# Patient Record
Sex: Female | Born: 1955 | Race: Black or African American | Hispanic: No | Marital: Single | Smoking: Former smoker
Health system: Southern US, Community
[De-identification: ages and names within clinical notes are randomized; demographics above are authoritative.]

## PROBLEM LIST (undated history)

## (undated) DIAGNOSIS — Z9889 Other specified postprocedural states: Secondary | ICD-10-CM

## (undated) DIAGNOSIS — G709 Myoneural disorder, unspecified: Secondary | ICD-10-CM

## (undated) DIAGNOSIS — G43909 Migraine, unspecified, not intractable, without status migrainosus: Secondary | ICD-10-CM

## (undated) DIAGNOSIS — Z9289 Personal history of other medical treatment: Secondary | ICD-10-CM

## (undated) DIAGNOSIS — R351 Nocturia: Secondary | ICD-10-CM

## (undated) DIAGNOSIS — Z860100 Personal history of colon polyps, unspecified: Secondary | ICD-10-CM

## (undated) DIAGNOSIS — K219 Gastro-esophageal reflux disease without esophagitis: Secondary | ICD-10-CM

## (undated) DIAGNOSIS — F419 Anxiety disorder, unspecified: Secondary | ICD-10-CM

## (undated) DIAGNOSIS — Z8669 Personal history of other diseases of the nervous system and sense organs: Secondary | ICD-10-CM

## (undated) DIAGNOSIS — Z8709 Personal history of other diseases of the respiratory system: Secondary | ICD-10-CM

## (undated) DIAGNOSIS — I639 Cerebral infarction, unspecified: Secondary | ICD-10-CM

## (undated) DIAGNOSIS — I10 Essential (primary) hypertension: Secondary | ICD-10-CM

## (undated) DIAGNOSIS — M797 Fibromyalgia: Secondary | ICD-10-CM

## (undated) DIAGNOSIS — M1990 Unspecified osteoarthritis, unspecified site: Secondary | ICD-10-CM

## (undated) DIAGNOSIS — I499 Cardiac arrhythmia, unspecified: Secondary | ICD-10-CM

## (undated) DIAGNOSIS — T8859XA Other complications of anesthesia, initial encounter: Secondary | ICD-10-CM

## (undated) DIAGNOSIS — R112 Nausea with vomiting, unspecified: Secondary | ICD-10-CM

## (undated) DIAGNOSIS — M2550 Pain in unspecified joint: Secondary | ICD-10-CM

## (undated) DIAGNOSIS — E785 Hyperlipidemia, unspecified: Secondary | ICD-10-CM

## (undated) HISTORY — DX: Personal history of other medical treatment: Z92.89

## (undated) HISTORY — DX: Cerebral infarction, unspecified: I63.9

## (undated) HISTORY — PX: COLONOSCOPY: SHX174

## (undated) HISTORY — PX: OTHER SURGICAL HISTORY: SHX169

## (undated) HISTORY — PX: CARPAL TUNNEL RELEASE: SHX101

## (undated) HISTORY — PX: BREAST EXCISIONAL BIOPSY: SUR124

## (undated) HISTORY — DX: Myoneural disorder, unspecified: G70.9

---

## 1995-10-30 HISTORY — PX: ABDOMINAL HYSTERECTOMY: SHX81

## 1996-10-29 HISTORY — PX: CHOLECYSTECTOMY: SHX55

## 1997-10-29 HISTORY — PX: KNEE ARTHROSCOPY: SUR90

## 1998-06-24 ENCOUNTER — Encounter: Payer: Self-pay | Admitting: Emergency Medicine

## 1998-06-24 ENCOUNTER — Emergency Department (HOSPITAL_COMMUNITY): Admission: EM | Admit: 1998-06-24 | Discharge: 1998-06-24 | Payer: Self-pay | Admitting: Emergency Medicine

## 1999-07-04 ENCOUNTER — Ambulatory Visit (HOSPITAL_BASED_OUTPATIENT_CLINIC_OR_DEPARTMENT_OTHER): Admission: RE | Admit: 1999-07-04 | Discharge: 1999-07-04 | Payer: Self-pay | Admitting: General Surgery

## 1999-08-24 ENCOUNTER — Emergency Department (HOSPITAL_COMMUNITY): Admission: EM | Admit: 1999-08-24 | Discharge: 1999-08-24 | Payer: Self-pay | Admitting: Emergency Medicine

## 2000-06-06 ENCOUNTER — Emergency Department (HOSPITAL_COMMUNITY): Admission: EM | Admit: 2000-06-06 | Discharge: 2000-06-06 | Payer: Self-pay | Admitting: Emergency Medicine

## 2000-06-10 ENCOUNTER — Encounter: Admission: RE | Admit: 2000-06-10 | Discharge: 2000-06-10 | Payer: Self-pay | Admitting: Internal Medicine

## 2000-06-10 ENCOUNTER — Encounter: Payer: Self-pay | Admitting: Internal Medicine

## 2001-01-15 ENCOUNTER — Encounter: Admission: RE | Admit: 2001-01-15 | Discharge: 2001-03-13 | Payer: Self-pay | Admitting: Internal Medicine

## 2001-02-07 ENCOUNTER — Encounter: Admission: RE | Admit: 2001-02-07 | Discharge: 2001-02-07 | Payer: Self-pay | Admitting: Internal Medicine

## 2001-02-07 ENCOUNTER — Encounter: Payer: Self-pay | Admitting: Internal Medicine

## 2001-04-09 ENCOUNTER — Encounter: Payer: Self-pay | Admitting: Internal Medicine

## 2001-04-09 ENCOUNTER — Ambulatory Visit (HOSPITAL_COMMUNITY): Admission: RE | Admit: 2001-04-09 | Discharge: 2001-04-09 | Payer: Self-pay | Admitting: Internal Medicine

## 2001-05-14 ENCOUNTER — Encounter: Admission: RE | Admit: 2001-05-14 | Discharge: 2001-05-14 | Payer: Self-pay | Admitting: Neurosurgery

## 2001-05-21 ENCOUNTER — Ambulatory Visit (HOSPITAL_COMMUNITY): Admission: RE | Admit: 2001-05-21 | Discharge: 2001-05-21 | Payer: Self-pay | Admitting: Internal Medicine

## 2001-05-26 ENCOUNTER — Encounter: Admission: RE | Admit: 2001-05-26 | Discharge: 2001-05-26 | Payer: Self-pay | Admitting: Neurosurgery

## 2001-07-25 ENCOUNTER — Emergency Department (HOSPITAL_COMMUNITY): Admission: EM | Admit: 2001-07-25 | Discharge: 2001-07-25 | Payer: Self-pay | Admitting: Emergency Medicine

## 2001-08-12 ENCOUNTER — Encounter: Payer: Self-pay | Admitting: Internal Medicine

## 2001-08-12 ENCOUNTER — Encounter: Admission: RE | Admit: 2001-08-12 | Discharge: 2001-08-12 | Payer: Self-pay | Admitting: Internal Medicine

## 2001-09-04 ENCOUNTER — Encounter: Payer: Self-pay | Admitting: Internal Medicine

## 2001-09-04 ENCOUNTER — Encounter: Admission: RE | Admit: 2001-09-04 | Discharge: 2001-09-04 | Payer: Self-pay | Admitting: Internal Medicine

## 2001-12-02 ENCOUNTER — Encounter: Payer: Self-pay | Admitting: *Deleted

## 2001-12-02 ENCOUNTER — Encounter: Admission: RE | Admit: 2001-12-02 | Discharge: 2001-12-02 | Payer: Self-pay | Admitting: *Deleted

## 2002-04-02 ENCOUNTER — Encounter: Payer: Self-pay | Admitting: Internal Medicine

## 2002-04-02 ENCOUNTER — Encounter: Admission: RE | Admit: 2002-04-02 | Discharge: 2002-04-02 | Payer: Self-pay | Admitting: Internal Medicine

## 2002-04-28 ENCOUNTER — Encounter: Admission: RE | Admit: 2002-04-28 | Discharge: 2002-04-28 | Payer: Self-pay | Admitting: Internal Medicine

## 2002-04-28 ENCOUNTER — Encounter: Payer: Self-pay | Admitting: Internal Medicine

## 2002-04-28 ENCOUNTER — Encounter (INDEPENDENT_AMBULATORY_CARE_PROVIDER_SITE_OTHER): Payer: Self-pay | Admitting: Specialist

## 2002-08-19 ENCOUNTER — Emergency Department (HOSPITAL_COMMUNITY): Admission: EM | Admit: 2002-08-19 | Discharge: 2002-08-19 | Payer: Self-pay | Admitting: Emergency Medicine

## 2003-07-25 ENCOUNTER — Emergency Department (HOSPITAL_COMMUNITY): Admission: EM | Admit: 2003-07-25 | Discharge: 2003-07-25 | Payer: Self-pay | Admitting: Emergency Medicine

## 2003-07-25 ENCOUNTER — Emergency Department (HOSPITAL_COMMUNITY): Admission: EM | Admit: 2003-07-25 | Discharge: 2003-07-25 | Payer: Self-pay

## 2003-09-16 ENCOUNTER — Encounter: Admission: RE | Admit: 2003-09-16 | Discharge: 2003-09-16 | Payer: Self-pay | Admitting: Family Medicine

## 2004-01-26 ENCOUNTER — Ambulatory Visit (HOSPITAL_COMMUNITY): Admission: RE | Admit: 2004-01-26 | Discharge: 2004-01-27 | Payer: Self-pay | Admitting: Neurosurgery

## 2004-02-29 ENCOUNTER — Encounter: Admission: RE | Admit: 2004-02-29 | Discharge: 2004-02-29 | Payer: Self-pay | Admitting: Neurosurgery

## 2004-03-30 ENCOUNTER — Encounter: Admission: RE | Admit: 2004-03-30 | Discharge: 2004-03-30 | Payer: Self-pay | Admitting: Neurosurgery

## 2004-04-13 ENCOUNTER — Encounter: Admission: RE | Admit: 2004-04-13 | Discharge: 2004-07-12 | Payer: Self-pay | Admitting: Neurosurgery

## 2004-06-17 ENCOUNTER — Emergency Department (HOSPITAL_COMMUNITY): Admission: EM | Admit: 2004-06-17 | Discharge: 2004-06-17 | Payer: Self-pay | Admitting: Internal Medicine

## 2004-06-19 ENCOUNTER — Emergency Department (HOSPITAL_COMMUNITY): Admission: EM | Admit: 2004-06-19 | Discharge: 2004-06-19 | Payer: Self-pay | Admitting: Emergency Medicine

## 2004-09-13 ENCOUNTER — Encounter
Admission: RE | Admit: 2004-09-13 | Discharge: 2004-12-12 | Payer: Self-pay | Admitting: Physical Medicine and Rehabilitation

## 2004-09-14 ENCOUNTER — Ambulatory Visit: Payer: Self-pay | Admitting: Physical Medicine and Rehabilitation

## 2004-09-26 ENCOUNTER — Encounter
Admission: RE | Admit: 2004-09-26 | Discharge: 2004-09-29 | Payer: Self-pay | Admitting: Physical Medicine and Rehabilitation

## 2004-10-27 ENCOUNTER — Emergency Department (HOSPITAL_COMMUNITY): Admission: EM | Admit: 2004-10-27 | Discharge: 2004-10-27 | Payer: Self-pay | Admitting: Family Medicine

## 2004-11-17 ENCOUNTER — Ambulatory Visit: Payer: Self-pay | Admitting: Physical Medicine and Rehabilitation

## 2004-12-25 ENCOUNTER — Ambulatory Visit: Payer: Self-pay | Admitting: Internal Medicine

## 2004-12-26 ENCOUNTER — Ambulatory Visit: Payer: Self-pay | Admitting: *Deleted

## 2005-01-03 ENCOUNTER — Encounter
Admission: RE | Admit: 2005-01-03 | Discharge: 2005-04-03 | Payer: Self-pay | Admitting: Physical Medicine and Rehabilitation

## 2005-01-03 ENCOUNTER — Ambulatory Visit (HOSPITAL_COMMUNITY): Admission: RE | Admit: 2005-01-03 | Discharge: 2005-01-03 | Payer: Self-pay | Admitting: Internal Medicine

## 2005-01-15 ENCOUNTER — Ambulatory Visit: Payer: Self-pay | Admitting: Internal Medicine

## 2005-02-10 ENCOUNTER — Emergency Department (HOSPITAL_COMMUNITY): Admission: EM | Admit: 2005-02-10 | Discharge: 2005-02-10 | Payer: Self-pay | Admitting: Family Medicine

## 2005-04-18 ENCOUNTER — Encounter
Admission: RE | Admit: 2005-04-18 | Discharge: 2005-07-17 | Payer: Self-pay | Admitting: Physical Medicine and Rehabilitation

## 2005-08-06 ENCOUNTER — Ambulatory Visit: Payer: Self-pay | Admitting: Internal Medicine

## 2005-08-13 ENCOUNTER — Encounter: Admission: RE | Admit: 2005-08-13 | Discharge: 2005-08-13 | Payer: Self-pay | Admitting: Internal Medicine

## 2005-08-29 ENCOUNTER — Encounter: Admission: RE | Admit: 2005-08-29 | Discharge: 2005-08-29 | Payer: Self-pay | Admitting: Internal Medicine

## 2005-10-29 HISTORY — PX: CERVICAL FUSION: SHX112

## 2005-12-22 ENCOUNTER — Emergency Department (HOSPITAL_COMMUNITY): Admission: EM | Admit: 2005-12-22 | Discharge: 2005-12-22 | Payer: Self-pay | Admitting: Family Medicine

## 2006-02-26 ENCOUNTER — Emergency Department (HOSPITAL_COMMUNITY): Admission: EM | Admit: 2006-02-26 | Discharge: 2006-02-26 | Payer: Self-pay | Admitting: Family Medicine

## 2006-04-08 ENCOUNTER — Emergency Department (HOSPITAL_COMMUNITY): Admission: EM | Admit: 2006-04-08 | Discharge: 2006-04-08 | Payer: Self-pay | Admitting: Family Medicine

## 2006-05-12 ENCOUNTER — Emergency Department (HOSPITAL_COMMUNITY): Admission: EM | Admit: 2006-05-12 | Discharge: 2006-05-12 | Payer: Self-pay | Admitting: Family Medicine

## 2006-10-19 ENCOUNTER — Emergency Department (HOSPITAL_COMMUNITY): Admission: EM | Admit: 2006-10-19 | Discharge: 2006-10-19 | Payer: Self-pay | Admitting: Emergency Medicine

## 2007-06-27 DIAGNOSIS — F17200 Nicotine dependence, unspecified, uncomplicated: Secondary | ICD-10-CM

## 2007-06-27 DIAGNOSIS — R519 Headache, unspecified: Secondary | ICD-10-CM | POA: Insufficient documentation

## 2007-06-27 DIAGNOSIS — R51 Headache: Secondary | ICD-10-CM

## 2007-06-27 HISTORY — DX: Nicotine dependence, unspecified, uncomplicated: F17.200

## 2007-12-28 ENCOUNTER — Emergency Department (HOSPITAL_COMMUNITY): Admission: EM | Admit: 2007-12-28 | Discharge: 2007-12-28 | Payer: Self-pay | Admitting: Family Medicine

## 2008-04-01 ENCOUNTER — Encounter: Admission: RE | Admit: 2008-04-01 | Discharge: 2008-04-01 | Payer: Self-pay | Admitting: Family Medicine

## 2009-05-29 ENCOUNTER — Emergency Department (HOSPITAL_COMMUNITY): Admission: EM | Admit: 2009-05-29 | Discharge: 2009-05-29 | Payer: Self-pay | Admitting: Emergency Medicine

## 2009-09-02 ENCOUNTER — Emergency Department (HOSPITAL_COMMUNITY): Admission: EM | Admit: 2009-09-02 | Discharge: 2009-09-02 | Payer: Self-pay | Admitting: Emergency Medicine

## 2009-10-08 ENCOUNTER — Emergency Department (HOSPITAL_COMMUNITY): Admission: EM | Admit: 2009-10-08 | Discharge: 2009-10-08 | Payer: Self-pay | Admitting: Emergency Medicine

## 2009-10-12 ENCOUNTER — Encounter: Admission: RE | Admit: 2009-10-12 | Discharge: 2009-10-12 | Payer: Self-pay | Admitting: Neurosurgery

## 2009-12-18 ENCOUNTER — Emergency Department (HOSPITAL_COMMUNITY): Admission: EM | Admit: 2009-12-18 | Discharge: 2009-12-18 | Payer: Self-pay | Admitting: Family Medicine

## 2010-06-01 ENCOUNTER — Encounter: Admission: RE | Admit: 2010-06-01 | Discharge: 2010-06-01 | Payer: Self-pay | Admitting: Neurosurgery

## 2010-06-08 ENCOUNTER — Encounter: Admission: RE | Admit: 2010-06-08 | Discharge: 2010-06-08 | Payer: Self-pay | Admitting: Family Medicine

## 2010-06-20 ENCOUNTER — Emergency Department (HOSPITAL_COMMUNITY): Admission: EM | Admit: 2010-06-20 | Discharge: 2010-06-20 | Payer: Self-pay | Admitting: Family Medicine

## 2010-08-11 LAB — HM COLONOSCOPY

## 2010-10-18 ENCOUNTER — Encounter
Admission: RE | Admit: 2010-10-18 | Discharge: 2010-10-18 | Payer: Self-pay | Source: Home / Self Care | Attending: Neurosurgery | Admitting: Neurosurgery

## 2010-11-19 ENCOUNTER — Encounter: Payer: Self-pay | Admitting: Neurosurgery

## 2010-11-19 ENCOUNTER — Encounter: Payer: Self-pay | Admitting: Family Medicine

## 2011-01-11 LAB — POCT RAPID STREP A (OFFICE): Streptococcus, Group A Screen (Direct): NEGATIVE

## 2011-02-20 ENCOUNTER — Other Ambulatory Visit: Payer: Self-pay | Admitting: Family Medicine

## 2011-02-20 DIAGNOSIS — Z09 Encounter for follow-up examination after completed treatment for conditions other than malignant neoplasm: Secondary | ICD-10-CM

## 2011-03-16 NOTE — Assessment & Plan Note (Signed)
MEDICAL RECORD NUMBER:  [id number].   Alicia Bolton is a 55 year old black female who is a patient of Dr. Lonie Peak. She  was last seen September 09, 2004. She has a history of cervical fusion at C5-  6 and C6-7 by Dr. Wynetta Emery in March of 2005, history of migraine, cervicalgia,  right carpal tunnel syndrome, weight gain, insomnia.   She is back today for recheck.   She has had some difficulty filling her medications due to financial issues.  She did get a TENS unit, used it for several weeks, but was unable to keep  it because of financial problems. She admits to depression, feels like she  does not want to go anywhere or do anything. Pain continues to be located in  the cervical and parascapular area, sometimes radiating up to the head.   Her average pain, she has had episodes where it has been a 10 on a scale of  10. Her current pain right now is about a 2. It can completely interfere  with her activity and enjoyment of life. Pain is worse early in the morning.  Sleep is overall poor. Pain is worsened by walking, standing, and some  activity. Improves with rest, heat, ice, and a TENS unit. Has gotten little  relief from the medications although she really has not been on any due to  finances.   FUNCTIONAL STATUS:  She is able to walk about 30 minutes at a time. Does not  need any assistance. Is able to climb stairs and drive a car. Has not been  employed since December 2004. Is independent with all of her ADLs. Does need  some help with household duties. Admits to anxiety. Denies any kind of  suicidal ideation and bowel and bladder control problems. No new changes in  her past medical, social, or family history since last visit.   PHYSICAL EXAMINATION:  Blood pressure 170/96, I discussed this with the  patient that it is fairly high. She needs to be seen by her primary care  physician. Pulse is 84, respirations 18, 100% saturated on room air.   She is an alert and oriented woman. Her affect  is overall bright, somewhat  anxious; however, she is able to rise out of her chair independently. Gait  is normal in the room. She has fairly good range of motion in her lumbar  area. She has slightly decreased rotation to the left. Otherwise, her range  of motion is only mildly limited in all planes. She has good shoulder range  of motion, fully functional. Seated reflexes are 2+ at the biceps, triceps,  brachial radialis; 2+ at the knees and ankles. No clonus. Normal tone is  noted throughout. Good strength is noted in upper and lower extremities. No  focal weakness. No sensory deficits to light touch are noted today.   IMPRESSION:  1.  Status post cervical fusion, C5-6, 6-7, by Dr. Wynetta Emery in 2005.  2.  Cervicalgia.  3.  History of migraine.  4.  History of episode of high blood pressure noted on exam today, 170/96.  5.  Mild weight gain.  6.  Insomnia.   PLAN:  Will set patient up for physical therapy to address postural issues,  education and body mechanics, gentle stabilization program, and some lower  extremity endurance work to help her with weight loss. Strongly recommend  she followup with primary care for her blood pressure that may be  contributing to her headache. She understands and will follow through.  We  discussed diet today as well. We will give her samples of Effexor to address  mild  depression. If she has any problems will call us regarding. Also gave her  prescription for Elavil and will see her back in one month.       DMK/MedQ  D:  11/17/2004 13:12:45  T:  11/17/2004 15:39:02  Job #:  [id number]

## 2011-03-16 NOTE — Op Note (Signed)
NAME:  Alicia Bolton, Alicia Bolton                        ACCOUNT NO.:  [id number]   MEDICAL RECORD NO.:  [id number]                   PATIENT TYPE:  OIB   LOCATION:  NA                                   FACILITY:  [facility]   PHYSICIAN:  Donalee Citrin, M.D.                     DATE OF BIRTH:  19-Nov-1955   DATE OF PROCEDURE:  01/26/2004  DATE OF DISCHARGE:                                 OPERATIVE REPORT   PREOPERATIVE DIAGNOSIS:  Cervical spondylitic radiculopathy, C6 and C7, from  cervical spondylosis with ruptured disk at C5-6 and C6-7 compressing the  right C6 and C7 nerve roots.   PROCEDURE:  Anterior cervical diskectomy and fusion at C5-6 and C6-7 using 7  mm patellar wedges, 40 mm Atlantis plates, 6 x 13 mm variable-angled screws.   SURGEON:  Donalee Citrin, M.D.   ASSISTANT:  Tia Alert, M.D.   ANESTHESIA:  General endotracheal.   HISTORY OF PRESENT ILLNESS:  The patient is a very pleasant 55 year old  female who has had longstanding neck and bilateral arm pain, worse on the  right, radiating through her shoulder down to her fingertips, with numbness  and tingling in her hand.  The patient has been refractory to all forms of  conservative treatment.  The patient's preoperative exam revealed the  patient to have 4 to 4+ weakness in the right triceps.  MRI showed severe  cervical spondylosis with ruptured disk at C5-6 migrating caudally behind  the C6 vertebral body as well as disk bulge with severe spondylosis and  spondylitic compression of the right C7 nerve root and C6-7.  The patient  was recommended to have anterior cervical diskectomy and fusion after  failure of all forms of conservative treatment.  I extensively went over the  risks and benefits of surgery with her.  She understands and agreed to  proceed forward.   The patient was brought in the OR, was induced under general anesthesia,  positioned supine, the neck in slight extension in five pounds of Holter  traction.  The right  side of the neck was prepped and draped in the usual  sterile fashion and preoperative x-ray localized the C6 vertebral body.  A  curvilinear incision was made just off the midline to the anterior border of  the sternocleidomastoid.  The superficial layer of the platysma was  dissected out and divided longitudinally.  The avascular plane between the  sternocleidomastoid and the strap muscles was developed down to the  prevertebral fascia.  The prevertebral fascia was dissected away with  Kitners.  Intraoperative x-ray confirmed localization of the C6-7 disk  space.  Annulotomy was made to mark the disk space and then the longus colli  was reflected laterally and the self-retaining retractor was placed.  Two  self-retaining retractors were placed.  Annulotomy was extended at C6-7 as  well as C5-6.  Using a high-speed drill,  the anterior margin of the annulus  at both levels was drilled down to the posterior nucleus and posterior  annulus.  Then the operating microscope was draped, brought into the field,  and under microscopic illumination first at C5-6, the posterior annulus was  removed and the posterior longitudinal ligament was identified, and this was  removed in piecemeal fashion exposing the thecal sac.  The thecal sac was  noted to be markedly under compression, predominantly right paramedian, from  a large disk herniation that was teased, had migrated subligamentous,  compressing the right C6 nerve root.  This was all removed with Kerrison  punch and a Black nerve hook, and the uncinate process was underbitten and  the proximal C6 neural foramen was widely opened up on the right.  This  procedure was repeated over on the left side, removing the posterior  longitudinal ligament in piecemeal fashion, decompressing the left C6 nerve  root.  The thecal sac was then radically decompressed.  The area behind the  C5 and C6 vertebral body were explored with a blunt nerve hook and noted to   have no further stenosis.  The thecal sac was no longer under compression.  The disk was copiously irrigated and Gelfoam was placed at the C5-6 disk  space.  Then attention was taken to C6-7.  Again the high-speed drill was  used to drill down to the posterior annulus.  Then using  2 and 3 mm  Kerrison punch, the posterior annulus was removed, exposing the posterior  longitudinal ligament.  This was removed in piecemeal fashion.  A large  osteophytic complex was coming off the C6 vertebral body, compressing both  C7 neural foramina, were removed and the uncinate process was underbitten,  decompressing both C7 neural foramina.  Both C7 nerve roots were identified,  explored with a Black nerve hook, and noted to have no further stenosis.  The C6-7 disk space was copiously irrigated.  The BA curettes were used to  scrape both end plates.  At C7 a 7 mm patellar wedge was sized, selected,  and fashioned, 2 mm cut off the depth, and inserted off the vertebral body  distractor had been inserted at C5-6.  It was inserted 1 mm deep to the  anterior vertebral body line.  This procedure was repeated at C6-7.  Both  bone grafts had good apposition to the end plates and were inserted in good  position.  Then a 40 mm Atlantis plate was sized, selected, and contoured,  and six 13 mm variable-angled screws were drilled, tapped, and inserted.  All set screws were tightened, all screws had excellent purchase.  The wound  was copiously irrigated and meticulous hemostasis was maintained.  Postop  fluoroscopy confirmed good position of bone graft, plate, and screws.  Then  the platysma was reapproximated with 3-0 interrupted Vicryl and the skin was  closed in a running 4-0 subcuticular.  Benzoin and Steri-Strips were  applied.  The patient went to recovery in stable condition.  At the end of  the case all needle counts and sponge counts were correct.                                              Donalee Citrin,  M.D.    GC/MEDQ  D:  01/26/2004  T:  01/26/2004  Job:  [id number]

## 2011-03-16 NOTE — Assessment & Plan Note (Signed)
DATE OF VISIT:  January 04, 2005.   MEDICAL RECORD NUMBER:  [id number].   DATE OF BIRTH:  1956-09-19.   Alicia Bolton is a 55 year old black female who is a patient of Dr. Wynetta Emery.  She  is back in for a recheck and refill of her medications.  She was last seen  on November 17, 2004.  Since our last visit, she reports she has had some  problems with her left knee.  She has had a significant episode of swelling,  such that she needed to follow up in the emergency room.  Apparently some x-  rays might have been done and she was referred to an orthopedic surgery on  January 15, 2005.  She reports that she has been getting fair relief with her  medications for her parascapular pain.  Her average pain is about a 3-4 on a  scale of 10.  She describes as tingling, aching and burning.  It is located  between her scapula, radiates somewhat inferiorly and slightly to the mid  scapular area.  The pain is exacerbated by walking, bending and certain  activities.  She is not really sure if it improves with heat, ice,  medications and TENS unit.  Her sleep has been poor lately, but she has not  been taking her amitriptyline.   She has been disabled since September 29, 2003.  She needs assistance getting  out of the bathtub lately due to her left knee problems.  Denies bowel or  bladder control problems.  Denies suicidal ideation.  Does admit to some  mild depression.   No changes in past medical, social or family history since last visit.   PHYSICAL EXAMINATION:  She is a well-developed, well-nourished, black  female.  Does not appear in any distress during our interview.  She is  oriented x 3.  Her affect is overall bright, alert and appropriate.  She has  a normal gait today.  She has no limitations in cervical range of motion.  No shoulder limitations in motion.  She has intact reflexes, symmetric.  Normal tone in the lower extremities.  She does not have any obvious  swelling in the left knee at this point.   She does have some medial joint  line tenderness, some very minimal lateral laxity.  No AP laxity or  instability noted.  She has full extension, but lacks full flexion on the  left side about 10 degrees.   IMPRESSION:  1.  Left knee pain, possibly osteoarthritis, possibly meniscal problem.  2.  Myofascial upper back pain versus mild cervical degenerative disk      disease.   PLAN:  Will refill current medications, including Ultracet one p.o. t.i.d.  p.r.n. #90, amitriptyline 50 mg one p.o. q.h.s. and Lidoderm one to three  patches 12 hours on and 12 off #90.  We will see her back in a month.  She  will follow up as planned with orthopedics for the left knee.      DMK/MedQ  D:  01/04/2005 12:57:00  T:  01/04/2005 14:57:54  Job #:  [id number]

## 2011-03-16 NOTE — Consult Note (Signed)
[place]  Patient:    Alicia Bolton, Alicia Bolton Visit Number: [id number] MRN: [id number]          Service Type: EMS Location: [facility] [place] Attending Physician:  Ilene Qua Dictated by:   Abigail Miyamoto, M.D. Proc. Date: 07/25/01 Admit Date:  07/25/2001                            Consultation Report  CHIEF COMPLAINT:  Perianal pain.  HISTORY OF PRESENT ILLNESS:  Ms. Alicia Bolton is a pleasant female with a history of abscess in the past who developed perianal discomfort earlier this week.  She reports that it has become increasingly worse, but she has had no drainage in this area.  She is otherwise healthy.  PHYSICAL EXAMINATION:  Limited to the perianal area, and she indeed has a moderate size perianal abscess on the right lateral perianal sidewall.  PROCEDURE:  At this point, this area was prepped with Betadine and anesthetized with 1% lidocaine with epinephrine.  Incision was made with a #11 blade, and a moderate amount of purulence was drained from the abscess cavity. The cavity was then packed with dry gauze.  IMPRESSION:  Patient with perianal abscess status post incision and drainage. At this point, she will leave the packing until tomorrow and then will remove this in the shower.  Then she will do sitz baths once a day on the wound.  She will follow up in our office next week if the process does not appear to be resolving. Dictated by:   Abigail Miyamoto, M.D. Attending Physician:  Ilene Qua DD:  07/25/01 TD:  07/25/01 Job: [id number]

## 2011-07-10 ENCOUNTER — Other Ambulatory Visit: Payer: Self-pay | Admitting: Family Medicine

## 2011-07-10 DIAGNOSIS — N63 Unspecified lump in unspecified breast: Secondary | ICD-10-CM

## 2011-07-16 ENCOUNTER — Other Ambulatory Visit: Payer: Self-pay

## 2011-07-20 ENCOUNTER — Ambulatory Visit
Admission: RE | Admit: 2011-07-20 | Discharge: 2011-07-20 | Disposition: A | Discharge status: Home / Self Care | Payer: 59 | Source: Ambulatory Visit | Attending: Family Medicine | Admitting: Family Medicine

## 2011-07-20 DIAGNOSIS — N63 Unspecified lump in unspecified breast: Secondary | ICD-10-CM

## 2011-07-23 LAB — CULTURE, ROUTINE-ABSCESS

## 2011-08-23 ENCOUNTER — Inpatient Hospital Stay (INDEPENDENT_AMBULATORY_CARE_PROVIDER_SITE_OTHER)
Admission: RE | Admit: 2011-08-23 | Discharge: 2011-08-23 | Disposition: A | Discharge status: Home / Self Care | Payer: 59 | Source: Ambulatory Visit | Attending: Emergency Medicine | Admitting: Emergency Medicine

## 2011-08-23 ENCOUNTER — Ambulatory Visit (INDEPENDENT_AMBULATORY_CARE_PROVIDER_SITE_OTHER): Payer: 59

## 2011-08-23 DIAGNOSIS — M542 Cervicalgia: Secondary | ICD-10-CM

## 2012-02-18 ENCOUNTER — Emergency Department (HOSPITAL_COMMUNITY)
Admission: EM | Admit: 2012-02-18 | Discharge: 2012-02-18 | Disposition: A | Discharge status: Home / Self Care | Payer: 59 | Attending: Emergency Medicine | Admitting: Emergency Medicine

## 2012-02-18 ENCOUNTER — Encounter (HOSPITAL_COMMUNITY): Payer: Self-pay | Admitting: *Deleted

## 2012-02-18 DIAGNOSIS — H53149 Visual discomfort, unspecified: Secondary | ICD-10-CM | POA: Insufficient documentation

## 2012-02-18 DIAGNOSIS — R110 Nausea: Secondary | ICD-10-CM | POA: Insufficient documentation

## 2012-02-18 DIAGNOSIS — R209 Unspecified disturbances of skin sensation: Secondary | ICD-10-CM | POA: Insufficient documentation

## 2012-02-18 DIAGNOSIS — I10 Essential (primary) hypertension: Secondary | ICD-10-CM | POA: Insufficient documentation

## 2012-02-18 DIAGNOSIS — Z79899 Other long term (current) drug therapy: Secondary | ICD-10-CM | POA: Insufficient documentation

## 2012-02-18 DIAGNOSIS — F17200 Nicotine dependence, unspecified, uncomplicated: Secondary | ICD-10-CM | POA: Insufficient documentation

## 2012-02-18 DIAGNOSIS — R51 Headache: Secondary | ICD-10-CM

## 2012-02-18 HISTORY — DX: Migraine, unspecified, not intractable, without status migrainosus: G43.909

## 2012-02-18 HISTORY — DX: Essential (primary) hypertension: I10

## 2012-02-18 MED ORDER — SODIUM CHLORIDE 0.9 % IV BOLUS (SEPSIS)
1000.0000 mL | Freq: Once | INTRAVENOUS | Status: AC
  Administered 2012-02-18: 1000 mL via INTRAVENOUS

## 2012-02-18 MED ORDER — DIPHENHYDRAMINE HCL 50 MG/ML IJ SOLN
25.0000 mg | Freq: Once | INTRAMUSCULAR | Status: AC
  Administered 2012-02-18: 25 mg via INTRAVENOUS
  Filled 2012-02-18: qty 1

## 2012-02-18 MED ORDER — KETOROLAC TROMETHAMINE 15 MG/ML IJ SOLN
15.0000 mg | Freq: Once | INTRAMUSCULAR | Status: AC
  Filled 2012-02-18: qty 1

## 2012-02-18 MED ORDER — KETOROLAC TROMETHAMINE 30 MG/ML IJ SOLN
INTRAMUSCULAR | Status: AC
  Administered 2012-02-18: 15 mg
  Filled 2012-02-18: qty 1

## 2012-02-18 MED ORDER — METOCLOPRAMIDE HCL 5 MG/ML IJ SOLN
10.0000 mg | Freq: Once | INTRAMUSCULAR | Status: AC
  Administered 2012-02-18: 10 mg via INTRAVENOUS
  Filled 2012-02-18: qty 2

## 2012-02-18 NOTE — ED Notes (Signed)
Dr Jeraldine Loots notified of pt. Vision changes and numbness in hands.

## 2012-02-18 NOTE — ED Notes (Signed)
Patient reports she had onset of flutters in her vision at 0900.  She states she then developed n/v and chills.  She states her hands are feeling numb, referring to her left hand.  Patient denies chest pain.  She states she also has neck pain.

## 2012-02-18 NOTE — Discharge Instructions (Signed)
Headaches, Frequently Asked Questions MIGRAINE HEADACHES Q: What is migraine? What causes it? How can I treat it? A: Generally, migraine headaches begin as a dull ache. Then they develop into a constant, throbbing, and pulsating pain. You may experience pain at the temples. You may experience pain at the front or back of one or both sides of the head. The pain is usually accompanied by a combination of:  Nausea.   Vomiting.   Sensitivity to light and noise.  Some people (about 15%) experience an aura (see below) before an attack. The cause of migraine is believed to be chemical reactions in the brain. Treatment for migraine may include over-the-counter or prescription medications. It may also include self-help techniques. These include relaxation training and biofeedback.  Q: What is an aura? A: About 15% of people with migraine get an "aura". This is a sign of neurological symptoms that occur before a migraine headache. You may see wavy or jagged lines, dots, or flashing lights. You might experience tunnel vision or blind spots in one or both eyes. The aura can include visual or auditory hallucinations (something imagined). It may include disruptions in smell (such as strange odors), taste or touch. Other symptoms include:  Numbness.   A "pins and needles" sensation.   Difficulty in recalling or speaking the correct word.  These neurological events may last as long as 60 minutes. These symptoms will fade as the headache begins. Q: What is a trigger? A: Certain physical or environmental factors can lead to or "trigger" a migraine. These include:  Foods.   Hormonal changes.   Weather.   Stress.  It is important to remember that triggers are different for everyone. To help prevent migraine attacks, you need to figure out which triggers affect you. Keep a headache diary. This is a good way to track triggers. The diary will help you talk to your healthcare professional about your  condition. Q: Does weather affect migraines? A: Bright sunshine, hot, humid conditions, and drastic changes in barometric pressure may lead to, or "trigger," a migraine attack in some people. But studies have shown that weather does not act as a trigger for everyone with migraines. Q: What is the link between migraine and hormones? A: Hormones start and regulate many of your body's functions. Hormones keep your body in balance within a constantly changing environment. The levels of hormones in your body are unbalanced at times. Examples are during menstruation, pregnancy, or menopause. That can lead to a migraine attack. In fact, about three quarters of all women with migraine report that their attacks are related to the menstrual cycle.  Q: Is there an increased risk of stroke for migraine sufferers? A: The likelihood of a migraine attack causing a stroke is very remote. That is not to say that migraine sufferers cannot have a stroke associated with their migraines. In persons under age 40, the most common associated factor for stroke is migraine headache. But over the course of a person's normal life span, the occurrence of migraine headache may actually be associated with a reduced risk of dying from cerebrovascular disease due to stroke.  Q: What are acute medications for migraine? A: Acute medications are used to treat the pain of the headache after it has started. Examples over-the-counter medications, NSAIDs, ergots, and triptans.  Q: What are the triptans? A: Triptans are the newest class of abortive medications. They are specifically targeted to treat migraine. Triptans are vasoconstrictors. They moderate some chemical reactions in the brain.   The triptans work on receptors in your brain. Triptans help to restore the balance of a neurotransmitter called serotonin. Fluctuations in levels of serotonin are thought to be a main cause of migraine.  Q: Are over-the-counter medications for migraine  effective? A: Over-the-counter, or "OTC," medications may be effective in relieving mild to moderate pain and associated symptoms of migraine. But you should see your caregiver before beginning any treatment regimen for migraine.  Q: What are preventive medications for migraine? A: Preventive medications for migraine are sometimes referred to as "prophylactic" treatments. They are used to reduce the frequency, severity, and length of migraine attacks. Examples of preventive medications include antiepileptic medications, antidepressants, beta-blockers, calcium channel blockers, and NSAIDs (nonsteroidal anti-inflammatory drugs). Q: Why are anticonvulsants used to treat migraine? A: During the past few years, there has been an increased interest in antiepileptic drugs for the prevention of migraine. They are sometimes referred to as "anticonvulsants". Both epilepsy and migraine may be caused by similar reactions in the brain.  Q: Why are antidepressants used to treat migraine? A: Antidepressants are typically used to treat people with depression. They may reduce migraine frequency by regulating chemical levels, such as serotonin, in the brain.  Q: What alternative therapies are used to treat migraine? A: The term "alternative therapies" is often used to describe treatments considered outside the scope of conventional Western medicine. Examples of alternative therapy include acupuncture, acupressure, and yoga. Another common alternative treatment is herbal therapy. Some herbs are believed to relieve headache pain. Always discuss alternative therapies with your caregiver before proceeding. Some herbal products contain arsenic and other toxins. TENSION HEADACHES Q: What is a tension-type headache? What causes it? How can I treat it? A: Tension-type headaches occur randomly. They are often the result of temporary stress, anxiety, fatigue, or anger. Symptoms include soreness in your temples, a tightening  band-like sensation around your head (a "vice-like" ache). Symptoms can also include a pulling feeling, pressure sensations, and contracting head and neck muscles. The headache begins in your forehead, temples, or the back of your head and neck. Treatment for tension-type headache may include over-the-counter or prescription medications. Treatment may also include self-help techniques such as relaxation training and biofeedback. CLUSTER HEADACHES Q: What is a cluster headache? What causes it? How can I treat it? A: Cluster headache gets its name because the attacks come in groups. The pain arrives with little, if any, warning. It is usually on one side of the head. A tearing or bloodshot eye and a runny nose on the same side of the headache may also accompany the pain. Cluster headaches are believed to be caused by chemical reactions in the brain. They have been described as the most severe and intense of any headache type. Treatment for cluster headache includes prescription medication and oxygen. SINUS HEADACHES Q: What is a sinus headache? What causes it? How can I treat it? A: When a cavity in the bones of the face and skull (a sinus) becomes inflamed, the inflammation will cause localized pain. This condition is usually the result of an allergic reaction, a tumor, or an infection. If your headache is caused by a sinus blockage, such as an infection, you will probably have a fever. An x-ray will confirm a sinus blockage. Your caregiver's treatment might include antibiotics for the infection, as well as antihistamines or decongestants.  REBOUND HEADACHES Q: What is a rebound headache? What causes it? How can I treat it? A: A pattern of taking acute headache medications too   often can lead to a condition known as "rebound headache." A pattern of taking too much headache medication includes taking it more than 2 days per week or in excessive amounts. That means more than the label or a caregiver advises.  With rebound headaches, your medications not only stop relieving pain, they actually begin to cause headaches. Doctors treat rebound headache by tapering the medication that is being overused. Sometimes your caregiver will gradually substitute a different type of treatment or medication. Stopping may be a challenge. Regularly overusing a medication increases the potential for serious side effects. Consult a caregiver if you regularly use headache medications more than 2 days per week or more than the label advises. ADDITIONAL QUESTIONS AND ANSWERS Q: What is biofeedback? A: Biofeedback is a self-help treatment. Biofeedback uses special equipment to monitor your body's involuntary physical responses. Biofeedback monitors:  Breathing.   Pulse.   Heart rate.   Temperature.   Muscle tension.   Brain activity.  Biofeedback helps you refine and perfect your relaxation exercises. You learn to control the physical responses that are related to stress. Once the technique has been mastered, you do not need the equipment any more. Q: Are headaches hereditary? A: Four out of five (80%) of people that suffer report a family history of migraine. Scientists are not sure if this is genetic or a family predisposition. Despite the uncertainty, a child has a 50% chance of having migraine if one parent suffers. The child has a 75% chance if both parents suffer.  Q: Can children get headaches? A: By the time they reach high school, most young people have experienced some type of headache. Many safe and effective approaches or medications can prevent a headache from occurring or stop it after it has begun.  Q: What type of doctor should I see to diagnose and treat my headache? A: Start with your primary caregiver. Discuss his or her experience and approach to headaches. Discuss methods of classification, diagnosis, and treatment. Your caregiver may decide to recommend you to a headache specialist, depending upon  your symptoms or other physical conditions. Having diabetes, allergies, etc., may require a more comprehensive and inclusive approach to your headache. The National Headache Foundation will provide, upon request, a list of NHF physician members in your state. Document Released: 01/05/2004 Document Revised: 10/04/2011 Document Reviewed: 06/14/2008 ExitCare Patient Information 2012 [employer].Headache, General, Unknown Cause The specific cause of your headache may not have been found today. There are many causes and types of headache. A few common ones are:  Tension headache.   Migraine.   Infections (examples: dental and sinus infections).   Bone and/or joint problems in the neck or jaw.   Depression.   Eye problems.  These headaches are not life threatening.  Headaches can sometimes be diagnosed by a patient history and a physical exam. Sometimes, lab and imaging studies (such as x-ray and/or CT scan) are used to rule out more serious problems. In some cases, a spinal tap (lumbar puncture) may be requested. There are many times when your exam and tests may be normal on the first visit even when there is a serious problem causing your headaches. Because of that, it is very important to follow up with your doctor or local clinic for further evaluation. FINDING OUT THE RESULTS OF TESTS  If a radiology test was performed, a radiologist will review your results.   You will be contacted by the emergency department or your physician if any test results   require a change in your treatment plan.   Not all test results may be available during your visit. If your test results are not back during the visit, make an appointment with your caregiver to find out the results. Do not assume everything is normal if you have not heard from your caregiver or the medical facility. It is important for you to follow up on all of your test results.  HOME CARE INSTRUCTIONS   Keep follow-up appointments with  your caregiver, or any specialist referral.   Only take over-the-counter or prescription medicines for pain, discomfort, or fever as directed by your caregiver.   Biofeedback, massage, or other relaxation techniques may be helpful.   Ice packs or heat applied to the head and neck can be used. Do this three to four times per day, or as needed.   Call your doctor if you have any questions or concerns.   If you smoke, you should quit.  SEEK MEDICAL CARE IF:   You develop problems with medications prescribed.   You do not respond to or obtain relief from medications.   You have a change from the usual headache.   You develop nausea or vomiting.  SEEK IMMEDIATE MEDICAL CARE IF:   If your headache becomes severe.   You have an unexplained oral temperature above 102 F (38.9 C), or as your caregiver suggests.   You have a stiff neck.   You have loss of vision.   You have muscular weakness.   You have loss of muscular control.   You develop severe symptoms different from your first symptoms.   You start losing your balance or have trouble walking.   You feel faint or pass out.  MAKE SURE YOU:   Understand these instructions.   Will watch your condition.   Will get help right away if you are not doing well or get worse.  Document Released: 10/15/2005 Document Revised: 10/04/2011 Document Reviewed: 06/03/2008 ExitCare Patient Information 2012 [employer].  RESOURCE GUIDE  Dental Problems  Patients with Medicaid: [facility]                     [facility] [address]                                           [address] Phone:  [phone]                                                  Phone:  [phone]  If unable to pay or uninsured, contact:  [facility] or [facility] to become qualified for the adult dental clinic.  Chronic Pain Problems Contact [facility]  [phone] Patients need to  be referred by their primary care doctor.  Insufficient Money for Medicine Contact [employer]:  call "211" or [facility] 271-5999.  No Primary Care Doctor Call [facility]  832-8000 Other agencies that provide inexpensive medical care    [facility]  832-8035    [facility]  [phone]    [facility]  [phone]    Women[facility] Clinic  832-4777    [facility]    [phone]    [facility]  [phone]  Psychological Services [facility]  [phone] [facility]  [phone] [facility]   800 853-5163 (emergency services 641-4993)  Substance Abuse Resources [facility]  336-882-2125 [facility] 336-784-9470 [facility] 336-285-9073 [facility] 336-845-3988 Residential & Outpatient Substance Abuse Program  800-659-3381  Abuse/Neglect [place] Child Abuse Hotline (336) 641-3795 [place] Child Abuse Hotline 800-378-5315 (After Hours)  Emergency Shelter [facility] (336) 271-5985  [facility] (336) 275-9566 [facility] (704) 372-4663  MRSA Hotline #:   832-7006    [place] [facility]  [facility] [place]     [employer]                          [facility] [address] [place]                       [address]      [address]  [place]                                                [place]                            [place] Phone:  [phone]                                   Phone:  [phone]                 Phone:  [phone]  [facility] Phone:  [phone]  [place] Child Abuse Hotline (336) 342-1394 (336) 342-3537 (After Hours)   

## 2012-02-18 NOTE — ED Notes (Signed)
MD at bedside. 

## 2012-02-18 NOTE — ED Notes (Signed)
Pt c/o migraine, states that she "general sickness, chills, and feels bad". Pt. Had vision changes but none currently, also has bilateral hand tingling.

## 2012-02-18 NOTE — ED Provider Notes (Signed)
History    55yF with HA. Gradual onset earlier today while at work. Denies trauma. R sided. Throbbing. Radiation to R neck. No appreciable exacerbating or relieving factors. Associated with nausea and photophobia. Hx of what she calls migraines with similar HAs previously but has been awhile since one this bad. Initially felt like was seeing floaters whic has resolved. Denies change in visual acuity. Tried taking imitrex without relief. No fever. Chills. No neck stiffness. Numbness in hands initially which has resolved. Denies use of blood thinning medications. Denies family hx of cerebral aneurysm.    CSN: [id number]  Arrival date & time 02/18/12  1402   First MD Initiated Contact with Patient 02/18/12 1503      Chief Complaint  Patient presents with  . Migraine    (Consider location/radiation/quality/duration/timing/severity/associated sxs/prior treatment) HPI  Past Medical History  Diagnosis Date  . Hypertension   . Migraines     Past Surgical History  Procedure Date  . Cervical fusion   . Cesarean section   . Abdominal hysterectomy     No family history on file.  History  Substance Use Topics  . Smoking status: Current Some Day Smoker  . Smokeless tobacco: Not on file  . Alcohol Use: Yes    OB History    Grav Para Term Preterm Abortions TAB SAB Ect Mult Living                  Review of Systems  Review of symptoms negative unless otherwise noted in HPI.  Allergies  Floxin and Penicillins  Home Medications   Current Outpatient Rx  Name Route Sig Dispense Refill  . AMLODIPINE BESYLATE 10 MG PO TABS Oral Take 10 mg by mouth daily.    Marland Kitchen CLONIDINE HCL 0.1 MG PO TABS Oral Take 0.1 mg by mouth 2 (two) times daily.    . SUMATRIPTAN SUCCINATE 100 MG PO TABS Oral Take 100 mg by mouth every 2 (two) hours as needed. For migraines      BP 135/82  Pulse 90  Temp(Src) 98.2 F (36.8 C) (Oral)  Resp 16  Ht 5\' 6"  (1.676 m)  Wt 157 lb (71.215 kg)  BMI 25.34  kg/m2  SpO2 94%  Physical Exam  Nursing note and vitals reviewed. Constitutional: She is oriented to person, place, and time. She appears well-developed and well-nourished. No distress.       Sitting up in bed with room lights off. NAD.  HENT:  Head: Normocephalic and atraumatic.  Right Ear: External ear normal.  Left Ear: External ear normal.  Mouth/Throat: Oropharynx is clear and moist.  Eyes: Conjunctivae are normal. Pupils are equal, round, and reactive to light. Right eye exhibits no discharge. Left eye exhibits no discharge.  Neck: Normal range of motion. Neck supple.  Cardiovascular: Normal rate, regular rhythm and normal heart sounds.  Exam reveals no gallop and no friction rub.   No murmur heard. Pulmonary/Chest: Effort normal and breath sounds normal. No respiratory distress.  Abdominal: Soft. She exhibits no distension. There is no tenderness.  Musculoskeletal: She exhibits no edema and no tenderness.  Lymphadenopathy:    She has no cervical adenopathy.  Neurological: She is alert and oriented to person, place, and time. No cranial nerve deficit. She exhibits normal muscle tone. Coordination normal.       Good finger to nose and heel to shin testing b/l  Skin: Skin is warm and dry. She is not diaphoretic.  Psychiatric: She has a normal mood and  affect. Her behavior is normal. Thought content normal.    ED Course  Procedures (including critical care time)  Labs Reviewed - No data to display No results found.   1. Headache       MDM  Suspect primary HA. Consider emergent secondary causes such as bleed, infectious or mass but doubt. There is no history of trauma. Pt has a nonfocal neurological exam. Afebrile and neck supple. No use of blood thinning medication. Consider ocular etiology such as acute angle closure glaucoma but doubt. Pt denies acute change in visual acuity and eye exam unremarkable. Consider temporal arteritis but no temporal tenderness and temporal  artery pulsations palpable. Doubt CO poisoning. No contacts with similar symptoms. Doubt central venous thrombosis. Doubt carotid or vertebral arteries dissection. Symptoms improved with meds. Feel that can be safely discharged, but strict return precautions discussed. Outpt fu.         Raeford Razor, MD 02/18/12 1731

## 2012-04-09 ENCOUNTER — Emergency Department (HOSPITAL_COMMUNITY)
Admission: EM | Admit: 2012-04-09 | Discharge: 2012-04-09 | Disposition: A | Discharge status: Home / Self Care | Payer: 59 | Source: Home / Self Care | Attending: Emergency Medicine | Admitting: Emergency Medicine

## 2012-04-09 ENCOUNTER — Encounter (HOSPITAL_COMMUNITY): Payer: Self-pay | Admitting: Emergency Medicine

## 2012-04-09 DIAGNOSIS — R21 Rash and other nonspecific skin eruption: Secondary | ICD-10-CM

## 2012-04-09 MED ORDER — HYDROXYZINE HCL 25 MG PO TABS: 25.0000 mg | ORAL_TABLET | Freq: Three times a day (TID) | ORAL | Status: AC | PRN

## 2012-04-09 MED ORDER — TRIAMCINOLONE ACETONIDE 0.1 % EX CREA: TOPICAL_CREAM | Freq: Two times a day (BID) | CUTANEOUS | Status: AC

## 2012-04-09 MED ORDER — PREDNISONE 20 MG PO TABS: 40.0000 mg | ORAL_TABLET | Freq: Every day | ORAL | Status: AC

## 2012-04-09 NOTE — ED Provider Notes (Signed)
History     CSN: [id number]  Arrival date & time 04/09/12  1100   First MD Initiated Contact with Patient 04/09/12 1135      Chief Complaint  Patient presents with  . Rash    (Consider location/radiation/quality/duration/timing/severity/associated sxs/prior treatment) HPI Comments: "It's been about 2- days since this rash started. On my arms first in the back of my legs are normal if a insect bit me or what", and live by myself. I notice more of them yesterday some of them look like tiny little blisters have some behind both my years and on my legs and arms. He did try to use nail polish as a was told that they still itch they still bother me.  Patient denies any constitutional symptoms, such as fevers, generalized malaise, headaches, unintentional weight loss or further symptoms. Patient denies any close contact with any person with a similar rash. And lives by herself  Patient is a 56 y.o. female presenting with rash. The history is provided by the patient.  Rash  This is a new problem. The problem has not changed since onset.The problem is associated with nothing. There has been no fever. The rash is present on the back, torso, right arm and left arm. The pain is at a severity of 1/10. The patient is experiencing no pain. Associated symptoms include blisters and itching. Pertinent negatives include no weeping.    Past Medical History  Diagnosis Date  . Hypertension   . Migraines     Past Surgical History  Procedure Date  . Cervical fusion   . Cesarean section   . Abdominal hysterectomy   . Cholecystectomy     No family history on file.  History  Substance Use Topics  . Smoking status: Former Games developer  . Smokeless tobacco: Not on file  . Alcohol Use: Yes    OB History    Grav Para Term Preterm Abortions TAB SAB Ect Mult Living                  Review of Systems  Constitutional: Negative for fever, chills, activity change, appetite change, fatigue and unexpected  weight change.  Skin: Positive for itching and rash. Negative for wound.    Allergies  Floxin and Penicillins  Home Medications   Current Outpatient Rx  Name Route Sig Dispense Refill  . MAGNESIUM SULFATE POWD Does not apply by Does not apply route. Alcohol, cortisone cream, aveeno    . AMLODIPINE BESYLATE 10 MG PO TABS Oral Take 10 mg by mouth daily.    Marland Kitchen CLONIDINE HCL 0.1 MG PO TABS Oral Take 0.1 mg by mouth 2 (two) times daily.    Marland Kitchen HYDROXYZINE HCL 25 MG PO TABS Oral Take 1 tablet (25 mg total) by mouth every 8 (eight) hours as needed for itching. 12 tablet 0  . PREDNISONE 20 MG PO TABS Oral Take 2 tablets (40 mg total) by mouth daily. 2 tablets daily for 5 days 10 tablet 0  . SUMATRIPTAN SUCCINATE 100 MG PO TABS Oral Take 100 mg by mouth every 2 (two) hours as needed. For migraines    . TRIAMCINOLONE ACETONIDE 0.1 % EX CREA Topical Apply topically 2 (two) times daily. 30 g 0    BP 122/75  Pulse 88  Temp 98.1 F (36.7 C) (Oral)  Resp 18  SpO2 98%  Physical Exam  Nursing note and vitals reviewed. Constitutional: She appears well-developed and well-nourished.  Pulmonary/Chest: Effort normal.  Neurological: She is alert.  Skin: Rash noted. Rash is vesicular and urticarial. There is erythema.       ED Course  Procedures (including critical care time)   Labs Reviewed  HERPES SIMPLEX VIRUS CULTURE   No results found.   1. Papular eruption       MDM  Papular vesicular rash disseminated to upper lower extremity some upper torso it reminds me of herpes simplex type infection. symptomatic management for pruritus was discussed with patient patient was given prescription for prednisone and hydroxyzine. Pending viral culture media results. In considering herpes simplex disseminated infection. Patient looks comfortable afebrile no headaches.        Jimmie Molly, MD 04/09/12 1239

## 2012-04-09 NOTE — ED Notes (Addendum)
Red areas, bumps, some with white tops, some appear like blisters. Reports bumps are very sore, then they itch.  Patient reports they itch terribly.  Patient noticed bumps behind knee, then they spread all over body:trunk, arms, behind ears.  Noticed yesterday.  Noticed initially while at the park.

## 2012-04-09 NOTE — Discharge Instructions (Signed)
We will contact you if abnormal test results will require a further treatment

## 2012-04-09 NOTE — ED Notes (Signed)
Provided equipment/culture medium and at bedside during sample collection by dr cjoll.  Sample labeled by this nurse and placed in [facility] lab.

## 2012-04-11 LAB — HERPES SIMPLEX VIRUS CULTURE: Culture: NOT DETECTED

## 2012-04-21 ENCOUNTER — Telehealth (HOSPITAL_COMMUNITY): Payer: Self-pay | Admitting: *Deleted

## 2012-04-21 NOTE — ED Notes (Signed)
Pt. called on VM and said no one called her lab results to her.  States she was here for insect bites but no relief- medicine not helping.  I called pt. back.  Pt. verified x 2 and given results (Herpes culture: No Herpes Simplex virus detected).  I explained that we don't call back negative results.  Pt. states she has even more bites and is no better.  She wants to know what it is. I asked if she has seen any bed bugs.  She said no.  I told her I do not know what it could be.  She should come back and be rechecked. Vassie Moselle 04/21/2012

## 2012-05-27 ENCOUNTER — Emergency Department (HOSPITAL_COMMUNITY)
Admission: EM | Admit: 2012-05-27 | Discharge: 2012-05-27 | Disposition: A | Discharge status: Home / Self Care | Payer: 59 | Source: Home / Self Care | Attending: Emergency Medicine | Admitting: Emergency Medicine

## 2012-05-27 ENCOUNTER — Encounter (HOSPITAL_COMMUNITY): Payer: Self-pay | Admitting: Emergency Medicine

## 2012-05-27 DIAGNOSIS — L0291 Cutaneous abscess, unspecified: Secondary | ICD-10-CM

## 2012-05-27 DIAGNOSIS — L0390 Cellulitis, unspecified: Secondary | ICD-10-CM

## 2012-05-27 MED ORDER — TRAMADOL HCL 50 MG PO TABS: 100.0000 mg | ORAL_TABLET | Freq: Three times a day (TID) | ORAL | Status: AC | PRN

## 2012-05-27 MED ORDER — SULFAMETHOXAZOLE-TMP DS 800-160 MG PO TABS: 2.0000 | ORAL_TABLET | Freq: Two times a day (BID) | ORAL | Status: AC

## 2012-05-27 NOTE — ED Provider Notes (Signed)
Chief Complaint  Patient presents with  . Cyst    History of Present Illness:    Alicia Bolton is a 56 year old female who has had a 2 month history of a cyst in her left axilla that's become inflamed over the past 2-3 days. It's not draining any pus in she's had no fever or chills. She has had abscesses and cysts before but no history of MRSA or diabetes.  Review of Systems:  Other than noted above, the patient denies any of the following symptoms: Systemic:  No fever, chills or sweats. Skin:  No rash or itching.  PMFSH:  Past medical history, family history, social history, meds, and allergies were reviewed.  No history of diabetes or prior history of abscesses or MRSA.  Physical Exam:   Vital signs:  BP 125/82  Pulse 80  Temp 97.4 F (36.3 C) (Oral)  Resp 16  SpO2 100% Skin:  In the left axilla there is a firm, cystic, inflamed area measuring 1 cm. This was not draining any pus and there was no fluctuance.  Skin exam was otherwise normal.  No rash. Ext:  Distal pulses were full, patient has full ROM of all joints.  Procedure:  Verbal informed consent was obtained.  The patient was informed of the risks and benefits of the procedure and understands and accepts.  Identity of the patient was verified verbally and by wristband.   The abscess area described above was prepped with Betadine and alcohol and anesthetized with 3 mL of 2% Xylocaine with epinephrine.  Using a #11 scalpel blade, a singe straight incision was made into the area of fluctulence, yielding a small amount of prurulent drainage. With firm pressure, sebaceous cyst contents were removed. The cyst wall was grasped with a hemostat and dissected out with the scissors. This resulted in a small wound cavity that did not require packing. Routine cultures were obtained.  Blunt dissection was used to break up loculations.  A sterile pressure dressing was applied.  Assessment:  The encounter diagnosis was Abscess.  Plan:   1.  The  following meds were prescribed:   New Prescriptions   SULFAMETHOXAZOLE-TRIMETHOPRIM (BACTRIM DS) 800-160 MG PER TABLET    Take 2 tablets by mouth 2 (two) times daily.   TRAMADOL (ULTRAM) 50 MG TABLET    Take 2 tablets (100 mg total) by mouth every 8 (eight) hours as needed for pain.   2.  The patient was instructed in symptomatic care and handouts were given. 3.  The patient was instructed in wound care.   Reuben Likes, MD 05/27/12 1226

## 2012-05-27 NOTE — ED Notes (Signed)
Reports cyst under left arm for several months, has felt a knot there, sometimes sore , sometimes not sore.  A few days ago "came to a head"

## 2012-05-30 LAB — CULTURE, ROUTINE-ABSCESS

## 2012-06-10 ENCOUNTER — Other Ambulatory Visit: Payer: Self-pay | Admitting: Family Medicine

## 2012-06-10 DIAGNOSIS — Z1231 Encounter for screening mammogram for malignant neoplasm of breast: Secondary | ICD-10-CM

## 2012-07-17 ENCOUNTER — Encounter (HOSPITAL_COMMUNITY): Payer: Self-pay | Admitting: Emergency Medicine

## 2012-07-17 ENCOUNTER — Emergency Department (HOSPITAL_COMMUNITY)
Admission: EM | Admit: 2012-07-17 | Discharge: 2012-07-17 | Discharge status: Against Medical Advice | Payer: 59 | Attending: Emergency Medicine | Admitting: Emergency Medicine

## 2012-07-17 DIAGNOSIS — R51 Headache: Secondary | ICD-10-CM | POA: Insufficient documentation

## 2012-07-17 NOTE — ED Notes (Signed)
H/a x 1 hour and  Feels weak and she could not see out of her left eye and she could not remember thigs. Has all come back now has been seen for these h/a bedore

## 2012-07-17 NOTE — ED Notes (Signed)
states she had an aura for her migraine like before

## 2012-07-17 NOTE — ED Notes (Signed)
Cannot be found

## 2012-07-17 NOTE — ED Notes (Signed)
Charted on the wrong Patient (Error)

## 2012-07-25 ENCOUNTER — Ambulatory Visit: Payer: 59

## 2012-08-07 ENCOUNTER — Ambulatory Visit: Payer: 59 | Admitting: Cardiology

## 2012-08-22 ENCOUNTER — Ambulatory Visit: Payer: 59 | Admitting: Cardiology

## 2012-12-03 ENCOUNTER — Emergency Department (HOSPITAL_COMMUNITY)
Admission: EM | Admit: 2012-12-03 | Discharge: 2012-12-03 | Disposition: A | Discharge status: Home / Self Care | Payer: 59 | Source: Home / Self Care | Attending: Emergency Medicine | Admitting: Emergency Medicine

## 2012-12-03 ENCOUNTER — Encounter (HOSPITAL_COMMUNITY): Payer: Self-pay | Admitting: *Deleted

## 2012-12-03 DIAGNOSIS — J111 Influenza due to unidentified influenza virus with other respiratory manifestations: Secondary | ICD-10-CM

## 2012-12-03 DIAGNOSIS — R6889 Other general symptoms and signs: Secondary | ICD-10-CM

## 2012-12-03 MED ORDER — OSELTAMIVIR PHOSPHATE 75 MG PO CAPS: 75.0000 mg | ORAL_CAPSULE | Freq: Two times a day (BID) | ORAL | Status: AC

## 2012-12-03 MED ORDER — GUAIFENESIN-CODEINE 100-10 MG/5ML PO SYRP: 5.0000 mL | ORAL_SOLUTION | Freq: Three times a day (TID) | ORAL | Status: DC | PRN

## 2012-12-03 NOTE — ED Notes (Signed)
Spoke w Dr Ladon Applebaum, who authorized Rx Tylenol #3, quantity #15, take 1 Q 8 h as needed for pain, caution when taking Rx cough medicine , as increased drowsiness. Called in to [facility], [address] at pt request. Spoke Directly w pharmacist

## 2012-12-03 NOTE — ED Provider Notes (Signed)
History     CSN: [id number]  Arrival date & time 12/03/12  1001   First MD Initiated Contact with Patient 12/03/12 1003      Chief Complaint  Patient presents with  . Generalized Body Aches    (Consider location/radiation/quality/duration/timing/severity/associated sxs/prior treatment) HPI Comments: Patient presents urgent care describing that she's been having bodyaches, cough nasal congestion headaches tactile fevers at home for the last 2 days. " Feels like I was ran over by a truck" " like if I have the flu or something", patient denies any shortness of breath, abdominal pain nausea vomiting or diarrhea. Have taken some over-the-counter decongestants. Describes that now her cough is somewhat productive but occasionally has difficulty bringing phlegm up. As the some yellowish character to her phlegm.  Patient is a 57 y.o. female presenting with cough. The history is provided by the patient.  Cough This is a new problem. The current episode started 2 days ago. The problem occurs constantly. The problem has been gradually worsening. The cough is productive of sputum. Associated symptoms include chills, headaches, rhinorrhea and sore throat. Pertinent negatives include no chest pain, no weight loss, no ear pain, no shortness of breath and no wheezing. The treatment provided no relief. Her past medical history does not include bronchitis, COPD or asthma.    Past Medical History  Diagnosis Date  . Hypertension   . Migraines     Past Surgical History  Procedure Date  . Cervical fusion   . Cesarean section   . Abdominal hysterectomy   . Cholecystectomy     No family history on file.  History  Substance Use Topics  . Smoking status: Former Games developer  . Smokeless tobacco: Not on file  . Alcohol Use: Yes    OB History    Grav Para Term Preterm Abortions TAB SAB Ect Mult Living                  Review of Systems  Constitutional: Positive for fever and chills. Negative for  weight loss, diaphoresis, activity change, fatigue and unexpected weight change.  HENT: Positive for congestion, sore throat and rhinorrhea. Negative for ear pain, facial swelling, neck pain, neck stiffness and postnasal drip.   Respiratory: Positive for cough. Negative for shortness of breath and wheezing.   Cardiovascular: Negative for chest pain and leg swelling.  Skin: Negative for color change, pallor and rash.  Neurological: Positive for headaches. Negative for dizziness.    Allergies  Floxin and Penicillins  Home Medications   Current Outpatient Rx  Name  Route  Sig  Dispense  Refill  . AMLODIPINE BESYLATE 10 MG PO TABS   Oral   Take 10 mg by mouth daily.         Marland Kitchen CLONIDINE HCL 0.1 MG PO TABS   Oral   Take 0.1 mg by mouth 2 (two) times daily.         . GUAIFENESIN-CODEINE 100-10 MG/5ML PO SYRP   Oral   Take 5 mLs by mouth 3 (three) times daily as needed for cough or congestion.   120 mL   0   . MAGNESIUM SULFATE POWD   Does not apply   by Does not apply route. Alcohol, cortisone cream, aveeno         . OSELTAMIVIR PHOSPHATE 75 MG PO CAPS   Oral   Take 1 capsule (75 mg total) by mouth 2 (two) times daily.   10 capsule   0   . SUMATRIPTAN  SUCCINATE 100 MG PO TABS   Oral   Take 100 mg by mouth every 2 (two) hours as needed. For migraines         . TRIAMCINOLONE ACETONIDE 0.1 % EX CREA   Topical   Apply topically 2 (two) times daily.   30 g   0     BP 113/74  Pulse 97  Temp 98 F (36.7 C) (Oral)  Resp 18  Ht 5\' 6"  (1.676 m)  Wt 150 lb (68.04 kg)  BMI 24.21 kg/m2  SpO2 100%  Physical Exam  Nursing note and vitals reviewed. Constitutional: Vital signs are normal. She appears well-developed and well-nourished.  Non-toxic appearance. She does not have a sickly appearance. She does not appear ill. No distress.  HENT:  Head: Normocephalic.  Eyes: Conjunctivae normal are normal. Right eye exhibits no discharge. Left eye exhibits no discharge. No  scleral icterus.  Neck: Neck supple. No JVD present.  Cardiovascular: Normal rate.  Exam reveals no gallop and no friction rub.   No murmur heard. Pulmonary/Chest: Effort normal and breath sounds normal. No respiratory distress. She has no decreased breath sounds. She has no wheezes. She has no rales. She exhibits no tenderness.  Lymphadenopathy:    She has no cervical adenopathy.  Neurological: She is alert.  Skin: No rash noted.    ED Course  Procedures (including critical care time)  Labs Reviewed - No data to display No results found.   1. Influenza-like symptoms       MDM  Patient looks comfortable, afebrile in no respiratory distress with influenza-like symptoms. Patient has been symptomatic for approximately 48 hours. Vision opted to be treated with Tamiflu patient was also prescribed a antitussive cough syrup periods symptoms that will require further evaluation and recheck were discussed. Patient agrees with treatment plan and followup care is also requesting a work note which was granted for today and tomorrow     Jimmie Molly, MD 12/03/12 1050

## 2012-12-03 NOTE — ED Notes (Signed)
Pt    Reports   Symptoms  Of  Body  Aches           Cough          And   Malaise    For  Several  Days      She  Is  Masked  And  Is  In a  Private  Room        She  Is awake  And  Alert  And  Oriented         She  Is  Sitting upriht on  Exam table   Speaking in  Complete  sentances          Skin is  Warm  And  Dry

## 2012-12-03 NOTE — ED Notes (Signed)
Called , left message on answering machine, requesting rx for pain, HA called in to [facility], [address]

## 2012-12-05 ENCOUNTER — Emergency Department (HOSPITAL_COMMUNITY)
Admission: EM | Admit: 2012-12-05 | Discharge: 2012-12-05 | Disposition: A | Discharge status: Home / Self Care | Payer: 59 | Source: Home / Self Care | Attending: Family Medicine | Admitting: Family Medicine

## 2012-12-05 ENCOUNTER — Emergency Department (INDEPENDENT_AMBULATORY_CARE_PROVIDER_SITE_OTHER): Payer: 59

## 2012-12-05 ENCOUNTER — Encounter (HOSPITAL_COMMUNITY): Payer: Self-pay | Admitting: *Deleted

## 2012-12-05 DIAGNOSIS — J111 Influenza due to unidentified influenza virus with other respiratory manifestations: Secondary | ICD-10-CM

## 2012-12-05 MED ORDER — ONDANSETRON 4 MG PO TBDP
4.0000 mg | ORAL_TABLET | Freq: Once | ORAL | Status: AC
  Administered 2012-12-05: 4 mg via ORAL

## 2012-12-05 MED ORDER — ONDANSETRON 4 MG PO TBDP
ORAL_TABLET | ORAL | Status: AC
  Filled 2012-12-05: qty 1

## 2012-12-05 MED ORDER — ONDANSETRON HCL 4 MG PO TABS: 4.0000 mg | ORAL_TABLET | Freq: Four times a day (QID) | ORAL | Status: DC

## 2012-12-05 NOTE — ED Provider Notes (Signed)
History     CSN: [id number]  Arrival date & time 12/05/12  1001   First MD Initiated Contact with Patient 12/05/12 1004      Chief Complaint  Patient presents with  . Influenza    (Consider location/radiation/quality/duration/timing/severity/associated sxs/prior treatment) Patient is a 57 y.o. female presenting with flu symptoms. The history is provided by the patient.  Influenza This is a new problem. The current episode started more than 2 days ago. The problem has been gradually worsening. Associated symptoms comments: N/v/d which pt attributes to the tamiflu, still with aching and cough..    Past Medical History  Diagnosis Date  . Hypertension   . Migraines     Past Surgical History  Procedure Date  . Cervical fusion   . Cesarean section   . Abdominal hysterectomy   . Cholecystectomy     Family History  Problem Relation Age of Onset  . Family history unknown: Yes    History  Substance Use Topics  . Smoking status: Current Every Day Smoker    Types: Cigarettes  . Smokeless tobacco: Not on file  . Alcohol Use: Yes    OB History    Grav Para Term Preterm Abortions TAB SAB Ect Mult Living                  Review of Systems  Constitutional: Positive for fever, chills and fatigue.  HENT: Positive for congestion.   Gastrointestinal: Positive for nausea, vomiting and diarrhea.    Allergies  Floxin and Penicillins  Home Medications   Current Outpatient Rx  Name  Route  Sig  Dispense  Refill  . AMLODIPINE BESYLATE 10 MG PO TABS   Oral   Take 10 mg by mouth daily.         Marland Kitchen CLONIDINE HCL 0.1 MG PO TABS   Oral   Take 0.1 mg by mouth 2 (two) times daily.         . GUAIFENESIN-CODEINE 100-10 MG/5ML PO SYRP   Oral   Take 5 mLs by mouth 3 (three) times daily as needed for cough or congestion.   120 mL   0   . MAGNESIUM SULFATE POWD   Does not apply   by Does not apply route. Alcohol, cortisone cream, aveeno         . ONDANSETRON HCL 4 MG PO  TABS   Oral   Take 1 tablet (4 mg total) by mouth every 6 (six) hours. As needed for n/v   6 tablet   0   . OSELTAMIVIR PHOSPHATE 75 MG PO CAPS   Oral   Take 1 capsule (75 mg total) by mouth 2 (two) times daily.   10 capsule   0   . SUMATRIPTAN SUCCINATE 100 MG PO TABS   Oral   Take 100 mg by mouth every 2 (two) hours as needed. For migraines         . TRIAMCINOLONE ACETONIDE 0.1 % EX CREA   Topical   Apply topically 2 (two) times daily.   30 g   0     BP 129/86  Pulse 97  Temp 98 F (36.7 C) (Oral)  Resp 18  SpO2 98%  Physical Exam  Nursing note and vitals reviewed. Constitutional: She is oriented to person, place, and time. She appears well-developed and well-nourished.  HENT:  Head: Normocephalic.  Right Ear: External ear normal.  Left Ear: External ear normal.  Mouth/Throat: Oropharynx is clear and moist.  Eyes:  Conjunctivae normal are normal. Pupils are equal, round, and reactive to light.  Neck: Normal range of motion. Neck supple.  Cardiovascular: Normal rate, normal heart sounds and intact distal pulses.   Pulmonary/Chest: She has decreased breath sounds. She has rhonchi.  Abdominal: Soft. Bowel sounds are normal. She exhibits no distension and no mass. There is no hepatosplenomegaly. There is generalized tenderness. There is no rigidity, no rebound, no guarding and no CVA tenderness.  Neurological: She is alert and oriented to person, place, and time.  Skin: Skin is warm and dry.    ED Course  Procedures (including critical care time)  Labs Reviewed - No data to display Dg Chest 2 View  12/05/2012  *RADIOLOGY REPORT*  Clinical Data:  Cough and fever.  CHEST - 2 VIEW  Comparison: 10/19/2006  Findings: The heart size and mediastinal contours are within normal limits.  Both lungs are clear.  The visualized skeletal structures are unremarkable.  IMPRESSION: No active disease.   Original Report Authenticated By: Irish Lack, M.D.      1. Influenza-like  illness       MDM  X-rays reviewed and report per radiologist.         Linna Hoff, MD 12/05/12 1106

## 2012-12-05 NOTE — ED Notes (Signed)
Pt reports not feeling better after being seen on Wednesday, vomiting and diarrhea . Dx with flu and bronchitis ( pt is a smoker). Also reports a headache and body aches - needs work note.

## 2013-01-25 ENCOUNTER — Other Ambulatory Visit: Payer: Self-pay | Admitting: Family Medicine

## 2013-02-25 ENCOUNTER — Other Ambulatory Visit: Payer: Self-pay | Admitting: Family Medicine

## 2013-03-04 ENCOUNTER — Ambulatory Visit: Payer: Self-pay | Admitting: Family Medicine

## 2013-03-06 ENCOUNTER — Ambulatory Visit: Payer: Self-pay | Admitting: Family Medicine

## 2013-06-25 ENCOUNTER — Ambulatory Visit (INDEPENDENT_AMBULATORY_CARE_PROVIDER_SITE_OTHER): Payer: 59 | Admitting: Family Medicine

## 2013-06-25 ENCOUNTER — Encounter: Payer: Self-pay | Admitting: Family Medicine

## 2013-06-25 VITALS — BP 136/78 | HR 96 | Temp 98.3°F | Resp 18 | Ht 66.0 in | Wt 159.0 lb

## 2013-06-25 DIAGNOSIS — I10 Essential (primary) hypertension: Secondary | ICD-10-CM

## 2013-06-25 DIAGNOSIS — G609 Hereditary and idiopathic neuropathy, unspecified: Secondary | ICD-10-CM

## 2013-06-25 MED ORDER — AMLODIPINE BESYLATE 10 MG PO TABS: ORAL_TABLET | ORAL | Status: DC

## 2013-06-25 MED ORDER — SUMATRIPTAN SUCCINATE 100 MG PO TABS: ORAL_TABLET | ORAL | Status: DC

## 2013-06-25 MED ORDER — CLONIDINE HCL 0.1 MG PO TABS: 0.1000 mg | ORAL_TABLET | Freq: Two times a day (BID) | ORAL | Status: DC

## 2013-06-25 MED ORDER — VALACYCLOVIR HCL 1 G PO TABS: 2000.0000 mg | ORAL_TABLET | Freq: Two times a day (BID) | ORAL | Status: DC

## 2013-06-25 NOTE — Progress Notes (Signed)
  Subjective:    Patient ID: Alicia Bolton, female    DOB: Dec 07, 1955, 57 y.o.   MRN: [id number]  HPI Patient has hypertension. She's currently taking amlodipine 10 mg by mouth daily and clonidine 0.1 mg by mouth twice a day. She denies any chest pain, shortness of breath, dyspnea on exertion. She continues to smoke. She has migraines for which she takes Imitrex infrequently. 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 She also continues to complain of numbness and tingling in her left arm and left hand. It primarily involves the second third and fourth fingers. He constantly feels numb and tingles. She denies any weakness in grip strength. She's not dropping objects. There are no exacerbating factors. Nerve conduction studies performed in 2012 are normal. Past Medical History  Diagnosis Date  . Hypertension   . Migraines   . Smoker    No current outpatient prescriptions on file prior to visit.   No current facility-administered medications on file prior to visit.   Allergies  Allergen Reactions  . Floxin [Ofloxacin]     Makes sick to stomach and vomit  . Penicillins Rash   History   Social History  . Marital Status: Single    Spouse Name: N/A    Number of Children: N/A  . Years of Education: N/A   Occupational History  . Not on file.   Social History Main Topics  . Smoking status: Current Every Day Smoker    Types: Cigarettes  . Smokeless tobacco: Not on file  . Alcohol Use: Yes  . Drug Use: No  . Sexual Activity:    Other Topics Concern  . Not on file   Social History Narrative  . No narrative on file   Past Surgical History  Procedure Laterality Date  . Cervical fusion    . Cesarean section    . Abdominal hysterectomy    . Cholecystectomy        Review of Systems  All other systems reviewed and are negative.       Objective:   Physical Exam  Vitals reviewed. Constitutional: She is oriented to person, place, and time.   Neck: Neck supple. No thyromegaly present.  Cardiovascular: Normal rate, regular rhythm and normal heart sounds.  Exam reveals no gallop and no friction rub.   No murmur heard. Pulmonary/Chest: Effort normal and breath sounds normal. No respiratory distress. She has no wheezes. She has no rales. She exhibits no tenderness.  Abdominal: Soft. Bowel sounds are normal. She exhibits no distension and no mass. There is no tenderness. There is no rebound and no guarding.  Musculoskeletal: She exhibits no edema.  Lymphadenopathy:    She has no cervical adenopathy.  Neurological: She is alert and oriented to person, place, and time. She has normal reflexes. She displays normal reflexes. No cranial nerve deficit. Coordination normal.   Phalen's sign and Tinel's sign are positive on the patient's left-hand. She has normal grip strength. She has normal reflexes in the upper extremities bilaterally. There is no muscle weakness in the upper extremities.       Assessment & Plan:  1. Unspecified hereditary and idiopathic peripheral neuropathy Check nerve conduction studies/EMG. Obtain CMP, TSH, vitamin B 12 level. - COMPLETE METABOLIC PANEL WITH GFR - TSH - Vitamin B12  2. HTN (hypertension) Pressures currently well controlled. Continue current medications at the present dosages. I recommended smoking cessation.

## 2013-06-26 LAB — COMPLETE METABOLIC PANEL WITH GFR
Albumin: 4.3 g/dL (ref 3.5–5.2)
Alkaline Phosphatase: 91 U/L (ref 39–117)
BUN: 13 mg/dL (ref 6–23)
CO2: 24 mEq/L (ref 19–32)
GFR, Est African American: >89 mL/min
GFR, Est Non African American: >89 mL/min
Glucose, Bld: 94 mg/dL (ref 70–99)
Potassium: 4 mEq/L (ref 3.5–5.3)
Total Bilirubin: 0.4 mg/dL (ref 0.3–1.2)
Total Protein: 7.3 g/dL (ref 6.0–8.3)

## 2013-06-26 LAB — TSH: TSH: 0.68 u[IU]/mL (ref 0.350–4.500)

## 2013-06-30 ENCOUNTER — Telehealth: Payer: Self-pay | Admitting: Family Medicine

## 2013-06-30 MED ORDER — VALACYCLOVIR HCL 500 MG PO TABS: 500.0000 mg | ORAL_TABLET | Freq: Every day | ORAL | Status: DC

## 2013-06-30 NOTE — Telephone Encounter (Signed)
Ok valtrex 500 mg poqday 30 rf 5

## 2013-06-30 NOTE — Telephone Encounter (Signed)
Rx to pharmacy.  Pt is aware.

## 2013-06-30 NOTE — Telephone Encounter (Signed)
Would like to switch to daily Valtrex instead of just prn since cost would be the same for her.  Please let her know.

## 2013-07-17 ENCOUNTER — Ambulatory Visit (INDEPENDENT_AMBULATORY_CARE_PROVIDER_SITE_OTHER): Payer: 59 | Admitting: Neurology

## 2013-07-17 ENCOUNTER — Ambulatory Visit (INDEPENDENT_AMBULATORY_CARE_PROVIDER_SITE_OTHER): Payer: 59

## 2013-07-17 DIAGNOSIS — G5602 Carpal tunnel syndrome, left upper limb: Secondary | ICD-10-CM

## 2013-07-17 DIAGNOSIS — G5600 Carpal tunnel syndrome, unspecified upper limb: Secondary | ICD-10-CM

## 2013-07-17 DIAGNOSIS — G609 Hereditary and idiopathic neuropathy, unspecified: Secondary | ICD-10-CM

## 2013-07-17 DIAGNOSIS — R209 Unspecified disturbances of skin sensation: Secondary | ICD-10-CM

## 2013-07-17 HISTORY — DX: Carpal tunnel syndrome, unspecified upper limb: G56.00

## 2013-07-17 NOTE — Procedures (Signed)
    [facility]  NCS (NERVE CONDUCTION STUDY) WITH EMG (ELECTROMYOGRAPHY) REPORT   STUDY DATE: 07/17/2013 PATIENT NAME: Alicia Bolton DOB: 03-Apr-1956 MRN: [id number]    TECHNOLOGIST: Gearldine Shown ELECTROMYOGRAPHER: Levert Feinstein M.D.  CLINICAL INFORMATION:  57 year old right-handed African American female, with bilateral fingertips paresthesia, left worse than right, right hand carpal tunnel release surgery in 2010, she denies significant neck pain.  On examination: Bilateral hand muscle strength is normal, including bilateral abductor pollicis brevis, opponents, there was no significant sensory loss, deep tendon reflex was present and symmetric    FINDINGS: NERVE CONDUCTION STUDY:  Bilateral ulnar sensory and motor responses were normal. Right median sensory and motor responses were normal. Left median sensory response showed mildly prolonged peak latency, with normal snap amplitude.  Left median motor response showed mildly prolonged distal latency, with normal C. map amplitude, conduction velocity, and mildly prolonged F wave latency   NEEDLE ELECTROMYOGRAPHY:  Selected needle examination was performed at left upper extremity muscles, and left cervical paraspinal muscles   Needle examination of left abductor pollicis brevis, extensor digitorum communis, biceps, triceps, deltoid, pronator teres was normal.  There was no spontaneous activity at the left cervical paraspinal muscles, left C5, C6, C7  IMPRESSION:   This is a mild abnormal study. There is electrodiagnostic evidence of left median neuropathy across the wrist, consistent with a mild to moderate left carpal tunnel syndrome, there was no evidence of left cervical radiculopathy.    INTERPRETING PHYSICIAN:   Levert Feinstein M.D. Ph.D. [facility] [address], [address] [place], [place] [address] [phone]

## 2013-07-20 ENCOUNTER — Encounter (HOSPITAL_COMMUNITY): Payer: Self-pay | Admitting: Emergency Medicine

## 2013-07-20 ENCOUNTER — Emergency Department (HOSPITAL_COMMUNITY)
Admission: EM | Admit: 2013-07-20 | Discharge: 2013-07-20 | Disposition: A | Discharge status: Home / Self Care | Payer: 59 | Source: Home / Self Care | Attending: Family Medicine | Admitting: Family Medicine

## 2013-07-20 DIAGNOSIS — L089 Local infection of the skin and subcutaneous tissue, unspecified: Secondary | ICD-10-CM

## 2013-07-20 DIAGNOSIS — L723 Sebaceous cyst: Secondary | ICD-10-CM

## 2013-07-20 NOTE — ED Notes (Signed)
C/o cyst on left side of groin was seen yesterday.

## 2013-07-20 NOTE — ED Provider Notes (Signed)
Alicia Bolton is a 57 y.o. female who presents to Urgent Care today for abscess left groin. 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 She developed a painful area in her left inguinal crease starting yesterday. He has worsened today. She denies any fevers or chills nausea vomiting or diarrhea. She's tried warm compress which has not helped much. It is not yet draining.   Past Medical History  Diagnosis Date  . Hypertension   . Migraines   . Smoker    History  Substance Use Topics  . Smoking status: Current Every Day Smoker    Types: Cigarettes  . Smokeless tobacco: Not on file  . Alcohol Use: Yes   ROS as above Medications reviewed. No current facility-administered medications for this encounter.   Current Outpatient Prescriptions  Medication Sig Dispense Refill  . amLODipine (NORVASC) 10 MG tablet TAKE ONE TABLET BY MOUTH EVERY DAY  30 tablet  5  . cloNIDine (CATAPRES) 0.1 MG tablet Take 1 tablet (0.1 mg total) by mouth 2 (two) times daily.  60 tablet  5  . SUMAtriptan (IMITREX) 100 MG tablet TAKE ONE TABLET BY MOUTH X1 AS NEEDED  9 tablet  3  . valACYclovir (VALTREX) 500 MG tablet Take 1 tablet (500 mg total) by mouth daily.  30 tablet  5    Exam:  BP 125/80  Pulse 89  Temp(Src) 97.9 F (36.6 C) (Oral)  Resp 18  SpO2 98% Gen: Well NAD SKIN: 2 x 1 cm fluctuant abscess left inguinal crease. No significant surrounding induration. Tender to touch.   Abscess incision and drainage Consent obtained and timeout performed Skin cleaned with alcohol and cold spray applied and 1 mL of 2% lidocaine with epinephrine was injected over the area of fluctuance. 11 blade scalpel was used to cut to the fluctuance and a small amount of sebaceous material was removed.  The pus was cultured.  I attempted to remove the cyst wall but was unable to fully extract it.  A dressing was applied.   Assessment and Plan: 57 y.o. female with infected sebaceous cyst.  Culture  pending.  No antibiotics as drainage should be enough.  Followup with Gen. surgery in a few weeks for definitive removal of the sebaceous cyst.  Discussed warning signs or symptoms. Please see discharge instructions. Patient expresses understanding.      Rodolph Bong, MD 07/20/13 [phone]

## 2013-07-22 NOTE — ED Notes (Signed)
Lab review

## 2013-07-22 NOTE — ED Notes (Signed)
Final report pending

## 2013-07-22 NOTE — ED Notes (Signed)
Attempted to return call from patient, had left message on answering machine

## 2013-07-23 LAB — CULTURE, ROUTINE-ABSCESS: Special Requests: NORMAL

## 2013-07-27 NOTE — ED Notes (Signed)
Abscess culture: Multiple organisms present, none predominant.  No further action needed. Alicia Bolton 07/27/2013

## 2013-07-31 ENCOUNTER — Encounter (INDEPENDENT_AMBULATORY_CARE_PROVIDER_SITE_OTHER): Payer: Self-pay | Admitting: Surgery

## 2013-07-31 ENCOUNTER — Ambulatory Visit (INDEPENDENT_AMBULATORY_CARE_PROVIDER_SITE_OTHER): Payer: 59 | Admitting: Surgery

## 2013-07-31 VITALS — BP 124/80 | HR 88 | Temp 99.1°F | Resp 14 | Ht 66.0 in | Wt 154.8 lb

## 2013-07-31 DIAGNOSIS — L732 Hidradenitis suppurativa: Secondary | ICD-10-CM

## 2013-07-31 NOTE — Progress Notes (Signed)
Patient ID: Alicia Bolton, female   DOB: 1956-10-10, 57 y.o.   MRN: [id number]  Chief Complaint  Patient presents with  . New Evaluation    eval seb cyst in groin area    HPI Alicia Bolton is a 57 y.o. female.  Patient sent at request of Dr. Earma Reading for left groin abscess that was drained one week ago. Concern was for an epidermal inclusion cyst that was not removed. Patient is long-standing history of multiple groin abscesses that come and go and require intermittent drainage. This started over 20 years ago. She does smoke cigarettes. She also has the same problem under both arms but less so than her groin area. She gets multiple bumps in each groin on and off.  There are multiple bumps now but none inflamed.  HPI  Past Medical History  Diagnosis Date  . Hypertension   . Migraines   . Smoker   . Neuromuscular disorder     Past Surgical History  Procedure Laterality Date  . Cervical fusion    . Cesarean section    . Abdominal hysterectomy    . Cholecystectomy      History reviewed. No pertinent family history.  Social History History  Substance Use Topics  . Smoking status: Current Every Day Smoker    Types: Cigarettes  . Smokeless tobacco: Never Used  . Alcohol Use: Yes    Allergies  Allergen Reactions  . Floxin [Ofloxacin]     Makes sick to stomach and vomit  . Penicillins Rash    Current Outpatient Prescriptions  Medication Sig Dispense Refill  . amLODipine (NORVASC) 10 MG tablet TAKE ONE TABLET BY MOUTH EVERY DAY  30 tablet  5  . cloNIDine (CATAPRES) 0.1 MG tablet Take 1 tablet (0.1 mg total) by mouth 2 (two) times daily.  60 tablet  5  . ibuprofen (ADVIL,MOTRIN) 800 MG tablet       . SUMAtriptan (IMITREX) 100 MG tablet TAKE ONE TABLET BY MOUTH X1 AS NEEDED  9 tablet  3   No current facility-administered medications for this visit.    Review of Systems Review of Systems  Constitutional: Negative for fever, chills and unexpected weight change.    HENT: Negative for hearing loss, congestion, sore throat, trouble swallowing and voice change.   Eyes: Negative for visual disturbance.  Respiratory: Negative for cough and wheezing.   Cardiovascular: Negative for chest pain, palpitations and leg swelling.  Gastrointestinal: Negative for nausea, vomiting, abdominal pain, diarrhea, constipation, blood in stool, abdominal distention and anal bleeding.  Genitourinary: Negative for hematuria, vaginal bleeding and difficulty urinating.  Musculoskeletal: Negative for arthralgias.  Skin: Negative for rash and wound.  Neurological: Negative for seizures, syncope and headaches.  Hematological: Negative for adenopathy. Does not bruise/bleed easily.  Psychiatric/Behavioral: Negative for confusion.    Blood pressure 124/80, pulse 88, temperature 99.1 F (37.3 C), temperature source Temporal, resp. rate 14, height 5\' 6"  (1.676 m), weight 154 lb 12.8 oz (70.217 kg).  Physical Exam Physical Exam  Constitutional: She is oriented to person, place, and time. She appears well-developed and well-nourished.  HENT:  Head: Normocephalic and atraumatic.  Eyes: Pupils are equal, round, and reactive to light. No scleral icterus.  Neck: Normal range of motion. Neck supple.  Musculoskeletal: Normal range of motion.  Neurological: She is alert and oriented to person, place, and time.  Skin:     Psychiatric: She has a normal mood and affect. Her behavior is normal. Judgment and thought content  normal.    Data Reviewed Dr Logan Bores notes  Assessment    Hidradenitis of pubic region  Tobacco abuse    Plan    Discussed treatment options with the patient both operative and conservative. She's had this problem for well over 20 years now. Outlined medical therapy which includes Hibiclens soap showers and antibiotics when appropriate as well his incision and drainage. Discussed wide excision with skin grafting is another option. Her symptoms have been  intermittent inserted to be followed conservatively if she wishes. I discussed referral to a Engineer, petroleum. She would like to talk with the plastic surgeon about excision options and reconstruction. She is smoking. Return as needed. Smoking cessation recommended       [name]. 07/31/2013, 9:41 AM

## 2013-07-31 NOTE — Patient Instructions (Signed)
Hidradenitis Suppurativa, Sweat Gland Abscess °Hidradenitis suppurativa is a long lasting (chronic), uncommon disease of the sweat glands. With this, boil-like lumps and scarring develop in the groin, some times under the arms (axillae), and under the breasts. It may also uncommonly occur behind the ears, in the crease of the buttocks, and around the genitals.  °CAUSES  °The cause is from a blocking of the sweat glands. They then become infected. It may cause drainage and odor. It is not contagious. So it cannot be given to someone else. It most often shows up in puberty (about 10 to 56 years of age). But it may happen much later. It is similar to acne which is a disease of the sweat glands. This condition is slightly more common in African-Americans and women. °SYMPTOMS  °· Hidradenitis usually starts as one or more red, tender, swellings in the groin or under the arms (axilla). °· Over a period of hours to days the lesions get larger. They often open to the skin surface, draining clear to yellow-colored fluid. °· The infected area heals with scarring. °DIAGNOSIS  °Your caregiver makes this diagnosis by looking at you. Sometimes cultures (growing germs on plates in the lab) may be taken. This is to see what germ (bacterium) is causing the infection.  °TREATMENT  °· Topical germ killing medicine applied to the skin (antibiotics) are the treatment of choice. Antibiotics taken by mouth (systemic) are sometimes needed when the condition is getting worse or is severe. °· Avoid tight-fitting clothing which traps moisture in. °· Dirt does not cause hidradenitis and it is not caused by poor hygiene. °· Involved areas should be cleaned daily using an antibacterial soap. Some patients find that the liquid form of Lever 2000®, applied to the involved areas as a lotion after bathing, can help reduce the odor related to this condition. °· Sometimes surgery is needed to drain infected areas or remove scarred tissue. Removal of  large amounts of tissue is used only in severe cases. °· Birth control pills may be helpful. °· Oral retinoids (vitamin A derivatives) for 6 to 12 months which are effective for acne may also help this condition. °· Weight loss will improve but not cure hidradenitis. It is made worse by being overweight. But the condition is not caused by being overweight. °· This condition is more common in people who have had acne. °· It may become worse under stress. °There is no medical cure for hidradenitis. It can be controlled, but not cured. The condition usually continues for years with periods of getting worse and getting better (remission). °Document Released: 05/29/2004 Document Revised: 01/07/2012 Document Reviewed: 06/14/2008 °[employer]® Patient Information ©2014 [employer]. ° °

## 2013-08-03 ENCOUNTER — Other Ambulatory Visit (INDEPENDENT_AMBULATORY_CARE_PROVIDER_SITE_OTHER): Payer: Self-pay

## 2013-08-03 ENCOUNTER — Telehealth (INDEPENDENT_AMBULATORY_CARE_PROVIDER_SITE_OTHER): Payer: Self-pay | Admitting: *Deleted

## 2013-08-03 ENCOUNTER — Encounter: Payer: Self-pay | Admitting: Family Medicine

## 2013-08-03 ENCOUNTER — Ambulatory Visit (INDEPENDENT_AMBULATORY_CARE_PROVIDER_SITE_OTHER): Payer: 59 | Admitting: Family Medicine

## 2013-08-03 VITALS — BP 110/80 | HR 86 | Temp 98.1°F | Resp 18 | Ht 66.0 in | Wt 163.0 lb

## 2013-08-03 DIAGNOSIS — L732 Hidradenitis suppurativa: Secondary | ICD-10-CM

## 2013-08-03 DIAGNOSIS — M25561 Pain in right knee: Secondary | ICD-10-CM

## 2013-08-03 DIAGNOSIS — M25569 Pain in unspecified knee: Secondary | ICD-10-CM

## 2013-08-03 MED ORDER — IBUPROFEN 800 MG PO TABS: 800.0000 mg | ORAL_TABLET | Freq: Three times a day (TID) | ORAL | Status: DC | PRN

## 2013-08-03 NOTE — Progress Notes (Signed)
  Subjective:    Patient ID: Alicia Bolton, female    DOB: 1955-11-26, 57 y.o.   MRN: [id number]  HPI Patient presents with one week of knee pain. The pain located in the posterior medial aspect of her right knee. She denies any injury to the knee. She denies any locking in the knee. She denies any laxity in the knee joint. The knee does not feel like it would give way or unstable. She denies any erythema or effusion. History of arthroscopic knee surgery for meniscal tear in the past per her report.   Past Medical History  Diagnosis Date  . Hypertension   . Migraines   . Smoker   . Neuromuscular disorder    Current Outpatient Prescriptions on File Prior to Visit  Medication Sig Dispense Refill  . amLODipine (NORVASC) 10 MG tablet TAKE ONE TABLET BY MOUTH EVERY DAY  30 tablet  5  . cloNIDine (CATAPRES) 0.1 MG tablet Take 1 tablet (0.1 mg total) by mouth 2 (two) times daily.  60 tablet  5  . SUMAtriptan (IMITREX) 100 MG tablet TAKE ONE TABLET BY MOUTH X1 AS NEEDED  9 tablet  3   No current facility-administered medications on file prior to visit.   Allergies  Allergen Reactions  . Floxin [Ofloxacin]     Makes sick to stomach and vomit  . Penicillins Rash   History   Social History  . Marital Status: Single    Spouse Name: N/A    Number of Children: N/A  . Years of Education: N/A   Occupational History  . Not on file.   Social History Main Topics  . Smoking status: Current Every Day Smoker    Types: Cigarettes  . Smokeless tobacco: Never Used  . Alcohol Use: Yes  . Drug Use: No  . Sexual Activity: Not on file   Other Topics Concern  . Not on file   Social History Narrative  . No narrative on file   Past Surgical History  Procedure Laterality Date  . Cervical fusion    . Cesarean section    . Abdominal hysterectomy    . Cholecystectomy        Review of Systems  All other systems reviewed and are negative.       Objective:   Physical Exam  Vitals  reviewed. Cardiovascular: Normal rate and regular rhythm.   Pulmonary/Chest: Effort normal and breath sounds normal.  Musculoskeletal:       Right knee: She exhibits abnormal meniscus. She exhibits normal range of motion, no swelling, no effusion, no erythema, normal alignment, no LCL laxity, normal patellar mobility and no MCL laxity. No MCL and no LCL tenderness noted.          Assessment & Plan:  1. Knee pain, acute, right I suspect arthritis versus possible medial meniscal tear. Begin ibuprofen 800 mg every 8 hours for 2 weeks. Recommended ice 15 minutes twice a day. Recheck in 2 weeks. No better consider Cortizone injection versus MRI. - ibuprofen (ADVIL,MOTRIN) 800 MG tablet; Take 1 tablet (800 mg total) by mouth every 8 (eight) hours as needed for pain.  Dispense: 90 tablet; Refill: 0

## 2013-08-03 NOTE — Telephone Encounter (Signed)
Pt was unable to see Dr. Kelly Splinter on Monday's at the [facility] therefore she has been referred to Dr. Thompson Caul at [facility] with an appt on 08/21/13 at 2:00pm.  Pt is aware and agreeable to this appt.

## 2013-08-03 NOTE — Telephone Encounter (Signed)
LMOM for pt to return my call.  I was calling to inform her of her appt with Dr. Kelly Splinter at [facility] located at [address] on 08/17/13 with an arrival time of 8:45am.

## 2013-08-07 ENCOUNTER — Telehealth: Payer: Self-pay | Admitting: Family Medicine

## 2013-08-07 NOTE — Telephone Encounter (Signed)
Waiting for appt for MRI of knee??

## 2013-08-07 NOTE — Telephone Encounter (Signed)
Pt called left mess needs two week treatment at home, then RTC if not improved

## 2013-08-07 NOTE — Telephone Encounter (Signed)
We were going to try ice, ibuprofen and rest for 2 weeks first I thought?  See ov.

## 2013-08-17 ENCOUNTER — Ambulatory Visit (INDEPENDENT_AMBULATORY_CARE_PROVIDER_SITE_OTHER): Payer: 59 | Admitting: Family Medicine

## 2013-08-17 ENCOUNTER — Encounter: Payer: Self-pay | Admitting: Family Medicine

## 2013-08-17 VITALS — BP 128/80 | HR 100 | Temp 98.0°F | Resp 20 | Wt 163.0 lb

## 2013-08-17 DIAGNOSIS — M25561 Pain in right knee: Secondary | ICD-10-CM

## 2013-08-17 DIAGNOSIS — M25569 Pain in unspecified knee: Secondary | ICD-10-CM

## 2013-08-17 MED ORDER — PREDNISONE 20 MG PO TABS: ORAL_TABLET | ORAL | Status: DC

## 2013-08-17 NOTE — Progress Notes (Signed)
Subjective:    Patient ID: Alicia Bolton, female    DOB: February 22, 1956, 57 y.o.   MRN: [id number]  HPI 08/03/13 Patient presents with one week of knee pain. The pain located in the posterior medial aspect of her right knee. She denies any injury to the knee. She denies any locking in the knee. She denies any laxity in the knee joint. The knee does not feel like it would give way or unstable. She denies any erythema or effusion. History of arthroscopic knee surgery for meniscal tear in the past per her report.  At that time, my plan was: 1. Knee pain, acute, right I suspect arthritis versus possible medial meniscal tear. Begin ibuprofen 800 mg every 8 hours for 2 weeks. Recommended ice 15 minutes twice a day. Recheck in 2 weeks. No better consider Cortizone injection versus MRI. - ibuprofen (ADVIL,MOTRIN) 800 MG tablet; Take 1 tablet (800 mg total) by mouth every 8 (eight) hours as needed for pain.  Dispense: 90 tablet; Refill: 0  08/17/13 Patient returns today stating that her knee hurts worse. She feels like it is locking in her right knee. She continues to have pain in the posterior medial joint line and the anterior medial joint line. She reports episodic effusions. She now states that she has had the pain off and on for 2 months.    Past Medical History  Diagnosis Date  . Hypertension   . Migraines   . Smoker   . Neuromuscular disorder    Current Outpatient Prescriptions on File Prior to Visit  Medication Sig Dispense Refill  . amLODipine (NORVASC) 10 MG tablet TAKE ONE TABLET BY MOUTH EVERY DAY  30 tablet  5  . cloNIDine (CATAPRES) 0.1 MG tablet Take 1 tablet (0.1 mg total) by mouth 2 (two) times daily.  60 tablet  5  . ibuprofen (ADVIL,MOTRIN) 800 MG tablet Take 1 tablet (800 mg total) by mouth every 8 (eight) hours as needed for pain.  90 tablet  0  . SUMAtriptan (IMITREX) 100 MG tablet TAKE ONE TABLET BY MOUTH X1 AS NEEDED  9 tablet  3   No current facility-administered medications  on file prior to visit.   Allergies  Allergen Reactions  . Floxin [Ofloxacin]     Makes sick to stomach and vomit  . Penicillins Rash   History   Social History  . Marital Status: Single    Spouse Name: N/A    Number of Children: N/A  . Years of Education: N/A   Occupational History  . Not on file.   Social History Main Topics  . Smoking status: Current Every Day Smoker    Types: Cigarettes  . Smokeless tobacco: Never Used  . Alcohol Use: Yes  . Drug Use: No  . Sexual Activity: Not on file   Other Topics Concern  . Not on file   Social History Narrative  . No narrative on file   Past Surgical History  Procedure Laterality Date  . Cervical fusion    . Cesarean section    . Abdominal hysterectomy    . Cholecystectomy        Review of Systems  All other systems reviewed and are negative.       Objective:   Physical Exam  Vitals reviewed. Cardiovascular: Normal rate and regular rhythm.   Pulmonary/Chest: Effort normal and breath sounds normal.  Musculoskeletal:       Right knee: She exhibits abnormal meniscus. She exhibits normal range of motion, no  swelling, no effusion, no erythema, normal alignment, no LCL laxity, normal patellar mobility and no MCL laxity. No MCL and no LCL tenderness noted.          Assessment & Plan:  1. Recurrent knee pain, right I offered the patient a cortisone injection. She refused. She wants to pursue MRI of the right knee to evaluate for structural damage inside the knee. In the meantime she is willing to take a prednisone taper pack to calm the inflammation down. Also gave the patient a one-week excuse for "light duty only" at work - predniSONE (DELTASONE) 20 MG tablet; 3 tabs poqday 1-2, 2 tab spoqday 3-4, 1 tab poqday 5-6  Dispense: 12 tablet; Refill: 0 - MR Knee Right Wo Contrast; Future

## 2013-08-22 ENCOUNTER — Other Ambulatory Visit: Payer: 59

## 2013-08-24 ENCOUNTER — Ambulatory Visit
Admission: RE | Admit: 2013-08-24 | Discharge: 2013-08-24 | Disposition: A | Discharge status: Home / Self Care | Payer: 59 | Source: Ambulatory Visit | Attending: Family Medicine | Admitting: Family Medicine

## 2013-08-24 DIAGNOSIS — M25561 Pain in right knee: Secondary | ICD-10-CM

## 2013-08-27 ENCOUNTER — Encounter: Payer: Self-pay | Admitting: Family Medicine

## 2013-08-27 ENCOUNTER — Ambulatory Visit (INDEPENDENT_AMBULATORY_CARE_PROVIDER_SITE_OTHER): Payer: 59 | Admitting: Family Medicine

## 2013-08-27 VITALS — BP 110/74 | HR 82 | Temp 97.9°F | Resp 16 | Ht 66.0 in | Wt 162.0 lb

## 2013-08-27 DIAGNOSIS — IMO0002 Reserved for concepts with insufficient information to code with codable children: Secondary | ICD-10-CM

## 2013-08-27 DIAGNOSIS — S83206S Unspecified tear of unspecified meniscus, current injury, right knee, sequela: Secondary | ICD-10-CM

## 2013-08-27 MED ORDER — AMLODIPINE BESYLATE 10 MG PO TABS: ORAL_TABLET | ORAL | Status: DC

## 2013-08-27 MED ORDER — CLONIDINE HCL 0.1 MG PO TABS: 0.1000 mg | ORAL_TABLET | Freq: Two times a day (BID) | ORAL | Status: DC

## 2013-08-27 NOTE — Progress Notes (Signed)
Subjective:    Patient ID: Alicia Bolton, female    DOB: 1956-08-31, 57 y.o.   MRN: [id number]  HPI 08/03/13 Patient presents with one week of knee pain. The pain located in the posterior medial aspect of her right knee. She denies any injury to the knee. She denies any locking in the knee. She denies any laxity in the knee joint. The knee does not feel like it would give way or unstable. She denies any erythema or effusion. History of arthroscopic knee surgery for meniscal tear in the past per her report.  At that time, my plan was: 1. Knee pain, acute, right I suspect arthritis versus possible medial meniscal tear. Begin ibuprofen 800 mg every 8 hours for 2 weeks. Recommended ice 15 minutes twice a day. Recheck in 2 weeks. No better consider Cortizone injection versus MRI. - ibuprofen (ADVIL,MOTRIN) 800 MG tablet; Take 1 tablet (800 mg total) by mouth every 8 (eight) hours as needed for pain.  Dispense: 90 tablet; Refill: 0  08/17/13 Patient returns today stating that her knee hurts worse. She feels like it is locking in her right knee. She continues to have pain in the posterior medial joint line and the anterior medial joint line. She reports episodic effusions. She now states that she has had the pain off and on for 2 months.  At that time, my plan was: 1. Recurrent knee pain, right I offered the patient a cortisone injection. She refused. She wants to pursue MRI of the right knee to evaluate for structural damage inside the knee. In the meantime she is willing to take a prednisone taper pack to calm the inflammation down. Also gave the patient a one-week excuse for "light duty only" at work - predniSONE (DELTASONE) 20 MG tablet; 3 tabs poqday 1-2, 2 tab spoqday 3-4, 1 tab poqday 5-6  Dispense: 12 tablet; Refill: 0 - MR Knee Right Wo Contrast; Future  08/27/13 Patient here today for follow up.  Mri revealed: IMPRESSION:  1. Irregular degenerative tear of the posterior horn and posterior   body of the medial meniscus as described. No displaced meniscal  fragment identified.  2. 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 No acute osteochondral findings or loose bodies  identified.  3. Small septated Baker's cyst.  4. Intact lateral meniscus, cruciate and collateral ligaments.   Patient continued to have pain in the medial aspect of her right knee. 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 Therefore she does not feel that conservative therapy is a long-term option. At present she is on light duty but this not to be something that could be sustainable indefinitely.     Past Medical History  Diagnosis Date  . Hypertension   . Migraines   . Smoker   . Neuromuscular disorder    Current Outpatient Prescriptions on File Prior to Visit  Medication Sig Dispense Refill  . amLODipine (NORVASC) 10 MG tablet TAKE ONE TABLET BY MOUTH EVERY DAY  30 tablet  5  . cloNIDine (CATAPRES) 0.1 MG tablet Take 1 tablet (0.1 mg total) by mouth 2 (two) times daily.  60 tablet  5  . ibuprofen (ADVIL,MOTRIN) 800 MG tablet Take 1 tablet (800 mg total) by mouth every 8 (eight) hours as needed for pain.  90 tablet  0  . predniSONE (DELTASONE) 20 MG tablet 3 tabs poqday 1-2, 2 tab spoqday 3-4, 1 tab poqday 5-6  12 tablet  0  . SUMAtriptan (IMITREX) 100 MG tablet TAKE ONE TABLET BY MOUTH X1 AS NEEDED  9 tablet  3   No current facility-administered medications on file prior to visit.   Allergies  Allergen Reactions  . Floxin [Ofloxacin]     Makes sick to stomach and vomit  . Penicillins Rash   History   Social History  . Marital Status: Single    Spouse Name: N/A    Number of Children: N/A  . Years of Education: N/A   Occupational History  . Not on file.   Social History Main Topics  . Smoking status: Current Every Day Smoker    Types: Cigarettes  . Smokeless tobacco: Never Used  .  Alcohol Use: Yes  . Drug Use: No  . Sexual Activity: Not on file   Other Topics Concern  . Not on file   Social History Narrative  . No narrative on file   Past Surgical History  Procedure Laterality Date  . Cervical fusion    . Cesarean section    . Abdominal hysterectomy    . Cholecystectomy        Review of Systems  All other systems reviewed and are negative.       Objective:   Physical Exam  Vitals reviewed. Cardiovascular: Normal rate and regular rhythm.   Pulmonary/Chest: Effort normal and breath sounds normal.  Musculoskeletal:       Right knee: She exhibits abnormal meniscus. She exhibits normal range of motion, no swelling, no effusion, no erythema, normal alignment, no LCL laxity, normal patellar mobility and no MCL laxity. No MCL and no LCL tenderness noted.          Assessment & Plan:   1. Acute meniscal tear of knee, right, sequela Consult orthopedic surgery. The patient is interested in arthroscopic surgery for meniscal tear. She's not interested in a cortisone injection today.. - Ambulatory referral to Orthopedic Surgery

## 2013-09-15 ENCOUNTER — Ambulatory Visit: Payer: 59 | Admitting: Family Medicine

## 2013-10-01 ENCOUNTER — Other Ambulatory Visit: Payer: Self-pay | Admitting: Orthopedic Surgery

## 2013-10-06 ENCOUNTER — Encounter (HOSPITAL_BASED_OUTPATIENT_CLINIC_OR_DEPARTMENT_OTHER): Payer: Self-pay | Admitting: *Deleted

## 2013-10-06 NOTE — Progress Notes (Signed)
To come in for [id number]  

## 2013-10-08 ENCOUNTER — Encounter (HOSPITAL_BASED_OUTPATIENT_CLINIC_OR_DEPARTMENT_OTHER)
Admission: RE | Admit: 2013-10-08 | Discharge: 2013-10-08 | Disposition: A | Discharge status: Home / Self Care | Payer: 59 | Source: Ambulatory Visit | Attending: Orthopedic Surgery | Admitting: Orthopedic Surgery

## 2013-10-08 LAB — BASIC METABOLIC PANEL
BUN: 9 mg/dL (ref 6–23)
Calcium: 9.3 mg/dL (ref 8.4–10.5)
Creatinine, Ser: 0.7 mg/dL (ref 0.50–1.10)
GFR calc Af Amer: >90 mL/min (ref >90)
GFR calc non Af Amer: >90 mL/min (ref >90)
Glucose, Bld: 78 mg/dL (ref 70–99)

## 2013-10-09 ENCOUNTER — Encounter (HOSPITAL_BASED_OUTPATIENT_CLINIC_OR_DEPARTMENT_OTHER)
Admission: RE | Disposition: A | Discharge status: Home / Self Care | Payer: Self-pay | Source: Ambulatory Visit | Attending: Orthopedic Surgery

## 2013-10-09 ENCOUNTER — Encounter (HOSPITAL_BASED_OUTPATIENT_CLINIC_OR_DEPARTMENT_OTHER): Payer: 59 | Admitting: Anesthesiology

## 2013-10-09 ENCOUNTER — Ambulatory Visit (HOSPITAL_BASED_OUTPATIENT_CLINIC_OR_DEPARTMENT_OTHER): Payer: 59 | Admitting: Anesthesiology

## 2013-10-09 ENCOUNTER — Ambulatory Visit (HOSPITAL_BASED_OUTPATIENT_CLINIC_OR_DEPARTMENT_OTHER)
Admission: RE | Admit: 2013-10-09 | Discharge: 2013-10-09 | Disposition: A | Discharge status: Home / Self Care | Payer: 59 | Source: Ambulatory Visit | Attending: Orthopedic Surgery | Admitting: Orthopedic Surgery

## 2013-10-09 ENCOUNTER — Encounter (HOSPITAL_BASED_OUTPATIENT_CLINIC_OR_DEPARTMENT_OTHER): Payer: Self-pay

## 2013-10-09 DIAGNOSIS — Z79899 Other long term (current) drug therapy: Secondary | ICD-10-CM | POA: Insufficient documentation

## 2013-10-09 DIAGNOSIS — Z01812 Encounter for preprocedural laboratory examination: Secondary | ICD-10-CM | POA: Insufficient documentation

## 2013-10-09 DIAGNOSIS — G43909 Migraine, unspecified, not intractable, without status migrainosus: Secondary | ICD-10-CM | POA: Insufficient documentation

## 2013-10-09 DIAGNOSIS — X58XXXA Exposure to other specified factors, initial encounter: Secondary | ICD-10-CM | POA: Insufficient documentation

## 2013-10-09 DIAGNOSIS — Z87891 Personal history of nicotine dependence: Secondary | ICD-10-CM | POA: Insufficient documentation

## 2013-10-09 DIAGNOSIS — M2240 Chondromalacia patellae, unspecified knee: Secondary | ICD-10-CM | POA: Insufficient documentation

## 2013-10-09 DIAGNOSIS — I10 Essential (primary) hypertension: Secondary | ICD-10-CM | POA: Insufficient documentation

## 2013-10-09 DIAGNOSIS — S83241A Other tear of medial meniscus, current injury, right knee, initial encounter: Secondary | ICD-10-CM | POA: Diagnosis present

## 2013-10-09 DIAGNOSIS — Z01810 Encounter for preprocedural cardiovascular examination: Secondary | ICD-10-CM | POA: Insufficient documentation

## 2013-10-09 DIAGNOSIS — IMO0002 Reserved for concepts with insufficient information to code with codable children: Secondary | ICD-10-CM | POA: Insufficient documentation

## 2013-10-09 HISTORY — DX: Other specified postprocedural states: R11.2

## 2013-10-09 HISTORY — DX: Other tear of medial meniscus, current injury, right knee, initial encounter: S83.241A

## 2013-10-09 HISTORY — PX: KNEE ARTHROSCOPY WITH MEDIAL MENISECTOMY: SHX5651

## 2013-10-09 HISTORY — DX: Other specified postprocedural states: Z98.890

## 2013-10-09 SURGERY — ARTHROSCOPY, KNEE, WITH MEDIAL MENISCECTOMY
Anesthesia: General | Site: Knee | Laterality: Right

## 2013-10-09 MED ORDER — MIDAZOLAM HCL 2 MG/2ML IJ SOLN: 1.0000 mg | INTRAMUSCULAR | Status: DC | PRN

## 2013-10-09 MED ORDER — PROPOFOL 10 MG/ML IV BOLUS
INTRAVENOUS | Status: AC
  Filled 2013-10-09: qty 20

## 2013-10-09 MED ORDER — MIDAZOLAM HCL 5 MG/5ML IJ SOLN
INTRAMUSCULAR | Status: DC | PRN
  Administered 2013-10-09: 2 mg via INTRAVENOUS

## 2013-10-09 MED ORDER — LACTATED RINGERS IV SOLN
INTRAVENOUS | Status: DC
  Administered 2013-10-09: 09:00:00 via INTRAVENOUS

## 2013-10-09 MED ORDER — OXYCODONE HCL 5 MG/5ML PO SOLN: 5.0000 mg | Freq: Once | ORAL | Status: DC | PRN

## 2013-10-09 MED ORDER — BUPIVACAINE HCL (PF) 0.5 % IJ SOLN
INTRAMUSCULAR | Status: DC | PRN
  Administered 2013-10-09: 20 mL

## 2013-10-09 MED ORDER — FENTANYL CITRATE 0.05 MG/ML IJ SOLN
INTRAMUSCULAR | Status: AC
  Filled 2013-10-09: qty 4

## 2013-10-09 MED ORDER — METHOCARBAMOL 500 MG PO TABS: 500.0000 mg | ORAL_TABLET | Freq: Four times a day (QID) | ORAL | Status: DC

## 2013-10-09 MED ORDER — HYDROMORPHONE HCL PF 1 MG/ML IJ SOLN
INTRAMUSCULAR | Status: AC
  Filled 2013-10-09: qty 1

## 2013-10-09 MED ORDER — BUPIVACAINE HCL (PF) 0.5 % IJ SOLN
INTRAMUSCULAR | Status: AC
  Filled 2013-10-09: qty 30

## 2013-10-09 MED ORDER — PROPOFOL 10 MG/ML IV BOLUS
INTRAVENOUS | Status: DC | PRN
  Administered 2013-10-09: 160 mg via INTRAVENOUS

## 2013-10-09 MED ORDER — CLINDAMYCIN PHOSPHATE 900 MG/50ML IV SOLN
900.0000 mg | INTRAVENOUS | Status: AC
  Administered 2013-10-09: 900 mg via INTRAVENOUS

## 2013-10-09 MED ORDER — FENTANYL CITRATE 0.05 MG/ML IJ SOLN
INTRAMUSCULAR | Status: AC
  Filled 2013-10-09: qty 2

## 2013-10-09 MED ORDER — HYDROMORPHONE HCL PF 1 MG/ML IJ SOLN
0.2500 mg | INTRAMUSCULAR | Status: DC | PRN
  Administered 2013-10-09 (×3): 0.5 mg via INTRAVENOUS

## 2013-10-09 MED ORDER — FENTANYL CITRATE 0.05 MG/ML IJ SOLN
INTRAMUSCULAR | Status: DC | PRN
  Administered 2013-10-09 (×4): 50 ug via INTRAVENOUS
  Administered 2013-10-09: 100 ug via INTRAVENOUS

## 2013-10-09 MED ORDER — OXYCODONE-ACETAMINOPHEN 10-325 MG PO TABS: 1.0000 | ORAL_TABLET | Freq: Four times a day (QID) | ORAL | Status: DC | PRN

## 2013-10-09 MED ORDER — PROMETHAZINE HCL 25 MG PO TABS: 25.0000 mg | ORAL_TABLET | Freq: Four times a day (QID) | ORAL | Status: DC | PRN

## 2013-10-09 MED ORDER — METOCLOPRAMIDE HCL 5 MG/ML IJ SOLN: 10.0000 mg | Freq: Once | INTRAMUSCULAR | Status: DC | PRN

## 2013-10-09 MED ORDER — MIDAZOLAM HCL 2 MG/2ML IJ SOLN
INTRAMUSCULAR | Status: AC
  Filled 2013-10-09: qty 2

## 2013-10-09 MED ORDER — DEXAMETHASONE SODIUM PHOSPHATE 4 MG/ML IJ SOLN
INTRAMUSCULAR | Status: DC | PRN
  Administered 2013-10-09: 10 mg via INTRAVENOUS

## 2013-10-09 MED ORDER — FENTANYL CITRATE 0.05 MG/ML IJ SOLN: 50.0000 ug | INTRAMUSCULAR | Status: DC | PRN

## 2013-10-09 MED ORDER — KETOROLAC TROMETHAMINE 30 MG/ML IJ SOLN
INTRAMUSCULAR | Status: DC | PRN
  Administered 2013-10-09: 30 mg via INTRAVENOUS

## 2013-10-09 MED ORDER — SODIUM CHLORIDE 0.9 % IR SOLN
Status: DC | PRN
  Administered 2013-10-09: 2000 mL

## 2013-10-09 MED ORDER — CLINDAMYCIN PHOSPHATE 900 MG/50ML IV SOLN
INTRAVENOUS | Status: AC
  Filled 2013-10-09: qty 50

## 2013-10-09 MED ORDER — LIDOCAINE HCL (CARDIAC) 20 MG/ML IV SOLN
INTRAVENOUS | Status: DC | PRN
  Administered 2013-10-09: 60 mg via INTRAVENOUS

## 2013-10-09 MED ORDER — OXYCODONE HCL 5 MG PO TABS: 5.0000 mg | ORAL_TABLET | Freq: Once | ORAL | Status: DC | PRN

## 2013-10-09 SURGICAL SUPPLY — 41 items
APL SKNCLS STERI-STRIP NONHPOA (GAUZE/BANDAGES/DRESSINGS) ×1
BANDAGE ELASTIC 6 VELCRO ST LF (GAUZE/BANDAGES/DRESSINGS) ×2 IMPLANT
BANDAGE ESMARK 6X9 LF (GAUZE/BANDAGES/DRESSINGS) IMPLANT
BENZOIN TINCTURE PRP APPL 2/3 (GAUZE/BANDAGES/DRESSINGS) ×2 IMPLANT
BLADE CUTTER GATOR 3.5 (BLADE) ×2 IMPLANT
BNDG CMPR 9X6 STRL LF SNTH (GAUZE/BANDAGES/DRESSINGS)
BNDG ESMARK 6X9 LF (GAUZE/BANDAGES/DRESSINGS)
CANISTER SUCT 3000ML (MISCELLANEOUS) IMPLANT
CANISTER SUCT LVC 12 LTR MEDI- (MISCELLANEOUS) ×2 IMPLANT
CUFF TOURNIQUET SINGLE 34IN LL (TOURNIQUET CUFF) IMPLANT
CUTTER KNOT PUSHER 2-0 FIBERWI (INSTRUMENTS) IMPLANT
CUTTER MENISCUS  4.2MM (BLADE)
CUTTER MENISCUS 4.2MM (BLADE) IMPLANT
DRAPE ARTHROSCOPY W/POUCH 90 (DRAPES) ×2 IMPLANT
DURAPREP 26ML APPLICATOR (WOUND CARE) ×2 IMPLANT
ELECT MENISCUS 165MM 90D (ELECTRODE) IMPLANT
ELECT REM PT RETURN 9FT ADLT (ELECTROSURGICAL)
ELECTRODE REM PT RTRN 9FT ADLT (ELECTROSURGICAL) IMPLANT
GLOVE BIO SURGEON STRL SZ 6.5 (GLOVE) ×1 IMPLANT
GLOVE BIO SURGEON STRL SZ8 (GLOVE) ×2 IMPLANT
GLOVE BIOGEL PI IND STRL 7.0 (GLOVE) IMPLANT
GLOVE BIOGEL PI IND STRL 8 (GLOVE) ×2 IMPLANT
GLOVE BIOGEL PI INDICATOR 7.0 (GLOVE) ×1
GLOVE BIOGEL PI INDICATOR 8 (GLOVE) ×2
GLOVE ORTHO TXT STRL SZ7.5 (GLOVE) ×2 IMPLANT
GOWN BRE IMP PREV XXLGXLNG (GOWN DISPOSABLE) ×4 IMPLANT
HOLDER KNEE FOAM BLUE (MISCELLANEOUS) ×2 IMPLANT
KNEE WRAP E Z 3 GEL PACK (MISCELLANEOUS) ×2 IMPLANT
PACK ARTHROSCOPY DSU (CUSTOM PROCEDURE TRAY) ×2 IMPLANT
PACK BASIN DAY SURGERY FS (CUSTOM PROCEDURE TRAY) ×2 IMPLANT
PENCIL BUTTON HOLSTER BLD 10FT (ELECTRODE) IMPLANT
SET ARTHROSCOPY TUBING (MISCELLANEOUS) ×2
SET ARTHROSCOPY TUBING LN (MISCELLANEOUS) ×1 IMPLANT
SLEEVE SCD COMPRESS KNEE MED (MISCELLANEOUS) ×1 IMPLANT
SPONGE GAUZE 4X4 12PLY (GAUZE/BANDAGES/DRESSINGS) ×2 IMPLANT
STRIP CLOSURE SKIN 1/2X4 (GAUZE/BANDAGES/DRESSINGS) ×2 IMPLANT
SUT MNCRL AB 4-0 PS2 18 (SUTURE) ×2 IMPLANT
TOWEL OR 17X24 6PK STRL BLUE (TOWEL DISPOSABLE) ×2 IMPLANT
TOWEL OR NON WOVEN STRL DISP B (DISPOSABLE) ×2 IMPLANT
WAND STAR VAC 90 (SURGICAL WAND) IMPLANT
WATER STERILE IRR 1000ML POUR (IV SOLUTION) ×2 IMPLANT

## 2013-10-09 NOTE — Anesthesia Procedure Notes (Signed)
Procedure Name: LMA Insertion Date/Time: 10/09/2013 9:24 AM Performed by: Burna Cash Pre-anesthesia Checklist: Patient identified, Emergency Drugs available, Suction available and Patient being monitored Patient Re-evaluated:Patient Re-evaluated prior to inductionOxygen Delivery Method: Circle System Utilized Preoxygenation: Pre-oxygenation with 100% oxygen Intubation Type: IV induction Ventilation: Mask ventilation without difficulty LMA: LMA inserted LMA Size: 4.0 Number of attempts: 1 Airway Equipment and Method: bite block Placement Confirmation: positive ETCO2 Tube secured with: Tape Dental Injury: Teeth and Oropharynx as per pre-operative assessment

## 2013-10-09 NOTE — Op Note (Signed)
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  ANESTHESIA:   General  PREOPERATIVE INDICATIONS:  Alicia Bolton is a  57 y.o. female with a diagnosis of RIGHT KNEE medial meniscus tear who failed conservative measures and elected for surgical management.    The risks benefits and alternatives were discussed with the patient preoperatively including but not limited to the risks of infection, bleeding, nerve injury, cardiopulmonary complications, the need for revision surgery, among others, and the patient was willing to proceed.  OPERATIVE IMPLANTS: None  OPERATIVE FINDINGS: Complex posterior horn medial meniscus tear extending around to the body, with an axial component, as well as complex radial component extending almost to the capsule. The medial articular cartilage was in reasonably good condition, some mild grade 2 changes on the femoral side, at the most medial aspect, and there was also some grade 2 changes on the femoral trochlea. The patella was intact. The lateral compartment had normal architecture. The anterior cruciate ligament and PCL were intact and the lateral compartment had intact lateral meniscus.  OPERATIVE PROCEDURE: The patient was brought to the operating room and placed in the supine position. General anesthesia was administered. IV antibiotics were given. The right lower extremity was prepped and draped in usual sterile fashion. Time out was performed.  Diagnostic arthroscopy was carried out the above-named findings using 2 inferior portals. The arthroscopic shaver and the arthroscopic biter was used to debride back the medial meniscus back to a stable configuration. The anterior horn was left intact, although it was probably 70% of the mid substance of the body and posterior horn that had to be removed.  I used the shaver to debride the chondral frayed edges of the femoral trochlea, and then completed the diagnostic arthroscopy, remove the arthroscopic instruments, drained the knee, injected it with Marcaine, and repaired the portals with Monocryl followed by Steri-Strips and sterile gauze. She was awakened and returned to the PACU in stable and satisfactory condition. There were no complications and she tolerated the procedure well.

## 2013-10-09 NOTE — Anesthesia Postprocedure Evaluation (Signed)
Anesthesia Post Note  Patient: Alicia Bolton  Procedure(s) Performed: Procedure(s) (LRB): RIGHT KNEE ARTHROSCOPY WITH MEDIAL MENISECTOMY (Right)  Anesthesia type: General  Patient location: PACU  Post pain: Pain level controlled  Post assessment: Patient's Cardiovascular Status Stable  Last Vitals:  Filed Vitals:   10/09/13 1142  BP: 128/82  Pulse: 94  Temp: 36.6 C  Resp: 18    Post vital signs: Reviewed and stable  Level of consciousness: alert  Complications: No apparent anesthesia complications

## 2013-10-09 NOTE — H&P (Signed)
  PREOPERATIVE H&P  Chief Complaint: RIGHT KNEE MEDIAL PAIN  HPI: Alicia Bolton is a 57 y.o. female who presents for preoperative history and physical with a diagnosis of RIGHT KNEE MEDIAL MENISCUS TEAR Symptoms are rated as moderate to severe, and have been worsening.  This is significantly impairing activities of daily living.  She has elected for surgical management.   Past Medical History  Diagnosis Date  . Hypertension   . Migraines   . Smoker   . Neuromuscular disorder   . PONV (postoperative nausea and vomiting)    Past Surgical History  Procedure Laterality Date  . Cervical fusion  2007  . Cesarean section  1985  . Abdominal hysterectomy  1997  . Cholecystectomy  1998  . Knee arthroscopy  1999    rt  . Colonoscopy     History   Social History  . Marital Status: Single    Spouse Name: N/A    Number of Children: N/A  . Years of Education: N/A   Social History Main Topics  . Smoking status: Former Smoker    Types: Cigarettes    Quit date: 07/07/2013  . Smokeless tobacco: Never Used  . Alcohol Use: Yes  . Drug Use: No  . Sexual Activity: None   Other Topics Concern  . None   Social History Narrative  . None   History reviewed. No pertinent family history. Allergies  Allergen Reactions  . Floxin [Ofloxacin]     Makes sick to stomach and vomit  . Penicillins Rash   Prior to Admission medications   Medication Sig Start Date End Date Taking? Authorizing Provider  amLODipine (NORVASC) 10 MG tablet TAKE ONE TABLET BY MOUTH EVERY DAY 08/27/13  Yes Donita Brooks, MD  cloNIDine (CATAPRES) 0.1 MG tablet Take 1 tablet (0.1 mg total) by mouth 2 (two) times daily. 08/27/13  Yes Donita Brooks, MD  ibuprofen (ADVIL,MOTRIN) 800 MG tablet Take 1 tablet (800 mg total) by mouth every 8 (eight) hours as needed for pain. 08/03/13  Yes Donita Brooks, MD  SUMAtriptan (IMITREX) 100 MG tablet TAKE ONE TABLET BY MOUTH X1 AS NEEDED 06/25/13  Yes Donita Brooks, MD      Positive ROS: All other systems have been reviewed and were otherwise negative with the exception of those mentioned in the HPI and as above.  Physical Exam: General: Alert, no acute distress Cardiovascular: No pedal edema Respiratory: No cyanosis, no use of accessory musculature GI: No organomegaly, abdomen is soft and non-tender Skin: No lesions in the area of chief complaint Neurologic: Sensation intact distally Psychiatric: Patient is competent for consent with normal mood and affect Lymphatic: No axillary or cervical lymphadenopathy  MUSCULOSKELETAL: Positive medial joint line tenderness, intact Lachman, negative lateral pain. Mild effusion.  Assessment: Right knee medial meniscus tear  Plan: Plan for Procedure(s): RIGHT KNEE ARTHROSCOPY WITH MEDIAL MENISECTOMY  The risks benefits and alternatives were discussed with the patient including but not limited to the risks of nonoperative treatment, versus surgical intervention including infection, bleeding, nerve injury,  blood clots, cardiopulmonary complications, morbidity, mortality, among others, and they were willing to proceed.   Eulas Post, MD Cell [phone]   10/09/2013 9:03 AM

## 2013-10-09 NOTE — Transfer of Care (Signed)
Immediate Anesthesia Transfer of Care Note  Patient: Alicia Bolton  Procedure(s) Performed: Procedure(s) with comments: RIGHT KNEE ARTHROSCOPY WITH MEDIAL MENISECTOMY (Right) - clean  Patient Location: PACU  Anesthesia Type:General  Level of Consciousness: sedated  Airway & Oxygen Therapy: Patient Spontanous Breathing and Patient connected to face mask oxygen  Post-op Assessment: Report given to PACU RN and Post -op Vital signs reviewed and stable  Post vital signs: Reviewed and stable  Complications: No apparent anesthesia complications

## 2013-10-09 NOTE — Anesthesia Preprocedure Evaluation (Signed)
Anesthesia Evaluation  Patient identified by MRN, date of birth, ID band Patient awake    Reviewed: Allergy & Precautions, H&P , NPO status , Patient's Chart, lab work & pertinent test results, reviewed documented beta blocker date and time   History of Anesthesia Complications (+) PONV and history of anesthetic complications  Airway Mallampati: II TM Distance: >3 FB Neck ROM: full    Dental   Pulmonary neg pulmonary ROS, former smoker,  breath sounds clear to auscultation        Cardiovascular hypertension, negative cardio ROS  Rhythm:regular     Neuro/Psych  Headaches, PSYCHIATRIC DISORDERS  Neuromuscular disease    GI/Hepatic negative GI ROS, Neg liver ROS,   Endo/Other  negative endocrine ROS  Renal/GU negative Renal ROS  negative genitourinary   Musculoskeletal   Abdominal   Peds  Hematology negative hematology ROS (+)   Anesthesia Other Findings See surgeon's H&P   Reproductive/Obstetrics negative OB ROS                           Anesthesia Physical Anesthesia Plan  ASA: II  Anesthesia Plan: General   Post-op Pain Management:    Induction: Intravenous  Airway Management Planned: LMA  Additional Equipment:   Intra-op Plan:   Post-operative Plan:   Informed Consent: I have reviewed the patients History and Physical, chart, labs and discussed the procedure including the risks, benefits and alternatives for the proposed anesthesia with the patient or authorized representative who has indicated his/her understanding and acceptance.   Dental Advisory Given  Plan Discussed with: CRNA and Surgeon  Anesthesia Plan Comments:         Anesthesia Quick Evaluation

## 2013-10-12 ENCOUNTER — Encounter (HOSPITAL_BASED_OUTPATIENT_CLINIC_OR_DEPARTMENT_OTHER): Payer: Self-pay | Admitting: Orthopedic Surgery

## 2013-12-03 ENCOUNTER — Other Ambulatory Visit: Payer: Self-pay | Admitting: Family Medicine

## 2013-12-03 ENCOUNTER — Other Ambulatory Visit: Payer: Self-pay

## 2013-12-03 DIAGNOSIS — Z1231 Encounter for screening mammogram for malignant neoplasm of breast: Secondary | ICD-10-CM

## 2013-12-08 ENCOUNTER — Ambulatory Visit (HOSPITAL_COMMUNITY): Payer: 59

## 2013-12-10 ENCOUNTER — Ambulatory Visit (HOSPITAL_COMMUNITY): Payer: 59 | Attending: Family Medicine

## 2014-03-04 ENCOUNTER — Ambulatory Visit: Payer: 59 | Admitting: Physician Assistant

## 2014-03-08 ENCOUNTER — Encounter (HOSPITAL_COMMUNITY): Payer: Self-pay | Admitting: Emergency Medicine

## 2014-03-08 ENCOUNTER — Emergency Department (HOSPITAL_COMMUNITY)
Admission: EM | Admit: 2014-03-08 | Discharge: 2014-03-08 | Disposition: A | Discharge status: Home / Self Care | Payer: 59 | Source: Home / Self Care | Attending: Family Medicine | Admitting: Family Medicine

## 2014-03-08 DIAGNOSIS — L729 Follicular cyst of the skin and subcutaneous tissue, unspecified: Secondary | ICD-10-CM

## 2014-03-08 DIAGNOSIS — L723 Sebaceous cyst: Secondary | ICD-10-CM

## 2014-03-08 MED ORDER — DOXYCYCLINE HYCLATE 100 MG PO CAPS: 100.0000 mg | ORAL_CAPSULE | Freq: Two times a day (BID) | ORAL | Status: DC

## 2014-03-08 NOTE — ED Notes (Signed)
Pt c/o abscess on left buttocks onset 2 days Hx of freq abscess; denies fevers, drainage Sx include pain and tenderness Has been applying warm compressions w/no relief Alert w/no signs of acute distress.

## 2014-03-08 NOTE — Discharge Instructions (Signed)
Continue with sitz baths or warm compresses at home and medication as directed and follow up here if condition does not improve

## 2014-03-08 NOTE — ED Provider Notes (Signed)
CSN: [id number]     Arrival date & time 03/08/14  0807 History   First MD Initiated Contact with Patient 03/08/14 [phone]     Chief Complaint  Patient presents with  . Abscess   (Consider location/radiation/quality/duration/timing/severity/associated sxs/prior Treatment) HPI Comments: Patient states she thinks she developed a small abscess at left medial buttock on 03-06-2014. States she has been using warm epsom salt sitz baths at home and area has reduced in size and in tenderness. States she decided to come for evaluation today to see if area needed for be drained or simply treated with medication. Denies fever/chills.  Patient is a 58 y.o. female presenting with abscess.  Abscess   Past Medical History  Diagnosis Date  . Hypertension   . Migraines   . Smoker   . Neuromuscular disorder   . PONV (postoperative nausea and vomiting)   . Tear of medial meniscus of right knee 10/09/2013   Past Surgical History  Procedure Laterality Date  . Cervical fusion  2007  . Cesarean section  1985  . Abdominal hysterectomy  1997  . Cholecystectomy  1998  . Knee arthroscopy  1999    rt  . Colonoscopy    . Knee arthroscopy with medial menisectomy Right 10/09/2013    Procedure: RIGHT KNEE ARTHROSCOPY WITH MEDIAL MENISECTOMY;  Surgeon: Johnny Bridge, MD;  Location: [facility];  Service: Orthopedics;  Laterality: Right;  clean   No family history on file. History  Substance Use Topics  . Smoking status: Former Smoker    Types: Cigarettes    Quit date: 07/07/2013  . Smokeless tobacco: Never Used  . Alcohol Use: Yes   OB History   Grav Para Term Preterm Abortions TAB SAB Ect Mult Living                 Review of Systems  All other systems reviewed and are negative.   Allergies  Floxin and Penicillins  Home Medications   Prior to Admission medications   Medication Sig Start Date End Date Taking? Authorizing Provider  amLODipine (NORVASC) 10 MG tablet TAKE ONE TABLET  BY MOUTH EVERY DAY 08/27/13  Yes Susy Frizzle, MD  cloNIDine (CATAPRES) 0.1 MG tablet Take 1 tablet (0.1 mg total) by mouth 2 (two) times daily. 08/27/13  Yes Susy Frizzle, MD  SUMAtriptan (IMITREX) 100 MG tablet TAKE ONE TABLET BY MOUTH X1 AS NEEDED 06/25/13  Yes Susy Frizzle, MD  ibuprofen (ADVIL,MOTRIN) 800 MG tablet Take 1 tablet (800 mg total) by mouth every 8 (eight) hours as needed for pain. 08/03/13   Susy Frizzle, MD  methocarbamol (ROBAXIN) 500 MG tablet Take 1 tablet (500 mg total) by mouth 4 (four) times daily. 10/09/13   Johnny Bridge, MD  oxyCODONE-acetaminophen (PERCOCET) 10-325 MG per tablet Take 1-2 tablets by mouth every 6 (six) hours as needed for pain. MAXIMUM TOTAL ACETAMINOPHEN DOSE IS 4000 MG PER DAY 10/09/13   Johnny Bridge, MD  promethazine (PHENERGAN) 25 MG tablet Take 1 tablet (25 mg total) by mouth every 6 (six) hours as needed for nausea or vomiting. 10/09/13   Johnny Bridge, MD   BP 131/84  Pulse 98  Temp(Src) 98.1 F (36.7 C) (Oral)  Resp 18  SpO2 99% Physical Exam  Nursing note and vitals reviewed. Constitutional: She is oriented to person, place, and time. She appears well-developed and well-nourished. No distress.  HENT:  Head: Normocephalic and atraumatic.  Eyes: Conjunctivae are normal. No  scleral icterus.  Cardiovascular: Normal rate.   Pulmonary/Chest: Effort normal.  Musculoskeletal: Normal range of motion.  Neurological: She is alert and oriented to person, place, and time.  Skin: Skin is warm and dry. No rash noted.  Small nodule, about the size of a pencil eraser at left medial buttock without erythema, drainage, tenderness, fluctuance or induration.   Psychiatric: She has a normal mood and affect. Her behavior is normal.    ED Course  Procedures (including critical care time) Labs Review Labs Reviewed - No data to display  Imaging Review No results found.   MDM   1. Skin cyst   Doxy as prescribed, warm compresses  or sitz baths at home with follow up if no improvement.     [place], [place] 03/08/14 [phone]

## 2014-03-11 NOTE — ED Provider Notes (Signed)
Medical screening examination/treatment/procedure(s) were performed by a resident physician or non-physician practitioner and as the supervising physician I was immediately available for consultation/collaboration.  Lynne Leader, MD    Gregor Hams, MD 03/11/14 [phone]

## 2014-06-10 ENCOUNTER — Encounter: Payer: Self-pay | Admitting: Family Medicine

## 2014-06-10 ENCOUNTER — Ambulatory Visit (INDEPENDENT_AMBULATORY_CARE_PROVIDER_SITE_OTHER): Payer: 59 | Admitting: Family Medicine

## 2014-06-10 VITALS — BP 128/88 | HR 96 | Temp 98.1°F | Resp 16 | Ht 66.0 in | Wt 156.0 lb

## 2014-06-10 DIAGNOSIS — L732 Hidradenitis suppurativa: Secondary | ICD-10-CM

## 2014-06-10 DIAGNOSIS — G43909 Migraine, unspecified, not intractable, without status migrainosus: Secondary | ICD-10-CM

## 2014-06-10 DIAGNOSIS — M25569 Pain in unspecified knee: Secondary | ICD-10-CM

## 2014-06-10 DIAGNOSIS — M25561 Pain in right knee: Secondary | ICD-10-CM

## 2014-06-10 MED ORDER — AMLODIPINE BESYLATE 10 MG PO TABS: ORAL_TABLET | ORAL | Status: DC

## 2014-06-10 MED ORDER — TOPIRAMATE 25 MG PO TABS: 50.0000 mg | ORAL_TABLET | Freq: Two times a day (BID) | ORAL | Status: DC

## 2014-06-10 MED ORDER — SUMATRIPTAN SUCCINATE 100 MG PO TABS: ORAL_TABLET | ORAL | Status: DC

## 2014-06-10 MED ORDER — CLONIDINE HCL 0.1 MG PO TABS: 0.1000 mg | ORAL_TABLET | Freq: Two times a day (BID) | ORAL | Status: DC

## 2014-06-10 NOTE — Progress Notes (Signed)
Subjective:    Patient ID: Alicia Bolton, female    DOB: [birth year], 57 y.o.   MRN: [id number]  HPI Patient has a history of severe migraines. In the past she has seen a neurologist. At the present time she is using Imitrex on an as-needed basis.  She has severe migraines one to 2 times a week. It is primarily triggered by light. She develops scotomas, visual is, and then a pulsatile unilateral headache with photophobia and phonophobia. Imaging into a dark room the headaches will improve. This is affecting her work. She also has a history of right knee surgery due to a medial meniscus tear. She continues to have pain in her right knee with prolonged standing and walking. She also has a history of fibromyalgia syndrome. She also has a history of hidradenitis suppurtiva. She gets small cysts and that is in her axilla as well as in her pelvic area several times throughout the year prior to incision and drainage.  Have two documented visits in [facility] system over the last year for these symptoms.  Patient was on full disability. She has since returned to work. She wants documentation of these medical conditions and for me FMLA so she can keep her job but that there are work exceptions provided for her. Past Medical History  Diagnosis Date  . Hypertension   . Migraines   . Smoker   . Neuromuscular disorder   . PONV (postoperative nausea and vomiting)   . Tear of medial meniscus of right knee 10/09/2013   Past Surgical History  Procedure Laterality Date  . Cervical fusion  2007  . Cesarean section  1985  . Abdominal hysterectomy  1997  . Cholecystectomy  1998  . Knee arthroscopy  1999    rt  . Colonoscopy    . Knee arthroscopy with medial menisectomy Right 10/09/2013    Procedure: RIGHT KNEE ARTHROSCOPY WITH MEDIAL MENISECTOMY;  Surgeon: Johnny Bridge, MD;  Location: [facility];  Service: Orthopedics;  Laterality: Right;  clean   Current Outpatient Prescriptions on  File Prior to Visit  Medication Sig Dispense Refill  . ibuprofen (ADVIL,MOTRIN) 800 MG tablet Take 1 tablet (800 mg total) by mouth every 8 (eight) hours as needed for pain.  90 tablet  0   No current facility-administered medications on file prior to visit.   Allergies  Allergen Reactions  . Floxin [Ofloxacin]     Makes sick to stomach and vomit  . Penicillins Rash   History   Social History  . Marital Status: Single    Spouse Name: N/A    Number of Children: N/A  . Years of Education: N/A   Occupational History  . Not on file.   Social History Main Topics  . Smoking status: Former Smoker    Types: Cigarettes    Quit date: 07/07/2013  . Smokeless tobacco: Never Used  . Alcohol Use: Yes  . Drug Use: No  . Sexual Activity: Not on file   Other Topics Concern  . Not on file   Social History Narrative  . No narrative on file      Review of Systems  All other systems reviewed and are negative.      Objective:   Physical Exam  Vitals reviewed. Constitutional: She is oriented to person, place, and time.  Cardiovascular: Normal rate, regular rhythm and normal heart sounds.   Pulmonary/Chest: Effort normal and breath sounds normal.  Abdominal: Soft. Bowel sounds are  normal.  Neurological: She is alert and oriented to person, place, and time. She has normal reflexes. She displays normal reflexes. No cranial nerve deficit. She exhibits normal muscle tone. Coordination normal.          Assessment & Plan:  Migraine, unspecified, without mention of intractable migraine without mention of status migrainosus - Plan: topiramate (TOPAMAX) 25 MG tablet, DISCONTINUED: topiramate (TOPAMAX) 25 MG tablet  Right knee pain  Hidradenitis suppurativa   I will complete FMLA for the patient. At the present time I will state that she has possibly one migraine per week. When she gets a migraine she should be allowed to wear sunglasses to avoid bright lights. Also start the  patient on Topamax and gradually wean her up to 50 mg by mouth twice a day for migraine prophylaxis. I will see the patient back in one month to evaluate for improvement. Regarding her knee pain I am a documentation of this in FMLA but at the present time I do not feel that this requires the patient to be out of work. Regarding her hidradenitis suppurativa, I have put in FMLA that the patient has chest occasionally throughout the year which required surgical drainage and she should be allowed to miss work and this occurs. At the present time I suspect she will have less than 5 occurrences within a 12 month period of time.

## 2014-06-22 ENCOUNTER — Telehealth: Payer: Self-pay | Admitting: Family Medicine

## 2014-06-22 NOTE — Telephone Encounter (Signed)
Pt is needing to speak to Dr Dennard Schaumann about the frequency's of the Cyst condition that she has (its about the FMLA paper work she picked up yesterday)   [phone]

## 2014-06-24 NOTE — Telephone Encounter (Signed)
LMTRC

## 2014-06-24 NOTE — Telephone Encounter (Signed)
Patient is calling back about the fmla papers and has questions about them  Please call her back at [phone]

## 2014-06-28 NOTE — Telephone Encounter (Signed)
Spoke to pt and appt made to discuss with Dr. Dennard Schaumann

## 2014-07-01 ENCOUNTER — Ambulatory Visit (INDEPENDENT_AMBULATORY_CARE_PROVIDER_SITE_OTHER): Payer: 59 | Admitting: Family Medicine

## 2014-07-01 ENCOUNTER — Encounter: Payer: Self-pay | Admitting: Family Medicine

## 2014-07-01 VITALS — BP 122/80 | HR 100 | Temp 98.0°F | Resp 16 | Ht 66.0 in | Wt 152.0 lb

## 2014-07-01 DIAGNOSIS — G43909 Migraine, unspecified, not intractable, without status migrainosus: Secondary | ICD-10-CM

## 2014-07-01 DIAGNOSIS — M25561 Pain in right knee: Secondary | ICD-10-CM

## 2014-07-01 DIAGNOSIS — L732 Hidradenitis suppurativa: Secondary | ICD-10-CM

## 2014-07-01 DIAGNOSIS — M25569 Pain in unspecified knee: Secondary | ICD-10-CM

## 2014-07-01 MED ORDER — IBUPROFEN 800 MG PO TABS: 800.0000 mg | ORAL_TABLET | Freq: Three times a day (TID) | ORAL | Status: DC | PRN

## 2014-07-01 NOTE — Progress Notes (Signed)
Subjective:    Patient ID: Alicia Bolton, female    DOB: [birth year], 58 y.o.   MRN: [id number]  HPI 06/10/14 Patient has a history of severe migraines. In the past she has seen a neurologist. At the present time she is using Imitrex on an as-needed basis.  She has severe migraines one to 2 times a week. It is primarily triggered by light. She develops scotomas, visual is, and then a pulsatile unilateral headache with photophobia and phonophobia. Imaging into a dark room the headaches will improve. This is affecting her work. She also has a history of right knee surgery due to a medial meniscus tear. She continues to have pain in her right knee with prolonged standing and walking. She also has a history of fibromyalgia syndrome. She also has a history of hidradenitis suppurtiva. She gets small cysts and that is in her axilla as well as in her pelvic area several times throughout the year prior to incision and drainage.  Have two documented visits in [facility] system over the last year for these symptoms.  Patient was on full disability. She has since returned to work. She wants documentation of these medical conditions and for me FMLA so she can keep her job but that there are work exceptions provided for her.  At that time, my plan was: I will complete FMLA for the patient. At the present time I will state that she has possibly one migraine per week. When she gets a migraine she should be allowed to wear sunglasses to avoid bright lights. Also start the patient on Topamax and gradually wean her up to 50 mg by mouth twice a day for migraine prophylaxis. I will see the patient back in one month to evaluate for improvement. Regarding her knee pain I am a documentation of this in FMLA but at the present time I do not feel that this requires the patient to be out of work. Regarding her hidradenitis suppurativa, I have put in FMLA that the patient has chest occasionally throughout the year which required  surgical drainage and she should be allowed to miss work and this occurs. At the present time I suspect she will have less than 5 occurrences within a 12 month period of time.  07/01/14 Patient has come in today to discuss her FMLA paperwork. She's concerned I have not given her sufficient time away from her. I explained to the patient she been seen 4 times in the last 2 years for incision and drainage of the cyst and the entire most consistent. She has not been seeing any time for migraine in the most consistent in the last calendar year. On her FMLA paperwork I gave her credit for 3-5 abscesses per year for hidradenitis suppurtiva and 3 absences per year for migraines.  This is more than I can actually document in the system. I believe I have been very fair in the amount of time I allowed the patient away from work for medical reasons. Patient states that she has migraines and cyst more often but she treats them herself at home. Past Medical History  Diagnosis Date  . Hypertension   . Migraines   . Smoker   . Neuromuscular disorder   . PONV (postoperative nausea and vomiting)   . Tear of medial meniscus of right knee 10/09/2013   Past Surgical History  Procedure Laterality Date  . Cervical fusion  2007  . Cesarean section  1985  . Abdominal hysterectomy  [facility]  . Knee arthroscopy  1999    rt  . Colonoscopy    . Knee arthroscopy with medial menisectomy Right 10/09/2013    Procedure: RIGHT KNEE ARTHROSCOPY WITH MEDIAL MENISECTOMY;  Surgeon: Johnny Bridge, MD;  Location: [facility];  Service: Orthopedics;  Laterality: Right;  clean   Current Outpatient Prescriptions on File Prior to Visit  Medication Sig Dispense Refill  . amLODipine (NORVASC) 10 MG tablet TAKE ONE TABLET BY MOUTH EVERY DAY  30 tablet  11  . cloNIDine (CATAPRES) 0.1 MG tablet Take 1 tablet (0.1 mg total) by mouth 2 (two) times daily.  60 tablet  11  . promethazine (PHENERGAN) 25 MG  tablet Take 25 mg by mouth every 6 (six) hours as needed for nausea or vomiting (for migraine).      . SUMAtriptan (IMITREX) 100 MG tablet TAKE ONE TABLET BY MOUTH X1 AS NEEDED  9 tablet  3  . topiramate (TOPAMAX) 25 MG tablet Take 2 tablets (50 mg total) by mouth 2 (two) times daily.  120 tablet  3   No current facility-administered medications on file prior to visit.   Allergies  Allergen Reactions  . Floxin [Ofloxacin]     Makes sick to stomach and vomit  . Penicillins Rash   History   Social History  . Marital Status: Single    Spouse Name: N/A    Number of Children: N/A  . Years of Education: N/A   Occupational History  . Not on file.   Social History Main Topics  . Smoking status: Former Smoker    Types: Cigarettes    Quit date: 07/07/2013  . Smokeless tobacco: Never Used  . Alcohol Use: Yes  . Drug Use: No  . Sexual Activity: Not on file   Other Topics Concern  . Not on file   Social History Narrative  . No narrative on file      Review of Systems  All other systems reviewed and are negative.      Objective:   Physical Exam  Vitals reviewed. Cardiovascular: Normal rate and regular rhythm.   Pulmonary/Chest: Effort normal and breath sounds normal.          Assessment & Plan:  Knee pain, acute, right - Plan: ibuprofen (ADVIL,MOTRIN) 800 MG tablet  Migraine, unspecified, without mention of intractable migraine without mention of status migrainosus  Hidradenitis suppurativa  I explained to the patient that I cannot allow more time away from work than I have evidence and  documentation to support. At the present time she understands this. She is willing to except FMLA paperwork as I have filled out. In the future, we may need to change the number of absences I am allowing based on medical documentation.

## 2014-07-13 ENCOUNTER — Ambulatory Visit: Payer: 59 | Admitting: Family Medicine

## 2014-07-16 ENCOUNTER — Ambulatory Visit: Payer: 59

## 2014-07-21 ENCOUNTER — Encounter: Payer: Self-pay | Admitting: Family Medicine

## 2014-07-21 ENCOUNTER — Ambulatory Visit (INDEPENDENT_AMBULATORY_CARE_PROVIDER_SITE_OTHER): Payer: 59 | Admitting: Family Medicine

## 2014-07-21 VITALS — BP 120/78 | HR 68 | Temp 97.8°F | Resp 14 | Ht 65.0 in | Wt 154.0 lb

## 2014-07-21 DIAGNOSIS — L732 Hidradenitis suppurativa: Secondary | ICD-10-CM | POA: Insufficient documentation

## 2014-07-21 MED ORDER — HYDROCODONE-ACETAMINOPHEN 5-325 MG PO TABS: 1.0000 | ORAL_TABLET | Freq: Four times a day (QID) | ORAL | Status: DC | PRN

## 2014-07-21 MED ORDER — SULFAMETHOXAZOLE-TMP DS 800-160 MG PO TABS: 1.0000 | ORAL_TABLET | Freq: Two times a day (BID) | ORAL | Status: DC

## 2014-07-21 NOTE — Progress Notes (Signed)
Patient ID: Alicia Bolton, female   DOB: 02-06-1956, 58 y.o.   MRN: [id number]   Subjective:    Patient ID: Alicia Bolton, female    DOB: 04-21-1956, 58 y.o.   MRN: [id number]  Patient presents for Cyst  Patient here with 2 cyst on her groin. She has history of hidradenitis does have multiple cysts for many years. She did see Gen. surgery a few years ago and he described a procedure where she would need a skin flap and grafting and she was afraid of this therefore did not proceed. She continues to get boils abscesses almost every month. The 2 lesions came up on Monday. One is draining less side the other one is she is tender and hard. She's not had any fever.   Review Of Systems:  GEN- denies fatigue, fever, weight loss,weakness, recent illness HEENT- denies eye drainage, change in vision, nasal discharge, CVS- denies chest pain, palpitations RESP- denies SOB, cough, wheeze ABD- denies N/V, change in stools, abd pain GU- denies dysuria, hematuria, dribbling, incontinence MSK- denies joint pain, muscle aches, injury Neuro- denies headache, dizziness, syncope, seizure activity       Objective:    BP 120/78  Pulse 68  Temp(Src) 97.8 F (36.6 C) (Oral)  Resp 14  Ht 5\' 5"  (1.651 m)  Wt 154 lb (69.854 kg)  BMI 25.63 kg/m2 GEN- NAD, alert and oriented x3 Skin- multiple pits in skin across mons pubus an groin region bilat, right groin nickle size indurated abscess, minimal flunctance, TTP, no erythema,  Left labia majora small boil- draining small amount of pus, TTP  hyperpigemanted macules on chest wall, no vesicles, no papules, no raised lesions  Procedure- Incision and Drainage Procedure explained to patient questions answered benefits and risks discussed verbal consent obtained. Antiseptic-Betadine Anesthesia-lidocaine Incision performed small amount of pus from left labial abscess, serosangiouness from right groin Minimal blood loss Patient tolerated procedure well Bandage  applied       Assessment & Plan:      Problem List Items Addressed This Visit   Hidradenitis suppurativa - Primary   Relevant Medications      sulfamethoxazole-trimethoprim (BACTRIM/SEPTRA DS) tablet 800-160 mg      Note: This dictation was prepared with Dragon dictation along with smaller phrase technology. Any transcriptional errors that result from this process are unintentional.

## 2014-07-21 NOTE — Assessment & Plan Note (Signed)
Treat with Bactrim BID Refer to general surgery for consult regardings surgery for recurrent lesions Norco given

## 2014-07-21 NOTE — Patient Instructions (Signed)
Take antibiotics as prescribed Use pain meds Sitz baths Referral to general surgery F/u as needed

## 2014-08-10 ENCOUNTER — Other Ambulatory Visit: Payer: Self-pay | Admitting: Neurosurgery

## 2014-09-13 ENCOUNTER — Ambulatory Visit: Payer: 59 | Admitting: Family Medicine

## 2014-09-14 ENCOUNTER — Emergency Department (HOSPITAL_COMMUNITY)
Admission: EM | Admit: 2014-09-14 | Discharge: 2014-09-14 | Disposition: A | Discharge status: Home / Self Care | Payer: 59 | Source: Home / Self Care | Attending: Emergency Medicine | Admitting: Emergency Medicine

## 2014-09-14 ENCOUNTER — Encounter (HOSPITAL_COMMUNITY): Payer: Self-pay | Admitting: *Deleted

## 2014-09-14 DIAGNOSIS — M353 Polymyalgia rheumatica: Secondary | ICD-10-CM

## 2014-09-14 LAB — SEDIMENTATION RATE: SED RATE: 35 mm/h — AB (ref 0–22)

## 2014-09-14 MED ORDER — PREDNISONE 10 MG PO TABS: 10.0000 mg | ORAL_TABLET | Freq: Every day | ORAL | Status: DC

## 2014-09-14 NOTE — ED Provider Notes (Signed)
CSN: [id number]     Arrival date & time 09/14/14  0801 History   None    Chief Complaint  Patient presents with  . Generalized Body Aches   (Consider location/radiation/quality/duration/timing/severity/associated sxs/prior Treatment) HPI Comments: Patient reports 2-3 weeks of diffuse joint pain. Symptom are most noticeable in the mornings and evenings. Joints that are most uncomfortable are her shoulders, feet/ankles, hands and jaw. States that symptoms improve throughout the day and then she becomes more uncomfortable in the evenings. No hx of previous. No known hx of RA, OA or other forms of arthritis. No focal loss of strength or sensation. Using ibuprofen with minimal relief.  PCP: [facility]. Has appointment to see PCP early next week. Works in a warehouse in distribution Smoker  The history is provided by the patient.    Past Medical History  Diagnosis Date  . Hypertension   . Migraines   . Smoker   . Neuromuscular disorder   . PONV (postoperative nausea and vomiting)   . Tear of medial meniscus of right knee 10/09/2013   Past Surgical History  Procedure Laterality Date  . Cervical fusion  2007  . Cesarean section  1985  . Abdominal hysterectomy  1997  . Cholecystectomy  1998  . Knee arthroscopy  1999    rt  . Colonoscopy    . Knee arthroscopy with medial menisectomy Right 10/09/2013    Procedure: RIGHT KNEE ARTHROSCOPY WITH MEDIAL MENISECTOMY;  Surgeon: Johnny Bridge, MD;  Location: [place];  Service: Orthopedics;  Laterality: Right;  clean   History reviewed. No pertinent family history. History  Substance Use Topics  . Smoking status: Former Smoker    Types: Cigarettes    Quit date: 07/07/2013  . Smokeless tobacco: Never Used  . Alcohol Use: Yes   OB History    No data available     Review of Systems  Constitutional: Positive for activity change and fatigue. Negative for fever, chills, diaphoresis, appetite change and unexpected weight  change.  HENT: Negative.   Eyes: Negative.   Respiratory: Negative.   Cardiovascular: Negative.   Gastrointestinal: Negative.   Endocrine: Negative for polydipsia, polyphagia and polyuria.  Genitourinary: Negative.   Musculoskeletal: Positive for arthralgias. Negative for gait problem and neck pain.  Skin: Negative.   Allergic/Immunologic: Negative for immunocompromised state.  Neurological: Negative for dizziness, weakness, light-headedness and numbness.  Hematological: Negative for adenopathy.    Allergies  Floxin and Penicillins  Home Medications   Prior to Admission medications   Medication Sig Start Date End Date Taking? Authorizing Provider  amLODipine (NORVASC) 10 MG tablet TAKE ONE TABLET BY MOUTH EVERY DAY 06/10/14   Susy Frizzle, MD  cloNIDine (CATAPRES) 0.1 MG tablet Take 1 tablet (0.1 mg total) by mouth 2 (two) times daily. 06/10/14   Susy Frizzle, MD  HYDROcodone-acetaminophen (NORCO) 5-325 MG per tablet Take 1 tablet by mouth every 6 (six) hours as needed for moderate pain. 07/21/14   Alycia Rossetti, MD  ibuprofen (ADVIL,MOTRIN) 800 MG tablet Take 1 tablet (800 mg total) by mouth every 8 (eight) hours as needed. 07/01/14   Susy Frizzle, MD  predniSONE (DELTASONE) 10 MG tablet Take 1 tablet (10 mg total) by mouth daily with breakfast. 09/14/14   Lutricia Feil, PA  promethazine (PHENERGAN) 25 MG tablet Take 25 mg by mouth every 6 (six) hours as needed for nausea or vomiting (for migraine). 10/09/13   Johnny Bridge, MD  sulfamethoxazole-trimethoprim (BACTRIM DS)  800-160 MG per tablet Take 1 tablet by mouth 2 (two) times daily. 07/21/14   Alycia Rossetti, MD  SUMAtriptan (IMITREX) 100 MG tablet TAKE ONE TABLET BY MOUTH X1 AS NEEDED 06/10/14   Susy Frizzle, MD  topiramate (TOPAMAX) 25 MG tablet Take 2 tablets (50 mg total) by mouth 2 (two) times daily. 06/10/14   Susy Frizzle, MD   BP 114/78 mmHg  Pulse 84  Temp(Src) 98.6 F (37 C) (Oral)  Resp  16  SpO2 100% Physical Exam  Constitutional: She is oriented to person, place, and time. She appears well-developed and well-nourished. No distress.  HENT:  Head: Normocephalic and atraumatic.  Eyes: Conjunctivae are normal.  Neck: Normal range of motion. Neck supple.  Cardiovascular: Normal rate, regular rhythm and normal heart sounds.   Pulmonary/Chest: Effort normal and breath sounds normal.  Abdominal: Soft. Bowel sounds are normal. She exhibits no distension. There is no tenderness.  Musculoskeletal:  ROM intact at shoulders, knees, hips, elbows, wrists, hands, feet, ankles, neck, back and jaw.  She is most uncomfortable with ROM of shoulders, hands, and hips.  No circulatory or sensory deficits.  Mild swelling of PIP joints of all fingers.   Lymphadenopathy:    She has no cervical adenopathy.  Neurological: She is alert and oriented to person, place, and time.  Skin: Skin is warm and dry. No rash noted. No erythema.  Psychiatric: She has a normal mood and affect. Her behavior is normal.  Nursing note and vitals reviewed.   ED Course  Procedures (including critical care time) Labs Review Labs Reviewed  SEDIMENTATION RATE    Imaging Review No results found.   MDM   1. Polymyalgia rheumatica syndrome    I suspect this patient may be experiencing polymyalgia rheumatica. Will start on low dose glucocorticoid therapy and advise follow up with her PCP. Attempted to draw baseline ESR. Patient refused blood work.      [place], [place] 09/14/14 [phone]

## 2014-09-14 NOTE — ED Notes (Signed)
Pt  Reports  Body  Aches   And  Swelling  Various  Parts  Of  Body  To include  Joints         Symptoms  X  2-3  Weeks     - PT  Sitting  Upright   On exam         Table speaking in  Complete  sentances

## 2014-09-14 NOTE — Discharge Instructions (Signed)
Please take medication as prescribed and follow up with your doctor for additional testing. Please review printed information below.  Polymyalgia Rheumatica Polymyalgia rheumatica (also called PMR or polymyalgia) is a rheumatologic (arthritic) condition that causes pain and morning stiffness in your neck, shoulders, and hips. It is an inflammatory condition. In some people, inflammation of certain structures in the shoulder, hips, or other joints can be seen on special testing. It does not cause joint destruction, as occurs in other arthritic conditions. It usually occurs after 57 years of age, and is more common as you age. It can be confused with several other diseases, but it is usually easily treated. People with PMR often have, or can develop, a more severe rheumatologic condition called giant cell arteritis (also called CGA or temporal arteritis).  CAUSES  The exact cause of PMR is not known.   There are genetic factors involved.  Viruses have been suspected in the cause of PMR. This has not been proven. SYMPTOMS   Aching, pain, and morning stiffness your neck, both shoulders, or both hips.  Symptoms usually start slowly and build gradually.  Morning stiffness usually lasts at least 30 minutes.  Swelling and tenderness in other joints of the arms, hands, legs, and feet may occur.  Swelling and inflammation in the wrists can cause nerve inflammation at the wrist (carpal tunnel syndrome).  You may also have low grade fever, fatigue, weakness, decreased appetite and weight loss. DIAGNOSIS   Your caregiver may suspect that you have PMR based on your description of your symptoms and on your exam.  Your caregiver will examine you to be sure you do not have diseases that can be confused with PMR. These diseases include rheumatoid arthritis, fibromyalgia, or thyroid disease.  Your caregiver should check for signs of giant cell arteritis. This can cause serious complications such as  blindness.  Lab tests can help confirm that you have PMR and not other diseases, but are sometimes inconclusive.  X-rays cannot show PMR. However, it can identify other diseases like rheumatoid arthritis. Your caregiver may have you see a specialist in arthritis and inflammatory diseases (rheumatologist). TREATMENT  The goal of treatment is relief of symptoms. Treatment does not shorten the course of the illness or prevent complications. With proper treatment, you usually feel better almost right away.   The initial treatment of PMR is usually a cortisone (steroid) medication. Your caregiver will help determine a starting dose. The dose is gradually reduced every few weeks to months. Treatment usually lasts one to three years.  Other stronger medications are rarely needed. They will only be prescribed if your symptoms do not get better on cortisone medication alone, or if they recur as the dose is reduced.  Cortisone medication can have different side effects. With the doses of cortisone needed for PMR, the side effects can affect bones and joints, blood sugar control in diabetes, and mood changes. Discuss this with your caregiver.  Your caregiver will evaluate you regularly during your treatment. They will do this in order to assess progress and to check for complications of the illness or treatment.  Physical therapy is sometimes useful. This is especially true if your joints are still stiff after other symptoms have improved. HOME CARE INSTRUCTIONS   Follow your caregiver's instructions. Do not change your dose of cortisone medication on your own.  Keep your appointments for follow-up lab tests and caregiver visits. Your lab tests need to be monitored. You must get checked periodically for giant cell  arteritis.  Follow your caregiver's guidance regarding physical activity (usually no restrictions are needed) or physical therapy.  Your caregiver may have instructions to prevent or check  for side effects from cortisone medication (including bone density testing or treatment). Follow their instructions carefully. SEEK MEDICAL CARE IF:   You develop any side effects from treatment. Side effects can include:  Elevated blood pressure.  High blood sugar (or worsening of diabetes, if you are diabetic).  Difficulty fighting off infections.  Weight gain.  Weakness of the bones (osteoporosis).  Your aches, pains, morning stiffness, or other symptoms get worse with time. This is especially true after your dose of cortisone is reduced.  You develop new joint symptoms (pain, swelling, etc.) SEEK IMMEDIATE MEDICAL CARE IF:   You develop a severe headache.  You start vomiting.  You have problems with your vision.  You have an oral temperature above 102 F (38.9 C), not controlled by medicine. Document Released: 11/22/2004 Document Revised: 10/01/2012 Document Reviewed: 03/07/2009 [facility] Patient Information 2015 [place], [place]. This information is not intended to replace advice given to you by your health care provider. Make sure you discuss any questions you have with your health care provider.  Erythrocyte Sedimentation Rate This is a blood test in which the red blood cells are measured on how long it takes for the cells to settle in a saline or plasma solution over a specified amount of time. In the case of an inflammation or infection in the body, the red blood cells become heavier and will settle more rapidly. This test is used to evaluate and manage rheumatoid arthritis, as well as other inflammatory conditions. PREPARATION FOR TEST Some medications may need to be held in the case that they may affect test results. Your caregiver will let you know if this is necessary.  NORMAL FINDINGS Westergren method Female: up to 15 mm/hr Female:  up to 20 mm/hr Child: up to 10 mm/hr Newborn: 0-2 mm/hr The ESR results depend on which test the lab uses. Ranges for normal findings  may vary among different laboratories and hospitals. You should always check with your doctor after having lab work or other tests done to discuss the meaning of your test results and whether your values are considered within normal limits. MEANING OF TEST  Your caregiver will go over the test results with you and discuss the importance and meaning of your results, as well as treatment options and the need for additional tests if necessary. OBTAINING THE TEST RESULTS  It is your responsibility to obtain your test results. Ask the lab or department performing the test when and how you will get your results. Document Released: 11/07/2004 Document Revised: 01/07/2012 Document Reviewed: 09/24/2008 [facility] Patient Information 2015 [place], [place]. This information is not intended to replace advice given to you by your health care provider. Make sure you discuss any questions you have with your health care provider.

## 2014-09-20 ENCOUNTER — Ambulatory Visit (INDEPENDENT_AMBULATORY_CARE_PROVIDER_SITE_OTHER): Payer: 59 | Admitting: Family Medicine

## 2014-09-20 ENCOUNTER — Encounter: Payer: Self-pay | Admitting: Family Medicine

## 2014-09-20 VITALS — BP 130/70 | HR 68 | Temp 98.4°F | Resp 16 | Ht 65.0 in | Wt 157.0 lb

## 2014-09-20 DIAGNOSIS — M2550 Pain in unspecified joint: Secondary | ICD-10-CM | POA: Insufficient documentation

## 2014-09-20 MED ORDER — DICLOFENAC SODIUM 75 MG PO TBEC: 75.0000 mg | DELAYED_RELEASE_TABLET | Freq: Two times a day (BID) | ORAL | Status: DC

## 2014-09-20 MED ORDER — AMLODIPINE BESYLATE 10 MG PO TABS: ORAL_TABLET | ORAL | Status: DC

## 2014-09-20 MED ORDER — CLONIDINE HCL 0.1 MG PO TABS: 0.1000 mg | ORAL_TABLET | Freq: Two times a day (BID) | ORAL | Status: DC

## 2014-09-20 NOTE — Progress Notes (Signed)
Patient ID: Alicia Bolton, female   DOB: 12/20/1955, 58 y.o.   MRN: [id number]   Subjective:    Patient ID: Alicia Bolton, female    DOB: 02/20/1956, 58 y.o.   MRN: [id number]  Patient presents for Body Aches patient here today with generalized joint pain. She was seen at the urgent care about a week ago due to worsening joint pains. She states that she's had increased stiffness and swelling of bilateral hand she is unable to make a fist most of the day. She also has pain in her shoulder she is unable to lift her arms up above her head pain in her ankles and her knees. She does remember that she had a fall secondary to her right knee giving out about a month ago before all this started. She was seen at urgent care they offered prednisone but she does not want to take steroids. She's not been taking any other anti-inflammatories. They did do an ESR which was slightly elevated. There is  no family history of any significant arthritis    Review Of Systems:  GEN- denies fatigue, fever, weight loss,weakness, recent illness HEENT- denies eye drainage, change in vision, nasal discharge, CVS- denies chest pain, palpitations RESP- denies SOB, cough, wheeze ABD- denies N/V, change in stools, abd pain GU- denies dysuria, hematuria, dribbling, incontinence MSK- denies joint pain, muscle aches, injury Neuro- denies headache, dizziness, syncope, seizure activity       Objective:    BP 130/70 mmHg  Pulse 68  Temp(Src) 98.4 F (36.9 C) (Oral)  Resp 16  Ht 5' 5" (1.651 m)  Wt 157 lb (71.215 kg)  BMI 26.13 kg/m2 GEN- NAD, alert and oriented x3 HEENT- PERRL, EOMI, non injected sclera, pink conjunctiva, MMM, oropharynx clear Neck- Supple, no thyromegaly CVS- RRR, no murmur RESP-CTAB MSK- Decreased ROM shoulder, rotator cuff in tact, normal inspection, swelling of MIP bilat hands, able to make weak fist, good grasp, bilat ankles no swelling FROM, bilat knees- decreased ROM, no effusion EXT- No  edema Pulses- Radial 2+        Assessment & Plan:      Problem List Items Addressed This Visit    Polyarthralgia - Primary   Relevant Orders      CBC with Differential      Comprehensive metabolic panel      Sedimentation Rate      C-reactive protein      ANA      Rheumatoid factor      DG Hand Complete Left      DG Hand Complete Right      Note: This dictation was prepared with Dragon dictation along with smaller phrase technology. Any transcriptional errors that result from this process are unintentional.     

## 2014-09-20 NOTE — Assessment & Plan Note (Signed)
Will check autoimmune labs, start diclofenac BID, xray of hands look for RA changes, declines steroids though I think this will help faster

## 2014-09-20 NOTE — Patient Instructions (Addendum)
Take the anti-inflammatory twice a day  We will call with lab results Get xrays of the hands F/U Pending results

## 2014-09-21 ENCOUNTER — Ambulatory Visit: Payer: 59 | Admitting: Family Medicine

## 2014-09-21 LAB — CBC WITH DIFFERENTIAL/PLATELET
Basophils Absolute: 0 10*3/uL (ref 0.0–0.1)
Basophils Relative: 0 % (ref 0–1)
EOS ABS: 0.2 10*3/uL (ref 0.0–0.7)
Eosinophils Relative: 2 % (ref 0–5)
HCT: 37.9 % (ref 36.0–46.0)
Hemoglobin: 12.9 g/dL (ref 12.0–15.0)
LYMPHS ABS: 3.1 10*3/uL (ref 0.7–4.0)
LYMPHS PCT: 40 % (ref 12–46)
MCH: 30.3 pg (ref 26.0–34.0)
MCHC: 34 g/dL (ref 30.0–36.0)
MCV: 89 fL (ref 78.0–100.0)
MPV: 9.3 fL — AB (ref 9.4–12.4)
Monocytes Absolute: 0.5 10*3/uL (ref 0.1–1.0)
Monocytes Relative: 6 % (ref 3–12)
Neutro Abs: 4 10*3/uL (ref 1.7–7.7)
Neutrophils Relative %: 52 % (ref 43–77)
PLATELETS: 361 10*3/uL (ref 150–400)
RBC: 4.26 MIL/uL (ref 3.87–5.11)
RDW: 15 % (ref 11.5–15.5)
WBC: 7.7 10*3/uL (ref 4.0–10.5)

## 2014-09-21 LAB — COMPREHENSIVE METABOLIC PANEL
ALT: 74 U/L — AB (ref 0–35)
AST: 42 U/L — AB (ref 0–37)
Albumin: 4 g/dL (ref 3.5–5.2)
Alkaline Phosphatase: 101 U/L (ref 39–117)
BUN: 11 mg/dL (ref 6–23)
CHLORIDE: 104 meq/L (ref 96–112)
CO2: 22 mEq/L (ref 19–32)
CREATININE: 0.65 mg/dL (ref 0.50–1.10)
Calcium: 9.2 mg/dL (ref 8.4–10.5)
Glucose, Bld: 110 mg/dL — ABNORMAL HIGH (ref 70–99)
Potassium: 3.7 mEq/L (ref 3.5–5.3)
Sodium: 136 mEq/L (ref 135–145)
Total Bilirubin: 0.4 mg/dL (ref 0.2–1.2)
Total Protein: 7.1 g/dL (ref 6.0–8.3)

## 2014-09-21 LAB — RHEUMATOID FACTOR: RHEUMATOID FACTOR: 11 [IU]/mL (ref <=14)

## 2014-09-21 LAB — C-REACTIVE PROTEIN: CRP: <0.5 mg/dL (ref <0.60)

## 2014-09-21 LAB — ANTI-NUCLEAR AB-TITER (ANA TITER): ANA TITER 1: NEGATIVE (ref <1:40)

## 2014-09-21 LAB — ANA: Anti Nuclear Antibody(ANA): POSITIVE — AB

## 2014-09-21 LAB — SEDIMENTATION RATE: Sed Rate: 25 mm/hr — ABNORMAL HIGH (ref 0–22)

## 2014-09-22 ENCOUNTER — Telehealth: Payer: Self-pay | Admitting: Family Medicine

## 2014-09-22 ENCOUNTER — Other Ambulatory Visit: Payer: Self-pay | Admitting: *Deleted

## 2014-09-22 DIAGNOSIS — R7989 Other specified abnormal findings of blood chemistry: Secondary | ICD-10-CM

## 2014-09-22 DIAGNOSIS — R945 Abnormal results of liver function studies: Principal | ICD-10-CM

## 2014-09-22 NOTE — Telephone Encounter (Signed)
[phone]  Pt states that you just went over lab results with her and she is troubled by some of the results and would like to speak to you about them

## 2014-09-22 NOTE — Telephone Encounter (Signed)
Call placed to patient.   Reports that she is concerned about what could be causing her lifer enzymes to be elevated.   Advised that at this time, there is no clear reason as to why her enzymes were elevated, and that is why further testing is required.   Verbalized understanding.

## 2014-10-04 ENCOUNTER — Ambulatory Visit (INDEPENDENT_AMBULATORY_CARE_PROVIDER_SITE_OTHER): Payer: 59 | Admitting: Family Medicine

## 2014-10-04 ENCOUNTER — Encounter: Payer: Self-pay | Admitting: Family Medicine

## 2014-10-04 VITALS — BP 128/76 | HR 78 | Temp 98.4°F | Resp 16 | Ht 65.0 in | Wt 158.0 lb

## 2014-10-04 DIAGNOSIS — M2550 Pain in unspecified joint: Secondary | ICD-10-CM

## 2014-10-04 DIAGNOSIS — R7989 Other specified abnormal findings of blood chemistry: Secondary | ICD-10-CM | POA: Insufficient documentation

## 2014-10-04 DIAGNOSIS — R945 Abnormal results of liver function studies: Secondary | ICD-10-CM

## 2014-10-04 DIAGNOSIS — Z13220 Encounter for screening for lipoid disorders: Secondary | ICD-10-CM

## 2014-10-04 DIAGNOSIS — G5602 Carpal tunnel syndrome, left upper limb: Secondary | ICD-10-CM

## 2014-10-04 HISTORY — DX: Other specified abnormal findings of blood chemistry: R79.89

## 2014-10-04 LAB — HEPATITIS PANEL, ACUTE
HCV AB: NEGATIVE
HEP A IGM: NONREACTIVE
HEP B C IGM: NONREACTIVE
HEP B S AG: NEGATIVE

## 2014-10-04 LAB — LIPID PANEL
CHOLESTEROL: 193 mg/dL (ref 0–200)
HDL: 42 mg/dL (ref >39)
LDL Cholesterol: 119 mg/dL — ABNORMAL HIGH (ref 0–99)
Total CHOL/HDL Ratio: 4.6 Ratio
Triglycerides: 159 mg/dL — ABNORMAL HIGH (ref <150)
VLDL: 32 mg/dL (ref 0–40)

## 2014-10-04 LAB — HEPATIC FUNCTION PANEL
ALT: 135 U/L — ABNORMAL HIGH (ref 0–35)
AST: 64 U/L — ABNORMAL HIGH (ref 0–37)
Albumin: 4.1 g/dL (ref 3.5–5.2)
Alkaline Phosphatase: 120 U/L — ABNORMAL HIGH (ref 39–117)
BILIRUBIN TOTAL: 0.5 mg/dL (ref 0.2–1.2)
Bilirubin, Direct: 0.1 mg/dL (ref 0.0–0.3)
Indirect Bilirubin: 0.4 mg/dL (ref 0.2–1.2)
Total Protein: 7.3 g/dL (ref 6.0–8.3)

## 2014-10-04 LAB — SEDIMENTATION RATE: Sed Rate: 12 mm/hr (ref 0–22)

## 2014-10-04 NOTE — Addendum Note (Signed)
Addended by: Sheral Flow on: 10/04/2014 09:03 AM   Modules accepted: Orders

## 2014-10-04 NOTE — Patient Instructions (Signed)
Brace prescription written We will call with lab results Continue diclofenac F/U pending results

## 2014-10-04 NOTE — Assessment & Plan Note (Signed)
Continue diclofenac Recheck ESR Xrays of hands, suspect OA due to her job

## 2014-10-04 NOTE — Assessment & Plan Note (Signed)
Repeat LFT, check lipids, check hepatitis panel pt due to have surgery in 3 weeks, may need RUQ ultrasound, avoiding any hepatitic medications

## 2014-10-04 NOTE — Progress Notes (Signed)
Patient ID: Alicia Bolton, female   DOB: July 16, 1956, 58 y.o.   MRN: [id number]   Subjective:    Patient ID: Alicia Bolton, female    DOB: [birth year], 58 y.o.   MRN: [id number]  Patient presents for 2 week F/U and Carpal Tunnel  patient here for interim follow-up on her labs and polyarthralgia. She did not get the x-rays done of her hands which were her main complaint. She is due to have surgery on her neck for fusion and decompression as well as carpal tunnel surgery in her left hand she requests another brace for her left hand until her surgery. We reviewed her labs her sed rate was elevated at the ER I then rechecked it and it come down to 25 which was still minimally elevated she had an ANA which was slightly positive however the titers were negative rheumatoid factor was also negative. Her liver function however did return elevated she declines any alcohol use she does give blood donation but has never received any blood transfusions she does not have any tattoos or history of IV drug abuse. She's not had a cholesterol panel done to rule out fatty liver as the cough either. She is status post gallbladder removal Her arthritis symptoms did improve with diclofenac for the past 2 weeks    Review Of Systems:  GEN- denies fatigue, fever, weight loss,weakness, recent illness HEENT- denies eye drainage, change in vision, nasal discharge, CVS- denies chest pain, palpitations RESP- denies SOB, cough, wheeze ABD- denies N/V, change in stools, abd pain GU- denies dysuria, hematuria, dribbling, incontinence MSK- + joint pain, muscle aches, injury Neuro- denies headache, dizziness, syncope, seizure activity       Objective:    BP 128/76 mmHg  Pulse 78  Temp(Src) 98.4 F (36.9 C) (Oral)  Resp 16  Ht 5\' 5"  (1.651 m)  Wt 158 lb (71.668 kg)  BMI 26.29 kg/m2 GEN- NAD, alert and oriented x3 CVS- RRR, no murmur RESP-CTAB ABD-NABS,soft,NT,ND, no HSM MSK- no swelling of MIP noted today EXT-  No edema Pulses- Radia 2+         Assessment & Plan:      Problem List Items Addressed This Visit    Polyarthralgia   Relevant Orders      Sedimentation Rate   Elevated liver function tests - Primary   Carpal tunnel syndrome    Other Visit Diagnoses    Elevated LFTs        Screening cholesterol level        Relevant Orders       Lipid panel       Note: This dictation was prepared with Dragon dictation along with smaller phrase technology. Any transcriptional errors that result from this process are unintentional.

## 2014-10-04 NOTE — Assessment & Plan Note (Signed)
Script written for left hand brace

## 2014-10-05 ENCOUNTER — Telehealth: Payer: Self-pay | Admitting: Family Medicine

## 2014-10-05 ENCOUNTER — Telehealth: Payer: Self-pay | Admitting: *Deleted

## 2014-10-05 ENCOUNTER — Other Ambulatory Visit: Payer: Self-pay | Admitting: *Deleted

## 2014-10-05 DIAGNOSIS — R740 Nonspecific elevation of levels of transaminase and lactic acid dehydrogenase [LDH]: Principal | ICD-10-CM

## 2014-10-05 DIAGNOSIS — IMO0002 Reserved for concepts with insufficient information to code with codable children: Secondary | ICD-10-CM

## 2014-10-05 NOTE — Telephone Encounter (Signed)
MD please advise

## 2014-10-05 NOTE — Telephone Encounter (Signed)
Patient is calling to ask if she could have mri instead of ultra sound if possible  Would mri be more  Beneficial? [phone]

## 2014-10-05 NOTE — Telephone Encounter (Signed)
[facility] to pt, has appointment for [place] on Friday Dec 11 at 10:40am, [facility], pt is to be NPO after midnight.

## 2014-10-06 ENCOUNTER — Telehealth: Payer: Self-pay | Admitting: Family Medicine

## 2014-10-06 NOTE — Telephone Encounter (Signed)
I spoke with pt and discussed again with her the reasoning for her ultrasound that she did not need an  MRI for her elevated LFTs at this time and that she also needs to go to the x-rays of her hands and continue her anti-inflammatory until I have more information. She voices understanding this time and will follow through with the plan I'm not can make any other changes until we can figure out what is going on and we need imaging as the next step.

## 2014-10-06 NOTE — Telephone Encounter (Signed)
Patient is calling to let dr Buelah Manis know that her arthritis is terrible she can barely bend her fingers and her shoulders hurt really bad. Would like a nurse to call her back at [phone]

## 2014-10-06 NOTE — Telephone Encounter (Signed)
Send to MD per MD request.

## 2014-10-07 NOTE — Telephone Encounter (Signed)
MD made aware on date of call and states that patient requires [place].   Patient aware.

## 2014-10-07 NOTE — Telephone Encounter (Signed)
Please forward to Dr. Keturah Barre.  I did not see patient.

## 2014-10-08 ENCOUNTER — Ambulatory Visit
Admission: RE | Admit: 2014-10-08 | Discharge: 2014-10-08 | Disposition: A | Discharge status: Home / Self Care | Payer: 59 | Source: Ambulatory Visit | Attending: Family Medicine | Admitting: Family Medicine

## 2014-10-08 ENCOUNTER — Other Ambulatory Visit: Payer: Self-pay | Admitting: Family Medicine

## 2014-10-08 ENCOUNTER — Ambulatory Visit: Payer: 59

## 2014-10-08 DIAGNOSIS — M2550 Pain in unspecified joint: Secondary | ICD-10-CM

## 2014-10-08 DIAGNOSIS — R740 Nonspecific elevation of levels of transaminase and lactic acid dehydrogenase [LDH]: Principal | ICD-10-CM

## 2014-10-08 DIAGNOSIS — IMO0002 Reserved for concepts with insufficient information to code with codable children: Secondary | ICD-10-CM

## 2014-10-08 NOTE — Telephone Encounter (Signed)
i already discussed with pt about her hands, LFT

## 2014-10-11 ENCOUNTER — Other Ambulatory Visit: Payer: Self-pay | Admitting: *Deleted

## 2014-10-11 ENCOUNTER — Telehealth: Payer: Self-pay | Admitting: Family Medicine

## 2014-10-11 DIAGNOSIS — R7989 Other specified abnormal findings of blood chemistry: Secondary | ICD-10-CM

## 2014-10-11 DIAGNOSIS — R945 Abnormal results of liver function studies: Principal | ICD-10-CM

## 2014-10-11 MED ORDER — CELECOXIB 100 MG PO CAPS: 100.0000 mg | ORAL_CAPSULE | Freq: Two times a day (BID) | ORAL | Status: DC

## 2014-10-11 NOTE — Telephone Encounter (Signed)
[phone]  Pt is calling about her test results

## 2014-10-11 NOTE — Telephone Encounter (Signed)
PLEASE SEE DOCUMENTATION ON RESULTS.

## 2014-10-12 ENCOUNTER — Ambulatory Visit: Payer: 59 | Admitting: Family Medicine

## 2014-10-14 ENCOUNTER — Other Ambulatory Visit (HOSPITAL_COMMUNITY): Payer: 59

## 2014-10-18 ENCOUNTER — Encounter (HOSPITAL_COMMUNITY): Payer: Self-pay

## 2014-10-18 ENCOUNTER — Encounter (HOSPITAL_COMMUNITY)
Admission: RE | Admit: 2014-10-18 | Discharge: 2014-10-18 | Disposition: A | Discharge status: Home / Self Care | Payer: 59 | Source: Ambulatory Visit | Attending: Neurosurgery | Admitting: Neurosurgery

## 2014-10-18 ENCOUNTER — Other Ambulatory Visit: Payer: Self-pay | Admitting: Neurosurgery

## 2014-10-18 DIAGNOSIS — Z01818 Encounter for other preprocedural examination: Secondary | ICD-10-CM | POA: Diagnosis not present

## 2014-10-18 DIAGNOSIS — I10 Essential (primary) hypertension: Secondary | ICD-10-CM | POA: Diagnosis not present

## 2014-10-18 HISTORY — DX: Unspecified osteoarthritis, unspecified site: M19.90

## 2014-10-18 LAB — BASIC METABOLIC PANEL
ANION GAP: 13 (ref 5–15)
BUN: 14 mg/dL (ref 6–23)
CO2: 23 mEq/L (ref 19–32)
Calcium: 8.8 mg/dL (ref 8.4–10.5)
Chloride: 107 mEq/L (ref 96–112)
Creatinine, Ser: 0.71 mg/dL (ref 0.50–1.10)
GFR calc Af Amer: >90 mL/min (ref >90)
GLUCOSE: 111 mg/dL — AB (ref 70–99)
Potassium: 3.4 mEq/L — ABNORMAL LOW (ref 3.7–5.3)
Sodium: 143 mEq/L (ref 137–147)

## 2014-10-18 LAB — CBC
HCT: 35 % — ABNORMAL LOW (ref 36.0–46.0)
HEMOGLOBIN: 11.8 g/dL — AB (ref 12.0–15.0)
MCH: 29.7 pg (ref 26.0–34.0)
MCHC: 33.7 g/dL (ref 30.0–36.0)
MCV: 88.2 fL (ref 78.0–100.0)
Platelets: 289 10*3/uL (ref 150–400)
RBC: 3.97 MIL/uL (ref 3.87–5.11)
RDW: 14.8 % (ref 11.5–15.5)
WBC: 6.3 10*3/uL (ref 4.0–10.5)

## 2014-10-18 LAB — SURGICAL PCR SCREEN
MRSA, PCR: NEGATIVE
Staphylococcus aureus: NEGATIVE

## 2014-10-18 NOTE — Pre-Procedure Instructions (Signed)
Alicia Bolton  10/18/2014   Your procedure is scheduled on:  Monday October 25, 2014 at 10:47 AM.  Report to [facility] Admitting at 8:45 AM.  Call this number if you have problems the morning of surgery: [phone]   For any other questions Monday-Friday from 8am-4pm call: [phone]   Remember:   Do not eat food or drink liquids after midnight.   Take these medicines the morning of surgery with A SIP OF WATER: Amlodipine (Norvasc), Clonidine (Catapres), Hydrocodone if needed, Methocarbamol (Robaxin) if needed, and Sumatriptan (Imitrex) if needed   Please stop taking Celebrex, Mortin, Advil, Ibuprofen, etc    Do not wear jewelry, make-up or nail polish.  Do not wear lotions, powders, or perfumes.   Do not shave 48 hours prior to surgery.   Do not bring valuables to the hospital.  [facility] is not responsible for any belongings or valuables.               Contacts, dentures or bridgework may not be worn into surgery.  Leave suitcase in the car. After surgery it may be brought to your room.  For patients admitted to the hospital, discharge time is determined by your treatment team.               Patients discharged the day of surgery will not be allowed to drive home.  Name and phone number of your driver:   Special Instructions: Shower using CHG soap the night before and the morning of your surgery   Please read over the following fact sheets that you were given: Pain Booklet, Coughing and Deep Breathing, MRSA Information and Surgical Site Infection Prevention

## 2014-10-18 NOTE — Progress Notes (Signed)
PCP is [facility]. Patient denied having a stress test, cardiac cath, or sleep study. While reviewing consent patient informed Nurse that she was having neck surgery and carpal tunnel surgery on her left wrist. Carpal tunnel was not no consent form. Nurse then called Lorriane Shire at Dr. Windy Carina office and informed her of this. She stated that it needed to be added to consent. Patient updated and Nurse told patient that consent form would be signed DOS as it needed to be updated. Patient verbalized understanding.

## 2014-10-21 NOTE — Progress Notes (Signed)
Patient left DOS instructions in EKG room in PAT. Nurse called patient and went over DOS instructions. Patient verbalized understanding. Patient thanked Nurse for calling as she had no idea where she had misplaced papers.

## 2014-10-24 MED ORDER — DEXAMETHASONE SODIUM PHOSPHATE 10 MG/ML IJ SOLN
10.0000 mg | INTRAMUSCULAR | Status: AC
  Administered 2014-10-25: 10 mg via INTRAVENOUS
  Filled 2014-10-24: qty 1

## 2014-10-24 MED ORDER — VANCOMYCIN HCL IN DEXTROSE 1-5 GM/200ML-% IV SOLN
1000.0000 mg | INTRAVENOUS | Status: AC
  Administered 2014-10-25: 1000 mg via INTRAVENOUS
  Filled 2014-10-24: qty 200

## 2014-10-25 ENCOUNTER — Inpatient Hospital Stay (HOSPITAL_COMMUNITY): Payer: 59 | Admitting: Certified Registered"

## 2014-10-25 ENCOUNTER — Inpatient Hospital Stay (HOSPITAL_COMMUNITY): Payer: 59

## 2014-10-25 ENCOUNTER — Other Ambulatory Visit: Payer: 59

## 2014-10-25 ENCOUNTER — Encounter (HOSPITAL_COMMUNITY): Payer: Self-pay | Admitting: *Deleted

## 2014-10-25 ENCOUNTER — Encounter (HOSPITAL_COMMUNITY)
Admission: RE | Disposition: A | Discharge status: Home / Self Care | Payer: Self-pay | Source: Ambulatory Visit | Attending: Neurosurgery

## 2014-10-25 ENCOUNTER — Ambulatory Visit (HOSPITAL_COMMUNITY)
Admission: RE | Admit: 2014-10-25 | Discharge: 2014-10-27 | Disposition: A | Discharge status: Home / Self Care | Payer: 59 | Source: Ambulatory Visit | Attending: Neurosurgery | Admitting: Neurosurgery

## 2014-10-25 DIAGNOSIS — G43909 Migraine, unspecified, not intractable, without status migrainosus: Secondary | ICD-10-CM | POA: Diagnosis not present

## 2014-10-25 DIAGNOSIS — I10 Essential (primary) hypertension: Secondary | ICD-10-CM | POA: Diagnosis not present

## 2014-10-25 DIAGNOSIS — M4802 Spinal stenosis, cervical region: Secondary | ICD-10-CM | POA: Diagnosis present

## 2014-10-25 DIAGNOSIS — Z79899 Other long term (current) drug therapy: Secondary | ICD-10-CM | POA: Insufficient documentation

## 2014-10-25 DIAGNOSIS — F17210 Nicotine dependence, cigarettes, uncomplicated: Secondary | ICD-10-CM | POA: Diagnosis not present

## 2014-10-25 DIAGNOSIS — M47812 Spondylosis without myelopathy or radiculopathy, cervical region: Secondary | ICD-10-CM | POA: Diagnosis present

## 2014-10-25 DIAGNOSIS — M40202 Unspecified kyphosis, cervical region: Secondary | ICD-10-CM | POA: Diagnosis not present

## 2014-10-25 DIAGNOSIS — M1990 Unspecified osteoarthritis, unspecified site: Secondary | ICD-10-CM | POA: Insufficient documentation

## 2014-10-25 DIAGNOSIS — G5602 Carpal tunnel syndrome, left upper limb: Secondary | ICD-10-CM | POA: Insufficient documentation

## 2014-10-25 DIAGNOSIS — Z79891 Long term (current) use of opiate analgesic: Secondary | ICD-10-CM | POA: Diagnosis not present

## 2014-10-25 HISTORY — PX: ANTERIOR CERVICAL DECOMP/DISCECTOMY FUSION: SHX1161

## 2014-10-25 HISTORY — DX: Spinal stenosis, cervical region: M48.02

## 2014-10-25 HISTORY — PX: CARPAL TUNNEL RELEASE: SHX101

## 2014-10-25 SURGERY — ANTERIOR CERVICAL DECOMPRESSION/DISCECTOMY FUSION 2 LEVEL/HARDWARE REMOVAL
Anesthesia: General

## 2014-10-25 MED ORDER — HYDROCODONE-ACETAMINOPHEN 5-325 MG PO TABS: 1.0000 | ORAL_TABLET | Freq: Four times a day (QID) | ORAL | Status: DC | PRN

## 2014-10-25 MED ORDER — HYDROMORPHONE HCL 1 MG/ML IJ SOLN
INTRAMUSCULAR | Status: AC
  Filled 2014-10-25: qty 1

## 2014-10-25 MED ORDER — AMLODIPINE BESYLATE 10 MG PO TABS
10.0000 mg | ORAL_TABLET | Freq: Every day | ORAL | Status: DC
  Administered 2014-10-26 – 2014-10-27 (×2): 10 mg via ORAL
  Filled 2014-10-25 (×2): qty 1

## 2014-10-25 MED ORDER — THROMBIN 5000 UNITS EX SOLR
OROMUCOSAL | Status: DC | PRN
  Administered 2014-10-25: 13:00:00 via TOPICAL

## 2014-10-25 MED ORDER — MENTHOL 3 MG MT LOZG: 1.0000 | LOZENGE | OROMUCOSAL | Status: DC | PRN

## 2014-10-25 MED ORDER — SODIUM CHLORIDE 0.9 % IJ SOLN: 3.0000 mL | INTRAMUSCULAR | Status: DC | PRN

## 2014-10-25 MED ORDER — ROCURONIUM BROMIDE 100 MG/10ML IV SOLN
INTRAVENOUS | Status: DC | PRN
  Administered 2014-10-25: 40 mg via INTRAVENOUS

## 2014-10-25 MED ORDER — MIDAZOLAM HCL 5 MG/5ML IJ SOLN
INTRAMUSCULAR | Status: DC | PRN
  Administered 2014-10-25: 2 mg via INTRAVENOUS

## 2014-10-25 MED ORDER — 0.9 % SODIUM CHLORIDE (POUR BTL) OPTIME
TOPICAL | Status: DC | PRN
  Administered 2014-10-25 (×2): 1000 mL

## 2014-10-25 MED ORDER — PROPOFOL 10 MG/ML IV BOLUS
INTRAVENOUS | Status: AC
  Filled 2014-10-25: qty 20

## 2014-10-25 MED ORDER — ACETAMINOPHEN 325 MG PO TABS: 650.0000 mg | ORAL_TABLET | ORAL | Status: DC | PRN

## 2014-10-25 MED ORDER — ONDANSETRON HCL 4 MG/2ML IJ SOLN
INTRAMUSCULAR | Status: DC | PRN
  Administered 2014-10-25: 4 mg via INTRAVENOUS

## 2014-10-25 MED ORDER — VANCOMYCIN HCL IN DEXTROSE 1-5 GM/200ML-% IV SOLN
1000.0000 mg | Freq: Once | INTRAVENOUS | Status: AC
  Administered 2014-10-25: 1000 mg via INTRAVENOUS
  Filled 2014-10-25: qty 200

## 2014-10-25 MED ORDER — ONDANSETRON HCL 4 MG/2ML IJ SOLN
INTRAMUSCULAR | Status: AC
  Filled 2014-10-25: qty 2

## 2014-10-25 MED ORDER — SUMATRIPTAN SUCCINATE 100 MG PO TABS
100.0000 mg | ORAL_TABLET | ORAL | Status: DC | PRN
  Filled 2014-10-25: qty 1

## 2014-10-25 MED ORDER — LIDOCAINE HCL (CARDIAC) 20 MG/ML IV SOLN
INTRAVENOUS | Status: DC | PRN
  Administered 2014-10-25: 80 mg via INTRAVENOUS

## 2014-10-25 MED ORDER — OXYCODONE-ACETAMINOPHEN 5-325 MG PO TABS
1.0000 | ORAL_TABLET | ORAL | Status: DC | PRN
  Administered 2014-10-25 – 2014-10-27 (×8): 2 via ORAL
  Filled 2014-10-25 (×8): qty 2

## 2014-10-25 MED ORDER — LIDOCAINE-EPINEPHRINE 1 %-1:100000 IJ SOLN
INTRAMUSCULAR | Status: DC | PRN
  Administered 2014-10-25: 4 mL

## 2014-10-25 MED ORDER — DIPHENHYDRAMINE HCL 50 MG/ML IJ SOLN
INTRAMUSCULAR | Status: AC
  Filled 2014-10-25: qty 1

## 2014-10-25 MED ORDER — SODIUM CHLORIDE 0.9 % IV SOLN: 250.0000 mL | INTRAVENOUS | Status: DC

## 2014-10-25 MED ORDER — CEFAZOLIN SODIUM 1-5 GM-% IV SOLN: 1.0000 g | Freq: Three times a day (TID) | INTRAVENOUS | Status: DC

## 2014-10-25 MED ORDER — SUFENTANIL CITRATE 50 MCG/ML IV SOLN
INTRAVENOUS | Status: AC
  Filled 2014-10-25: qty 1

## 2014-10-25 MED ORDER — SODIUM CHLORIDE 0.9 % IJ SOLN: 3.0000 mL | Freq: Two times a day (BID) | INTRAMUSCULAR | Status: DC

## 2014-10-25 MED ORDER — DIPHENHYDRAMINE HCL 50 MG/ML IJ SOLN
INTRAMUSCULAR | Status: DC | PRN
  Administered 2014-10-25: 10 mg via INTRAVENOUS

## 2014-10-25 MED ORDER — PROPOFOL 10 MG/ML IV BOLUS
INTRAVENOUS | Status: DC | PRN
  Administered 2014-10-25: 200 mg via INTRAVENOUS

## 2014-10-25 MED ORDER — ONDANSETRON HCL 4 MG/2ML IJ SOLN: 4.0000 mg | Freq: Once | INTRAMUSCULAR | Status: DC | PRN

## 2014-10-25 MED ORDER — CYCLOBENZAPRINE HCL 10 MG PO TABS
10.0000 mg | ORAL_TABLET | Freq: Three times a day (TID) | ORAL | Status: DC | PRN
  Administered 2014-10-25 – 2014-10-26 (×2): 10 mg via ORAL
  Filled 2014-10-25 (×2): qty 1

## 2014-10-25 MED ORDER — LACTATED RINGERS IV SOLN
INTRAVENOUS | Status: DC
  Administered 2014-10-25 (×2): via INTRAVENOUS

## 2014-10-25 MED ORDER — HEMOSTATIC AGENTS (NO CHARGE) OPTIME
TOPICAL | Status: DC | PRN
  Administered 2014-10-25: 1 via TOPICAL

## 2014-10-25 MED ORDER — METHOCARBAMOL 500 MG PO TABS
750.0000 mg | ORAL_TABLET | Freq: Three times a day (TID) | ORAL | Status: DC | PRN
  Administered 2014-10-25 – 2014-10-26 (×2): 750 mg via ORAL
  Filled 2014-10-25 (×2): qty 2

## 2014-10-25 MED ORDER — THROMBIN 5000 UNITS EX SOLR
CUTANEOUS | Status: DC | PRN
  Administered 2014-10-25 (×2): 5000 [IU] via TOPICAL

## 2014-10-25 MED ORDER — CLONIDINE HCL 0.1 MG PO TABS
0.1000 mg | ORAL_TABLET | Freq: Two times a day (BID) | ORAL | Status: DC
  Administered 2014-10-25 – 2014-10-27 (×4): 0.1 mg via ORAL
  Filled 2014-10-25 (×5): qty 1

## 2014-10-25 MED ORDER — DOCUSATE SODIUM 100 MG PO CAPS
100.0000 mg | ORAL_CAPSULE | Freq: Two times a day (BID) | ORAL | Status: DC
  Administered 2014-10-26 (×2): 100 mg via ORAL
  Filled 2014-10-25 (×3): qty 1

## 2014-10-25 MED ORDER — HYDROMORPHONE HCL 1 MG/ML IJ SOLN
0.5000 mg | INTRAMUSCULAR | Status: DC | PRN
  Administered 2014-10-25 (×2): 1 mg via INTRAVENOUS
  Filled 2014-10-25 (×2): qty 1

## 2014-10-25 MED ORDER — ACETAMINOPHEN 650 MG RE SUPP: 650.0000 mg | RECTAL | Status: DC | PRN

## 2014-10-25 MED ORDER — SODIUM CHLORIDE 0.9 % IR SOLN
Status: DC | PRN
  Administered 2014-10-25: 13:00:00

## 2014-10-25 MED ORDER — PHENOL 1.4 % MT LIQD
1.0000 | OROMUCOSAL | Status: DC | PRN
  Filled 2014-10-25: qty 177

## 2014-10-25 MED ORDER — CELECOXIB 100 MG PO CAPS
100.0000 mg | ORAL_CAPSULE | Freq: Two times a day (BID) | ORAL | Status: DC
  Administered 2014-10-25 – 2014-10-27 (×4): 100 mg via ORAL
  Filled 2014-10-25 (×5): qty 1

## 2014-10-25 MED ORDER — ROCURONIUM BROMIDE 50 MG/5ML IV SOLN
INTRAVENOUS | Status: AC
  Filled 2014-10-25: qty 1

## 2014-10-25 MED ORDER — LIDOCAINE HCL (CARDIAC) 20 MG/ML IV SOLN
INTRAVENOUS | Status: AC
  Filled 2014-10-25: qty 10

## 2014-10-25 MED ORDER — HYDROMORPHONE HCL 1 MG/ML IJ SOLN
0.2500 mg | INTRAMUSCULAR | Status: DC | PRN
  Administered 2014-10-25 (×4): 0.5 mg via INTRAVENOUS

## 2014-10-25 MED ORDER — SUFENTANIL CITRATE 50 MCG/ML IV SOLN
INTRAVENOUS | Status: DC | PRN
  Administered 2014-10-25 (×2): 10 ug via INTRAVENOUS
  Administered 2014-10-25: 20 ug via INTRAVENOUS
  Administered 2014-10-25: 10 ug via INTRAVENOUS

## 2014-10-25 MED ORDER — ONDANSETRON HCL 4 MG/2ML IJ SOLN
4.0000 mg | INTRAMUSCULAR | Status: DC | PRN
  Administered 2014-10-25: 4 mg via INTRAVENOUS
  Filled 2014-10-25: qty 2

## 2014-10-25 SURGICAL SUPPLY — 97 items
APL SKNCLS STERI-STRIP NONHPOA (GAUZE/BANDAGES/DRESSINGS) ×2
BAG DECANTER FOR FLEXI CONT (MISCELLANEOUS) ×4 IMPLANT
BANDAGE ELASTIC 3 VELCRO ST LF (GAUZE/BANDAGES/DRESSINGS) ×4 IMPLANT
BENZOIN TINCTURE PRP APPL 2/3 (GAUZE/BANDAGES/DRESSINGS) ×4 IMPLANT
BIT DRILL SPINE QC 12 (BIT) ×2 IMPLANT
BLADE SURG 15 STRL LF DISP TIS (BLADE) ×2 IMPLANT
BLADE SURG 15 STRL SS (BLADE) ×4
BNDG ADH 5X3 H2O RPLNT NS (GAUZE/BANDAGES/DRESSINGS) ×2
BNDG COHESIVE 3X5 WHT NS (GAUZE/BANDAGES/DRESSINGS) ×2 IMPLANT
BNDG GAUZE ELAST 4 BULKY (GAUZE/BANDAGES/DRESSINGS) ×6 IMPLANT
BRUSH SCRUB EZ PLAIN DRY (MISCELLANEOUS) ×8 IMPLANT
BUR MATCHSTICK NEURO 3.0 LAGG (BURR) ×4 IMPLANT
CANISTER SUCT 3000ML (MISCELLANEOUS) ×8 IMPLANT
CLOSURE WOUND 1/2 X4 (GAUZE/BANDAGES/DRESSINGS) ×1
CONT SPEC 4OZ CLIKSEAL STRL BL (MISCELLANEOUS) ×4 IMPLANT
CORDS BIPOLAR (ELECTRODE) ×4 IMPLANT
DECANTER SPIKE VIAL GLASS SM (MISCELLANEOUS) ×8 IMPLANT
DRAPE C-ARM 42X72 X-RAY (DRAPES) ×8 IMPLANT
DRAPE EXTREMITY T 121X128X90 (DRAPE) ×4 IMPLANT
DRAPE LAPAROTOMY 100X72 PEDS (DRAPES) ×4 IMPLANT
DRAPE MICROSCOPE LEICA (MISCELLANEOUS) ×4 IMPLANT
DRAPE POUCH INSTRU U-SHP 10X18 (DRAPES) ×4 IMPLANT
DRSG EMULSION OIL 3X3 NADH (GAUZE/BANDAGES/DRESSINGS) ×4 IMPLANT
DRSG OPSITE POSTOP 4X6 (GAUZE/BANDAGES/DRESSINGS) ×4 IMPLANT
DURAPREP 6ML APPLICATOR 50/CS (WOUND CARE) ×4 IMPLANT
ELECT COATED BLADE 2.86 ST (ELECTRODE) ×4 IMPLANT
ELECT REM PT RETURN 9FT ADLT (ELECTROSURGICAL) ×4
ELECTRODE REM PT RTRN 9FT ADLT (ELECTROSURGICAL) ×2 IMPLANT
GAUZE SPONGE 4X4 12PLY STRL (GAUZE/BANDAGES/DRESSINGS) ×6 IMPLANT
GAUZE SPONGE 4X4 16PLY XRAY LF (GAUZE/BANDAGES/DRESSINGS) ×4 IMPLANT
GLOVE BIO SURGEON STRL SZ 6.5 (GLOVE) IMPLANT
GLOVE BIO SURGEON STRL SZ7 (GLOVE) IMPLANT
GLOVE BIO SURGEON STRL SZ7.5 (GLOVE) IMPLANT
GLOVE BIO SURGEON STRL SZ8 (GLOVE) ×8 IMPLANT
GLOVE BIO SURGEON STRL SZ8.5 (GLOVE) IMPLANT
GLOVE BIO SURGEONS STRL SZ 6.5 (GLOVE)
GLOVE BIOGEL M 8.0 STRL (GLOVE) IMPLANT
GLOVE ECLIPSE 6.5 STRL STRAW (GLOVE) IMPLANT
GLOVE ECLIPSE 7.0 STRL STRAW (GLOVE) IMPLANT
GLOVE ECLIPSE 7.5 STRL STRAW (GLOVE) IMPLANT
GLOVE ECLIPSE 8.0 STRL XLNG CF (GLOVE) IMPLANT
GLOVE ECLIPSE 8.5 STRL (GLOVE) IMPLANT
GLOVE EXAM NITRILE LRG STRL (GLOVE) IMPLANT
GLOVE EXAM NITRILE MD LF STRL (GLOVE) IMPLANT
GLOVE EXAM NITRILE XL STR (GLOVE) IMPLANT
GLOVE EXAM NITRILE XS STR PU (GLOVE) IMPLANT
GLOVE INDICATOR 6.5 STRL GRN (GLOVE) IMPLANT
GLOVE INDICATOR 7.0 STRL GRN (GLOVE) IMPLANT
GLOVE INDICATOR 7.5 STRL GRN (GLOVE) IMPLANT
GLOVE INDICATOR 8.0 STRL GRN (GLOVE) IMPLANT
GLOVE INDICATOR 8.5 STRL (GLOVE) ×8 IMPLANT
GLOVE OPTIFIT SS 8.0 STRL (GLOVE) IMPLANT
GLOVE SURG SS PI 6.5 STRL IVOR (GLOVE) IMPLANT
GOWN STRL REUS W/ TWL LRG LVL3 (GOWN DISPOSABLE) ×4 IMPLANT
GOWN STRL REUS W/ TWL XL LVL3 (GOWN DISPOSABLE) ×4 IMPLANT
GOWN STRL REUS W/TWL 2XL LVL3 (GOWN DISPOSABLE) ×8 IMPLANT
GOWN STRL REUS W/TWL LRG LVL3 (GOWN DISPOSABLE) ×8
GOWN STRL REUS W/TWL XL LVL3 (GOWN DISPOSABLE) ×8
HALTER HD/CHIN CERV TRACTION D (MISCELLANEOUS) ×4 IMPLANT
HAND ALUMI LG (SOFTGOODS) ×4 IMPLANT
HEMOSTAT POWDER KIT SURGIFOAM (HEMOSTASIS) ×2 IMPLANT
KIT BASIN OR (CUSTOM PROCEDURE TRAY) ×8 IMPLANT
KIT ROOM TURNOVER OR (KITS) ×8 IMPLANT
LIQUID BAND (GAUZE/BANDAGES/DRESSINGS) IMPLANT
NDL HYPO 18GX1.5 BLUNT FILL (NEEDLE) ×2 IMPLANT
NDL HYPO 25X1 1.5 SAFETY (NEEDLE) IMPLANT
NDL SPNL 20GX3.5 QUINCKE YW (NEEDLE) ×2 IMPLANT
NEEDLE HYPO 18GX1.5 BLUNT FILL (NEEDLE) ×4 IMPLANT
NEEDLE HYPO 25X1 1.5 SAFETY (NEEDLE) IMPLANT
NEEDLE SPNL 20GX3.5 QUINCKE YW (NEEDLE) ×4 IMPLANT
NS IRRIG 1000ML POUR BTL (IV SOLUTION) ×8 IMPLANT
PACK LAMINECTOMY NEURO (CUSTOM PROCEDURE TRAY) ×4 IMPLANT
PACK SURGICAL SETUP 50X90 (CUSTOM PROCEDURE TRAY) ×4 IMPLANT
PAD ARMBOARD 7.5X6 YLW CONV (MISCELLANEOUS) ×20 IMPLANT
PLATE ANT CERV XTEND 2 LV 32 (Plate) ×2 IMPLANT
RUBBERBAND STERILE (MISCELLANEOUS) ×8 IMPLANT
SCREW SELF TAP VARIABLE 4.6X12 (Screw) ×4 IMPLANT
SCREW XTD VAR 4.2 SELF TAP 12 (Screw) ×8 IMPLANT
SPACER CERVICAL FRGE 12X14X6-7 (Spacer) ×2 IMPLANT
SPACER CERVICAL FRGE 12X14X7-7 (Spacer) IMPLANT
SPACER CERVICAL FRGE 12X14X8-7 (Spacer) ×2 IMPLANT
SPONGE INTESTINAL PEANUT (DISPOSABLE) ×4 IMPLANT
SPONGE SURGIFOAM ABS GEL SZ50 (HEMOSTASIS) IMPLANT
STOCKINETTE 4X48 STRL (DRAPES) ×4 IMPLANT
STRIP CLOSURE SKIN 1/2X4 (GAUZE/BANDAGES/DRESSINGS) ×3 IMPLANT
SUT ETHILON 3 0 PS 1 (SUTURE) ×4 IMPLANT
SUT VIC AB 3-0 SH 8-18 (SUTURE) ×4 IMPLANT
SUT VICRYL 4-0 PS2 18IN ABS (SUTURE) ×4 IMPLANT
SYR 20ML ECCENTRIC (SYRINGE) ×4 IMPLANT
SYR BULB 3OZ (MISCELLANEOUS) ×4 IMPLANT
SYR CONTROL 10ML LL (SYRINGE) IMPLANT
TAPE CLOTH 4X10 WHT NS (GAUZE/BANDAGES/DRESSINGS) IMPLANT
TOWEL OR 17X24 6PK STRL BLUE (TOWEL DISPOSABLE) ×8 IMPLANT
TOWEL OR 17X26 10 PK STRL BLUE (TOWEL DISPOSABLE) ×8 IMPLANT
TRAP SPECIMEN MUCOUS 40CC (MISCELLANEOUS) ×4 IMPLANT
UNDERPAD 30X30 INCONTINENT (UNDERPADS AND DIAPERS) ×4 IMPLANT
WATER STERILE IRR 1000ML POUR (IV SOLUTION) ×8 IMPLANT

## 2014-10-25 NOTE — Transfer of Care (Signed)
Immediate Anesthesia Transfer of Care Note  Patient: Alicia Bolton  Procedure(s) Performed: Procedure(s): CERVICAL THREE-FOUR, CERVICAL FOUR FIVE ANTERIOR CERVICAL DECOMPRESSION/DISCECTOMY FUSION 2 LEVEL/HARDWARE REMOVALCERVICAL FIVE-SEVEN (N/A) CARPAL TUNNEL RELEASE (Left)  Patient Location: PACU  Anesthesia Type:General  Level of Consciousness: awake, alert  and oriented  Airway & Oxygen Therapy: Patient Spontanous Breathing and Patient connected to nasal cannula oxygen  Post-op Assessment: Report given to PACU RN, Post -op Vital signs reviewed and stable and Patient moving all extremities  Post vital signs: Reviewed and stable  Complications: No apparent anesthesia complications

## 2014-10-25 NOTE — Op Note (Signed)
Preoperative diagnosis: Cervical spondylosis with stenosis at C3-4 and C4-5 as well as left carpal tunnel syndrome  Postoperative diagnosis: Same  Procedure: Anterior cervical discectomies and fusion at C3-4 and C4-5 using allograft wedges and the globus extend plate as well as through a separate incision a left carpal tunnel release  Surgeon: Kary Kos  Assistant for the anterior cervical discectomy and fusion: Rinaldo Cloud for the carpal tunnel release: None  Anesthesia: Gen.  EBL: Minimal. 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 Due to her failure conservative treatment imaging findings and progressive clinical syndrome I recommended anterior cervical discectomies and fusion at C4-5 and C3-4. As well as left carpal tunnel release. I extensively went over the risks and benefits of the operation with the patient as well as perioperative course expectations of outcome and alternatives of surgery and she understood and agreed to proceed forward.  Operative procedure: Patient brought into the OR was induced under general is positioned supine the neck in slight extension 5 pounds all traction the right 70 is prepped and draped in routine sterile fashion preoperative x-ray localized into incision superior best approximated the C3-4 and C4-5 disc spaces. So after adequate prepping and draping a curvilinear incision was made just off the midline to the antibiotics of vancomycin the superficial layer of this was dissected out and divided longitudinally the avascular painting Circle mastoid samples was  developed down to the prevertebral fascia. The prevertebral fascia was dissected away with Kitners. I immediately identified the old anterior cervical plate dissected this free and remove the plate screws. Utilizing the prior date I was able to identify the appropriate 2 disc spaces above it. Large antrostomy bites partially calcified disc or medially apparent at C4-5 these were removed with Leksell rongeur. An extensive amount of disc had herniated subligamentous and causing severe spinal cord compression thecal sac compression this was teased away with a nerve hook and a 1 and 2 mm Kerrison punch. Aggressive and abutting both endplates decompressed central canal removing the PLL in piecemeal fashion allowed indication thecal sac margin laterally both C5 nerve roots were skeletonized flush with the pedicle. After adequate decompression achieved at this level the C3-4 level was decompressed in a similar fashion. Again the disc was degenerated with a subligamentous herniation which was removed. Aggressive and abutting both end plates also decompress the central canal. Both C4 nerve roots to skeletonized flush with the pedicle. After adequate decompression achieved both disc spaces were prepared and irrigated medical's hemostasis was maintained to allograft wedges were inserted C34 6 millimeter lordotic and C4-5 8 mm lordotic. Than 32 mm globus extend plate was placed utilizing the previous screws in C5 I drilled screws at C3 and C4 all screws excellent purchase locking mechanisms were engaged wasn't cope see her again fixing states was maintained and the wound was closed in layers with interrupted Vicryl in the platysma and a running 4 subcuticular Dermabond benzoin Steri-Strips and sterile dressing were applied patient to recovery room in stable condition. At the L sponge counts were correct.

## 2014-10-25 NOTE — Anesthesia Postprocedure Evaluation (Signed)
  Anesthesia Post-op Note  Patient: Alicia Bolton  Procedure(s) Performed: Procedure(s): CERVICAL THREE-FOUR, CERVICAL FOUR FIVE ANTERIOR CERVICAL DECOMPRESSION/DISCECTOMY FUSION 2 LEVEL/HARDWARE REMOVALCERVICAL FIVE-SEVEN (N/A) CARPAL TUNNEL RELEASE (Left)  Patient Location: PACU  Anesthesia Type:General  Level of Consciousness: awake, oriented, sedated and patient cooperative  Airway and Oxygen Therapy: Patient Spontanous Breathing  Post-op Pain: moderate  Post-op Assessment: Post-op Vital signs reviewed, Patient's Cardiovascular Status Stable, Respiratory Function Stable, Patent Airway, No signs of Nausea or vomiting and Pain level controlled  Post-op Vital Signs: stable  Last Vitals:  Filed Vitals:   10/25/14 1532  BP: 148/95  Pulse: 95  Temp: 36.7 C  Resp: 16    Complications: No apparent anesthesia complications

## 2014-10-25 NOTE — H&P (Signed)
Alicia Bolton is an 57 y.o. female.   Chief Complaint: Neck pain left hand numbness weakness and pain HPI: Patient is a very pleasant 58 year female whose had previous neck surgery from C5-C7 who initially did very well however last several weeks months and years Aggressive worsening neck pain into both shoulders pedicle on the left staying the top or shoulder and occasionally down toward deltoid. She's also been experiencing numbness tingling and pain in the first 3 fingers of her left hand. Workup with the neck revealed severe cervical spondylosis kyphosis and deformity at C3-4 and C4-5. Her previous fusion was solid and in addition she had an EMG nerve conduction test that showed median nerve entrapment at the wrist. So I recommended due to her failure conservative treatment anterior cervical discectomies and fusion at C3-4 and C4-5 with an exploration of fusion removal of hardware C5-C7 and then a left carpal tunnel release. I've extensively reviewed the risks and benefits of the operation with the patient as well as perioperative course expectations of outcome alternatives surgery and she understands and agrees to proceed forward.  Past Medical History  Diagnosis Date  . Hypertension   . Migraines   . Smoker   . Neuromuscular disorder   . PONV (postoperative nausea and vomiting)   . Tear of medial meniscus of right knee 10/09/2013  . Arthritis     Past Surgical History  Procedure Laterality Date  . Cervical fusion  2007  . Cesarean section  1985  . Abdominal hysterectomy  1997  . Cholecystectomy  1998  . Knee arthroscopy Right 1999  . Colonoscopy    . Knee arthroscopy with medial menisectomy Right 10/09/2013    Procedure: RIGHT KNEE ARTHROSCOPY WITH MEDIAL MENISECTOMY;  Surgeon: Alicia Bridge, MD;  Location: [facility];  Service: Orthopedics;  Laterality: Right;  clean    History reviewed. No pertinent family history. Social History:  reports that she has been  smoking Cigarettes.  She has been smoking about 0.00 packs per day. She has never used smokeless tobacco. She reports that she drinks alcohol. She reports that she does not use illicit drugs.  Allergies:  Allergies  Allergen Reactions  . Floxin [Ofloxacin]     Makes sick to stomach and vomit  . Penicillins Rash    Medications Prior to Admission  Medication Sig Dispense Refill  . amLODipine (NORVASC) 10 MG tablet TAKE ONE TABLET BY MOUTH EVERY DAY (Patient taking differently: Take 10 mg by mouth daily. ) 30 tablet 11  . celecoxib (CELEBREX) 100 MG capsule Take 1 capsule (100 mg total) by mouth 2 (two) times daily. 60 capsule 3  . cloNIDine (CATAPRES) 0.1 MG tablet Take 1 tablet (0.1 mg total) by mouth 2 (two) times daily. 60 tablet 11  . methocarbamol (ROBAXIN) 750 MG tablet Take 750 mg by mouth every 8 (eight) hours as needed for muscle spasms.     . SUMAtriptan (IMITREX) 100 MG tablet TAKE ONE TABLET BY MOUTH X1 AS NEEDED (Patient taking differently: Take 100 mg by mouth every 2 (two) hours as needed for migraine. ) 9 tablet 3  . HYDROcodone-acetaminophen (NORCO) 5-325 MG per tablet Take 1 tablet by mouth every 6 (six) hours as needed for moderate pain. 20 tablet 0    No results found for this or any previous visit (from the past 48 hour(s)). No results found.  Review of Systems  Constitutional: Negative.   Eyes: Negative.   Respiratory: Negative.   Cardiovascular: Negative.  Gastrointestinal: Negative.   Musculoskeletal: Positive for myalgias, joint pain and neck pain.  Skin: Negative.   Neurological: Negative.   Endo/Heme/Allergies: Negative.   Psychiatric/Behavioral: Negative.     Blood pressure 120/84, pulse 84, temperature 98 F (36.7 C), temperature source Oral, resp. rate 18, height 5\' 6"  (1.676 m), weight 69.854 kg (154 lb), SpO2 100 %. Physical Exam  Constitutional: She is oriented to person, place, and time. She appears well-developed and well-nourished.  HENT:   Head: Normocephalic.  Eyes: Pupils are equal, round, and reactive to light.  Neck: Normal range of motion.  Respiratory: Effort normal and breath sounds normal.  GI: Soft. Bowel sounds are normal.  Neurological: She is oriented to person, place, and time. She has normal strength. GCS eye subscore is 4. GCS verbal subscore is 5. GCS motor subscore is 6.  Strength 5 out of 5 in her deltoids, biceps, triceps, wrist flexion, wrist extension, hand intrinsics.  Skin: Skin is warm and dry.     Assessmen26 year old female presents for an ACDF at C3-4 C4-5 and a left carpal tunnel release  Alicia Bolton P 10/25/2014, 10:26 AM

## 2014-10-25 NOTE — Anesthesia Preprocedure Evaluation (Addendum)
Anesthesia Evaluation  Patient identified by MRN, date of birth, ID band Patient awake    Reviewed: Allergy & Precautions, H&P , NPO status , Patient's Chart, lab work & pertinent test results  History of Anesthesia Complications (+) PONV  Airway Mallampati: I  TM Distance: >3 FB Neck ROM: Full    Dental  (+) Teeth Intact, Dental Advisory Given   Pulmonary Current Smoker,          Cardiovascular hypertension,     Neuro/Psych  Headaches,  Neuromuscular disease    GI/Hepatic   Endo/Other    Renal/GU      Musculoskeletal  (+) Arthritis -,   Abdominal   Peds  Hematology   Anesthesia Other Findings Herpes  Reproductive/Obstetrics                            Anesthesia Physical Anesthesia Plan  ASA: II  Anesthesia Plan: General   Post-op Pain Management:    Induction: Intravenous  Airway Management Planned: Oral ETT  Additional Equipment:   Intra-op Plan:   Post-operative Plan: Extubation in OR  Informed Consent: I have reviewed the patients History and Physical, chart, labs and discussed the procedure including the risks, benefits and alternatives for the proposed anesthesia with the patient or authorized representative who has indicated his/her understanding and acceptance.   Dental advisory given  Plan Discussed with: Anesthesiologist, CRNA and Surgeon  Anesthesia Plan Comments:        Anesthesia Quick Evaluation

## 2014-10-25 NOTE — Anesthesia Procedure Notes (Signed)
Procedure Name: Intubation Date/Time: 10/25/2014 10:54 AM Performed by: Melina Copa, Waverly Tarquinio R Pre-anesthesia Checklist: Patient identified, Emergency Drugs available, Suction available, Patient being monitored and Timeout performed Patient Re-evaluated:Patient Re-evaluated prior to inductionOxygen Delivery Method: Circle system utilized Preoxygenation: Pre-oxygenation with 100% oxygen Intubation Type: IV induction Ventilation: Mask ventilation without difficulty Laryngoscope Size: Mac and 3 Grade View: Grade I Tube type: Oral Tube size: 7.5 mm Number of attempts: 1 Airway Equipment and Method: Stylet Placement Confirmation: ETT inserted through vocal cords under direct vision,  positive ETCO2 and breath sounds checked- equal and bilateral Secured at: 22 cm Tube secured with: Tape Dental Injury: Teeth and Oropharynx as per pre-operative assessment

## 2014-10-26 ENCOUNTER — Encounter (HOSPITAL_COMMUNITY): Payer: Self-pay | Admitting: Neurosurgery

## 2014-10-26 DIAGNOSIS — M47812 Spondylosis without myelopathy or radiculopathy, cervical region: Secondary | ICD-10-CM | POA: Diagnosis not present

## 2014-10-26 NOTE — Progress Notes (Signed)
Chart reviewed. Having some issues and plan is to stay overnight.  Continue to follow

## 2014-10-26 NOTE — Progress Notes (Signed)
Patient ID: Alicia Bolton, female   DOB: [birth year], 57 y.o.   MRN: [id number] Alicia Bolton is doing fine pain is still problematic in the posterior aspect of her neck and between her shoulder blades still some swelling difficulty does not feel comfortable going home  Strength 5 out of 5 wound clean dry and intact  Continue observation 24 hours discharge home tomorrow

## 2014-10-27 DIAGNOSIS — M47812 Spondylosis without myelopathy or radiculopathy, cervical region: Secondary | ICD-10-CM | POA: Diagnosis not present

## 2014-10-27 MED ORDER — OXYCODONE-ACETAMINOPHEN 5-325 MG/5ML PO SOLN: 5.0000 mL | ORAL | Status: DC | PRN

## 2014-10-27 MED ORDER — CHLORZOXAZONE 500 MG PO TABS: 250.0000 mg | ORAL_TABLET | Freq: Three times a day (TID) | ORAL | Status: DC

## 2014-10-27 NOTE — Discharge Summary (Signed)
  Physician Discharge Summary  Patient ID: Alicia Bolton MRN: [id number] DOB/AGE: [birth year] 58 y.o.  Admit date: 10/25/2014 Discharge date: 10/27/2014  Admission Diagnoses: Cervical spondylosis with stenosis C3-4 C4-5 and left carpal tunnel syndrome  Discharge Diagnoses: Same Active Problems:   Spinal stenosis of cervical region   Discharged Condition: good  Hospital Course: This gentleman the hospital underwent anterior cervical discectomy fusion C3-4 and C4-5 as well as a left carpal tunnel release postoperative patient did very well recovered in the floor on the floor she was convalescing well had some moderate amount pain it was difficult to manage in the first 24 hours and she was kept an additional day she is now stable for discharge.  Consults: Significant Diagnostic Studies: Treatments: ACDF C3-4 C4-5 left carpal tunnel release Discharge Exam: Blood pressure 131/88, pulse 80, temperature 98.4 F (36.9 C), temperature source Oral, resp. rate 16, height 5\' 6"  (1.676 m), weight 69.854 kg (154 lb), SpO2 100 %. Strength out of 5 wound clean dry and intact  Disposition: Home     Medication List    TAKE these medications        amLODipine 10 MG tablet  Commonly known as:  NORVASC  TAKE ONE TABLET BY MOUTH EVERY DAY     celecoxib 100 MG capsule  Commonly known as:  CELEBREX  Take 1 capsule (100 mg total) by mouth 2 (two) times daily.     chlorzoxazone 500 MG tablet  Commonly known as:  PARAFON FORTE DSC  Take 0.5 tablets (250 mg total) by mouth 3 (three) times daily.     cloNIDine 0.1 MG tablet  Commonly known as:  CATAPRES  Take 1 tablet (0.1 mg total) by mouth 2 (two) times daily.     HYDROcodone-acetaminophen 5-325 MG per tablet  Commonly known as:  NORCO  Take 1 tablet by mouth every 6 (six) hours as needed for moderate pain.     methocarbamol 750 MG tablet  Commonly known as:  ROBAXIN  Take 750 mg by mouth every 8 (eight) hours as needed for muscle  spasms.     oxyCODONE-acetaminophen 5-325 MG/5ML solution  Commonly known as:  ROXICET  Take 5 mLs by mouth every 4 (four) hours as needed for severe pain.     SUMAtriptan 100 MG tablet  Commonly known as:  IMITREX  TAKE ONE TABLET BY MOUTH X1 AS NEEDED           Follow-up Information    Follow up with [facility], MD.   Specialty:  Neurosurgery   Contact information:   [address] [place] [address] [phone]       Signed: Elaina Hoops 10/27/2014, 8:21 AM

## 2014-10-27 NOTE — Progress Notes (Signed)
Pt given D/C instructions with Rx's, verbal understanding was provided. Pt's incisions are clean and dry with no sign of infection. Pt's IV was removed prior to D/C. Pt D/C'd home via wheelchair @ 1050 per MD order. Pt is stable @ D/C and has no other needs at this time. Holli Humbles, RN

## 2014-10-27 NOTE — Progress Notes (Signed)
Patient ID: Alicia Bolton, female   DOB: [birth year], 58 y.o.   MRN: [id number] Doing well ready for discharge wound clean dry and intact radicular symptoms improved

## 2014-10-27 NOTE — Discharge Instructions (Signed)
Anterior and Posterior Decompressions with Fusions Decompression is a procedure performed to relieve symptoms caused by pressure or compression on the spinal cord and nerve roots. The spinal cord is a cord of nervous tissue. It extends from the brain and passes through the spinal canal (passage formed by the openings in the backbone). Nerves branch out from the spinal cord to different parts of the body. Vertebrae (backbones) are a group of individual bones. They form the spinal column. Each vertebra is made up of lamina (bony arches of the spinal canal), spine, and foramen (opening in the backbone). A disc is a soft, gel-like cushion between each vertebra.  Decompression can be carried out as an anterior or posterior procedure. Anterior decompression is where the surgeon makes an incision more toward the front of your body in order to get access to the area needing repair. Posterior decompression is where the surgeon makes an incision more toward the back of your body in order to get access to the area needing repair. Depending upon the details of your specific condition, there are reasons why the surgeon may choose one approach over the other. CAUSES Compression of spinal cord and nerves can be caused by:  Bulging or collapse of the spinal disc.  Loosening of the ligaments.  Bony growth. The causes of the spinal cord compression include:  Degenerative diseases (such as with many arthritis problems).  Rupture of the disc.  Traumatic injury.  Spinal stenosis (narrowing of the spinal canal).  Pott disease (tuberculosis of the spine). LET YOUR CAREGIVER KNOW ABOUT:   Bleeding and clotting problems.  Allergy to anesthesia medications.  Allergy to medications like steroid and blood thinners.  Previous surgeries.  Bone diseases like disease of low bone mass (osteoporosis) and infection of bone (osteomyelitis).  Cancerous (neoplastic) conditions. RISKS AND COMPLICATIONS  Bleeding and  collection of blood outside the blood vessels.  Damage to the blood vessels. This can cause heavy bleeding and even death.  Damage to the nerves. This can cause pain, loss of sensation, paralysis, and weakness.  Damage to the covering of the spinal cord (meninges). This can cause the cerebrospinal fluid to leak.  Complications involving graft and plate.  Inadequate fusion.  Wound infection. BEFORE THE PROCEDURE  Your caregiver will make sure that you have been given a reasonable trial of nonsurgical treatment methods. Your caregiver may perform an X-ray study with a dye (diskogram). The injection of a dye into a disc may produce a pain similar to your back pain. This will help your caregiver to identify the disc that is the source of pain.  PROCEDURE Your surgeon will perform spinal decompression and fusion while you are under general anesthesia. There are different methods of performing decompression surgery. They include the following:  Diskectomy. Your surgeon will remove a portion of a spinal disc that is causing pressure on the nerve roots.  Laminotomy or laminectomy. Lamina refers to bony arches of the spinal canal. Laminotomy is a procedure in which your surgeon removes a small portion of lamina. Laminectomy is the removal of an entire lamina.  Foraminotomy or foraminectomy. Foramen refers to the opening in the backbone for nerve roots. Foraminotomy is the removal of a small amount of bone and tissue forming the foramen. Foraminectomy is the removal of a large amount of bone and tissue.  Osteophyte removal. Your surgeon removes bony growths called osteophytes. These cause pressure on the spinal column.  Corpectomy. Corpus refers to the body of vertebra. This procedure involves  the removal of the body of a vertebra and also the discs.  Spinal fusion is required if there is a curvature of the spine or forward slip of a vertebra. Spinal fusion is a procedure of fusing two or more  vertebrae together with a bone graft (new bone or substitute material from your body). This is further strengthened by using screws, plate, and cage apparatus.  Fusion prevents motion between 2 vertebrae. It also prevents the curvature of the spine and slip of vertebra from getting worse after surgery. AFTER THE PROCEDURE   You will be moved to the recovery room and then to the hospital room.  You will have a thin tube (catheter) inserted into the urinary bladder to help in urination.  Your may have pain for the first few days after the surgery. Your caregiver will give you medications to control pain.  Your caregiver may order blood tests to monitor oxygen carrying protein of the red blood cells (hemoglobin). This would be due to blood loss during the surgery. Hemoglobin should be monitored to make sure that the level of blood oxygen does not become too low.  You will be given a back brace. A back brace limits motion and helps fusion of the bone.  You may continue to have mild pain even after full recovery.  You may have a series X-ray examinations over time. This will ensure adequate healing and appropriate alignment at the site of operation. HOME CARE INSTRUCTIONS   To get strength and function, start physical therapy, occupational therapy, and exercises.  To ease the pain, you may have to exercise regularly. That also helps in weight loss.  To keep your spine in proper alignment, you should sit, stand, walk, turn in bed, and reposition yourself as instructed.  At first, take only short walks. Slowly increase other activities.  Avoid smoking. Nicotine inhibits fusion of the bone.  If narcotics (pain medication) are prescribed, you should not drink alcohol. You should not drive when you are on this medication because you will feel drowsy.  Avoid use of pain medication products and nonsteroidal anti-inflammatory agents (pain medication). They interfere with the development and growth  of new bone cells.  Your caregiver may recommend using ice to manage pain. Ask your caregiver for instructions on how to do it.  Avoid lifting heavy objects.  Avoid bending and twisting motions. SEEK MEDICAL CARE IF:   You have increased pain.  There is expanding redness at the operative site.  You have a fever. SEEK IMMEDIATE MEDICAL CARE IF:   You have any discomfort with the substitute material that has been used during the spinal fusion procedure.  You feel a collection of blood outside the blood vessels.  You notice any changes in the smell, appearance, or amount of drainage. Document Released: 11/04/2007 Document Revised: 03/01/2014 Document Reviewed: 03/06/2008 [facility] Patient Information 2015 [place], [place]. This information is not intended to replace advice given to you by your health care provider. Make sure you discuss any questions you have with your health care provider.   Carpal Tunnel Release Carpal tunnel release is done to relieve the pressure on the nerves and tendons on the bottom side of your wrist.  LET YOUR CAREGIVER KNOW ABOUT:   Allergies to food or medicine.  Medicines taken, including vitamins, herbs, eyedrops, over-the-counter medicines, and creams.  Use of steroids (by mouth or creams).  Previous problems with anesthetics or numbing medicines.  History of bleeding problems or blood clots.  Previous surgery.  Other health problems, including diabetes and kidney problems.  Possibility of pregnancy, if this applies. RISKS AND COMPLICATIONS  Some problems that may happen after this procedure include:  Infection.  Damage to the nerves, arteries or tendons could occur. This would be very uncommon.  Bleeding. BEFORE THE PROCEDURE   This surgery may be done while you are asleep (general anesthetic) or may be done under a block where only your forearm and the surgical area is numb.  If the surgery is done under a block, the numbness will  gradually wear off within several hours after surgery. HOME CARE INSTRUCTIONS   Have a responsible person with you for 24 hours.  Do not drive a car or use public transportation for 24 hours.  Only take over-the-counter or prescription medicines for pain, discomfort, or fever as directed by your caregiver. Take them as directed.  You may put ice on the palm side of the affected wrist.  Put ice in a plastic bag.  Place a towel between your skin and the bag.  Leave the ice on for 20 to 30 minutes, 4 times per day.  If you were given a splint to keep your wrist from bending, use it as directed. It is important to wear the splint at night or as directed. Use the splint for as long as you have pain or numbness in your hand, arm, or wrist. This may take 1 to 2 months.  Keep your hand raised (elevated) above the level of your heart as much as possible. This keeps swelling down and helps with discomfort.  Change bandages (dressings) as directed.  Keep the wound clean and dry. SEEK MEDICAL CARE IF:   You develop pain not relieved with medications.  You develop numbness of your hand.  You develop bleeding from your surgical site.  You have an oral temperature above 102 F (38.9 C).  You develop redness or swelling of the surgical site.  You develop new, unexplained problems. SEEK IMMEDIATE MEDICAL CARE IF:   You develop a rash.  You have difficulty breathing.  You develop any reaction or side effects to medications given. Document Released: 01/05/2004 Document Revised: 01/07/2012 Document Reviewed: 08/21/2007 [facility] Patient Information 2015 [place], [place]. This information is not intended to replace advice given to you by your health care provider. Make sure you discuss any questions you have with your health care provider.    Wound Care Keep incision covered and dry for one week.  If you shower prior to then, cover incision with plastic wrap.  You may remove outer  bandage after one week and shower.  Do not put any creams, lotions, or ointments on incision. Leave steri-strips on neck.  They will fall off by themselves. Activity Walk each and every day, increasing distance each day. No lifting greater than 5 lbs.  Avoid excessive neck motion. No driving for 2 weeks; may ride as a passenger locally. Wear neck brace at all times except when showering or otherwise instructed. Diet Resume your normal diet.  Return to Work Will be discussed at you follow up appointment. Call Your Doctor If Any of These Occur Redness, drainage, or swelling at the wound.  Temperature greater than 101 degrees. Severe pain not relieved by pain medication. Increased difficulty swallowing.  Incision starts to come apart. Follow Up Appt Call today for appointment in 1-2 weeks ([phone]) or for problems.  If you have any hardware placed in your spine, you will need an x-ray before your appointment.

## 2014-11-10 ENCOUNTER — Telehealth: Payer: Self-pay | Admitting: Family Medicine

## 2014-11-10 ENCOUNTER — Encounter: Payer: Self-pay | Admitting: Neurosurgery

## 2014-11-10 NOTE — Op Note (Signed)
Preoperative diagnosis: Left carpal tunnel syndrome  Postoperative diagnosis: Same  Procedure:left carpal tunnel release  Surgeon: Kary Kos  Anesthesia: Gen.  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  Due to the patient's failure conservative treatment and EMG documentation I recommended left carpal tunnel release extensive over the risks and benefits of the procedure with the patient as well as perioperative course expectations of outcome and alternatives of surgery and they understood and agreed to proceed  Forward.  Operative procedure: This is the second stage of a two-stage procedure were after I perform the ACDF we prepped and draped the patient's left hand in routine sterile fashion for a left carpal tunnel release.  After adequate anesthesia been achieved and confirmed an incision was drawn out approximately 4 cm long from the distal crease of the wrist along the palmar crease towards middle finger.  This was then infiltrated with 1% lidocaine with epi and incised.  Dissection was carried down to the transverse carpal ligament and flexor retinaculum.  This was then divided sequentially in layers until I was able to identify the epineurium of the median nerve.  Then using a hemostat and developing the plane from the flexor retinaculum and transverse carpal ligament from the median nerve I incised the transverse carpal ligament proximally and distally.  I continued until I felt no further resistance along the pathway of the median nerve.  Copious irrigation and meticulous hemostasis was maintained and then the wound was closed in layers with an interrupted vertical mattress in the skin only.  The wound was then dressed the hand was wrapped.at the end of the case all needle counts sponge counts were correct and patient will  recovery room in stable condition.

## 2014-11-10 NOTE — Telephone Encounter (Signed)
[phone] Patient had neck surgery 2 week ago, and is trying to wean herself off of pain meds, has some questions about some of her other meds  Would like you to call her

## 2014-11-10 NOTE — Telephone Encounter (Signed)
Call placed to patient.   Reports that she was given Percocet and Norco after surgery.   States that she would like to have liver function tests again to monitor if her liver labs have improved. Advised that she would need to be off APAP containing meds x10 days at least before labs.

## 2014-11-22 ENCOUNTER — Encounter: Payer: Self-pay | Admitting: Family Medicine

## 2014-12-29 ENCOUNTER — Other Ambulatory Visit: Payer: Self-pay

## 2015-01-10 ENCOUNTER — Other Ambulatory Visit: Payer: Self-pay | Admitting: Family Medicine

## 2015-01-10 MED ORDER — VALACYCLOVIR HCL 500 MG PO TABS: 500.0000 mg | ORAL_TABLET | Freq: Every day | ORAL | Status: DC

## 2015-01-10 NOTE — Telephone Encounter (Signed)
Med sent to requested pharm

## 2015-01-21 ENCOUNTER — Ambulatory Visit (INDEPENDENT_AMBULATORY_CARE_PROVIDER_SITE_OTHER): Payer: 59 | Admitting: *Deleted

## 2015-01-21 DIAGNOSIS — Z23 Encounter for immunization: Secondary | ICD-10-CM | POA: Diagnosis not present

## 2015-03-22 ENCOUNTER — Ambulatory Visit (INDEPENDENT_AMBULATORY_CARE_PROVIDER_SITE_OTHER): Payer: 59 | Admitting: Family Medicine

## 2015-03-22 ENCOUNTER — Encounter: Payer: Self-pay | Admitting: Family Medicine

## 2015-03-22 VITALS — BP 128/74 | HR 68 | Temp 98.3°F | Resp 14 | Ht 65.0 in | Wt 155.0 lb

## 2015-03-22 DIAGNOSIS — E041 Nontoxic single thyroid nodule: Secondary | ICD-10-CM

## 2015-03-22 DIAGNOSIS — K219 Gastro-esophageal reflux disease without esophagitis: Secondary | ICD-10-CM | POA: Diagnosis not present

## 2015-03-22 DIAGNOSIS — R945 Abnormal results of liver function studies: Secondary | ICD-10-CM

## 2015-03-22 DIAGNOSIS — R7989 Other specified abnormal findings of blood chemistry: Secondary | ICD-10-CM | POA: Diagnosis not present

## 2015-03-22 LAB — HEPATIC FUNCTION PANEL
ALBUMIN: 4 g/dL (ref 3.5–5.2)
ALT: <8 U/L (ref 0–35)
AST: 10 U/L (ref 0–37)
Alkaline Phosphatase: 82 U/L (ref 39–117)
BILIRUBIN DIRECT: 0.1 mg/dL (ref 0.0–0.3)
BILIRUBIN TOTAL: 0.4 mg/dL (ref 0.2–1.2)
Indirect Bilirubin: 0.3 mg/dL (ref 0.2–1.2)
TOTAL PROTEIN: 6.9 g/dL (ref 6.0–8.3)

## 2015-03-22 LAB — T3, FREE: T3 FREE: 2.8 pg/mL (ref 2.3–4.2)

## 2015-03-22 LAB — TSH: TSH: 1.165 u[IU]/mL (ref 0.350–4.500)

## 2015-03-22 LAB — T4, FREE: Free T4: 0.93 ng/dL (ref 0.80–1.80)

## 2015-03-22 MED ORDER — VALACYCLOVIR HCL 500 MG PO TABS: 500.0000 mg | ORAL_TABLET | Freq: Every day | ORAL | Status: DC

## 2015-03-22 MED ORDER — SUMATRIPTAN SUCCINATE 100 MG PO TABS: 100.0000 mg | ORAL_TABLET | ORAL | Status: DC | PRN

## 2015-03-22 MED ORDER — CELECOXIB 100 MG PO CAPS: 100.0000 mg | ORAL_CAPSULE | Freq: Two times a day (BID) | ORAL | Status: DC

## 2015-03-22 NOTE — Patient Instructions (Signed)
Thyroid levels to be drawn  Ultrasound to be set up Take the nexium as prescribed F/U as needed

## 2015-03-22 NOTE — Progress Notes (Signed)
Patient ID: Alicia Bolton, female   DOB: 09-May-1956, 59 y.o.   MRN: [id number]   Subjective:    Patient ID: Alicia Bolton, female    DOB: [birth year], 59 y.o.   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 She had an MRI done by her neurosurgeon to prepare her for one of her neck surgeries. She was gathering some medical records together when she noted that it stated thyroid cyst on the left side. She was concerned because her father had thyroid or either throat cancer. Denies any symptoms of hypo-or hyperthyroidism. She did have a TSH done in 2014  that was normal. She would like to have her thyroid evaluated  He is also due for repeat liver function tests that she had elevated LFTs with negative hepatitis screen and fairly benign abdominal ultrasound. At the end of the visit she also states that she gets acid reflux when she eats or drinks something she would like to try different medication for this and set up an over-the-counter meds.  Review Of Systems:  GEN- denies fatigue, fever, weight loss,weakness, recent illness HEENT- denies eye drainage, change in vision, nasal discharge, CVS- denies chest pain, palpitations RESP- denies SOB, cough, wheeze ABD- denies N/V, change in stools, abd pain GU- denies dysuria, hematuria, dribbling, incontinence MSK- + joint pain, muscle aches, injury Neuro- denies headache, dizziness, syncope, seizure activity       Objective:    BP 128/74 mmHg  Pulse 68  Temp(Src) 98.3 F (36.8 C) (Oral)  Resp 14  Ht 5\' 5"  (1.651 m)  Wt 155 lb (70.308 kg)  BMI 25.79 kg/m2 GEN- NAD, alert and oriented x3 HEENT- PERRL, EOMI, non injected sclera, pink conjunctiva, MMM, oropharynx clear Neck- Supple, mild enlargement on left side, no nodule or mass palpated,well healed surgical scars CVS- RRR, no murmur RESP-CTAB Pulses- Radial, DP- 2+        Assessment & Plan:      Problem List Items Addressed This Visit    None    Visit Diagnoses    Thyroid cyst    -  Primary    [place] and TFT to be done, / family history of thyroid cancer    Relevant Orders    TSH    T3, free    T4, free    US Soft Tissue Head/Neck    Gastroesophageal reflux disease without esophagitis        Trial of nexium 40mg  given from ofice, advised food with celebrex as well     Relevant Medications    esomeprazole (NEXIUM) 40 MG capsule    Elevated LFTs           Note: This dictation was prepared with Dragon dictation along with smaller phrase technology. Any transcriptional errors that result from this process are unintentional.

## 2015-03-23 ENCOUNTER — Other Ambulatory Visit: Payer: Self-pay

## 2015-03-23 DIAGNOSIS — Z1231 Encounter for screening mammogram for malignant neoplasm of breast: Secondary | ICD-10-CM

## 2015-03-29 ENCOUNTER — Ambulatory Visit
Admission: RE | Admit: 2015-03-29 | Discharge: 2015-03-29 | Disposition: A | Discharge status: Home / Self Care | Payer: Self-pay | Source: Ambulatory Visit | Attending: Family Medicine | Admitting: Family Medicine

## 2015-03-29 ENCOUNTER — Other Ambulatory Visit: Payer: Self-pay

## 2015-03-29 ENCOUNTER — Ambulatory Visit: Payer: Self-pay

## 2015-03-29 DIAGNOSIS — E041 Nontoxic single thyroid nodule: Secondary | ICD-10-CM

## 2015-03-30 ENCOUNTER — Other Ambulatory Visit: Payer: Self-pay | Admitting: *Deleted

## 2015-03-30 DIAGNOSIS — R221 Localized swelling, mass and lump, neck: Secondary | ICD-10-CM

## 2015-04-12 ENCOUNTER — Other Ambulatory Visit: Payer: Self-pay | Admitting: Family Medicine

## 2015-04-12 DIAGNOSIS — R221 Localized swelling, mass and lump, neck: Secondary | ICD-10-CM

## 2015-04-13 ENCOUNTER — Other Ambulatory Visit: Payer: Self-pay | Admitting: Family Medicine

## 2015-04-13 ENCOUNTER — Ambulatory Visit
Admission: RE | Admit: 2015-04-13 | Discharge: 2015-04-13 | Disposition: A | Discharge status: Home / Self Care | Payer: 59 | Source: Ambulatory Visit | Attending: Family Medicine | Admitting: Family Medicine

## 2015-04-13 DIAGNOSIS — R221 Localized swelling, mass and lump, neck: Secondary | ICD-10-CM

## 2015-04-13 MED ORDER — GADOBENATE DIMEGLUMINE 529 MG/ML IV SOLN
15.0000 mL | Freq: Once | INTRAVENOUS | Status: AC | PRN
  Administered 2015-04-13: 15 mL via INTRAVENOUS

## 2015-04-15 ENCOUNTER — Encounter: Payer: Self-pay | Admitting: Family Medicine

## 2015-04-15 ENCOUNTER — Ambulatory Visit (INDEPENDENT_AMBULATORY_CARE_PROVIDER_SITE_OTHER): Payer: 59 | Admitting: Family Medicine

## 2015-04-15 ENCOUNTER — Ambulatory Visit: Payer: Self-pay | Admitting: Family Medicine

## 2015-04-15 VITALS — BP 128/70 | HR 84 | Temp 98.4°F | Resp 12 | Ht 65.0 in | Wt 155.0 lb

## 2015-04-15 DIAGNOSIS — R221 Localized swelling, mass and lump, neck: Secondary | ICD-10-CM

## 2015-04-15 DIAGNOSIS — R222 Localized swelling, mass and lump, trunk: Secondary | ICD-10-CM

## 2015-04-15 NOTE — Patient Instructions (Signed)
CT chest with IV Contrast  F/U pending results

## 2015-04-17 DIAGNOSIS — R222 Localized swelling, mass and lump, trunk: Secondary | ICD-10-CM | POA: Insufficient documentation

## 2015-04-17 DIAGNOSIS — R221 Localized swelling, mass and lump, neck: Secondary | ICD-10-CM | POA: Insufficient documentation

## 2015-04-17 HISTORY — DX: Localized swelling, mass and lump, trunk: R22.2

## 2015-04-17 NOTE — Assessment & Plan Note (Signed)
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 She will then be seen by specialist pending the CT most likley thoracic surgeon.  Differentials per above in impression for original MRI

## 2015-04-17 NOTE — Progress Notes (Signed)
Patient ID: Alicia Bolton, female   DOB: June 22, 1956, 59 y.o.   MRN: [id number]   Subjective:    Patient ID: Alicia Bolton, female    DOB: [birth year], 59 y.o.   MRN: [id number]  Patient presents for Discuss MRI Results  Pt here to discuss MRI results. Last visit she brought in old MRI concern for thyroid mass, [place] was done showing concern for either neck mass vs exo-thyroidal mass, MRI neck to further review the mass the impression:  . 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 The epicenter of the mass is at the anterior superior mediastinum, and it appears largely although perhaps not entirely separate from the left thyroid lobe. Caudal extension into the mediastinum is nearly but not entirely visualized on this study. Recommend a followup chest CT with IV contrast fully characterize. Top differential considerations at this point include mediastinal teratoma, cystic thymoma, or less likely large exophytic multi-cystic thyroid goiter or mixed venolymphatic vascular Malformation.   This appears to date back to 2011 after review with radiology and most likley benign appearing. This was not mentioned on any chest xrays or other MRI of neck back to 2011 just seen on his review of old MRI   - She does admit to having some trouble swallowing on and off, but denies any SOB, chest pain       Review Of Systems:  GEN- denies fatigue, fever, weight loss,weakness, recent illness HEENT- denies eye drainage, change in vision, nasal discharge, CVS- denies chest pain, palpitations RESP- denies SOB, cough, wheeze ABD- denies N/V, change in stools, abd pain GU- denies dysuria, hematuria, dribbling, incontinence MSK- denies joint pain, muscle aches, injury Neuro- denies headache, dizziness, syncope, seizure activity       Objective:    BP 128/70 mmHg  Pulse 84  Temp(Src) 98.4 F (36.9 C) (Oral)  Resp 12  Ht 5\' 5"  (1.651 m)  Wt 155 lb (70.308 kg)   BMI 25.79 kg/m2 GEN- NAD, alert and oriented x3         Assessment & Plan:      Problem List Items Addressed This Visit    None      Note: This dictation was prepared with Dragon dictation along with smaller phrase technology. Any transcriptional errors that result from this process are unintentional.

## 2015-04-19 ENCOUNTER — Other Ambulatory Visit: Payer: Self-pay | Admitting: Family Medicine

## 2015-04-19 ENCOUNTER — Telehealth: Payer: Self-pay | Admitting: *Deleted

## 2015-04-19 DIAGNOSIS — R222 Localized swelling, mass and lump, trunk: Secondary | ICD-10-CM

## 2015-04-19 DIAGNOSIS — R221 Localized swelling, mass and lump, neck: Secondary | ICD-10-CM

## 2015-04-19 NOTE — Telephone Encounter (Signed)
Romie Minus from [facility] called stating that pt is coming in tomorrow June 22 for CT chest for dx of neck mass and chest mass, Romie Minus states the order needs to be changed to CT chest w/cm and CT neck with cm, states can not do just one of chest and part of neck has to be both.   I place the order for both per protocol thru [facility].

## 2015-04-20 ENCOUNTER — Other Ambulatory Visit: Payer: Self-pay | Admitting: Family Medicine

## 2015-04-20 ENCOUNTER — Ambulatory Visit
Admission: RE | Admit: 2015-04-20 | Discharge: 2015-04-20 | Disposition: A | Discharge status: Home / Self Care | Payer: 59 | Source: Ambulatory Visit | Attending: Family Medicine | Admitting: Family Medicine

## 2015-04-20 DIAGNOSIS — R222 Localized swelling, mass and lump, trunk: Secondary | ICD-10-CM

## 2015-04-20 DIAGNOSIS — J9859 Other diseases of mediastinum, not elsewhere classified: Secondary | ICD-10-CM

## 2015-04-20 DIAGNOSIS — R221 Localized swelling, mass and lump, neck: Secondary | ICD-10-CM

## 2015-04-20 MED ORDER — IOPAMIDOL (ISOVUE-300) INJECTION 61%
75.0000 mL | Freq: Once | INTRAVENOUS | Status: AC | PRN
  Administered 2015-04-20: 75 mL via INTRAVENOUS

## 2015-04-25 ENCOUNTER — Telehealth: Payer: Self-pay | Admitting: Family Medicine

## 2015-04-25 NOTE — Telephone Encounter (Signed)
Pt came into office and we spoke and she has her results from her MRI and CT scan pt is aware that he referral has been sent to [facility]

## 2015-04-25 NOTE — Telephone Encounter (Signed)
[phone] Pt is calling to speak to you about a referral that was being worked on for [facility]

## 2015-04-26 ENCOUNTER — Encounter: Payer: 59 | Admitting: Cardiothoracic Surgery

## 2015-04-29 ENCOUNTER — Encounter: Payer: 59 | Admitting: Cardiothoracic Surgery

## 2015-04-29 HISTORY — PX: CHEST SURGERY: SHX595

## 2015-05-20 ENCOUNTER — Telehealth: Payer: Self-pay | Admitting: *Deleted

## 2015-05-20 NOTE — Telephone Encounter (Signed)
Noted, agree with ER visit

## 2015-05-20 NOTE — Telephone Encounter (Signed)
Received call from patient.   Reports that she has been home from hospital S/P removal of mass x4 days. States that she has been experiencing side effects, but she is not sure from what.   Reports that on Wed, she awoke to B hands and feet swelling and itching. She took a Benadryl for itching and noted that she began to have whelps on her face. Was advised by pharmacy to stop taking benadryl.   States that she woke up on Thursday with her bottom lip swollen and whelps to face again. States that edema and whelps did go down as the day progressed. Reports that she woke up today with both lips swollen and whelps are now noted to face, arms and thighs.   Advised by triage and writer to go to ER. Patient reports that she is not supposed to go to [facility] d/t some legal issues. Advised to go to any ER, even if it is not [facility].   MD to be made aware.

## 2015-07-08 ENCOUNTER — Other Ambulatory Visit: Payer: Self-pay | Admitting: Family Medicine

## 2015-07-08 NOTE — Telephone Encounter (Signed)
Antibiotic refill denied to pharmacy

## 2015-08-16 ENCOUNTER — Telehealth: Payer: Self-pay | Admitting: Family Medicine

## 2015-08-16 NOTE — Telephone Encounter (Signed)
Call placed to patient. LMTRC.  

## 2015-08-16 NOTE — Telephone Encounter (Signed)
Returned call to patient.   States that she would like to come in to F/U with MD in regards to surgery, etc, but she has since been laid off work and will need to pay cash. Requested ballpark figure for OV.   Advised that OV cost is determined by how MD codes visit, and by what needs to be done during visit. Requested to have office manager look and see if we could come up with projected cost and return call to her.   Please advise.

## 2015-08-16 NOTE — Telephone Encounter (Signed)
Patient would like to speak with you regarding a recent procedure she had done [phone]

## 2015-08-26 NOTE — Telephone Encounter (Signed)
Received call from patient.  States that she has not heard from office manager in regards to F/U appointment.  Please contact patient.

## 2015-08-30 NOTE — Telephone Encounter (Signed)
I called patient and advised her that we would bill her as self pay. I advised her that when she is a self pay patient that [facility] gives her a 55% discount before she ever receives a bill from [place]. I did tell her that we typically request patient to pay $80.00 at time of service however we would work with her if she didn't have that. I have made her a follow up appointment.

## 2015-09-02 ENCOUNTER — Ambulatory Visit: Payer: 59 | Admitting: Family Medicine

## 2015-09-15 DIAGNOSIS — M5023 Other cervical disc displacement, cervicothoracic region: Secondary | ICD-10-CM | POA: Diagnosis not present

## 2015-09-15 DIAGNOSIS — M542 Cervicalgia: Secondary | ICD-10-CM | POA: Diagnosis not present

## 2015-09-15 DIAGNOSIS — M5137 Other intervertebral disc degeneration, lumbosacral region: Secondary | ICD-10-CM | POA: Diagnosis not present

## 2015-09-20 ENCOUNTER — Inpatient Hospital Stay: Payer: 59 | Admitting: Family Medicine

## 2015-09-26 ENCOUNTER — Other Ambulatory Visit: Payer: Self-pay | Admitting: Family Medicine

## 2015-09-26 NOTE — Telephone Encounter (Signed)
Refill appropriate and filled per protocol. 

## 2015-09-27 ENCOUNTER — Encounter: Payer: Self-pay | Admitting: Family Medicine

## 2015-09-29 DIAGNOSIS — I639 Cerebral infarction, unspecified: Secondary | ICD-10-CM

## 2015-09-29 HISTORY — DX: Cerebral infarction, unspecified: I63.9

## 2015-09-30 ENCOUNTER — Inpatient Hospital Stay: Payer: 59 | Admitting: Family Medicine

## 2015-10-02 ENCOUNTER — Inpatient Hospital Stay (HOSPITAL_COMMUNITY)
Admission: EM | Admit: 2015-10-02 | Discharge: 2015-10-06 | DRG: 041 | Disposition: A | Discharge status: Home Health | Payer: Medicare Other | Attending: Neurology | Admitting: Neurology

## 2015-10-02 ENCOUNTER — Encounter (HOSPITAL_COMMUNITY): Payer: Self-pay

## 2015-10-02 ENCOUNTER — Emergency Department (HOSPITAL_COMMUNITY): Payer: Medicare Other

## 2015-10-02 DIAGNOSIS — R29708 NIHSS score 8: Secondary | ICD-10-CM | POA: Diagnosis present

## 2015-10-02 DIAGNOSIS — E785 Hyperlipidemia, unspecified: Secondary | ICD-10-CM | POA: Diagnosis present

## 2015-10-02 DIAGNOSIS — G451 Carotid artery syndrome (hemispheric): Secondary | ICD-10-CM | POA: Diagnosis not present

## 2015-10-02 DIAGNOSIS — M6281 Muscle weakness (generalized): Secondary | ICD-10-CM | POA: Diagnosis not present

## 2015-10-02 DIAGNOSIS — G8194 Hemiplegia, unspecified affecting left nondominant side: Secondary | ICD-10-CM | POA: Diagnosis present

## 2015-10-02 DIAGNOSIS — R002 Palpitations: Secondary | ICD-10-CM | POA: Diagnosis not present

## 2015-10-02 DIAGNOSIS — R2990 Unspecified symptoms and signs involving the nervous system: Secondary | ICD-10-CM

## 2015-10-02 DIAGNOSIS — R29818 Other symptoms and signs involving the nervous system: Secondary | ICD-10-CM | POA: Diagnosis not present

## 2015-10-02 DIAGNOSIS — I10 Essential (primary) hypertension: Secondary | ICD-10-CM | POA: Diagnosis not present

## 2015-10-02 DIAGNOSIS — I4891 Unspecified atrial fibrillation: Secondary | ICD-10-CM | POA: Diagnosis present

## 2015-10-02 DIAGNOSIS — I63422 Cerebral infarction due to embolism of left anterior cerebral artery: Secondary | ICD-10-CM | POA: Diagnosis not present

## 2015-10-02 DIAGNOSIS — I638 Other cerebral infarction: Secondary | ICD-10-CM | POA: Diagnosis not present

## 2015-10-02 DIAGNOSIS — I6789 Other cerebrovascular disease: Secondary | ICD-10-CM | POA: Diagnosis not present

## 2015-10-02 DIAGNOSIS — F17210 Nicotine dependence, cigarettes, uncomplicated: Secondary | ICD-10-CM | POA: Diagnosis present

## 2015-10-02 DIAGNOSIS — M797 Fibromyalgia: Secondary | ICD-10-CM | POA: Diagnosis present

## 2015-10-02 DIAGNOSIS — Z981 Arthrodesis status: Secondary | ICD-10-CM | POA: Diagnosis not present

## 2015-10-02 DIAGNOSIS — I6340 Cerebral infarction due to embolism of unspecified cerebral artery: Secondary | ICD-10-CM | POA: Diagnosis not present

## 2015-10-02 DIAGNOSIS — R531 Weakness: Secondary | ICD-10-CM | POA: Diagnosis not present

## 2015-10-02 DIAGNOSIS — I639 Cerebral infarction, unspecified: Secondary | ICD-10-CM | POA: Diagnosis present

## 2015-10-02 DIAGNOSIS — R29810 Facial weakness: Secondary | ICD-10-CM | POA: Diagnosis present

## 2015-10-02 DIAGNOSIS — F17200 Nicotine dependence, unspecified, uncomplicated: Secondary | ICD-10-CM | POA: Diagnosis not present

## 2015-10-02 DIAGNOSIS — G43909 Migraine, unspecified, not intractable, without status migrainosus: Secondary | ICD-10-CM | POA: Diagnosis present

## 2015-10-02 DIAGNOSIS — I63421 Cerebral infarction due to embolism of right anterior cerebral artery: Secondary | ICD-10-CM | POA: Diagnosis not present

## 2015-10-02 DIAGNOSIS — I340 Nonrheumatic mitral (valve) insufficiency: Secondary | ICD-10-CM | POA: Diagnosis not present

## 2015-10-02 LAB — DIFFERENTIAL
BASOS ABS: 0 10*3/uL (ref 0.0–0.1)
BASOS PCT: 0 %
EOS ABS: 0.2 10*3/uL (ref 0.0–0.7)
Eosinophils Relative: 2 %
Lymphocytes Relative: 46 %
Lymphs Abs: 4.9 10*3/uL — ABNORMAL HIGH (ref 0.7–4.0)
MONOS PCT: 7 %
Monocytes Absolute: 0.7 10*3/uL (ref 0.1–1.0)
NEUTROS PCT: 45 %
Neutro Abs: 4.6 10*3/uL (ref 1.7–7.7)

## 2015-10-02 LAB — I-STAT CHEM 8, ED
BUN: 17 mg/dL (ref 6–20)
CALCIUM ION: 1.11 mmol/L — AB (ref 1.12–1.23)
CHLORIDE: 105 mmol/L (ref 101–111)
Creatinine, Ser: 0.7 mg/dL (ref 0.44–1.00)
Glucose, Bld: 106 mg/dL — ABNORMAL HIGH (ref 65–99)
HCT: 48 % — ABNORMAL HIGH (ref 36.0–46.0)
HEMOGLOBIN: 16.3 g/dL — AB (ref 12.0–15.0)
Potassium: 3.7 mmol/L (ref 3.5–5.1)
SODIUM: 141 mmol/L (ref 135–145)
TCO2: 25 mmol/L (ref 0–100)

## 2015-10-02 LAB — COMPREHENSIVE METABOLIC PANEL
ALBUMIN: 3.8 g/dL (ref 3.5–5.0)
ALT: 9 U/L — ABNORMAL LOW (ref 14–54)
AST: 17 U/L (ref 15–41)
Alkaline Phosphatase: 98 U/L (ref 38–126)
Anion gap: 10 (ref 5–15)
BUN: 14 mg/dL (ref 6–20)
CHLORIDE: 107 mmol/L (ref 101–111)
CO2: 22 mmol/L (ref 22–32)
Calcium: 9.2 mg/dL (ref 8.9–10.3)
Creatinine, Ser: 0.76 mg/dL (ref 0.44–1.00)
GFR calc Af Amer: >60 mL/min (ref >60)
Glucose, Bld: 113 mg/dL — ABNORMAL HIGH (ref 65–99)
POTASSIUM: 3.9 mmol/L (ref 3.5–5.1)
Sodium: 139 mmol/L (ref 135–145)
Total Bilirubin: 0.7 mg/dL (ref 0.3–1.2)
Total Protein: 7.5 g/dL (ref 6.5–8.1)

## 2015-10-02 LAB — I-STAT TROPONIN, ED: TROPONIN I, POC: 0 ng/mL (ref 0.00–0.08)

## 2015-10-02 LAB — CBC
HCT: 40.3 % (ref 36.0–46.0)
Hemoglobin: 13.8 g/dL (ref 12.0–15.0)
MCH: 30.4 pg (ref 26.0–34.0)
MCHC: 34.2 g/dL (ref 30.0–36.0)
MCV: 88.8 fL (ref 78.0–100.0)
Platelets: 266 10*3/uL (ref 150–400)
RBC: 4.54 MIL/uL (ref 3.87–5.11)
RDW: 16 % — AB (ref 11.5–15.5)
WBC: 10.4 10*3/uL (ref 4.0–10.5)

## 2015-10-02 LAB — APTT: APTT: 28 s (ref 24–37)

## 2015-10-02 LAB — CBG MONITORING, ED: GLUCOSE-CAPILLARY: 110 mg/dL — AB (ref 65–99)

## 2015-10-02 LAB — PROTIME-INR
INR: 0.97 (ref 0.00–1.49)
PROTHROMBIN TIME: 13.1 s (ref 11.6–15.2)

## 2015-10-02 MED ORDER — LABETALOL HCL 5 MG/ML IV SOLN: 10.0000 mg | INTRAVENOUS | Status: DC | PRN

## 2015-10-02 MED ORDER — SENNOSIDES-DOCUSATE SODIUM 8.6-50 MG PO TABS
1.0000 | ORAL_TABLET | Freq: Every evening | ORAL | Status: DC | PRN
Start: 2015-10-02 — End: 2015-10-06
  Filled 2015-10-02: qty 1

## 2015-10-02 MED ORDER — STROKE: EARLY STAGES OF RECOVERY BOOK
Freq: Once | Status: AC
  Administered 2015-10-03: 01:00:00
  Filled 2015-10-02 (×2): qty 1

## 2015-10-02 MED ORDER — PANTOPRAZOLE SODIUM 40 MG IV SOLR
40.0000 mg | Freq: Every day | INTRAVENOUS | Status: DC
  Administered 2015-10-03: 40 mg via INTRAVENOUS
  Filled 2015-10-02: qty 40

## 2015-10-02 MED ORDER — ALTEPLASE (STROKE) FULL DOSE INFUSION: 0.9000 mg/kg | Freq: Once | INTRAVENOUS | Status: DC

## 2015-10-02 MED ORDER — ALTEPLASE (STROKE) FULL DOSE INFUSION
0.9000 mg/kg | Freq: Once | INTRAVENOUS | Status: AC
  Administered 2015-10-02: 60 mg via INTRAVENOUS
  Filled 2015-10-02: qty 60

## 2015-10-02 MED ORDER — ACETAMINOPHEN 325 MG PO TABS
650.0000 mg | ORAL_TABLET | ORAL | Status: DC | PRN
  Filled 2015-10-02: qty 2

## 2015-10-02 MED ORDER — ACETAMINOPHEN 650 MG RE SUPP: 650.0000 mg | RECTAL | Status: DC | PRN

## 2015-10-02 MED ORDER — SODIUM CHLORIDE 0.9 % IV SOLN
INTRAVENOUS | Status: DC
Start: 2015-10-02 — End: 2015-10-03
  Administered 2015-10-03 (×2): via INTRAVENOUS

## 2015-10-02 NOTE — ED Provider Notes (Signed)
CSN: [id number]     Arrival date & time 10/02/15  2226 History   First MD Initiated Contact with Patient 10/02/15 2234     Chief Complaint  Patient presents with  . Code Stroke     (Consider location/radiation/quality/duration/timing/severity/associated sxs/prior Treatment) Patient is a 59 y.o. female presenting with Acute Neurological Problem. The history is provided by the patient and the EMS personnel.  Cerebrovascular Accident This is a new problem. The current episode started 1 to 2 hours ago. The problem occurs constantly. The problem has been gradually improving. Pertinent negatives include no chest pain, no headaches and no shortness of breath. Nothing aggravates the symptoms. Nothing relieves the symptoms. She has tried nothing for the symptoms. The treatment provided no relief.    59 yo F with a chief complaints of strokelike symptoms. Patient got up to use the bathroom and felt like her left side was weak. When her family came to her side she was unable to move her left side of her body. They called 911 when EMS got there she was having significant improvement of the symptoms but once they got her back on the stretcher she was again having significant weakness to the left side of her body. On arrival to the ED patient feeling mildly better still having left-sided weakness.  Past Medical History  Diagnosis Date  . Hypertension   . Migraines   . Smoker   . Neuromuscular disorder ([place])   . PONV (postoperative nausea and vomiting)   . Tear of medial meniscus of right knee 10/09/2013  . Arthritis    Past Surgical History  Procedure Laterality Date  . Cervical fusion  2007  . Cesarean section  1985  . Abdominal hysterectomy  1997  . Cholecystectomy  1998  . Knee arthroscopy Right 1999  . Colonoscopy    . Knee arthroscopy with medial menisectomy Right 10/09/2013    Procedure: RIGHT KNEE ARTHROSCOPY WITH MEDIAL MENISECTOMY;  Surgeon: Johnny Bridge, MD;  Location: [facility];  Service: Orthopedics;  Laterality: Right;  clean  . Anterior cervical decomp/discectomy fusion N/A 10/25/2014    Procedure: CERVICAL THREE-FOUR, CERVICAL FOUR FIVE ANTERIOR CERVICAL DECOMPRESSION/DISCECTOMY FUSION 2 LEVEL/HARDWARE REMOVALCERVICAL FIVE-SEVEN;  Surgeon: Elaina Hoops, MD;  Location: [place] [facility];  Service: Neurosurgery;  Laterality: N/A;  . Carpal tunnel release Left 10/25/2014    Procedure: CARPAL TUNNEL RELEASE;  Surgeon: Elaina Hoops, MD;  Location: [place] [facility];  Service: Neurosurgery;  Laterality: Left;   No family history on file. Social History  Substance Use Topics  . Smoking status: Current Some Day Smoker    Types: Cigarettes    Last Attempt to Quit: 07/07/2013  . Smokeless tobacco: Never Used  . Alcohol Use: Yes     Comment: rare   OB History    No data available     Review of Systems  Constitutional: Negative for fever and chills.  HENT: Negative for congestion and rhinorrhea.   Eyes: Negative for redness and visual disturbance.  Respiratory: Negative for shortness of breath and wheezing.   Cardiovascular: Negative for chest pain and palpitations.  Gastrointestinal: Negative for nausea and vomiting.  Genitourinary: Negative for dysuria and urgency.  Musculoskeletal: Negative for myalgias and arthralgias.  Skin: Negative for pallor and wound.  Neurological: Positive for facial asymmetry, speech difficulty and weakness. Negative for dizziness and headaches.      Allergies  Floxin and Penicillins  Home Medications   Prior to Admission medications  Medication Sig Start Date End Date Taking? Authorizing Provider  amLODipine (NORVASC) 10 MG tablet TAKE ONE TABLET BY MOUTH EVERY DAY 09/26/15  Yes Alycia Rossetti, MD  cloNIDine (CATAPRES) 0.1 MG tablet TAKE ONE TABLET BY MOUTH TWICE DAILY 09/26/15  Yes Alycia Rossetti, MD  celecoxib (CELEBREX) 100 MG capsule Take 1 capsule (100 mg total) by mouth 2 (two) times daily. Patient not taking:  Reported on 10/02/2015 03/22/15   Alycia Rossetti, MD  chlorzoxazone (PARAFON FORTE DSC) 500 MG tablet Take 0.5 tablets (250 mg total) by mouth 3 (three) times daily. Patient not taking: Reported on 10/02/2015 10/27/14   Kary Kos, MD  HYDROcodone-acetaminophen [facility]) 5-325 MG per tablet Take 1 tablet by mouth every 6 (six) hours as needed for moderate pain. Patient not taking: Reported on 10/02/2015 07/21/14   Alycia Rossetti, MD  oxyCODONE-acetaminophen (ROXICET) [phone] MG/5ML solution Take 5 mLs by mouth every 4 (four) hours as needed for severe pain. Patient not taking: Reported on 10/02/2015 10/27/14   Kary Kos, MD  SUMAtriptan (IMITREX) 100 MG tablet Take 1 tablet (100 mg total) by mouth every 2 (two) hours as needed for migraine. Patient not taking: Reported on 10/02/2015 03/22/15   Alycia Rossetti, MD  valACYclovir (VALTREX) 500 MG tablet Take 1 tablet (500 mg total) by mouth daily. Patient not taking: Reported on 10/02/2015 03/22/15   Alycia Rossetti, MD   BP 138/91 mmHg  Pulse 100  Temp(Src) 98.1 F (36.7 C) (Oral)  Resp 22  Ht 5\' 6"  (1.676 m)  Wt 146 lb 9 oz (66.48 kg)  BMI 23.67 kg/m2  SpO2 96% Physical Exam  Constitutional: She is oriented to person, place, and time. She appears well-developed and well-nourished. No distress.  HENT:  Head: Normocephalic and atraumatic.  Eyes: EOM are normal. Pupils are equal, round, and reactive to light.  Neck: Normal range of motion. Neck supple.  Cardiovascular: Normal rate and regular rhythm.  Exam reveals no gallop and no friction rub.   No murmur heard. Pulmonary/Chest: Effort normal. She has no wheezes. She has no rales.  Abdominal: Soft. She exhibits no distension. There is no tenderness.  Musculoskeletal: She exhibits no edema or tenderness.  Neurological: She is alert and oriented to person, place, and time.  Significant left upper extremity weakness compared to right upper extremity. 3 out of 5. Left-sided facial droop.  Skin:  Skin is warm and dry. She is not diaphoretic.  Psychiatric: She has a normal mood and affect. Her behavior is normal.  Nursing note and vitals reviewed.   ED Course  Procedures (including critical care time) Labs Review Labs Reviewed  CBC - Abnormal; Notable for the following:    RDW 16.0 (*)    All other components within normal limits  DIFFERENTIAL - Abnormal; Notable for the following:    Lymphs Abs 4.9 (*)    All other components within normal limits  COMPREHENSIVE METABOLIC PANEL - Abnormal; Notable for the following:    Glucose, Bld 113 (*)    ALT 9 (*)    All other components within normal limits  I-STAT CHEM 8, ED - Abnormal; Notable for the following:    Glucose, Bld 106 (*)    Calcium, Ion 1.11 (*)    Hemoglobin 16.3 (*)    HCT 48.0 (*)    All other components within normal limits  PROTIME-INR  APTT  HEMOGLOBIN A1C  LIPID PANEL  I-STAT TROPOININ, ED  CBG MONITORING, ED  Imaging Review Ct Head Wo Contrast  10/02/2015  CLINICAL DATA:  59 year old female with code stroke left-sided weakness and facial droop. EXAM: CT HEAD WITHOUT CONTRAST TECHNIQUE: Contiguous axial images were obtained from the base of the skull through the vertex without intravenous contrast. COMPARISON:  None. FINDINGS: The ventricles and sulci are appropriate in size for the patient's age. There is no intracranial hemorrhage. No mass effect or midline shift identified. There is apparent asymmetric prominence of the white matter in the right convexity most likely related to imaging technique and slightly to the positioning of the patient. Underlying edema is less likely. Clinical correlation is recommended. MRI may provide better evaluation if there is high clinical suspicion for acute infarct. Small old right basal ganglia lacunar infarct noted. The gray-white matter differentiation is preserved. There is no extra-axial fluid collection. There is diffuse increased vascular densities likely related to  hemoconcentration. There is slight apparent focal prominence of the basilar tip measuring up to 6 mm (series 2, image 12). An aneurysm is not excluded. CT angiography is recommended for further evaluation. The visualized paranasal sinuses and mastoid air cells are well aerated. The calvarium is intact. IMPRESSION: No acute intracranial hemorrhage. Slight asymmetric white matter prominence of the right centrum semiovale most likely related to patient positioning. Slight focal prominence of the basilar tip. CT angiography is recommended for further evaluation. These results were called by telephone at the time of interpretation on 10/02/2015 at 10:52 pm to Dr. Armida Sans, who verbally acknowledged these results. Electronically Signed   By: Anner Crete M.D.   On: 10/02/2015 22:58   I have personally reviewed and evaluated these images and lab results as part of my medical decision-making.   EKG Interpretation None      MDM   Final diagnoses:  Stroke-like symptoms  Stroke, acute, embolic [facility])    59 yo F with a chief complaint of left-sided weakness and left-sided facial droop. Is occurred about an hour and half ago. Made a code stroke on arrival. Patient protecting her airway and taken urgently to CT.  Neuro concerned for acute stroke in window, will give TPA. Neuro ICU admit.   The patients results and plan were reviewed and discussed.   Any x-rays performed were independently reviewed by myself.   Differential diagnosis were considered with the presenting HPI.  Medications  alteplase (ACTIVASE) 1 mg/mL infusion 60 mg (60 mg Intravenous New Bag/Given 10/02/15 2307)   stroke: mapping our early stages of recovery book (not administered)  0.9 %  sodium chloride infusion (not administered)  acetaminophen (TYLENOL) tablet 650 mg (not administered)    Or  acetaminophen (TYLENOL) suppository 650 mg (not administered)  senna-docusate (Senokot-S) tablet 1 tablet (not administered)  labetalol  (NORMODYNE,TRANDATE) injection 10 mg (not administered)  pantoprazole (PROTONIX) injection 40 mg (not administered)    Filed Vitals:   10/02/15 2254 10/02/15 2258 10/02/15 2300 10/02/15 2315  BP:  130/85 124/85 138/91  Pulse:  101 100   Temp:  98.1 F (36.7 C)    TempSrc:  Oral    Resp:  21 22   Height: 5\' 6"  (1.676 m)     Weight: 146 lb 9 oz (66.48 kg)     SpO2:  95% 96%     Final diagnoses:  Stroke-like symptoms  Stroke, acute, embolic ([place])    Admission/ observation were discussed with the admitting physician, patient and/or family and they are comfortable with the plan.    CRITICAL CARE Performed by: Marijean Heath  Zaccheus Edmister   Total critical care time: 35 minutes  Critical care time was exclusive of separately billable procedures and treating other patients.  Critical care was necessary to treat or prevent imminent or life-threatening deterioration.  Critical care was time spent personally by me on the following activities: development of treatment plan with patient and/or surrogate as well as nursing, discussions with consultants, evaluation of patient's response to treatment, examination of patient, obtaining history from patient or surrogate, ordering and performing treatments and interventions, ordering and review of laboratory studies, ordering and review of radiographic studies, pulse oximetry and re-evaluation of patient's condition.   Deno Etienne, DO 10/02/15 2325

## 2015-10-02 NOTE — Consult Note (Signed)
Referring Physician: ED    Chief Complaint: code stroke, left hemiparesis  HPI:                                                                                                                                         Alicia Bolton is an 59 y.o. female with a past medical history significant for HTN, smoking, migraines, s/p chest mass surgery 7/16, s/p cervical fusion, brought in by EMS as a code stroke due to acute onset of the above stated symptoms. Patient lives at home and said that for unclear reason she lost bladder control while in bed, got up to go to the bathroom and almost fell because the left leg was significantly weak and she had to be help to get up from the floor. Then, she realized that her left arm was also very weak. EMS was summoned and documented the presence of left hemiparesis. Upon arrival to the ED she had initial NIHSS 8 with subsequent improvement mainly in her left arm after returning from the CT suite. CT brain was personally reviewed and showed no acute abnormality. Available labs also reviewed: platelet count 266, INR 0.97, PTT 28 Presently, she denies HA, vertigo, double vision, slurred speech, language or vision impairment.  Date last known well: 10/02/15 Time last known well: 2125 tPA Given: yes NIHSS: 8 MRS: 0  Past Medical History  Diagnosis Date  . Hypertension   . Migraines   . Smoker   . Neuromuscular disorder ([place])   . PONV (postoperative nausea and vomiting)   . Tear of medial meniscus of right knee 10/09/2013  . Arthritis     Past Surgical History  Procedure Laterality Date  . Cervical fusion  2007  . Cesarean section  1985  . Abdominal hysterectomy  1997  . Cholecystectomy  1998  . Knee arthroscopy Right 1999  . Colonoscopy    . Knee arthroscopy with medial menisectomy Right 10/09/2013    Procedure: RIGHT KNEE ARTHROSCOPY WITH MEDIAL MENISECTOMY;  Surgeon: Johnny Bridge, MD;  Location: [place];  Service: Orthopedics;   Laterality: Right;  clean  . Anterior cervical decomp/discectomy fusion N/A 10/25/2014    Procedure: CERVICAL THREE-FOUR, CERVICAL FOUR FIVE ANTERIOR CERVICAL DECOMPRESSION/DISCECTOMY FUSION 2 LEVEL/HARDWARE REMOVALCERVICAL FIVE-SEVEN;  Surgeon: Elaina Hoops, MD;  Location: [place] [facility];  Service: Neurosurgery;  Laterality: N/A;  . Carpal tunnel release Left 10/25/2014    Procedure: CARPAL TUNNEL RELEASE;  Surgeon: Elaina Hoops, MD;  Location: [place] [facility];  Service: Neurosurgery;  Laterality: Left;    No family history on file. Social History:  reports that she has been smoking Cigarettes.  She has never used smokeless tobacco. She reports that she drinks alcohol. She reports that she does not use illicit drugs. Family history: no MS, brain tumor, or epilepsy. Allergies:  Allergies  Allergen Reactions  . Floxin [Ofloxacin]  Makes sick to stomach and vomit  . Penicillins Rash    Medications:                                                                                                                           I have reviewed the patient's current medications. Prior to Admission:  (Not in a hospital admission)  ROS:                                                                                                                                       History obtained from family, chart review and the patient  General ROS: negative for - chills, fatigue, fever, night sweats, weight gain or weight loss Psychological ROS: negative for - behavioral disorder, hallucinations, memory difficulties, mood swings or suicidal ideation Ophthalmic ROS: negative for - blurry vision, double vision, eye pain or loss of vision ENT ROS: negative for - epistaxis, nasal discharge, oral lesions, sore throat, tinnitus or vertigo Allergy and Immunology ROS: negative for - hives or itchy/watery eyes Hematological and Lymphatic ROS: negative for - bleeding problems, bruising or swollen lymph nodes Endocrine  ROS: negative for - galactorrhea, hair pattern changes, polydipsia/polyuria or temperature intolerance Respiratory ROS: negative for - cough, hemoptysis, shortness of breath or wheezing Cardiovascular ROS: negative for - chest pain, dyspnea on exertion, edema or irregular heartbeat Gastrointestinal ROS: negative for - abdominal pain, diarrhea, hematemesis, nausea/vomiting or stool incontinence Genito-Urinary ROS: negative for - dysuria, hematuria, incontinence or urinary frequency/urgency Musculoskeletal ROS: negative for - joint swelling  Neurological ROS: as noted in HPI Dermatological ROS: negative for rash and skin lesion changes   Physical exam:  Constitutional: well developed, pleasant female in no apparent distress. Blood pressure 138/91, pulse 100, temperature 98.1 F (36.7 C), temperature source Oral, resp. rate 22, height 5\' 6"  (1.676 m), weight 66.48 kg (146 lb 9 oz), SpO2 96 %. Eyes: no jaundice or exophthalmos.  Head: normocephalic. Neck: supple, no bruits, no JVD. Cardiac: no murmurs. Lungs: clear. Abdomen: soft, no tender, no mass. Extremities: no edema, clubbing, or cyanosis.  Skin: no rash  Neurologic Examination:  General: NAD Mental Status: Alert, oriented, thought content appropriate.  Speech fluent without evidence of aphasia.  Able to follow 3 step commands without difficulty. Cranial Nerves: II: Discs flat bilaterally; Visual fields grossly normal, pupils equal, round, reactive to light and accommodation III,IV, VI: ptosis not present, extra-ocular motions intact bilaterally V,VII: smile symmetric, facial light touch sensation normal bilaterally VIII: hearing normal bilaterally IX,X: uvula rises symmetrically XI: bilateral shoulder shrug XII: midline tongue extension without atrophy or fasciculations  Motor: Dense left hemiparesis leg>arm Tone and bulk:normal  tone throughout; no atrophy noted Sensory: Pinprick and light touch intact throughout, bilaterally Deep Tendon Reflexes:  Right: Upper Extremity   Left: Upper extremity   biceps (C-5 to C-6) 2/4   biceps (C-5 to C-6) 2/4 tricep (C7) 2/4    triceps (C7) 2/4 Brachioradialis (C6) 2/4  Brachioradialis (C6) 2/4  Lower Extremity Lower Extremity  quadriceps (L-2 to L-4) 2/4   quadriceps (L-2 to L-4) 2/4 Achilles (S1) 2/4   Achilles (S1) 2/4  Plantars: Right: downgoing   Left: downgoing Cerebellar: Impaired left FTN and HKS Gait:  No tested due to multiple leads, significant left leg weakness    Results for orders placed or performed during the hospital encounter of 10/02/15 (from the past 48 hour(s))  I-stat troponin, ED (not at [facility], [facility])     Status: None   Collection Time: 10/02/15 10:33 PM  Result Value Ref Range   Troponin i, poc 0.00 0.00 - 0.08 ng/mL   Comment 3            Comment: Due to the release kinetics of cTnI, a negative result within the first hours of the onset of symptoms does not rule out myocardial infarction with certainty. If myocardial infarction is still suspected, repeat the test at appropriate intervals.   Protime-INR     Status: None   Collection Time: 10/02/15 10:37 PM  Result Value Ref Range   Prothrombin Time 13.1 11.6 - 15.2 seconds   INR 0.97 0.00 - 1.49  APTT     Status: None   Collection Time: 10/02/15 10:37 PM  Result Value Ref Range   aPTT 28 24 - 37 seconds  CBC     Status: Abnormal   Collection Time: 10/02/15 10:37 PM  Result Value Ref Range   WBC 10.4 4.0 - 10.5 K/uL   RBC 4.54 3.87 - 5.11 MIL/uL   Hemoglobin 13.8 12.0 - 15.0 g/dL   HCT 40.3 36.0 - 46.0 %   MCV 88.8 78.0 - 100.0 fL   MCH 30.4 26.0 - 34.0 pg   MCHC 34.2 30.0 - 36.0 g/dL   RDW 16.0 (H) 11.5 - 15.5 %   Platelets 266 150 - 400 K/uL  Differential     Status: Abnormal   Collection Time: 10/02/15 10:37 PM  Result Value Ref Range   Neutrophils Relative % 45 %   Neutro  Abs 4.6 1.7 - 7.7 K/uL   Lymphocytes Relative 46 %   Lymphs Abs 4.9 (H) 0.7 - 4.0 K/uL   Monocytes Relative 7 %   Monocytes Absolute 0.7 0.1 - 1.0 K/uL   Eosinophils Relative 2 %   Eosinophils Absolute 0.2 0.0 - 0.7 K/uL   Basophils Relative 0 %   Basophils Absolute 0.0 0.0 - 0.1 K/uL  I-Stat Chem 8, ED  (not at [facility], [facility])     Status: Abnormal   Collection Time: 10/02/15 11:03 PM  Result Value Ref Range   Sodium 141 135 - 145  mmol/L   Potassium 3.7 3.5 - 5.1 mmol/L   Chloride 105 101 - 111 mmol/L   BUN 17 6 - 20 mg/dL   Creatinine, Ser 0.70 0.44 - 1.00 mg/dL   Glucose, Bld 106 (H) 65 - 99 mg/dL   Calcium, Ion 1.11 (L) 1.12 - 1.23 mmol/L   TCO2 25 0 - 100 mmol/L   Hemoglobin 16.3 (H) 12.0 - 15.0 g/dL   HCT 48.0 (H) 36.0 - 46.0 %   Ct Head Wo Contrast  10/02/2015  CLINICAL DATA:  59 year old female with code stroke left-sided weakness and facial droop. EXAM: CT HEAD WITHOUT CONTRAST TECHNIQUE: Contiguous axial images were obtained from the base of the skull through the vertex without intravenous contrast. COMPARISON:  None. FINDINGS: The ventricles and sulci are appropriate in size for the patient's age. There is no intracranial hemorrhage. No mass effect or midline shift identified. There is apparent asymmetric prominence of the white matter in the right convexity most likely related to imaging technique and slightly to the positioning of the patient. Underlying edema is less likely. Clinical correlation is recommended. MRI may provide better evaluation if there is high clinical suspicion for acute infarct. Small old right basal ganglia lacunar infarct noted. The gray-white matter differentiation is preserved. There is no extra-axial fluid collection. There is diffuse increased vascular densities likely related to hemoconcentration. There is slight apparent focal prominence of the basilar tip measuring up to 6 mm (series 2, image 12). An aneurysm is not excluded. CT angiography is recommended  for further evaluation. The visualized paranasal sinuses and mastoid air cells are well aerated. The calvarium is intact. IMPRESSION: No acute intracranial hemorrhage. Slight asymmetric white matter prominence of the right centrum semiovale most likely related to patient positioning. Slight focal prominence of the basilar tip. CT angiography is recommended for further evaluation. These results were called by telephone at the time of interpretation on 10/02/2015 at 10:52 pm to Dr. Armida Sans, who verbally acknowledged these results. Electronically Signed   By: Anner Crete M.D.   On: 10/02/2015 22:58    Assessment: 59 y.o. female brought with with acute onset dense left hemiparesis arm>leg. NIHSS 8, CT brain without acute abnormality. Likely right brain infarct (prominent left leg involvement, perhaps right ACA distribution). Patient deficits improved after returning from the CT suite but still with a degree of left LE weakness that will certainly impaired her functional neurological motor skills and thus after reviewing the inclusion and exclusion criteria she was deemed a suitable candidate for thrombolysis and I made the decision to administer iv tPA. Admit to NICU and follow post iv tPA protocol. Stroke team will follow up tomorrow.  Stroke Risk Factors -HTN, smoking.  Plan: 1. HgbA1c, fasting lipid panel 2. MRI, MRA  of the brain without contrast 3. Echocardiogram 4. Carotid dopplers 5. Prophylactic therapy-as per protocol 6. Risk factor modification 7. Telemetry monitoring 8. Frequent neuro checks 9. PT/OT SLP   Dorian Pod, MD [facility] [phone]  10/02/2015, 11:20 PM

## 2015-10-02 NOTE — ED Notes (Signed)
Pt got up around 930 to use the bathroom and has episode of incontinence of urine, and then fell because she had left sided weakness. Upon EMS arrival Pt had L sided weakness and facial drop, that had resolved and then returned.

## 2015-10-02 NOTE — ED Notes (Signed)
EDP at bridge to assess airway and pt straight to CT with EMS, RN, and rapid response

## 2015-10-02 NOTE — ED Notes (Signed)
CHECKED CBG 110 RN JESSICA INFORMED

## 2015-10-02 NOTE — Code Documentation (Signed)
Code stoke called for this 59 y/o black female pt who was LSW at 2125 hrs.  Pt was lying in bed watching TV when she tried to get OOB to go to the [place].  She was incontinent of urine and as she tried to get OOB her legs gave way and she slumped to the floor area between her bed and the dresser.  Denies LOC and did not hit her head.  She called to her sister to summon EMS.  Upon arrival of EMS pt was found to have left hemiparesis and left facial droop.  CBG 112  BP 131/80.  Pt arrived [facility] ED at 2226, was cleared by Dr Tyrone Nine for CT at 2226 and arrived CT suite at 2227.  Upon return to ED B15 pt NIHSS scored an 8 with points for motor left arm (3), motor left leg (4) and ataxia (1)the patient subsequently had improvement  with her left UE but LLE remained paretic.Left facial droop seen by EMS had also resolved.  CT head results of no acute intracranial abnormality were called to Dr Armida Sans at 2252.  The decision to administer TPA was made by Dr Armida Sans at 2255 and pharmacy was notified at same time. A second IV was started and foley catheter was inserted per protocol.   TPA was delivered to bedside at 2304, a 6 mg bolus over 1 minute was begun at 2307 followed by administration of 54 mg over 1 hour. Door to needle time was 41 minutes!   Report was called to [facility] and pt was transported to 3M14 at 2330.  Hand off report given to [facility], Therapist, sports.

## 2015-10-03 ENCOUNTER — Inpatient Hospital Stay (HOSPITAL_COMMUNITY): Payer: Medicare Other

## 2015-10-03 DIAGNOSIS — E785 Hyperlipidemia, unspecified: Secondary | ICD-10-CM

## 2015-10-03 DIAGNOSIS — I6789 Other cerebrovascular disease: Secondary | ICD-10-CM

## 2015-10-03 DIAGNOSIS — G451 Carotid artery syndrome (hemispheric): Secondary | ICD-10-CM

## 2015-10-03 DIAGNOSIS — I10 Essential (primary) hypertension: Secondary | ICD-10-CM

## 2015-10-03 LAB — LIPID PANEL
Cholesterol: 241 mg/dL — ABNORMAL HIGH (ref 0–200)
HDL: 43 mg/dL (ref >40)
LDL CALC: 181 mg/dL — AB (ref 0–99)
TRIGLYCERIDES: 84 mg/dL (ref <150)
Total CHOL/HDL Ratio: 5.6 RATIO
VLDL: 17 mg/dL (ref 0–40)

## 2015-10-03 LAB — MRSA PCR SCREENING: MRSA BY PCR: NEGATIVE

## 2015-10-03 MED ORDER — PANTOPRAZOLE SODIUM 40 MG PO TBEC
40.0000 mg | DELAYED_RELEASE_TABLET | Freq: Every day | ORAL | Status: DC
  Administered 2015-10-03 – 2015-10-06 (×4): 40 mg via ORAL
  Filled 2015-10-03 (×4): qty 1

## 2015-10-03 MED ORDER — IOHEXOL 350 MG/ML SOLN
50.0000 mL | Freq: Once | INTRAVENOUS | Status: AC | PRN
  Administered 2015-10-03: 50 mL via INTRAVENOUS

## 2015-10-03 MED ORDER — ATORVASTATIN CALCIUM 40 MG PO TABS
40.0000 mg | ORAL_TABLET | Freq: Every day | ORAL | Status: DC
  Administered 2015-10-03 – 2015-10-05 (×3): 40 mg via ORAL
  Filled 2015-10-03 (×3): qty 1

## 2015-10-03 MED ORDER — ENOXAPARIN SODIUM 40 MG/0.4ML ~~LOC~~ SOLN
40.0000 mg | SUBCUTANEOUS | Status: DC
  Administered 2015-10-03 – 2015-10-05 (×3): 40 mg via SUBCUTANEOUS
  Filled 2015-10-03 (×3): qty 0.4

## 2015-10-03 MED ORDER — ASPIRIN 325 MG PO TABS
325.0000 mg | ORAL_TABLET | Freq: Every day | ORAL | Status: DC
  Administered 2015-10-03 – 2015-10-06 (×4): 325 mg via ORAL
  Filled 2015-10-03 (×4): qty 1

## 2015-10-03 NOTE — Progress Notes (Signed)
PT Cancellation Note  Patient Details Name: Alicia Bolton MRN: [id number] DOB: 1956/09/18   Cancelled Treatment:    Reason Eval/Treat Not Completed: Patient not medically ready.  Pt is on strict bedrest.  Please advance activity order when appropriate for PT and mobility.     Novalynn Branaman, Thornton Papas 10/03/2015, 8:34 AM

## 2015-10-03 NOTE — Progress Notes (Signed)
Echocardiogram 2D Echocardiogram has been performed.  Alicia Bolton 10/03/2015, 11:38 AM

## 2015-10-03 NOTE — Care Management Note (Signed)
Case Management Note  Patient Details  Name: BRILEE TINDEL MRN: [id number] Date of Birth: 03-05-1956  Subjective/Objective:   Pt admitted on 10/02/15 s/p stroke with TPA.  PTA, pt independent and from home.  She has a son in the [place] that she is requesting help contacting about her hospitalization.                 Action/Plan: CSW consulted to assist pt with contacting her son in the [employer].  Will continue to follow for discharge planning as pt progresses.    Expected Discharge Date:                  Expected Discharge Plan:  Home/Self Care  In-House Referral:  Clinical Social Work  Discharge planning Services  CM Consult  Post Acute Care Choice:    Choice offered to:     DME Arranged:    DME Agency:     HH Arranged:    HH Agency:     Status of Service:  In process, will continue to follow  Medicare Important Message Given:    Date Medicare IM Given:    Medicare IM give by:    Date Additional Medicare IM Given:    Additional Medicare Important Message give by:     If discussed at [place] of Stay Meetings, dates discussed:    Additional Comments:  Reinaldo Raddle, RN, BSN  Trauma/Neuro ICU Case Manager [phone]

## 2015-10-03 NOTE — Clinical Social Work Note (Signed)
CSW has contacted the [employer] cross to request emergency notification to reach the patient's son who is currently stationed at [place] in [place]. Tracking number: [id number]. The status of this request can be checked by calling [phone].   Liz Beach MSW, [place], [place], [id number]

## 2015-10-03 NOTE — Progress Notes (Signed)
STROKE TEAM PROGRESS NOTE   HISTORY Alicia Bolton is an 59 y.o. female with a past medical history significant for HTN, smoking, migraines, s/p chest mass surgery 7/16, s/p cervical fusion, brought in by EMS as a code stroke due to acute onset of left hemiparesis. Patient lives at home and said that for unclear reason she lost bladder control while in bed, got up to go to the bathroom and almost fell because the left leg was significantly weak and she had to be help to get up from the floor. Then, she realized that her left arm was also very weak. EMS was summoned and documented the presence of left hemiparesis. Upon arrival to the ED she had initial NIHSS 8 with subsequent improvement mainly in her left arm after returning from the CT suite. CT brain showed no acute abnormality. Available labs also reviewed: platelet count 266, INR 0.97, PTT 28 Presently, she denies HA, vertigo, double vision, slurred speech, language or vision impairment. She was last known well 10/02/15 at 2125. NIHSS: 8. MRS: 0. Patient was administered TPA 10/01/2105 at 2307. She was admitted to the neuro ICU for further evaluation and treatment.   SUBJECTIVE (INTERVAL HISTORY) No family is at the bedside.  Overall she feels her condition is completely resolved. She stated that she has fibromyalgia, migraine HA, HTN, s/p cervical fusion x 2, s/p benign chest mass removal 04/2015, and FHx of stroke. However, she did have stress with family as she lost her lob and currently no insurance and applying for social security. She also has problem with daughter in law that she was not able to see her grandchildren.    OBJECTIVE Temp:  [97.8 F (36.6 C)-98.2 F (36.8 C)] 98.2 F (36.8 C) (12/05 0721) Pulse Rate:  [78-101] 86 (12/05 0800) Cardiac Rhythm:  [-]  Resp:  [15-24] 19 (12/05 0800) BP: (107-138)/(67-91) 116/78 mmHg (12/05 0800) SpO2:  [94 %-100 %] 96 % (12/05 0800) Weight:  [66.48 kg (146 lb 9 oz)-70.308 kg (155 lb)] 66.48 kg  (146 lb 9 oz) (12/04 2254)  CBC:  Recent Labs Lab 10/02/15 2237 10/02/15 2303  WBC 10.4  --   NEUTROABS 4.6  --   HGB 13.8 16.3*  HCT 40.3 48.0*  MCV 88.8  --   PLT 266  --     Basic Metabolic Panel:  Recent Labs Lab 10/02/15 2237 10/02/15 2303  NA 139 141  K 3.9 3.7  CL 107 105  CO2 22  --   GLUCOSE 113* 106*  BUN 14 17  CREATININE 0.76 0.70  CALCIUM 9.2  --     Lipid Panel:    Component Value Date/Time   CHOL 241* 10/03/2015 0400   TRIG 84 10/03/2015 0400   HDL 43 10/03/2015 0400   CHOLHDL 5.6 10/03/2015 0400   VLDL 17 10/03/2015 0400   LDLCALC 181* 10/03/2015 0400   HgbA1c: No results found for: HGBA1C Urine Drug Screen: No results found for: LABOPIA, COCAINSCRNUR, LABBENZ, AMPHETMU, THCU, LABBARB    IMAGING I have personally reviewed the radiological images below and agree with the radiology interpretations.  Ct Head Wo Contrast 10/02/2015  No acute intracranial hemorrhage. Slight asymmetric white matter prominence of the right centrum semiovale most likely related to patient positioning. Slight focal prominence of the basilar tip. CT angiography is recommended for further evaluation.   CT HEAD 10/03/2015    No abnormal intracranial enhancement.   CTA NECK 10/03/2015   No hemodynamically significant stenosis or acute vascular process.  CTA HEAD 10/03/2015   No acute large vessel occlusion or high-grade stenosis. Dolicoectatic intracranial vessels most commonly seen with chronic hypertension, less likely HIV.   MRI pending  2D echo pending   PHYSICAL EXAM Physical exam  Temp:  [97.8 F (36.6 C)-98.2 F (36.8 C)] 98 F (36.7 C) (12/05 1110) Pulse Rate:  [78-101] 87 (12/05 1500) Resp:  [14-25] 16 (12/05 1500) BP: (107-139)/(67-99) 135/97 mmHg (12/05 1500) SpO2:  [94 %-100 %] 98 % (12/05 1500) Weight:  [146 lb 9 oz (66.48 kg)-155 lb (70.308 kg)] 146 lb 9 oz (66.48 kg) (12/04 2254)  General - Well nourished, well developed, in no apparent  distress.  Ophthalmologic - Sharp disc margins OU.   Cardiovascular - Regular rate and rhythm with no murmur.  Mental Status -  Level of arousal and orientation to time, place, and person were intact. Language including expression, naming, repetition, comprehension was assessed and found intact. Fund of Knowledge was assessed and was intact.  Cranial Nerves II - XII - II - Visual field intact OU. III, IV, VI - Extraocular movements intact. V - Facial sensation intact bilaterally. VII - Facial movement intact bilaterally. VIII - Hearing & vestibular intact bilaterally. X - Palate elevates symmetrically. XI - Chin turning & shoulder shrug intact bilaterally. XII - Tongue protrusion intact.  Motor Strength - The patient's strength was normal in all extremities and pronator drift was absent.  Bulk was normal and fasciculations were absent.   Motor Tone - Muscle tone was assessed at the neck and appendages and was normal.  Reflexes - The patient's reflexes were 1+ in all extremities and she had no pathological reflexes.  Sensory - Light touch, temperature/pinprick were assessed and were symmetrical.    Coordination - The patient had normal movements in the hands and feet with no ataxia or dysmetria.  Tremor was absent.  Gait and Station - not tested due to safety concerns.   ASSESSMENT/PLAN Ms. Alicia Bolton is a 59 y.o. female with history of HTN, smoking, migraines, s/p chest mass surgery 7/16, s/p cervical fusion presenting with left hemiparesis. Patient was administered TPA 10/01/2105 at 2307.   TIA vs. Aborted stroke vs. Conversion disorder  Resultant  resolved  CTA head and neck No significant large vessel stenosis  MRI  pending  2D Echo  pending  LDL 181  HgbA1c pending  SCDs for VTE prophylaxis  Diet NPO time specified  No antithrombotic prior to admission, now on No antithrombotic as within 24h of tPA  Ongoing aggressive stroke risk factor  management  Therapy recommendations:  Pending   Disposition:  pending  Hypertension  Stable Permissive hypertension (OK if < 180/105) but gradually normalize in 5-7 days  Hyperlipidemia  Home meds:  No statin resumed in hospital  LDL 181, goal < 70  Add lipitor 40mg    Continue statin at discharge  Other Stroke Risk Factors  Cigarette smoker, advised to stop smoking  ETOH use  Migraines  Other Active Problems    Hospital day # 1  This patient is critically ill due to tPA infusion and risk of stroke and at significant risk of neurological worsening, death form hemorrhage. This patient's care requires constant monitoring of vital signs, hemodynamics, respiratory and cardiac monitoring, review of multiple databases, neurological assessment, discussion with family, other specialists and medical decision making of high complexity. I spent 35 minutes of neurocritical care time in the care of this patient.  Rosalin Hawking, MD PhD Stroke Neurology 10/03/2015 3:22 PM  To contact Stroke Continuity provider, please refer to [email or web address] After hours, contact General Neurology

## 2015-10-04 DIAGNOSIS — R002 Palpitations: Secondary | ICD-10-CM

## 2015-10-04 DIAGNOSIS — I63421 Cerebral infarction due to embolism of right anterior cerebral artery: Secondary | ICD-10-CM

## 2015-10-04 LAB — HEMOGLOBIN A1C
Hgb A1c MFr Bld: 5.9 % — ABNORMAL HIGH (ref 4.8–5.6)
Mean Plasma Glucose: 123 mg/dL

## 2015-10-04 MED ORDER — CLONIDINE HCL 0.1 MG PO TABS
0.1000 mg | ORAL_TABLET | Freq: Two times a day (BID) | ORAL | Status: DC
  Administered 2015-10-04 – 2015-10-06 (×5): 0.1 mg via ORAL
  Filled 2015-10-04 (×5): qty 1

## 2015-10-04 MED ORDER — SODIUM CHLORIDE 0.9 % IV SOLN
INTRAVENOUS | Status: DC
Start: 2015-10-04 — End: 2015-10-05
  Administered 2015-10-05: 20 mL/h via INTRAVENOUS
  Administered 2015-10-05: 500 mL via INTRAVENOUS

## 2015-10-04 NOTE — Evaluation (Signed)
Physical Therapy Evaluation Patient Details Name: Alicia Bolton MRN: [id number] DOB: 25-Apr-1956 Today's Date: 10/04/2015   History of Present Illness  pt presents with Patchy Small R Parietal Infarcts.  pt with hx of Fibromyalgia, Migraines, HTN, Cervical Fusion, and Benign Chest Mass Removed.    Clinical Impression  Pt very eager for mobility, but upon standing pt's HR increased to 140's and peaked at 150 nonsustained.  RN aware of HR, which once sitting returned to 100 - 110's.  Feel pt will make great progress and have no PT needs at D/C.  Will continue to follow while on acute.      Follow Up Recommendations No PT follow up;Supervision - Intermittent    Equipment Recommendations  None recommended by PT    Recommendations for Other Services       Precautions / Restrictions Precautions Precautions: Fall Precaution Comments: watch HR Restrictions Weight Bearing Restrictions: No      Mobility  Bed Mobility Overal bed mobility: Modified Independent                Transfers Overall transfer level: Needs assistance Equipment used: None Transfers: Sit to/from Stand Sit to Stand: Min guard         General transfer comment: Definite use of UEs and uses rocking to come to stand.  No physical A needed.    Ambulation/Gait Ambulation/Gait assistance: Min guard Ambulation Distance (Feet): 20 Feet Assistive device: None Gait Pattern/deviations: Step-through pattern;Decreased stride length     General Gait Details: pt moves very cautiously and indicates she is nervous since the L LE wasn't strong yesterday.  pt's HR tachy up to 150 at highest.  RN aware.    Stairs            Wheelchair Mobility    Modified Rankin (Stroke Patients Only) Modified Rankin (Stroke Patients Only) Pre-Morbid Rankin Score: No significant disability Modified Rankin: Moderately severe disability     Balance Overall balance assessment: Needs assistance Sitting-balance support: No  upper extremity supported;Feet supported Sitting balance-Leahy Scale: Good     Standing balance support: No upper extremity supported;During functional activity Standing balance-Leahy Scale: Fair                               Pertinent Vitals/Pain Pain Assessment: No/denies pain    Home Living Family/patient expects to be discharged to:: Private residence Living Arrangements: Other relatives (sister, adult nephew) Available Help at Discharge: Family;Available 24 hours/day Type of Home: House Home Access: Stairs to enter Entrance Stairs-Rails: Psychiatric nurse of Steps: 5 Home Layout: One level Home Equipment: Cane - single point      Prior Function Level of Independence: Independent               Hand Dominance   Dominant Hand: Right    Extremity/Trunk Assessment   Upper Extremity Assessment: Defer to OT evaluation           Lower Extremity Assessment: Overall WFL for tasks assessed      Cervical / Trunk Assessment: Normal  Communication   Communication: No difficulties  Cognition Arousal/Alertness: Awake/alert Behavior During Therapy: WFL for tasks assessed/performed Overall Cognitive Status: Within Functional Limits for tasks assessed                      General Comments      Exercises        Assessment/Plan    PT  Assessment Patient needs continued PT services  PT Diagnosis Difficulty walking   PT Problem List Decreased activity tolerance;Decreased balance;Decreased mobility;Decreased coordination;Decreased knowledge of use of DME;Cardiopulmonary status limiting activity  PT Treatment Interventions DME instruction;Gait training;Stair training;Functional mobility training;Therapeutic activities;Therapeutic exercise;Balance training;Neuromuscular re-education;Patient/family education   PT Goals (Current goals can be found in the Care Plan section) Acute Rehab PT Goals Patient Stated Goal: Home PT Goal  Formulation: With patient Time For Goal Achievement: 10/11/15 Potential to Achieve Goals: Good    Frequency Min 4X/week   Barriers to discharge        Co-evaluation PT/OT/SLP Co-Evaluation/Treatment: Yes Reason for Co-Treatment: Complexity of the patient's impairments (multi-system involvement) PT goals addressed during session: Mobility/safety with mobility;Balance OT goals addressed during session: ADL's and self-care       End of Session Equipment Utilized During Treatment: Gait belt Activity Tolerance: Treatment limited secondary to medical complications (Comment) (Limited by Tachycardia) Patient left: in chair;with call bell/phone within reach Nurse Communication: Mobility status         Time: [id number] PT Time Calculation (min) (ACUTE ONLY): 21 min   Charges:   PT Evaluation $Initial PT Evaluation Tier I: 1 Procedure     PT G CodesCatarina Hartshorn, Villanueva 10/04/2015, 9:22 AM

## 2015-10-04 NOTE — Progress Notes (Addendum)
STROKE TEAM PROGRESS NOTE   HISTORY Alicia Bolton is an 59 y.o. female with a past medical history significant for HTN, smoking, migraines, s/p chest mass surgery 7/16, s/p cervical fusion, brought in by EMS as a code stroke due to acute onset of left hemiparesis. Patient lives at home and said that for unclear reason she lost bladder control while in bed, got up to go to the bathroom and almost fell because the left leg was significantly weak and she had to be help to get up from the floor. Then, she realized that her left arm was also very weak. EMS was summoned and documented the presence of left hemiparesis. Upon arrival to the ED she had initial NIHSS 8 with subsequent improvement mainly in her left arm after returning from the CT suite. CT brain showed no acute abnormality. Available labs also reviewed: platelet count 266, INR 0.97, PTT 28 Presently, she denies HA, vertigo, double vision, slurred speech, language or vision impairment. She was last known well 10/02/15 at 2125. NIHSS: 8. MRS: 0. Patient was administered TPA 10/01/2105 at 2307. She was admitted to the neuro ICU for further evaluation and treatment.   SUBJECTIVE (INTERVAL HISTORY) Family at the bedside. Patient feels she is a "miracle". RN reports increased HR this am with activity, up to 140. Pt reports a hx of tachycardia at home. She states she feels it in her chest. She was on clonidine for HTN at home and was on hold after admission, but she stated that she has fast HR with activity since the chest surgery.    OBJECTIVE Temp:  [97.9 F (36.6 C)-98.5 F (36.9 C)] 98.5 F (36.9 C) (12/06 0725) Pulse Rate:  [65-102] 91 (12/06 0600) Cardiac Rhythm:  [-] Normal sinus rhythm (12/06 0600) Resp:  [14-25] 17 (12/06 0500) BP: (99-139)/(73-99) 115/79 mmHg (12/06 0600) SpO2:  [97 %-100 %] 99 % (12/06 0600)  CBC:   Recent Labs Lab 10/02/15 2237 10/02/15 2303  WBC 10.4  --   NEUTROABS 4.6  --   HGB 13.8 16.3*  HCT 40.3 48.0*   MCV 88.8  --   PLT 266  --     Basic Metabolic Panel:   Recent Labs Lab 10/02/15 2237 10/02/15 2303  NA 139 141  K 3.9 3.7  CL 107 105  CO2 22  --   GLUCOSE 113* 106*  BUN 14 17  CREATININE 0.76 0.70  CALCIUM 9.2  --     Lipid Panel:     Component Value Date/Time   CHOL 241* 10/03/2015 0400   TRIG 84 10/03/2015 0400   HDL 43 10/03/2015 0400   CHOLHDL 5.6 10/03/2015 0400   VLDL 17 10/03/2015 0400   LDLCALC 181* 10/03/2015 0400   HgbA1c:  Lab Results  Component Value Date   HGBA1C 5.9* 10/03/2015   Urine Drug Screen: No results found for: LABOPIA, COCAINSCRNUR, LABBENZ, AMPHETMU, THCU, LABBARB    IMAGING  Ct Head Wo Contrast 10/02/2015  No acute intracranial hemorrhage. Slight asymmetric white matter prominence of the right centrum semiovale most likely related to patient positioning. Slight focal prominence of the basilar tip. CT angiography is recommended for further evaluation.   CT HEAD 10/03/2015    No abnormal intracranial enhancement.   CTA NECK 10/03/2015   No hemodynamically significant stenosis or acute vascular process.   CTA HEAD 10/03/2015   No acute large vessel occlusion or high-grade stenosis. Dolicoectatic intracranial vessels most commonly seen with chronic hypertension, less likely HIV.  MRI  10/04/2015 1. Patchy small volume acute ischemic cortical infarcts involving the right parietal lobe. No associated hemorrhage or mass effect. 2. Remote lacunar infarcts involving the right basal ganglia/corona radiata. 3. Mild age-related cerebral atrophy with chronic small vessel ischemic disease.  2D echo  - Left ventricle: The cavity size was normal. Wall thickness wasnormal. The estimated ejection fraction was 55%. Wall motion wasnormal; there were no regional wall motion abnormalities. Dopplerparameters are consistent with abnormal left ventricularrelaxation (grade 1 diastolic dysfunction). - Aortic valve: There was no stenosis. - Mitral valve:  There appeared to be a loose chord attached to themitral valve but there was no significant mitral regurgitation. - Right ventricle: The cavity size was normal. Systolic functionwas normal. - Tricuspid valve: Peak RV-RA gradient (S): 21 mm Hg. - Pulmonary arteries: PA peak pressure: 24 mm Hg (S). - Inferior vena cava: The vessel was normal in size. Therespirophasic diameter changes were in the normal range (>= 50%),consistent with normal central venous pressure. - Pericardium, extracardiac: There was no pericardial effusion. Impressions: Normal LV size with EF 55%. Normal RV size and systolic function.No significant valvular abnormalities.  LE venous doppler pending  TEE pending   PHYSICAL EXAM General - Well nourished, well developed, in no apparent distress.  Ophthalmologic - Sharp disc margins OU.   Cardiovascular - Regular rate and rhythm with no murmur.  Mental Status -  Level of arousal and orientation to time, place, and person were intact. Language including expression, naming, repetition, comprehension was assessed and found intact. Fund of Knowledge was assessed and was intact.  Cranial Nerves II - XII - II - Visual field intact OU. III, IV, VI - Extraocular movements intact. V - Facial sensation intact bilaterally. VII - Facial movement intact bilaterally. VIII - Hearing & vestibular intact bilaterally. X - Palate elevates symmetrically. XI - Chin turning & shoulder shrug intact bilaterally. XII - Tongue protrusion intact.  Motor Strength - The patient's strength was normal in all extremities and pronator drift was absent.  Bulk was normal and fasciculations were absent.   Motor Tone - Muscle tone was assessed at the neck and appendages and was normal.  Reflexes - The patient's reflexes were 1+ in all extremities and she had no pathological reflexes.  Sensory - Light touch, temperature/pinprick were assessed and were symmetrical.    Coordination - The patient  had normal movements in the hands and feet with no ataxia or dysmetria.  Tremor was absent.  Gait and Station - not tested due to safety concerns.   ASSESSMENT/PLAN Ms. Alicia Bolton is a 59 y.o. female with history of HTN, smoking, migraines, s/p chest mass surgery 7/16, s/p cervical fusion presenting with left hemiparesis. Patient was administered TPA 10/01/2105 at 2307.   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 Arranged with [facility] for tomorrow.(I have made patient NPO after midnight tonight).  Check bilateral lower extremity venous dopplers to rule out DVT as possible source of stroke.  If TEE negative, a [facility] electrophysiologist will consult and consider placement of an implantable loop recorder to evaluate for atrial fibrillation as etiology of stroke. This has been explained to patient/family by Dr. Erlinda Hong and  they are agreeable.  Given her young age, will check hypercoagulable tests once TEE resulted.   LDL 181  HgbA1c 5.9  Lovenox 40 mg sq daily  for VTE prophylaxis Diet Heart Room service appropriate?: Yes; Fluid consistency:: Thin  No antithrombotic prior to admission, started on aspirin 325 mg daily.   Ongoing aggressive stroke risk factor management  Therapy recommendations:  No therapy needs  Transfer to the floor  Disposition:  Return home once workup completed  Palpitation  Pt complains palpitation since her chest sugery  Resume home clonidine  TEE and potential loop recorder  Hypertension  Stable Permissive hypertension (OK if < 180/105) but gradually normalize in 5-7 days  Hyperlipidemia  Home meds:  No statin   LDL 181, goal <  70  Added lipitor 40mg    Continue statin at discharge  Tobacco abuse  Current smoker  Smoking cessation counseling provided  Pt is willing to quit  Other Stroke Risk Factors  ETOH use  Migraines  Hospital day # 2  Rosalin Hawking, MD PhD Stroke Neurology 10/04/2015 4:39 PM   To contact Stroke Continuity provider, please refer to [email or web address] After hours, contact General Neurology

## 2015-10-04 NOTE — Progress Notes (Signed)
    [facility] has been requested to perform a transesophageal echocardiogram on Alicia Bolton for stroke workup.  After careful review of history and examination, the risks and benefits of transesophageal echocardiogram have been explained including risks of esophageal damage, perforation (1:10,000 risk), bleeding, pharyngeal hematoma as well as other potential complications associated with conscious sedation including aspiration, arrhythmia, respiratory failure and death. Alternatives to treatment were discussed, questions were answered. Patient is willing to proceed.   Tarri Fuller, [place] 10/04/2015 1:55 PM

## 2015-10-04 NOTE — Evaluation (Signed)
Speech Language Pathology Evaluation Patient Details Name: Alicia Bolton MRN: [id number] DOB: [birth year] Today's Date: 10/04/2015 Time: [id number] SLP Time Calculation (min) (ACUTE ONLY): 12 min  Problem List:  Patient Active Problem List   Diagnosis Date Noted  . Stroke with cerebral ischemia ([place]) 10/02/2015  . Neck mass 04/17/2015  . Chest mass 04/17/2015  . Spinal stenosis of cervical region 10/25/2014  . Elevated liver function tests 10/04/2014  . Polyarthralgia 09/20/2014  . Hidradenitis suppurativa 07/21/2014  . Tear of medial meniscus of right knee 10/09/2013  . Carpal tunnel syndrome 07/17/2013  . GENITAL HERPES 06/27/2007  . DISORDER, TOBACCO USE 06/27/2007  . HEADACHE 06/27/2007   Past Medical History:  Past Medical History  Diagnosis Date  . Hypertension   . Migraines   . Smoker   . Neuromuscular disorder ([place])   . PONV (postoperative nausea and vomiting)   . Tear of medial meniscus of right knee 10/09/2013  . Arthritis    Past Surgical History:  Past Surgical History  Procedure Laterality Date  . Cervical fusion  2007  . Cesarean section  1985  . Abdominal hysterectomy  1997  . Cholecystectomy  1998  . Knee arthroscopy Right 1999  . Colonoscopy    . Knee arthroscopy with medial menisectomy Right 10/09/2013    Procedure: RIGHT KNEE ARTHROSCOPY WITH MEDIAL MENISECTOMY;  Surgeon: Johnny Bridge, MD;  Location: [facility];  Service: Orthopedics;  Laterality: Right;  clean  . Anterior cervical decomp/discectomy fusion N/A 10/25/2014    Procedure: CERVICAL THREE-FOUR, CERVICAL FOUR FIVE ANTERIOR CERVICAL DECOMPRESSION/DISCECTOMY FUSION 2 LEVEL/HARDWARE REMOVALCERVICAL FIVE-SEVEN;  Surgeon: Elaina Hoops, MD;  Location: [facility];  Service: Neurosurgery;  Laterality: N/A;  . Carpal tunnel release Left 10/25/2014    Procedure: CARPAL TUNNEL RELEASE;  Surgeon: Elaina Hoops, MD;  Location: [facility];  Service: Neurosurgery;  Laterality: Left;    HPI:  pt presents with Patchy Small R Parietal Infarcts. pt with hx of Fibromyalgia, Migraines, HTN, Cervical Fusion, and Benign Chest Mass Removed.    Assessment / Plan / Recommendation Clinical Impression  Cognitive-linguistic evaluation complete. Cognitive-linguistic function WFL. No further SLP needs indicated.     SLP Assessment  Patient does not need any further Speech Lanaguage Pathology Services    Follow Up Recommendations  None          SLP Evaluation Prior Functioning  Cognitive/Linguistic Baseline: Within functional limits Type of Home: House Available Help at Discharge: Family;Available 24 hours/day   Cognition  Overall Cognitive Status: Within Functional Limits for tasks assessed Orientation Level: Oriented X4    Comprehension  Auditory Comprehension Overall Auditory Comprehension: Appears within functional limits for tasks assessed Visual Recognition/Discrimination Discrimination: Within Function Limits Reading Comprehension Reading Status: Within funtional limits    Expression Expression Primary Mode of Expression: Verbal Verbal Expression Overall Verbal Expression: Appears within functional limits for tasks assessed Written Expression Dominant Hand: Right   Oral / Motor Oral Motor/Sensory Function Overall Oral Motor/Sensory Function: Within functional limits Motor Speech Overall Motor Speech: Appears within functional limits for tasks assessed   Gabriel Rainwater MA, CCC-SLP [phone]  Thornton Dohrmann Meryl 10/04/2015, 11:20 AM

## 2015-10-04 NOTE — Evaluation (Signed)
Occupational Therapy Evaluation Patient Details Name: Alicia Bolton MRN: [id number] DOB: [birth year] Today's Date: 10/04/2015    History of Present Illness pt presents with Patchy Small R Parietal Infarcts.  pt with hx of Fibromyalgia, Migraines, HTN, Cervical Fusion, and Benign Chest Mass Removed.     Clinical Impression   Pt was independent prior to admission.  Presents with decreased activity tolerance due to elevated HR 140s-150 non sustained. Pt requires min guard assist for standing ADL and ADL transfers. Highly motivated to return to independence.  Will follow.  Do not anticipate pt will need follow up therapy.    Follow Up Recommendations  No OT follow up    Equipment Recommendations  None recommended by OT    Recommendations for Other Services       Precautions / Restrictions Precautions Precautions: Fall Precaution Comments: watch HR Restrictions Weight Bearing Restrictions: No      Mobility Bed Mobility Overal bed mobility: Modified Independent                Transfers Overall transfer level: Needs assistance Equipment used: None Transfers: Sit to/from Stand Sit to Stand: Min guard         General transfer comment: Definite use of UEs and uses rocking to come to stand.  No physical A needed.      Balance Overall balance assessment: Needs assistance Sitting-balance support: No upper extremity supported;Feet supported Sitting balance-Leahy Scale: Good     Standing balance support: No upper extremity supported;During functional activity Standing balance-Leahy Scale: Fair                              ADL Overall ADL's : Needs assistance/impaired Eating/Feeding: Independent;Sitting   Grooming: Min guard;Standing   Upper Body Bathing: Set up;Sitting   Lower Body Bathing: Min guard;Sit to/from stand   Upper Body Dressing : Set up;Sitting   Lower Body Dressing: Min guard;Sit to/from stand   Toilet Transfer: Min  guard;Ambulation;Regular Museum/gallery exhibitions officer and Hygiene: Min guard;Sit to/from stand       Functional mobility during ADLs: Min guard       Vision     Perception     Praxis      Pertinent Vitals/Pain Pain Assessment: No/denies pain     Hand Dominance Right   Extremity/Trunk Assessment Upper Extremity Assessment Upper Extremity Assessment: Overall WFL for tasks assessed   Lower Extremity Assessment Lower Extremity Assessment: Overall WFL for tasks assessed   Cervical / Trunk Assessment Cervical / Trunk Assessment: Normal   Communication Communication Communication: No difficulties   Cognition Arousal/Alertness: Awake/alert Behavior During Therapy: WFL for tasks assessed/performed Overall Cognitive Status: Within Functional Limits for tasks assessed                     General Comments       Exercises       Shoulder Instructions      Home Living Family/patient expects to be discharged to:: Private residence Living Arrangements: Other relatives (sister, adult nephew) Available Help at Discharge: Family;Available 24 hours/day Type of Home: House Home Access: Stairs to enter [employer] of Steps: 5 Entrance Stairs-Rails: Right;Left Home Layout: One level     Bathroom Shower/Tub: Teacher, early years/pre: Standard     Home Equipment: Cane - single point          Prior Functioning/Environment Level of Independence: Independent  OT Diagnosis: Generalized weakness   OT Problem List: Decreased activity tolerance;Impaired balance (sitting and/or standing);Cardiopulmonary status limiting activity   OT Treatment/Interventions: Self-care/ADL training;Patient/family education    OT Goals(Current goals can be found in the care plan section) Acute Rehab OT Goals Patient Stated Goal: Home OT Goal Formulation: With patient Time For Goal Achievement: 10/11/15 Potential to Achieve Goals:  Good ADL Goals Pt Will Perform Grooming: Independently;standing Pt Will Transfer to Toilet: Independently;ambulating;regular height toilet Pt Will Perform Toileting - Clothing Manipulation and hygiene: Independently;sit to/from stand Pt Will Perform Tub/Shower Transfer: Tub transfer;Independently;ambulating  OT Frequency: Min 2X/week   Barriers to D/C:            Co-evaluation PT/OT/SLP Co-Evaluation/Treatment: Yes Reason for Co-Treatment: Complexity of the patient's impairments (multi-system involvement) PT goals addressed during session: Mobility/safety with mobility;Balance OT goals addressed during session: ADL's and self-care      End of Session Equipment Utilized During Treatment: Gait belt Nurse Communication: Mobility status (elevated HR)  Activity Tolerance: Treatment limited secondary to medical complications (Comment) (HR elevated to 150 non sustained) Patient left: in chair;with call bell/phone within reach   Time: [id number] OT Time Calculation (min): 20 min Charges:  OT General Charges $OT Visit: 1 Procedure OT Evaluation $Initial OT Evaluation Tier I: 1 Procedure G-Codes:    Malka So 10/04/2015, 9:56 AM  [phone]

## 2015-10-04 NOTE — Progress Notes (Signed)
Pt transferred to 5M10. Pt received by [facility]

## 2015-10-05 ENCOUNTER — Other Ambulatory Visit: Payer: Self-pay | Admitting: Physician Assistant

## 2015-10-05 ENCOUNTER — Encounter (HOSPITAL_COMMUNITY)
Admission: EM | Disposition: A | Discharge status: Home Health | Payer: Self-pay | Source: Home / Self Care | Attending: Neurology

## 2015-10-05 ENCOUNTER — Encounter (HOSPITAL_COMMUNITY): Payer: Self-pay | Admitting: *Deleted

## 2015-10-05 ENCOUNTER — Inpatient Hospital Stay (HOSPITAL_COMMUNITY): Payer: Medicare Other

## 2015-10-05 ENCOUNTER — Inpatient Hospital Stay: Payer: 59 | Admitting: Family Medicine

## 2015-10-05 DIAGNOSIS — F17200 Nicotine dependence, unspecified, uncomplicated: Secondary | ICD-10-CM

## 2015-10-05 DIAGNOSIS — I639 Cerebral infarction, unspecified: Secondary | ICD-10-CM

## 2015-10-05 DIAGNOSIS — I340 Nonrheumatic mitral (valve) insufficiency: Secondary | ICD-10-CM

## 2015-10-05 HISTORY — PX: TEE WITHOUT CARDIOVERSION: SHX5443

## 2015-10-05 LAB — SEDIMENTATION RATE: Sed Rate: 28 mm/hr — ABNORMAL HIGH (ref 0–22)

## 2015-10-05 LAB — ANTITHROMBIN III: ANTITHROMB III FUNC: 111 % (ref 75–120)

## 2015-10-05 SURGERY — ECHOCARDIOGRAM, TRANSESOPHAGEAL
Anesthesia: Moderate Sedation

## 2015-10-05 MED ORDER — MIDAZOLAM HCL 5 MG/ML IJ SOLN
INTRAMUSCULAR | Status: AC
  Filled 2015-10-05: qty 2

## 2015-10-05 MED ORDER — FENTANYL CITRATE (PF) 100 MCG/2ML IJ SOLN
INTRAMUSCULAR | Status: DC | PRN
  Administered 2015-10-05: 50 ug via INTRAVENOUS
  Administered 2015-10-05: 25 ug via INTRAVENOUS

## 2015-10-05 MED ORDER — BUTAMBEN-TETRACAINE-BENZOCAINE 2-2-14 % EX AERO
INHALATION_SPRAY | CUTANEOUS | Status: DC | PRN
  Administered 2015-10-05: 2 via TOPICAL

## 2015-10-05 MED ORDER — MIDAZOLAM HCL 10 MG/2ML IJ SOLN
INTRAMUSCULAR | Status: DC | PRN
  Administered 2015-10-05 (×2): 2 mg via INTRAVENOUS

## 2015-10-05 MED ORDER — FENTANYL CITRATE (PF) 100 MCG/2ML IJ SOLN
INTRAMUSCULAR | Status: AC
  Filled 2015-10-05: qty 2

## 2015-10-05 NOTE — Progress Notes (Signed)
OT Cancellation Note  Patient Details Name: MILIA MEDWID MRN: [id number] DOB: Dec 12, 1955   Cancelled Treatment:    Reason Eval/Treat Not Completed: Patient at procedure or test/ unavailable (Echo). Will attempt to see later if able.  Redmond Baseman, OTR/L 10/05/2015, 8:51 AM

## 2015-10-05 NOTE — Progress Notes (Signed)
STROKE TEAM PROGRESS NOTE   HISTORY Alicia Bolton is an 59 y.o. female with a past medical history significant for HTN, smoking, migraines, s/p chest mass surgery 7/16, s/p cervical fusion, brought in by EMS as a code stroke due to acute onset of left hemiparesis. Patient lives at home and said that for unclear reason she lost bladder control while in bed, got up to go to the bathroom and almost fell because the left leg was significantly weak and she had to be help to get up from the floor. Then, she realized that her left arm was also very weak. EMS was summoned and documented the presence of left hemiparesis. Upon arrival to the ED she had initial NIHSS 8 with subsequent improvement mainly in her left arm after returning from the CT suite. CT brain showed no acute abnormality. Available labs also reviewed: platelet count 266, INR 0.97, PTT 28 Presently, she denies HA, vertigo, double vision, slurred speech, language or vision impairment. She was last known well 10/02/15 at 2125. NIHSS: 8. MRS: 0. Patient was administered TPA 10/01/2105 at 2307. She was admitted to the neuro ICU for further evaluation and treatment.   SUBJECTIVE (INTERVAL HISTORY) Family at the bedside. Patient feels she is a "miracle". RN reports increased HR this am with activity, up to 140. Pt reports a hx of tachycardia at home. She states she feels it in her chest. She was on clonidine for HTN at home and was on hold after admission, but she stated that she has fast HR with activity since the chest surgery.    OBJECTIVE Temp:  [97.7 F (36.5 C)-99 F (37.2 C)] 98.1 F (36.7 C) (12/07 2108) Pulse Rate:  [81-141] 89 (12/07 2108) Cardiac Rhythm:  [-] Normal sinus rhythm (12/07 2031) Resp:  [14-24] 20 (12/07 2108) BP: (105-173)/(75-100) 124/76 mmHg (12/07 2108) SpO2:  [93 %-99 %] 99 % (12/07 2108) Weight:  [142 lb (64.411 kg)] 142 lb (64.411 kg) (12/07 0819)  CBC:   Recent Labs Lab 10/02/15 2237 10/02/15 2303  WBC  10.4  --   NEUTROABS 4.6  --   HGB 13.8 16.3*  HCT 40.3 48.0*  MCV 88.8  --   PLT 266  --     Basic Metabolic Panel:   Recent Labs Lab 10/02/15 2237 10/02/15 2303  NA 139 141  K 3.9 3.7  CL 107 105  CO2 22  --   GLUCOSE 113* 106*  BUN 14 17  CREATININE 0.76 0.70  CALCIUM 9.2  --     Lipid Panel:     Component Value Date/Time   CHOL 241* 10/03/2015 0400   TRIG 84 10/03/2015 0400   HDL 43 10/03/2015 0400   CHOLHDL 5.6 10/03/2015 0400   VLDL 17 10/03/2015 0400   LDLCALC 181* 10/03/2015 0400   HgbA1c:  Lab Results  Component Value Date   HGBA1C 5.9* 10/03/2015   Urine Drug Screen: No results found for: LABOPIA, COCAINSCRNUR, LABBENZ, AMPHETMU, THCU, LABBARB    IMAGING  Ct Head Wo Contrast 10/02/2015  No acute intracranial hemorrhage. Slight asymmetric white matter prominence of the right centrum semiovale most likely related to patient positioning. Slight focal prominence of the basilar tip. CT angiography is recommended for further evaluation.   CT HEAD 10/03/2015    No abnormal intracranial enhancement.   CTA NECK 10/03/2015   No hemodynamically significant stenosis or acute vascular process.   CTA HEAD 10/03/2015   No acute large vessel occlusion or high-grade stenosis. Dolicoectatic intracranial  vessels most commonly seen with chronic hypertension, less likely HIV.   MRI  10/04/2015 1. Patchy small volume acute ischemic cortical infarcts involving the right parietal lobe. No associated hemorrhage or mass effect. 2. Remote lacunar infarcts involving the right basal ganglia/corona radiata. 3. Mild age-related cerebral atrophy with chronic small vessel ischemic disease.  2D echo  - Left ventricle: The cavity size was normal. Wall thickness wasnormal. The estimated ejection fraction was 55%. Wall motion wasnormal; there were no regional wall motion abnormalities. Dopplerparameters are consistent with abnormal left ventricularrelaxation (grade 1 diastolic  dysfunction). - Aortic valve: There was no stenosis. - Mitral valve: There appeared to be a loose chord attached to themitral valve but there was no significant mitral regurgitation. - Right ventricle: The cavity size was normal. Systolic functionwas normal. - Tricuspid valve: Peak RV-RA gradient (S): 21 mm Hg. - Pulmonary arteries: PA peak pressure: 24 mm Hg (S). - Inferior vena cava: The vessel was normal in size. Therespirophasic diameter changes were in the normal range (>= 50%),consistent with normal central venous pressure. - Pericardium, extracardiac: There was no pericardial effusion. Impressions: Normal LV size with EF 55%. Normal RV size and systolic function.No significant valvular abnormalities.  LE venous doppler No evidence of DVT, superficial thrombosis, or Baker's Cyst.  TEE  Left Ventrical: Mild - moderate LV dysfunction , EF ~ 40% Mitral Valve: trace MR  Aortic Valve: normal AV  Tricuspid Valve: normal TV Pulmonic Valve: normal  Left Atrium/ Left atrial appendage: no thrombi Atrial septum: intact by color flow and Bubble contrast , no evidence of ASD or PFO  Aorta: mild atherosclerosis    PHYSICAL EXAM General - Well nourished, well developed, in no apparent distress.  Ophthalmologic - Sharp disc margins OU.   Cardiovascular - Regular rate and rhythm with no murmur.  Mental Status -  Level of arousal and orientation to time, place, and person were intact. Language including expression, naming, repetition, comprehension was assessed and found intact. Fund of Knowledge was assessed and was intact.  Cranial Nerves II - XII - II - Visual field intact OU. III, IV, VI - Extraocular movements intact. V - Facial sensation intact bilaterally. VII - Facial movement intact bilaterally. VIII - Hearing & vestibular intact bilaterally. X - Palate elevates symmetrically. XI - Chin turning & shoulder shrug intact bilaterally. XII - Tongue protrusion  intact.  Motor Strength - The patient's strength was normal in all extremities and pronator drift was absent.  Bulk was normal and fasciculations were absent.   Motor Tone - Muscle tone was assessed at the neck and appendages and was normal.  Reflexes - The patient's reflexes were 1+ in all extremities and she had no pathological reflexes.  Sensory - Light touch, temperature/pinprick were assessed and were symmetrical.    Coordination - The patient had normal movements in the hands and feet with no ataxia or dysmetria.  Tremor was absent.  Gait and Station - not tested due to safety concerns.   ASSESSMENT/PLAN Alicia Bolton is a 59 y.o. female with history of HTN, smoking, migraines, s/p chest mass surgery 7/16, s/p cervical fusion presenting with left hemiparesis. Patient was administered TPA 10/01/2105 at 2307.   Stroke: R ACA territory infarct s/p IV tPA, embolic secondary to unknown etiology, suspicious for atrial fibrillation   Resultant  resolved  CTA head and neck No significant large vessel stenosis  MRI  R posterior parietal lobe infarct  2D Echo  No source of embolus  TEE  negative for SOE.  LE venous doppler ruled out DVT If TEE negative, a [facility] electrophysiologist will consult and consider placement of an implantable loop recorder to evaluate for atrial fibrillation as etiology of stroke. This has been explained to patient/family by Dr. Erlinda Hong and they are agreeable.  hypercoagulable tests pending.   LDL 181  HgbA1c 5.9  Lovenox 40 mg sq daily  for VTE prophylaxis Diet Heart Room service appropriate?: Yes; Fluid consistency:: Thin  No antithrombotic prior to admission, started on aspirin 325 mg daily.   Ongoing aggressive stroke risk factor management  Therapy recommendations:  No therapy needs  Disposition:  Return home once workup completed  Palpitation  Pt complains palpitation since her chest sugery  Resume home  clonidine  Cardiac monitoring pending  Hypertension  Stable Permissive hypertension (OK if < 180/105) but gradually normalize in 5-7 days  Hyperlipidemia  Home meds:  No statin   LDL 181, goal < 70  Added lipitor 40mg    Continue statin at discharge  Tobacco abuse  Current smoker  Smoking cessation counseling provided  Pt is willing to quit  Other Stroke Risk Factors  ETOH use  Migraines  Hospital day # 3  Rosalin Hawking, MD PhD Stroke Neurology 10/05/2015 11:01 PM   To contact Stroke Continuity provider, please refer to [email or web address] After hours, contact General Neurology

## 2015-10-05 NOTE — Progress Notes (Signed)
VASCULAR LAB PRELIMINARY  PRELIMINARY  PRELIMINARY  PRELIMINARY  Bilateral lower extremity venous duplex completed.    Preliminary report:  Bilateral:  No evidence of DVT, superficial thrombosis, or Baker's Cyst.   Darris Carachure, RVS 10/05/2015, 12:52 PM

## 2015-10-05 NOTE — Progress Notes (Signed)
  Echocardiogram Echocardiogram Transesophageal has been performed.  Jennette Dubin 10/05/2015, 10:10 AM

## 2015-10-05 NOTE — Progress Notes (Signed)
Pt arrived on unit via wheelchair approx 2050 hrs. A&O, no obvious distress, no C/O at this time, NIHSS -0, Pt oriented to room and equipment.

## 2015-10-05 NOTE — Interval H&P Note (Signed)
History and Physical Interval Note:  10/05/2015 8:32 AM  Alicia Bolton  has presented today for surgery, with the diagnosis of STROKE  The various methods of treatment have been discussed with the patient and family. After consideration of risks, benefits and other options for treatment, the patient has consented to  Procedure(s): TRANSESOPHAGEAL ECHOCARDIOGRAM (TEE) (N/A) as a surgical intervention .  The patient's history has been reviewed, patient examined, no change in status, stable for surgery.  I have reviewed the patient's chart and labs.  Questions were answered to the patient's satisfaction.     Nahser, Wonda Cheng

## 2015-10-05 NOTE — H&P (View-Only) (Signed)
STROKE TEAM PROGRESS NOTE   HISTORY Alicia Bolton is an 59 y.o. female with a past medical history significant for HTN, smoking, migraines, s/p chest mass surgery 7/16, s/p cervical fusion, brought in by EMS as a code stroke due to acute onset of left hemiparesis. Patient lives at home and said that for unclear reason she lost bladder control while in bed, got up to go to the bathroom and almost fell because the left leg was significantly weak and she had to be help to get up from the floor. Then, she realized that her left arm was also very weak. EMS was summoned and documented the presence of left hemiparesis. Upon arrival to the ED she had initial NIHSS 8 with subsequent improvement mainly in her left arm after returning from the CT suite. CT brain showed no acute abnormality. Available labs also reviewed: platelet count 266, INR 0.97, PTT 28 Presently, she denies HA, vertigo, double vision, slurred speech, language or vision impairment. She was last known well 10/02/15 at 2125. NIHSS: 8. MRS: 0. Patient was administered TPA 10/01/2105 at 2307. She was admitted to the neuro ICU for further evaluation and treatment.   SUBJECTIVE (INTERVAL HISTORY) Family at the bedside. Patient feels she is a "miracle". RN reports increased HR this am with activity, up to 140. Pt reports a hx of tachycardia at home. She states she feels it in her chest. She was on clonidine for HTN at home and was on hold after admission, but she stated that she has fast HR with activity since the chest surgery.    OBJECTIVE Temp:  [97.9 F (36.6 C)-98.5 F (36.9 C)] 98.5 F (36.9 C) (12/06 0725) Pulse Rate:  [65-102] 91 (12/06 0600) Cardiac Rhythm:  [-] Normal sinus rhythm (12/06 0600) Resp:  [14-25] 17 (12/06 0500) BP: (99-139)/(73-99) 115/79 mmHg (12/06 0600) SpO2:  [97 %-100 %] 99 % (12/06 0600)  CBC:   Recent Labs Lab 10/02/15 2237 10/02/15 2303  WBC 10.4  --   NEUTROABS 4.6  --   HGB 13.8 16.3*  HCT 40.3 48.0*   MCV 88.8  --   PLT 266  --     Basic Metabolic Panel:   Recent Labs Lab 10/02/15 2237 10/02/15 2303  NA 139 141  K 3.9 3.7  CL 107 105  CO2 22  --   GLUCOSE 113* 106*  BUN 14 17  CREATININE 0.76 0.70  CALCIUM 9.2  --     Lipid Panel:     Component Value Date/Time   CHOL 241* 10/03/2015 0400   TRIG 84 10/03/2015 0400   HDL 43 10/03/2015 0400   CHOLHDL 5.6 10/03/2015 0400   VLDL 17 10/03/2015 0400   LDLCALC 181* 10/03/2015 0400   HgbA1c:  Lab Results  Component Value Date   HGBA1C 5.9* 10/03/2015   Urine Drug Screen: No results found for: LABOPIA, COCAINSCRNUR, LABBENZ, AMPHETMU, THCU, LABBARB    IMAGING  Ct Head Wo Contrast 10/02/2015  No acute intracranial hemorrhage. Slight asymmetric white matter prominence of the right centrum semiovale most likely related to patient positioning. Slight focal prominence of the basilar tip. CT angiography is recommended for further evaluation.   CT HEAD 10/03/2015    No abnormal intracranial enhancement.   CTA NECK 10/03/2015   No hemodynamically significant stenosis or acute vascular process.   CTA HEAD 10/03/2015   No acute large vessel occlusion or high-grade stenosis. Dolicoectatic intracranial vessels most commonly seen with chronic hypertension, less likely HIV.  MRI  10/04/2015 1. Patchy small volume acute ischemic cortical infarcts involving the right parietal lobe. No associated hemorrhage or mass effect. 2. Remote lacunar infarcts involving the right basal ganglia/corona radiata. 3. Mild age-related cerebral atrophy with chronic small vessel ischemic disease.  2D echo  - Left ventricle: The cavity size was normal. Wall thickness wasnormal. The estimated ejection fraction was 55%. Wall motion wasnormal; there were no regional wall motion abnormalities. Dopplerparameters are consistent with abnormal left ventricularrelaxation (grade 1 diastolic dysfunction). - Aortic valve: There was no stenosis. - Mitral valve:  There appeared to be a loose chord attached to themitral valve but there was no significant mitral regurgitation. - Right ventricle: The cavity size was normal. Systolic functionwas normal. - Tricuspid valve: Peak RV-RA gradient (S): 21 mm Hg. - Pulmonary arteries: PA peak pressure: 24 mm Hg (S). - Inferior vena cava: The vessel was normal in size. Therespirophasic diameter changes were in the normal range (>= 50%),consistent with normal central venous pressure. - Pericardium, extracardiac: There was no pericardial effusion. Impressions: Normal LV size with EF 55%. Normal RV size and systolic function.No significant valvular abnormalities.  LE venous doppler pending  TEE pending   PHYSICAL EXAM General - Well nourished, well developed, in no apparent distress.  Ophthalmologic - Sharp disc margins OU.   Cardiovascular - Regular rate and rhythm with no murmur.  Mental Status -  Level of arousal and orientation to time, place, and person were intact. Language including expression, naming, repetition, comprehension was assessed and found intact. Fund of Knowledge was assessed and was intact.  Cranial Nerves II - XII - II - Visual field intact OU. III, IV, VI - Extraocular movements intact. V - Facial sensation intact bilaterally. VII - Facial movement intact bilaterally. VIII - Hearing & vestibular intact bilaterally. X - Palate elevates symmetrically. XI - Chin turning & shoulder shrug intact bilaterally. XII - Tongue protrusion intact.  Motor Strength - The patient's strength was normal in all extremities and pronator drift was absent.  Bulk was normal and fasciculations were absent.   Motor Tone - Muscle tone was assessed at the neck and appendages and was normal.  Reflexes - The patient's reflexes were 1+ in all extremities and she had no pathological reflexes.  Sensory - Light touch, temperature/pinprick were assessed and were symmetrical.    Coordination - The patient  had normal movements in the hands and feet with no ataxia or dysmetria.  Tremor was absent.  Gait and Station - not tested due to safety concerns.   ASSESSMENT/PLAN Ms. Alicia Bolton is a 59 y.o. female with history of HTN, smoking, migraines, s/p chest mass surgery 7/16, s/p cervical fusion presenting with left hemiparesis. Patient was administered TPA 10/01/2105 at 2307.   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 Arranged with [facility] for tomorrow.(I have made patient NPO after midnight tonight).  Check bilateral lower extremity venous dopplers to rule out DVT as possible source of stroke.  If TEE negative, a [facility] electrophysiologist will consult and consider placement of an implantable loop recorder to evaluate for atrial fibrillation as etiology of stroke. This has been explained to patient/family by Dr. Erlinda Hong and  they are agreeable.  Given her young age, will check hypercoagulable tests once TEE resulted.   LDL 181  HgbA1c 5.9  Lovenox 40 mg sq daily  for VTE prophylaxis Diet Heart Room service appropriate?: Yes; Fluid consistency:: Thin  No antithrombotic prior to admission, started on aspirin 325 mg daily.   Ongoing aggressive stroke risk factor management  Therapy recommendations:  No therapy needs  Transfer to the floor  Disposition:  Return home once workup completed  Palpitation  Pt complains palpitation since her chest sugery  Resume home clonidine  TEE and potential loop recorder  Hypertension  Stable Permissive hypertension (OK if < 180/105) but gradually normalize in 5-7 days  Hyperlipidemia  Home meds:  No statin   LDL 181, goal <  70  Added lipitor 40mg    Continue statin at discharge  Tobacco abuse  Current smoker  Smoking cessation counseling provided  Pt is willing to quit  Other Stroke Risk Factors  ETOH use  Migraines  Hospital day # 2  Rosalin Hawking, MD PhD Stroke Neurology 10/04/2015 4:39 PM   To contact Stroke Continuity provider, please refer to [email or web address] After hours, contact General Neurology

## 2015-10-05 NOTE — CV Procedure (Signed)
    Transesophageal Echocardiogram Note  Alicia Bolton [id number] December 13, 1955  Procedure: Transesophageal Echocardiogram Indications: CVA  Procedure Details Consent: Obtained Time Out: Verified patient identification, verified procedure, site/side was marked, verified correct patient position, special equipment/implants available, Radiology Safety Procedures followed,  medications/allergies/relevent history reviewed, required imaging and test results available.  Performed  Medications: Fentanyl: 75 mcg iv  Versed: 4 mg IV   Left Ventrical:  Mild - moderate  LV dysfunction ,  EF ~ 40%  Mitral Valve: trace MR   Aortic Valve: normal AV   Tricuspid Valve: normal TV  Pulmonic Valve: normal   Left Atrium/ Left atrial appendage: no thrombi  Atrial septum: intact by color flow and Bubble contrast , no evidence of ASD or PFO   Aorta: mild atherosclerosis    Complications: No apparent complications Patient did tolerate procedure well.   Thayer Headings, Brooke Bonito., MD, [facility] 10/05/2015, 9:18 AM

## 2015-10-05 NOTE — Progress Notes (Signed)
Physical Therapy Treatment Patient Details Name: Alicia Bolton MRN: [id number] DOB: 1956-06-17 Today's Date: 10/05/2015    History of Present Illness pt presents with Patchy Small R Parietal Infarcts.  pt with hx of Fibromyalgia, Migraines, HTN, Cervical Fusion, and Benign Chest Mass Removed.      PT Comments    Patient able to ambulate in hall and negotiate steps this session with HR max of 131.  Still weak in L LE but improves gait and independence with cane.  Will benefit from HHPT at d/c for continued gait and strength training.  Will follow up if not d/c home.  Follow Up Recommendations  Home health PT     Equipment Recommendations  None recommended by PT (has a cane already at home)    Recommendations for Other Services       Precautions / Restrictions Precautions Precautions: Fall Precaution Comments: watch HR    Mobility  Bed Mobility Overal bed mobility: Modified Independent                Transfers     Transfers: Sit to/from Stand Sit to Stand: Supervision         General transfer comment: momentum strategy not using UE's  Ambulation/Gait Ambulation/Gait assistance: Min guard;Supervision Ambulation Distance (Feet): 200 Feet (and 150' with cane) Assistive device: None Gait Pattern/deviations: Step-through pattern;Decreased stride length     General Gait Details: wary of L LE weakness; HR up to 131 with ambulation and stair negotiation; down to 118 with standing rest for 2 minutes; second walk with cane and after assist/education on sequencing able to walk with supervision and reports improved confidence   Stairs Stairs: Yes Stairs assistance: Supervision Stair Management: One rail Right;Step to pattern;Sideways Number of Stairs: 10 General stair comments: 2 hands to one rail, educated how to negotiate steps with rail and cane if using cane  Wheelchair Mobility    Modified Rankin (Stroke Patients Only) Modified Rankin (Stroke Patients  Only) Pre-Morbid Rankin Score: No significant disability Modified Rankin: Moderately severe disability     Balance             Standing balance-Leahy Scale: Fair                      Cognition Arousal/Alertness: Awake/alert Behavior During Therapy: WFL for tasks assessed/performed Overall Cognitive Status: Within Functional Limits for tasks assessed                      Exercises      General Comments        Pertinent Vitals/Pain Pain Assessment: No/denies pain    Home Living                      Prior Function            PT Goals (current goals can now be found in the care plan section) Progress towards PT goals: Progressing toward goals    Frequency  Min 4X/week    PT Plan Discharge plan needs to be updated    Co-evaluation             End of Session Equipment Utilized During Treatment: Gait belt Activity Tolerance: Patient tolerated treatment well (though HR still increases with ambulation 131 max) Patient left: in bed;with call bell/phone within reach     Time: 1605-1630 PT Time Calculation (min) (ACUTE ONLY): 25 min  Charges:  $Gait Training: 23-37 mins  G Codes:      Glorene Leitzke,CYNDI 10/05/2015, 4:39 PM Magda Kiel, [place] 10/05/2015

## 2015-10-05 NOTE — Consult Note (Signed)
ELECTROPHYSIOLOGY CONSULT NOTE  Patient ID: Alicia Bolton MRN: [id number], DOB/AGE: 1956-04-21   Admit date: 10/02/2015 Date of Consult: 10/05/2015  Primary Physician: Vic Blackbird, MD Primary Cardiologist: none Reason for Consultation: Cryptogenic stroke - 10/02/15; recommendations regarding Implantable Loop Recorder  History of Present Illness Alicia Bolton was admitted on 10/02/2015 with acute onset of L hemiparesis and treated with tPA .  They first developed symptoms while in bed, felt like she needed to go to the BR and before she got up, she lost bladder control, she went to get out of bed and fel to the floor, with significant LLE weakness, she was unable to get up calling for help, and 911 was called.  Her PMHx includes HTN, smoking (recently quit), migraines, s/p chest mass surgery 05/08/2015, (Care everywhere states s/p hemisternotomy, mediastinal mass resection, and thymectomy) and hx of s/p cervical fusion. Imaging demonstrated R ACA territory infarct, embolic of unknown etiology.  she has undergone workup for stroke including echocardiogram and neck angio.  The patient has been monitored on telemetry which has demonstrated sinus rhythm with no arrhythmias.  Inpatient stroke work-up was completed with a TEE this morning, results without source of stroke and LE venous US prelim is negative for DVT.  Echocardiogram this admission demonstrated Study Conclusions  Left ventricle: The cavity size was normal. Wall thickness was normal. The estimated ejection fraction was 55%. Wall motion was normal; there were no regional wall motion abnormalities. Doppler parameters are consistent with abnormal left ventricular relaxation (grade 1 diastolic dysfunction). - Aortic valve: There was no stenosis. - Mitral valve: There appeared to be a loose chord attached to the mitral valve but there was no significant mitral regurgitation. - Right ventricle: The cavity size was normal.  Systolic function was normal. - Tricuspid valve: Peak RV-RA gradient (S): 21 mm Hg. - Pulmonary arteries: PA peak pressure: 24 mm Hg (S). - Inferior vena cava: The vessel was normal in size. The respirophasic diameter changes were in the normal range (>= 50%), consistent with normal central venous pressure. - Pericardium, extracardiac: There was no pericardial effusion. Impressions: - Normal LV size with EF 55%. Normal RV size and systolic function. No significant valvular abnormalities.  Lab work is reviewed.  Prior to admission, the patient denies chest pain, shortness of breath, dizziness,  or syncope. She does though have about 5-29months of increasing frequency of palpitations.  She states historically only once in a while would she feel her heart beating fast/funny, but since her mass removal notes particularly with stairs, she feels like her heart "goes crazy" like it is beating "really fast", she thinks infrequently brief quick beats without exertion.  She reports she can feel the fast HR 2-3x per week, lasting a few minutes, no associated symptoms with them  She is recovering from their stroke with plans to home at discharge.  EP has been asked to evaluate for placement of an implantable loop recorder to monitor for atrial fibrillation.     Past Medical History  Diagnosis Date  . Hypertension   . Migraines   . Smoker   . Neuromuscular disorder ([place])   . PONV (postoperative nausea and vomiting)   . Tear of medial meniscus of right knee 10/09/2013  . Arthritis      Surgical History:  Past Surgical History  Procedure Laterality Date  . Cervical fusion  2007  . Cesarean section  1985  . Abdominal hysterectomy  1997  . Cholecystectomy  1998  .  Knee arthroscopy Right 1999  . Colonoscopy    . Knee arthroscopy with medial menisectomy Right 10/09/2013    Procedure: RIGHT KNEE ARTHROSCOPY WITH MEDIAL MENISECTOMY;  Surgeon: Johnny Bridge, MD;  Location: [facility];  Service: Orthopedics;  Laterality: Right;  clean  . Anterior cervical decomp/discectomy fusion N/A 10/25/2014    Procedure: CERVICAL THREE-FOUR, CERVICAL FOUR FIVE ANTERIOR CERVICAL DECOMPRESSION/DISCECTOMY FUSION 2 LEVEL/HARDWARE REMOVALCERVICAL FIVE-SEVEN;  Surgeon: Elaina Hoops, MD;  Location: [facility];  Service: Neurosurgery;  Laterality: N/A;  . Carpal tunnel release Left 10/25/2014    Procedure: CARPAL TUNNEL RELEASE;  Surgeon: Elaina Hoops, MD;  Location: [facility];  Service: Neurosurgery;  Laterality: Left;     Prescriptions prior to admission  Medication Sig Dispense Refill Last Dose  . amLODipine (NORVASC) 10 MG tablet TAKE ONE TABLET BY MOUTH EVERY DAY 30 tablet 0 10/02/2015 at Unknown time  . cloNIDine (CATAPRES) 0.1 MG tablet TAKE ONE TABLET BY MOUTH TWICE DAILY 60 tablet 0 10/02/2015 at Unknown time  . celecoxib (CELEBREX) 100 MG capsule Take 1 capsule (100 mg total) by mouth 2 (two) times daily. (Patient not taking: Reported on 10/02/2015) 60 capsule 3 Not Taking at Unknown time  . chlorzoxazone (PARAFON FORTE DSC) 500 MG tablet Take 0.5 tablets (250 mg total) by mouth 3 (three) times daily. (Patient not taking: Reported on 10/02/2015) 40 tablet 0 Not Taking at Unknown time  . HYDROcodone-acetaminophen (NORCO) 5-325 MG per tablet Take 1 tablet by mouth every 6 (six) hours as needed for moderate pain. (Patient not taking: Reported on 10/02/2015) 20 tablet 0 Not Taking at Unknown time  . oxyCODONE-acetaminophen (ROXICET) 5-325 MG/5ML solution Take 5 mLs by mouth every 4 (four) hours as needed for severe pain. (Patient not taking: Reported on 10/02/2015) 500 mL 0 Not Taking at Unknown time  . SUMAtriptan (IMITREX) 100 MG tablet Take 1 tablet (100 mg total) by mouth every 2 (two) hours as needed for migraine. (Patient not taking: Reported on 10/02/2015) 9 tablet 3 Not Taking at Unknown time  . valACYclovir (VALTREX) 500 MG tablet Take 1 tablet (500 mg total) by mouth daily.  (Patient not taking: Reported on 10/02/2015) 30 tablet 6 Not Taking at Unknown time    Inpatient Medications:  . aspirin  325 mg Oral Daily  . atorvastatin  40 mg Oral q1800  . cloNIDine  0.1 mg Oral BID  . enoxaparin (LOVENOX) injection  40 mg Subcutaneous Q24H  . pantoprazole  40 mg Oral Daily    Allergies:  Allergies  Allergen Reactions  . Floxin [Ofloxacin]     Makes sick to stomach and vomit  . Penicillins Rash    Social History   Social History  . Marital Status: Single    Spouse Name: N/A  . Number of Children: N/A  . Years of Education: N/A   Occupational History  . Not on file.   Social History Main Topics  . Smoking status: Former Smoker    Types: Cigarettes    Quit date: 07/07/2013  . Smokeless tobacco: Never Used  . Alcohol Use: Yes     Comment: rare  . Drug Use: No  . Sexual Activity: Not on file   Other Topics Concern  . Not on file   Social History Narrative     Family History  Problem Relation Age of Onset  . Hypertension Mother   . Diabetes Mother   . Cancer Maternal Grandmother  Review of Systems: All other systems reviewed and are otherwise negative except as noted above.  Physical Exam: Filed Vitals:   10/05/15 0940 10/05/15 0945 10/05/15 1512 10/05/15 1632  BP: 120/86 122/84 112/82   Pulse: 81 85 84 131  Temp:   98.3 F (36.8 C)   TempSrc:   Oral   Resp: 20 22 20    Height:      Weight:      SpO2: 96% 95% 98%    GEN- The patient is well appearing, alert and oriented x 3 today.   Head- normocephalic, atraumatic Eyes-  Sclera clear, conjunctiva pink Ears- hearing intact Neck- supple Lungs- Clear to ausculation bilaterally, normal work of breathing Heart- Regular rate and rhythm, no murmurs, rubs or gallops  GI- soft, NT, ND Extremities- no clubbing, cyanosis, or edema MS- no significant deformity or atrophy Skin- no rash or lesion Psych- euthymic mood, full affect   Labs:   Lab Results  Component Value Date     WBC 10.4 10/02/2015   HGB 16.3* 10/02/2015   HCT 48.0* 10/02/2015   MCV 88.8 10/02/2015   PLT 266 10/02/2015     Recent Labs Lab 10/02/15 2237 10/02/15 2303  NA 139 141  K 3.9 3.7  CL 107 105  CO2 22  --   BUN 14 17  CREATININE 0.76 0.70  CALCIUM 9.2  --   PROT 7.5  --   BILITOT 0.7  --   ALKPHOS 98  --   ALT 9*  --   AST 17  --   GLUCOSE 113* 106*   No results found for: CKTOTAL, CKMB, CKMBINDEX, TROPONINI Lab Results  Component Value Date   CHOL 241* 10/03/2015   CHOL 193 10/04/2014   Lab Results  Component Value Date   HDL 43 10/03/2015   HDL 42 10/04/2014   Lab Results  Component Value Date   LDLCALC 181* 10/03/2015   LDLCALC 119* 10/04/2014   Lab Results  Component Value Date   TRIG 84 10/03/2015   TRIG 159* 10/04/2014   Lab Results  Component Value Date   CHOLHDL 5.6 10/03/2015   CHOLHDL 4.6 10/04/2014    Radiology/Studies:  Ct Angio Head W/cm &/or Wo Cm 10/03/2015  CLINICAL DATA:  Code stroke yesterday, acute onset bladder incontinence and LEFT-sided weakness, LEFT facial droop. Received tPA. History migraines. EXAM: CT ANGIOGRAPHY HEAD AND NECK TECHNIQUE: Multidetector CT imaging of the head and neck was performed using the standard protocol during bolus administration of intravenous contrast. Multiplanar CT image reconstructions and MIPs were obtained to evaluate the vascular anatomy. Carotid stenosis measurements (when applicable) are obtained utilizing NASCET criteria, using the distal internal carotid diameter as the denominator. CONTRAST:  67mL OMNIPAQUE IOHEXOL 350 MG/ML SOLN COMPARISON:  None. CT head October 02, 2015 FINDINGS: CT HEAD No abnormal intracranial enhancement. CTA NECK Aortic arch: Normal appearance of the thoracic arch, normal branch pattern. Mild calcific atherosclerosis. The origins of the innominate, left Common carotid artery and subclavian artery are widely patent. Right carotid system: Common carotid artery is widely patent,  coursing in a straight line fashion. Normal appearance of the carotid bifurcation without hemodynamically significant stenosis by NASCET criteria. Normal appearance of the included internal carotid artery, developmentally dominant. Left carotid system: Common carotid artery is widely patent, coursing in a straight line fashion. Normal appearance of the carotid bifurcation without hemodynamically significant stenosis by NASCET criteria. Normal appearance of the included internal carotid artery. Vertebral arteries:Codominant vertebral artery's. Normal appearance of the  vertebral arteries, which appear widely patent. Skeleton: No acute osseous process though bone windows have not been submitted. Status post median sternotomy. Status post CE the re- through C5 ACDF, partially incorporated interbody fusion material. Intact well-seated hardware. Solid C6-7 interbody fusion, status post removal of ACDF hardware. Poor dentition, multiple dental caries. Other neck: Soft tissues of the neck are nonacute though, not tailored for evaluation. Surgical clips appearing mediastinal and CTA HEAD Anterior circulation: Normal appearance of the cervical internal carotid arteries, petrous, cavernous and supra clinoid internal carotid arteries. Widely patent anterior communicating artery. Bilateral anterior cerebral arteries predominately arise from RIGHT A1-2 junction with diminutive LEFT A1 segment. Dolicoectatic intracranial vessels. Posterior circulation: Widely patent vertebral arteries, vertebrobasilar junction and basilar artery, as well as main branch vessels. Dolicoectatic intracranial vessels. Normal appearance of the posterior cerebral arteries. No large vessel occlusion, hemodynamically significant stenosis, dissection, luminal irregularity, contrast extravasation or aneurysm within the anterior nor posterior circulation. IMPRESSION: CT HEAD:  No abnormal intracranial enhancement. CTA NECK: No hemodynamically significant  stenosis or acute vascular process. CTA HEAD: No acute large vessel occlusion or high-grade stenosis. Dolicoectatic intracranial vessels most commonly seen with chronic hypertension, less likely HIV. Electronically Signed   By: Elon Alas M.D.   On: 10/03/2015 01:48   Ct Head Wo Contrast 10/02/2015  CLINICAL DATA:  59 year old female with code stroke left-sided weakness and facial droop. EXAM: CT HEAD WITHOUT CONTRAST TECHNIQUE: Contiguous axial images were obtained from the base of the skull through the vertex without intravenous contrast. COMPARISON:  None. FINDINGS: The ventricles and sulci are appropriate in size for the patient's age. There is no intracranial hemorrhage. No mass effect or midline shift identified. There is apparent asymmetric prominence of the white matter in the right convexity most likely related to imaging technique and slightly to the positioning of the patient. Underlying edema is less likely. Clinical correlation is recommended. MRI may provide better evaluation if there is high clinical suspicion for acute infarct. Small old right basal ganglia lacunar infarct noted. The gray-white matter differentiation is preserved. There is no extra-axial fluid collection. There is diffuse increased vascular densities likely related to hemoconcentration. There is slight apparent focal prominence of the basilar tip measuring up to 6 mm (series 2, image 12). An aneurysm is not excluded. CT angiography is recommended for further evaluation. The visualized paranasal sinuses and mastoid air cells are well aerated. The calvarium is intact. IMPRESSION: No acute intracranial hemorrhage. Slight asymmetric white matter prominence of the right centrum semiovale most likely related to patient positioning. Slight focal prominence of the basilar tip. CT angiography is recommended for further evaluation. These results were called by telephone at the time of interpretation on 10/02/2015 at 10:52 pm to Dr.  Armida Sans, who verbally acknowledged these results. Electronically Signed   By: Anner Crete M.D.   On: 10/02/2015 22:58   Mr Brain Wo Contrast 10/04/2015  CLINICAL DATA:  Initial evaluation for acute left-sided weakness. Status post tPA. EXAM: MRI HEAD WITHOUT CONTRAST TECHNIQUE: Multiplanar, multiecho pulse sequences of the brain and surrounding structures were obtained without intravenous contrast. COMPARISON:  Prior study from earlier the same day. FINDINGS: Patchy small volume foci of restricted diffusion present within the cortical gray matter of the parasagittal right parietal lobe near the vertex (series 4, image 42, 35). No other infarct. No mass effect or associated hemorrhage. Normal intracranial vascular flow voids preserved. Dolichoectatic intracranial circulation noted. Cerebral volume within normal limits for patient age. Patchy T2/FLAIR hyperintensity within the periventricular  and deep white matter both cerebral hemispheres present, most consistent with chronic small vessel ischemic disease. Similar changes seen within the pons. Small remote lacunar infarct within the right lentiform nuclei/corona radiata out. No mass lesion, midline shift, or mass effect. No hydrocephalus. No extra-axial fluid collection. Craniocervical junction within normal limits. Sequela of prior ACDF partially visualized within the cervical spine. Pituitary gland within normal limits. No acute abnormality about the orbits. Mild mucosal thickening within the ethmoidal air cells. Paranasal sinuses are otherwise clear. No mastoid effusion. Inner ear structures within normal limits. Bone marrow signal intensity within normal limits. No scalp soft tissue abnormality. IMPRESSION: 1. Patchy small volume acute ischemic cortical infarcts involving the right parietal lobe. No associated hemorrhage or mass effect. 2. Remote lacunar infarcts involving the right basal ganglia/corona radiata. 3. Mild age-related cerebral atrophy with  chronic small vessel ischemic disease. Electronically Signed   By: Jeannine Boga M.D.   On: 10/04/2015 00:31    12-lead ECG is SR All prior EKG's in EPIC reviewed with no documented atrial fibrillation  Telemetry SR only  Assessment and Plan:  1. Cryptogenic stroke The patient presents with cryptogenic stroke.  I spoke at length with the patient about monitoring for afib with either a 30 day event monitor or an implantable loop recorder.  Risks, benefits, and alteratives to implantable loop recorder were discussed with the patient today.  She has some history of palpitations as described and will plan for 30day event monitor, if no AFib can plan for ILR, the patient is agreeable with this plan. We will order the monitor for her, and arrange EP outpatient f/u.   Please call with questions.   Baldwin Jamaica, PA-C 10/05/2015   As above Cryptogenic stroke with palpitations   Will use event recorder and arrange EP followup

## 2015-10-06 ENCOUNTER — Encounter (HOSPITAL_COMMUNITY)
Admission: EM | Disposition: A | Discharge status: Home Health | Payer: Self-pay | Source: Home / Self Care | Attending: Neurology

## 2015-10-06 ENCOUNTER — Encounter (HOSPITAL_COMMUNITY): Payer: Self-pay | Admitting: Cardiovascular Disease

## 2015-10-06 DIAGNOSIS — I63422 Cerebral infarction due to embolism of left anterior cerebral artery: Secondary | ICD-10-CM

## 2015-10-06 HISTORY — PX: EP IMPLANTABLE DEVICE: SHX172B

## 2015-10-06 LAB — ANCA TITERS
Atypical P-ANCA titer: <1:20 {titer}
P-ANCA: <1:20 {titer}

## 2015-10-06 LAB — SJOGRENS SYNDROME-A EXTRACTABLE NUCLEAR ANTIBODY: SSA (Ro) (ENA) Antibody, IgG: <0.2 AI (ref 0.0–0.9)

## 2015-10-06 LAB — HIV ANTIBODY (ROUTINE TESTING W REFLEX): HIV SCREEN 4TH GENERATION: NONREACTIVE

## 2015-10-06 LAB — ANTI-DNA ANTIBODY, DOUBLE-STRANDED

## 2015-10-06 LAB — ANTINUCLEAR ANTIBODIES, IFA: ANTINUCLEAR ANTIBODIES, IFA: NEGATIVE

## 2015-10-06 LAB — RPR: RPR: NONREACTIVE

## 2015-10-06 LAB — HOMOCYSTEINE: HOMOCYSTEINE-NORM: 16.3 umol/L — AB (ref 0.0–15.0)

## 2015-10-06 LAB — CARDIOLIPIN ANTIBODIES, IGG, IGM, IGA: Anticardiolipin IgA: <9 APL U/mL (ref 0–11)

## 2015-10-06 LAB — C3 COMPLEMENT: C3 Complement: 157 mg/dL (ref 82–167)

## 2015-10-06 LAB — SJOGRENS SYNDROME-B EXTRACTABLE NUCLEAR ANTIBODY: SSB (La) (ENA) Antibody, IgG: <0.2 AI (ref 0.0–0.9)

## 2015-10-06 LAB — C4 COMPLEMENT: COMPLEMENT C4, BODY FLUID: 56 mg/dL — AB (ref 14–44)

## 2015-10-06 LAB — COMPLEMENT, TOTAL

## 2015-10-06 SURGERY — LOOP RECORDER INSERTION
Anesthesia: LOCAL

## 2015-10-06 MED ORDER — ASPIRIN 325 MG PO TABS: 325.0000 mg | ORAL_TABLET | Freq: Every day | ORAL | Status: DC

## 2015-10-06 MED ORDER — WHITE PETROLATUM GEL
Status: AC
  Filled 2015-10-06: qty 1

## 2015-10-06 MED ORDER — PRAVASTATIN SODIUM 40 MG PO TABS: 80.0000 mg | ORAL_TABLET | Freq: Every day | ORAL | Status: DC

## 2015-10-06 MED ORDER — PRAVASTATIN SODIUM 80 MG PO TABS: 80.0000 mg | ORAL_TABLET | Freq: Every day | ORAL | Status: DC

## 2015-10-06 MED ORDER — LIDOCAINE-EPINEPHRINE 1 %-1:100000 IJ SOLN
INTRAMUSCULAR | Status: AC
  Filled 2015-10-06: qty 1

## 2015-10-06 MED ORDER — LIDOCAINE-EPINEPHRINE 1 %-1:100000 IJ SOLN
INTRAMUSCULAR | Status: DC | PRN
  Administered 2015-10-06: 12 mL via INTRADERMAL

## 2015-10-06 SURGICAL SUPPLY — 2 items
LOOP REVEAL LINQSYS (Prosthesis & Implant Heart) ×1 IMPLANT
PACK LOOP INSERTION (CUSTOM PROCEDURE TRAY) ×2 IMPLANT

## 2015-10-06 NOTE — Care Management Note (Signed)
Case Management Note  Patient Details  Name: IDA MILBRATH MRN: [id number] Date of Birth: May 29, 1956  Subjective/Objective:                    Action/Plan: CM has met with patient several times to discuss meds and contacting Medicare to increase her benefits to medicine coverage. Patient has been on the phone today with Medicare and Social Security. CM spoke with Dr Erlinda Hong and asked that her medications be adjusted as appropriate to help the patient afford her meds at discharge. Per Dr Erlinda Hong patient will discharge on Clonidine, and a statin that is off the $4 list at [facility] and an ASA. Patient states she is able to afford this and will continue to work on her Medicare D coverage.   Expected Discharge Date:                  Expected Discharge Plan:  Home/Self Care  In-House Referral:  Clinical Social Work  Discharge planning Services  CM Consult  Post Acute Care Choice:    Choice offered to:     DME Arranged:    DME Agency:     HH Arranged:    HH Agency:     Status of Service:  In process, will continue to follow  Medicare Important Message Given:  Yes Date Medicare IM Given:    Medicare IM give by:    Date Additional Medicare IM Given:    Additional Medicare Important Message give by:     If discussed at [place] of Stay Meetings, dates discussed:    Additional Comments:  Pollie Friar, RN 10/06/2015, 3:29 PM

## 2015-10-06 NOTE — Care Management Note (Signed)
Case Management Note  Patient Details  Name: Alicia Bolton MRN: [id number] Date of Birth: April 14, 1956  Subjective/Objective:                    Action/Plan: Patient discharging home today with home health orders. Patient off the unit for loop recorder placement. CM left list of home health agencies in the [facility] area with the patients family to look over. CM will contact the patient in the am via phone and set up her home health. Bedside RN updated.   Expected Discharge Date:                  Expected Discharge Plan:  [place]  In-House Referral:  Clinical Social Work  Discharge planning Services  CM Consult  Post Acute Care Choice:    Choice offered to:     DME Arranged:    DME Agency:     HH Arranged:    [place] Agency:     Status of Service:  In process, will continue to follow  Medicare Important Message Given:  Yes Date Medicare IM Given:    Medicare IM give by:    Date Additional Medicare IM Given:    Additional Medicare Important Message give by:     If discussed at [place] of Stay Meetings, dates discussed:    Additional Comments:  Pollie Friar, RN 10/06/2015, 4:53 PM

## 2015-10-06 NOTE — Progress Notes (Signed)
Pt for discharge home today. Discharge orders received. Loop recorder completed and patient viewed video and received education. IV and telemetry dcd.Discharge instructions and prescriptions called into  given with verbalized understanding. Family at bedside to assist with discharge. Staff brought patient to lobby via wheelchair at 1810.Transported to home by family member.

## 2015-10-06 NOTE — Progress Notes (Signed)
SUBJECTIVE: The patient is doing well today.  At this time, she denies chest pain, shortness of breath, or any new concerns.  Marland Kitchen aspirin  325 mg Oral Daily  . cloNIDine  0.1 mg Oral BID  . enoxaparin (LOVENOX) injection  40 mg Subcutaneous Q24H  . pravastatin  80 mg Oral q1800  . white petrolatum          OBJECTIVE: Physical Exam: Filed Vitals:   10/06/15 0104 10/06/15 0521 10/06/15 0934 10/06/15 1316  BP: 107/82 108/77 128/81 114/81  Pulse: 80 88 91 97  Temp: 98 F (36.7 C) 97.9 F (36.6 C) 97.8 F (36.6 C) 98.4 F (36.9 C)  TempSrc: Oral Oral Oral Oral  Resp: 20 20 20 20   Height:      Weight:      SpO2: 99% 99% 99% 100%   No intake or output data in the 24 hours ending 10/06/15 1609  Telemetry reveals sinus rhythm only  GEN- The patient is well appearing, alert and oriented x 3 today.   Head- normocephalic, atraumatic Eyes-  Sclera clear, conjunctiva pink Ears- hearing intact Neck- supple, no JVP Lungs- Clear to ausculation bilaterally, normal work of breathing Heart- Regular rate and rhythm, no significant murmurs, no rubs or gallops GI- soft, NT, ND, + BS Extremities- no clubbing, cyanosis, or edema Skin- no rash or lesion Psych- euthymic mood, full affect Neuro- patient reports near complete resolution of her symptoms  10/05/15  TEE Left Ventrical: Mild - moderate LV dysfunction , EF ~ 40% Mitral Valve: trace MR  Aortic Valve: normal AV  Tricuspid Valve: normal TV Pulmonic Valve: normal  Left Atrium/ Left atrial appendage: no thrombi Atrial septum: intact by color flow and Bubble contrast , no evidence of ASD or PFO  Aorta: mild atherosclerosis   Echocardiogram this admission demonstrated Study Conclusions Left ventricle: The cavity size was normal. Wall thickness was normal. The estimated ejection fraction was 55%. Wall motion was normal; there were no regional wall motion abnormalities. Doppler parameters are consistent with abnormal  left ventricular relaxation (grade 1 diastolic dysfunction). - Aortic valve: There was no stenosis. - Mitral valve: There appeared to be a loose chord attached to the mitral valve but there was no significant mitral regurgitation. - Right ventricle: The cavity size was normal. Systolic function was normal. - Tricuspid valve: Peak RV-RA gradient (S): 21 mm Hg. - Pulmonary arteries: PA peak pressure: 24 mm Hg (S). - Inferior vena cava: The vessel was normal in size. The respirophasic diameter changes were in the normal range (>= 50%), consistent with normal central venous pressure. - Pericardium, extracardiac: There was no pericardial effusion. Impressions: - Normal LV size with EF 55%. Normal RV size and systolic function. No significant valvular abnormalities.  10/05/15: LE venous US Summary: - No evidence of deep vein or superficial thrombosis involving the  right lower extremity and left lower extremity. - No evidence of Baker&'s cyst on the right or left.      ASSESSMENT AND PLAN:  Active Problems:   Stroke with cerebral ischemia ([place])  1. Stroke: R ACA territory infarct s/p IV tPA, embolic secondary to unknown etiology, suspicious for atrial fibrillation     The patient has symptoms of palpitations, more so since her mediastinal mass surgery in July, we originally planned for 30 day event monitoring, though she has done investigation into the cost and coverage for this and does not have out patient coverage.  Loop implant is a reasonable plan for  her as well, she understandsthat there is still a monthly monitoring fee for this and states she will be able to manage this.     Labs are reviewed, normal WBC count.     She has had an extensive chest surgery earlier this year, s/p hemisternotomy, mediastinal mass resection, and thymectomy on 05/08/15, as per her care everywhere record.    We will plan for loop implant today.  Tommye Standard, PA-C 10/06/2015 4:09  PM   I have seen, examined the patient, and reviewed the above assessment and plan.  On exam, comfortable appearing Changes to above are made where necessary.    We discussed 30 day monitor vs implantable loop recorder.  She is very clear that she would prefer implantable loop recorder.  Risks of procedure discussed with patient who wishes to proceed.  Co Sign: Thompson Grayer, MD 10/06/2015 4:54 PM

## 2015-10-06 NOTE — Progress Notes (Signed)
Occupational Therapy Treatment Patient Details Name: Alicia Bolton MRN: [id number] DOB: [birth year] Today's Date: 10/06/2015    History of present illness pt presents with Patchy Small R Parietal Infarcts.  pt with hx of Fibromyalgia, Migraines, HTN, Cervical Fusion, and Benign Chest Mass Removed.     OT comments  Pt performing bathing and dressing with set up, toileting and grooming with supervision for safety.  Recommending pt sit for showering and educated in use of 3 in 1 as shower seat.  Pt verbalizing understanding of all.  Good safety awareness. Pt hopeful to go home today.  Follow Up Recommendations  No OT follow up    Equipment Recommendations  3 in 1 bedside comode    Recommendations for Other Services      Precautions / Restrictions Precautions Precautions: Fall Precaution Comments: watch HR       Mobility Bed Mobility Overal bed mobility: Modified Independent                Transfers                      Balance     Sitting balance-Leahy Scale: Good       Standing balance-Leahy Scale: Fair                     ADL Overall ADL's : Needs assistance/impaired     Grooming: Supervision/safety;Standing;Oral care;Wash/dry hands;Wash/dry face       Lower Body Bathing: Sit to/from stand;Modified independent       Lower Body Dressing: Modified independent;Sit to/from stand   Toilet Transfer: Supervision/safety;Ambulation;Regular Toilet   Toileting- Clothing Manipulation and Hygiene: Modified independent;Sit to/from stand   Tub/ Banker: Tub transfer;Supervision/safety;3 in 1;Ambulation   Functional mobility during ADLs: Min guard        Vision                     Perception     Praxis      Cognition   Behavior During Therapy: WFL for tasks assessed/performed Overall Cognitive Status: Within Functional Limits for tasks assessed                       Extremity/Trunk Assessment               Exercises     Shoulder Instructions       General Comments      Pertinent Vitals/ Pain       Pain Assessment: No/denies pain  Home Living                                          Prior Functioning/Environment              Frequency Min 2X/week     Progress Toward Goals  OT Goals(current goals can now be found in the care plan section)  Progress towards OT goals: Progressing toward goals  Acute Rehab OT Goals Patient Stated Goal: Home  Plan Discharge plan remains appropriate    Co-evaluation                 End of Session Equipment Utilized During Treatment: Gait belt   Activity Tolerance Patient tolerated treatment well   Patient Left in chair;with call bell/phone within reach   Nurse Communication  Time: [id number] OT Time Calculation (min): 15 min  Charges: OT General Charges $OT Visit: 1 Procedure OT Treatments $Self Care/Home Management : 8-22 mins  Malka So 10/06/2015, 9:27 AM  [phone]

## 2015-10-06 NOTE — Discharge Summary (Signed)
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  Past Medical History  Diagnosis Date  . Hypertension   . Migraines   . Smoker   . Neuromuscular disorder ([place])   . PONV (postoperative nausea and vomiting)   . Tear of medial meniscus of right knee 10/09/2013  . Arthritis    Past Surgical History  Procedure Laterality Date  . Cervical fusion  2007  . Cesarean section  1985  . Abdominal hysterectomy  1997  . Cholecystectomy  1998  . Knee arthroscopy Right 1999  . Colonoscopy    . Knee arthroscopy with medial menisectomy Right 10/09/2013    Procedure: RIGHT KNEE ARTHROSCOPY WITH MEDIAL MENISECTOMY;  Surgeon: Johnny Bridge, MD;  Location: [facility];  Service: Orthopedics;  Laterality: Right;  clean  . Anterior cervical decomp/discectomy fusion N/A 10/25/2014    Procedure: CERVICAL THREE-FOUR, CERVICAL FOUR FIVE ANTERIOR CERVICAL DECOMPRESSION/DISCECTOMY FUSION 2 LEVEL/HARDWARE REMOVALCERVICAL FIVE-SEVEN;  Surgeon: Elaina Hoops, MD;  Location: [facility];  Service: Neurosurgery;  Laterality: N/A;  . Carpal tunnel release Left 10/25/2014    Procedure: CARPAL TUNNEL RELEASE;  Surgeon: Elaina Hoops, MD;  Location: [facility];  Service: Neurosurgery;  Laterality: Left;  . Tee without cardioversion N/A 10/05/2015    Procedure: TRANSESOPHAGEAL ECHOCARDIOGRAM (TEE);  Surgeon: Thayer Headings, MD;  Location[facility];  Service: Cardiovascular;  Laterality: N/A;       Medication List    STOP taking these medications        amLODipine 10 MG tablet  Commonly known as:  NORVASC     celecoxib 100 MG capsule  Commonly known as:  CELEBREX     chlorzoxazone 500 MG tablet  Commonly known as:  PARAFON FORTE DSC     HYDROcodone-acetaminophen 5-325 MG tablet  Commonly known as:  NORCO     oxyCODONE-acetaminophen 5-325 MG/5ML solution  Commonly known as:  ROXICET     SUMAtriptan 100 MG tablet  Commonly known as:  IMITREX     valACYclovir 500 MG tablet  Commonly known as:  VALTREX      TAKE these medications        aspirin 325 MG tablet  Take 1 tablet (325 mg total) by mouth daily.     cloNIDine 0.1 MG tablet  Commonly known as:  CATAPRES  TAKE ONE TABLET BY MOUTH TWICE DAILY     pravastatin 80 MG tablet  Commonly known as:  PRAVACHOL  Take 1 tablet (80 mg total) by mouth daily at 6 PM.        LABORATORY STUDIES CBC    Component Value Date/Time   WBC 10.4 10/02/2015 2237   RBC 4.54 10/02/2015 2237   HGB 16.3* 10/02/2015 2303   HCT 48.0* 10/02/2015 2303   PLT 266 10/02/2015 2237   MCV 88.8 10/02/2015 2237   MCH 30.4 10/02/2015 2237   MCHC 34.2 10/02/2015 2237   RDW  16.0* 10/02/2015 2237   LYMPHSABS 4.9* 10/02/2015 2237   MONOABS 0.7 10/02/2015 2237   EOSABS 0.2 10/02/2015 2237   BASOSABS 0.0 10/02/2015 2237   CMP    Component Value Date/Time   NA 141 10/02/2015 2303   K 3.7 10/02/2015 2303   CL 105 10/02/2015 2303   CO2 22 10/02/2015 2237   GLUCOSE 106* 10/02/2015 2303   BUN 17 10/02/2015 2303   CREATININE 0.70 10/02/2015 2303   CREATININE 0.65 09/20/2014 1529   CALCIUM 9.2 10/02/2015 2237   PROT 7.5 10/02/2015 2237   ALBUMIN 3.8 10/02/2015 2237   AST 17 10/02/2015 2237   ALT 9* 10/02/2015 2237   ALKPHOS 98 10/02/2015 2237   BILITOT 0.7 10/02/2015 2237   GFRNONAA >60 10/02/2015 2237   GFRNONAA >89 06/25/2013 1411   GFRAA >60 10/02/2015 2237   GFRAA >89 06/25/2013 1411   COAGS Lab Results  Component Value  Date   INR 0.97 10/02/2015   Lipid Panel    Component Value Date/Time   CHOL 241* 10/03/2015 0400   TRIG 84 10/03/2015 0400   HDL 43 10/03/2015 0400   CHOLHDL 5.6 10/03/2015 0400   VLDL 17 10/03/2015 0400   LDLCALC 181* 10/03/2015 0400   HgbA1C  Lab Results  Component Value Date   HGBA1C 5.9* 10/03/2015    SIGNIFICANT DIAGNOSTIC STUDIES I have personally reviewed the radiological images below and agree with the radiology interpretations.  Ct Head Wo Contrast 10/03/2015 No abnormal intracranial enhancement.  10/02/2015 No acute intracranial hemorrhage. Slight asymmetric white matter prominence of the right centrum semiovale most likely related to patient positioning. Slight focal prominence of the basilar tip. CT angiography is recommended for further evaluation.   CTA NECK 10/03/2015 No hemodynamically significant stenosis or acute vascular process.   CTA HEAD 10/03/2015 No acute large vessel occlusion or high-grade stenosis. Dolicoectatic intracranial vessels most commonly seen with chronic hypertension, less likely HIV.   MRI  10/04/2015 1. Patchy small volume acute ischemic cortical infarcts involving the right parietal lobe. No associated hemorrhage or mass effect. 2. Remote lacunar infarcts involving the right basal ganglia/corona radiata. 3. Mild age-related cerebral atrophy with chronic small vessel ischemic disease.  2D echo  - Left ventricle: The cavity size was normal. Wall thickness wasnormal. The estimated ejection fraction was 55%. Wall motion wasnormal; there were no regional wall motion abnormalities. Dopplerparameters are consistent with abnormal left ventricularrelaxation (grade 1 diastolic dysfunction). - Aortic valve: There was no stenosis. - Mitral valve: There appeared to be a loose chord attached to themitral valve but there was no significant mitral regurgitation. - Right ventricle: The cavity size was normal. Systolic functionwas normal. -  Tricuspid valve: Peak RV-RA gradient (S): 21 mm Hg. - Pulmonary arteries: PA peak pressure: 24 mm Hg (S). - Inferior vena cava: The vessel was normal in size. Therespirophasic diameter changes were in the normal range (>= 50%),consistent with normal central venous pressure. - Pericardium, extracardiac: There was no pericardial effusion. Impressions: Normal LV size with EF 55%. Normal RV size and systolic function.No significant valvular abnormalities.  LE venous doppler No evidence of DVT, superficial thrombosis, or Baker's Cyst.  TEE  Left Ventrical: Mild - moderate LV dysfunction , EF ~ 40% Mitral Valve: trace MR  Aortic Valve: normal AV  Tricuspid Valve: normal TV Pulmonic Valve: normal  Left Atrium/ Left atrial appendage: no thrombi Atrial septum: intact by color flow and Bubble contrast , no evidence of ASD or PFO  Aorta: mild atherosclerosis  HISTORY OF PRESENT ILLNESS Alicia Bolton is an 59 y.o. female with a past medical history significant for HTN, smoking, migraines, s/p chest mass surgery 7/16, s/p cervical fusion, brought in by EMS as a code stroke due to acute onset of left hemiparesis. Patient lives at home and said that for unclear reason she lost bladder control while in bed, got up to go to the bathroom and almost fell because the left leg was significantly weak and she had to be help to get up from the floor. Then, she realized that her left arm was also very weak. EMS was summoned and documented the presence of left hemiparesis. Upon arrival to the ED she had initial NIHSS 8 with subsequent improvement mainly in her left arm after returning from the CT suite. CT brain showed no acute abnormality. Available labs also reviewed: platelet count 266, INR 0.97, PTT 28. Presently, she denies HA, vertigo, double vision, slurred speech, language or vision impairment. She was last known well 10/02/15 at 2125. NIHSS: 8. MRS: 0. Patient was administered TPA 10/01/2105 at  2307. She was admitted to the neuro ICU for further evaluation and treatment.    HOSPITAL COURSE Ms. SYMPHONY CRIADO is a 59 y.o. female with history of HTN, smoking, migraines, s/p chest mass surgery 7/16, s/p cervical fusion presenting with left hemiparesis. Patient was administered TPA 10/01/2105 at 2307.   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   LE venous doppler ruled out DVT  hypercoagulable tests pending.   LDL 181  HgbA1c 5.9  No antithrombotic prior to admission, started on aspirin 325 mg daily.   Ongoing aggressive stroke risk factor management  Therapy recommendations: HHPT  Disposition: Return home   Palpitation  Pt complains palpitation since her chest sugery  Resume home clonidine  implantable loop recorder to evaluate for atrial fibrillation as etiology of stroke.   Hypertension  Home meds - clonidine and amlodipine  Stable  Continue clonidine  Discontinue amlodipine as BP in control with clonidine  Hyperlipidemia  Home meds: No statin   LDL 181, goal < 70  Added lipitor 40mg    Switch to pravastatin on discharge due to financial reason  Tobacco abuse  Current smoker  Smoking cessation counseling provided  Pt is willing to quit  Other Stroke Risk Factors  ETOH use  Migraines   DISCHARGE EXAM Blood pressure 114/81, pulse 97, temperature 98.4 F (36.9 C), temperature source Oral, resp. rate 20, height 5\' 6"  (1.676 m), weight 142 lb (64.411 kg), SpO2 100 %. General - Well nourished, well developed, in no apparent distress.  Ophthalmologic - Sharp disc margins OU.   Cardiovascular - Regular rate and rhythm with no murmur.  Mental Status -  Level of arousal and  orientation to time, place, and person were intact. Language including expression, naming, repetition, comprehension was assessed and found intact. Fund of Knowledge was assessed and was intact.  Cranial Nerves II - XII - II - Visual field intact OU. III, IV, VI - Extraocular movements intact. V - Facial sensation intact bilaterally. VII - Facial movement intact bilaterally. VIII - Hearing & vestibular intact bilaterally. X - Palate elevates symmetrically. XI - Chin turning & shoulder shrug intact bilaterally. XII - Tongue protrusion intact.  Motor Strength - The patient's strength was normal in all extremities and pronator drift was absent. Bulk was normal and fasciculations were absent.  Motor Tone - Muscle tone was assessed at the neck and appendages and was normal.  Reflexes - The patient's reflexes were 1+ in all extremities and she had no pathological reflexes.  Sensory - Light touch, temperature/pinprick were assessed and were symmetrical.   Coordination - The patient had normal movements in the hands and feet with no ataxia or dysmetria. Tremor was absent.  Gait and Station - not tested due to safety concerns.   Discharge Diet   Diet Heart Room service appropriate?: Yes; Fluid consistency:: Thin liquids  DISCHARGE PLAN  Disposition:  home   aspirin 325 mg daily for secondary stroke prevention.  Follow-up Vic Blackbird, MD in 2 weeks.  Follow-up with Dr. Rosalin Hawking, Stroke Clinic in 2 months.  35 minutes were spent preparing discharge.  Rosalin Hawking, MD PhD Stroke Neurology 10/06/2015 5:06 PM

## 2015-10-06 NOTE — Care Management Important Message (Signed)
Important Message  Patient Details  Name: Alicia Bolton MRN: [id number] Date of Birth: August 18, 1956   Medicare Important Message Given:  Yes    Gabryel Talamo P Rehan Holness 10/06/2015, 11:38 AM

## 2015-10-07 ENCOUNTER — Other Ambulatory Visit: Payer: Self-pay | Admitting: Neurology

## 2015-10-07 ENCOUNTER — Encounter (HOSPITAL_COMMUNITY): Payer: Self-pay | Admitting: Internal Medicine

## 2015-10-07 DIAGNOSIS — I639 Cerebral infarction, unspecified: Secondary | ICD-10-CM

## 2015-10-07 LAB — LUPUS ANTICOAGULANT PANEL
DRVVT: 68.1 s — AB (ref 0.0–44.0)
PTT Lupus Anticoagulant: 40.3 s (ref 0.0–40.6)

## 2015-10-07 LAB — SICKLE CELL SCREEN: SICKLE CELL SCREEN: NEGATIVE

## 2015-10-07 LAB — DRVVT MIX: dRVVT Mix: 38.3 s (ref 0.0–44.0)

## 2015-10-07 MED ORDER — LOVASTATIN 40 MG PO TABS: 40.0000 mg | ORAL_TABLET | Freq: Every day | ORAL | Status: DC

## 2015-10-07 MED ORDER — CLONIDINE HCL 0.1 MG PO TABS: 0.1000 mg | ORAL_TABLET | Freq: Two times a day (BID) | ORAL | Status: DC

## 2015-10-07 NOTE — Progress Notes (Signed)
10/07/15-1500--Patient called earlier in the day and stated that when she went to [facility] to pick up her meds that the Pravastatin was not on the $4 list and that her Clonidine prescription was not set to pharmacy. CM spoke with Alicia Bolton and he was going to have Dr Erlinda Hong send over new prescriptions for Alicia Bolton. Also spoke with her about her choice of [place] agencies. Patient chose [facility]. Miranda with [facility] notified and accepted the referral.

## 2015-10-08 LAB — BETA-2-GLYCOPROTEIN I ABS, IGG/M/A
Beta-2 Glyco I IgG: <9 GPI IgG units (ref 0–20)
Beta-2-Glycoprotein I IgA: <9 GPI IgA units (ref 0–25)
Beta-2-Glycoprotein I IgM: <9 GPI IgM units (ref 0–32)

## 2015-10-10 LAB — PROTHROMBIN GENE MUTATION

## 2015-10-11 ENCOUNTER — Telehealth: Payer: Self-pay | Admitting: Internal Medicine

## 2015-10-11 ENCOUNTER — Encounter: Payer: Self-pay | Admitting: Family Medicine

## 2015-10-11 ENCOUNTER — Telehealth: Payer: Self-pay | Admitting: Neurology

## 2015-10-11 ENCOUNTER — Ambulatory Visit (INDEPENDENT_AMBULATORY_CARE_PROVIDER_SITE_OTHER): Payer: Medicare Other | Admitting: Family Medicine

## 2015-10-11 VITALS — BP 118/64 | HR 70 | Temp 98.1°F | Resp 16 | Ht 65.0 in | Wt 141.0 lb

## 2015-10-11 DIAGNOSIS — I639 Cerebral infarction, unspecified: Secondary | ICD-10-CM

## 2015-10-11 DIAGNOSIS — L0231 Cutaneous abscess of buttock: Secondary | ICD-10-CM | POA: Diagnosis not present

## 2015-10-11 MED ORDER — SULFAMETHOXAZOLE-TRIMETHOPRIM 800-160 MG PO TABS: 1.0000 | ORAL_TABLET | Freq: Two times a day (BID) | ORAL | Status: DC

## 2015-10-11 MED ORDER — HYDROCODONE-ACETAMINOPHEN 5-325 MG PO TABS: 1.0000 | ORAL_TABLET | Freq: Four times a day (QID) | ORAL | Status: DC | PRN

## 2015-10-11 MED ORDER — CLONIDINE HCL 0.1 MG PO TABS: 0.1000 mg | ORAL_TABLET | Freq: Two times a day (BID) | ORAL | Status: DC

## 2015-10-11 NOTE — Patient Instructions (Signed)
Take antibiotics as prescribed Use pain meds as needed Continue current medications F/U 4 months

## 2015-10-11 NOTE — Telephone Encounter (Signed)
Returned pt's phone call. Pt made aware no episodes were noted on today's transmission. Pt relieved and reports that the pain has resolved.

## 2015-10-11 NOTE — Telephone Encounter (Signed)
Attempted to call patient - no answer - left message that she should proceed to the ED for stroke-like symptoms or immediately call PCP for him to determine the treatment needed.  We are unable to further advise without ever having seen her here as a patient.

## 2015-10-11 NOTE — Assessment & Plan Note (Addendum)
On statin, BP good control,ASA Loop recorder in place  I think pain today in neck was more from arthritis, previous neck surgery, no new neurological deficits

## 2015-10-11 NOTE — Telephone Encounter (Signed)
New message     On Sunday-Monday, pt had "muscle" pain in her neck with a dull headache.  She want to know if something showed up on her loop recorder.  Please call

## 2015-10-11 NOTE — Progress Notes (Signed)
Patient ID: Alicia Bolton, female   DOB: Oct 09, 1956, 59 y.o.   MRN: [id number]   Subjective:    Patient ID: Alicia Bolton, female    DOB: 07-23-1956, 59 y.o.   MRN: [id number]  Patient presents for Hospital F/U and Abscess  patient here for hospital follow-up. She was admitted secondary to embolic stroke of unknown cause. This resulted in left-sided weakness. She is currently walking with a cane. She states initially before the TPA she cannot move the left side at all. She has a loop recorder in and she has follow-up with cardiology. She's currently on aspirin and clonidine. She is here today because of a large abscess that has come up on her buttocks. She has had boils in the past. This was been present for the past few days she's been trying Epsom salt soaks with no improvement. She's not had any fever.  I reviewed her chart and noticed a phone call to neurology this morning she states that she was having some pain on the right side of her neck which she has had multiple neck surgeries she did not have any change in mentation or speech she can feel an actual pressure point. She was just worried and called neurology did advise her to have me evaluate this or to go to the ER.    Review Of Systems:  GEN- denies fatigue, fever, weight loss,weakness, recent illness HEENT- denies eye drainage, change in vision, nasal discharge, CVS- denies chest pain, palpitations RESP- denies SOB, cough, wheeze ABD- denies N/V, change in stools, abd pain GU- denies dysuria, hematuria, dribbling, incontinence MSK- + joint pain, muscle aches, injury Neuro- denies headache, dizziness, syncope, seizure activity       Objective:    BP 118/64 mmHg  Pulse 70  Temp(Src) 98.1 F (36.7 C) (Oral)  Resp 16  Ht 5\' 5"  (1.651 m)  Wt 141 lb (63.957 kg)  BMI 23.46 kg/m2 GEN- NAD, alert and oriented x3 HEENT- PERRL, EOMI, non injected sclera, pink conjunctiva, MMM, oropharynx clear Neck- Supple, fair  ROM - no  spasm, NT  CVS- RRR, no murmur RESP-CTAB NEuro- CNII-XII intact, left side weakness, walks with cane Skin- abscess of right gluteal cleft, +erythema, TTP  EXT- No edema Pulses- Radial, DP- 2+  Procedure- Incision and Drainage Procedure explained to patient questions answered benefits and risks discussed verbal consent obtained. Antiseptic-Betadine Anesthesia-lidocaine 1% Incision performed moderate amount of pus expressed Culture taken Minimal blood loss Patient tolerated procedure well Bandage applied        Assessment & Plan:      Problem List Items Addressed This Visit    Stroke with cerebral ischemia ([place])    On statin, BP good control,ASA Loop recorder in place  I think pain today in neck was more from arthritis, previous neck surgery, no new neurological deficits      Relevant Medications   cloNIDine (CATAPRES) 0.1 MG tablet    Other Visit Diagnoses    Abscess of buttock, right    -  Primary    S/P I and D , culture taken, Start Bactrim, given Norco    Relevant Orders    Wound culture       Note: This dictation was prepared with Dragon dictation along with smaller phrase technology. Any transcriptional errors that result from this process are unintentional.

## 2015-10-11 NOTE — Telephone Encounter (Signed)
Patient called, new patient to our office, New Patient stroke follow up 12/12/14, called to advise that muscle behind ear into skull down to shoulder feeling weird, right eye feeling weird with slight headache. Sees PCP today. Patient advised that she is not officially our patient until the 1st new patient visit, patient advised to call the floor she was on at the hospital and ask for a coordinator and let them advise her how to proceed.

## 2015-10-14 LAB — WOUND CULTURE
GRAM STAIN: NONE SEEN
Gram Stain: NONE SEEN

## 2015-10-17 ENCOUNTER — Encounter: Payer: Self-pay | Admitting: Internal Medicine

## 2015-10-17 ENCOUNTER — Ambulatory Visit (INDEPENDENT_AMBULATORY_CARE_PROVIDER_SITE_OTHER): Payer: Self-pay | Admitting: *Deleted

## 2015-10-17 ENCOUNTER — Telehealth: Payer: Self-pay

## 2015-10-17 ENCOUNTER — Telehealth: Payer: Self-pay | Admitting: Family Medicine

## 2015-10-17 DIAGNOSIS — I639 Cerebral infarction, unspecified: Secondary | ICD-10-CM

## 2015-10-17 LAB — CUP PACEART INCLINIC DEVICE CHECK: Date Time Interrogation Session: 20161219142133

## 2015-10-17 NOTE — Progress Notes (Signed)
Wound check appointment. Steri-strips removed prior to arrival. Wound without redness or edema. Incision edges approximated, wound well healed. Normal device function. Pt with 0 tachy episodes; 0 brady episodes; 0 asystole. Monthly summary reports & ROV w/ JA PRN.

## 2015-10-17 NOTE — Telephone Encounter (Signed)
Patient would like handicap placard if possible  [phone]

## 2015-10-17 NOTE — Telephone Encounter (Signed)
I spoke to the patient to reschedule her Respect-ESUS screening visit for [id number] at 09:30 instead if [id number] at 10:30h. Patient agreed and expressed gratitude.

## 2015-10-17 NOTE — Telephone Encounter (Signed)
I spoke to the patient in regards to the Respect-ESUS study. Patient is interested in participating. Therefore, a screening visit was scheduled for Tuesday, [id number] at 10:30h.

## 2015-10-18 NOTE — Telephone Encounter (Signed)
Okay, give 6 month, Dx Stroke

## 2015-10-18 NOTE — Telephone Encounter (Signed)
Ok to fill our form?

## 2015-10-18 NOTE — Telephone Encounter (Signed)
Form completed.  Call placed to patient and patient made aware per VM.

## 2015-11-04 ENCOUNTER — Telehealth: Payer: Self-pay | Admitting: Family Medicine

## 2015-11-04 MED ORDER — LORAZEPAM 0.5 MG PO TABS: 0.5000 mg | ORAL_TABLET | Freq: Two times a day (BID) | ORAL | Status: DC | PRN

## 2015-11-04 NOTE — Telephone Encounter (Signed)
[facility] [place]  Patient states her and Dr. Buelah Manis had discussed maybe calling in something for her anxiety she states she has had a few more panic attacks.

## 2015-11-04 NOTE — Telephone Encounter (Signed)
Call placed to patient.   States that she is having increased bouts of crying and feeling of being overwhelmed.   States that she would prefer to have something PRN instead of daily that she can take as needed as episodes occur.   Requests MD consult [facility]'s $4 list for lower cost medications.

## 2015-11-04 NOTE — Telephone Encounter (Signed)
Medication called to pharmacy.  Prescription is $8.70 cash price.   Call placed to patient and patient made aware.

## 2015-11-04 NOTE — Telephone Encounter (Signed)
Give Ativan 0.5mg  BID prn #30 R 1

## 2015-11-07 ENCOUNTER — Ambulatory Visit (INDEPENDENT_AMBULATORY_CARE_PROVIDER_SITE_OTHER): Payer: Self-pay | Admitting: *Deleted

## 2015-11-07 DIAGNOSIS — I639 Cerebral infarction, unspecified: Secondary | ICD-10-CM

## 2015-11-08 NOTE — Progress Notes (Signed)
Cardiology Office Note Date:  11/09/2015  Patient ID:  Alicia Bolton, Alicia Bolton 1955-12-18, MRN [id number] PCP:  Vic Blackbird, MD  Electrophysiologist: Dr. Rayann Heman (ILR implant) Neurologist: Dr. Erlinda Hong   Chief Complaint: s/p loop implant 10/06/15, comes in today because she had an appointment  History of Present Illness: Alicia Bolton is a 60 y.o. female with history of cryptogenic stroke 10/05/15 with loop implant 10/06/15, HTN, migraines, on 05/08/2015, (Care everywhere states s/p hemisternotomy, mediastinal mass resection, and thymectomy) and hx of s/p cervical fusion.  She comes in today feeling well, though continues to use a cane for some balance difficulties post CVA.  She mentions some anxiety that she has since her stroke managing with her PMD.   She c/w neurology as well.  She denies any palpitations or SOB, no CP, no near syncope or syncope.    Past Medical History  Diagnosis Date  . Hypertension   . Migraines   . Smoker   . Neuromuscular disorder ([place])   . PONV (postoperative nausea and vomiting)   . Tear of medial meniscus of right knee 10/09/2013  . Arthritis   . Stroke [facility]) [id number]    Embolic Right ACA, loop records    Past Surgical History  Procedure Laterality Date  . Cervical fusion  2007  . Cesarean section  1985  . Abdominal hysterectomy  1997  . Cholecystectomy  1998  . Knee arthroscopy Right 1999  . Colonoscopy    . Knee arthroscopy with medial menisectomy Right 10/09/2013    Procedure: RIGHT KNEE ARTHROSCOPY WITH MEDIAL MENISECTOMY;  Surgeon: Johnny Bridge, MD;  Location: [place];  Service: Orthopedics;  Laterality: Right;  clean  . Anterior cervical decomp/discectomy fusion N/A 10/25/2014    Procedure: CERVICAL THREE-FOUR, CERVICAL FOUR FIVE ANTERIOR CERVICAL DECOMPRESSION/DISCECTOMY FUSION 2 LEVEL/HARDWARE REMOVALCERVICAL FIVE-SEVEN;  Surgeon: Elaina Hoops, MD;  Location: [place] NEURO ORS;  Service: Neurosurgery;  Laterality: N/A;  . Carpal  tunnel release Left 10/25/2014    Procedure: CARPAL TUNNEL RELEASE;  Surgeon: Elaina Hoops, MD;  Location: [place] NEURO ORS;  Service: Neurosurgery;  Laterality: Left;  . Tee without cardioversion N/A 10/05/2015    Procedure: TRANSESOPHAGEAL ECHOCARDIOGRAM (TEE);  Surgeon: Thayer Headings, MD;  Location: [place];  Service: Cardiovascular;  Laterality: N/A;  . Ep implantable device N/A 10/06/2015    Procedure: Loop Recorder Insertion;  Surgeon: Thompson Grayer, MD;  Location: [place] CV LAB;  Service: Cardiovascular;  Laterality: N/A;  . Chest surgery  04/2015    Benign chest mass removed- [facility]     Current Outpatient Prescriptions  Medication Sig Dispense Refill  . aspirin 325 MG tablet Take 1 tablet (325 mg total) by mouth daily. 30 tablet 2  . cloNIDine (CATAPRES) 0.1 MG tablet Take 1 tablet (0.1 mg total) by mouth 2 (two) times daily. 60 tablet 5  . HYDROcodone-acetaminophen (NORCO) 5-325 MG tablet Take 1 tablet by mouth every 6 (six) hours as needed for moderate pain. 45 tablet 0  . LORazepam (ATIVAN) 0.5 MG tablet Take 1 tablet (0.5 mg total) by mouth 2 (two) times daily as needed for anxiety. 30 tablet 1  . lovastatin (MEVACOR) 40 MG tablet Take 1 tablet (40 mg total) by mouth at bedtime. 30 tablet 5  . sulfamethoxazole-trimethoprim (BACTRIM DS,SEPTRA DS) 800-160 MG tablet Take 1 tablet by mouth 2 (two) times daily. 14 tablet 0   No current facility-administered medications for this visit.    Allergies:   Floxin;  Oxycodone; and Penicillins   Social History:  The patient  reports that she quit smoking about 2 years ago. Her smoking use included Cigarettes. She has never used smokeless tobacco. She reports that she drinks alcohol. She reports that she does not use illicit drugs.   Family History:  The patient's family history includes Cancer in her maternal grandmother; Diabetes in her mother; Hypertension in her mother.  ROS:  Please see the history of present illness.    PHYSICAL EXAM:  VS:  BP 118/84 mmHg  Pulse 91  Ht 5\' 6"  (1.676 m)  Wt 143 lb (64.864 kg)  BMI 23.09 kg/m2 BMI: Body mass index is 23.09 kg/(m^2). Well nourished, well developed, in no acute distress HEENT: normocephalic, atraumatic Neck: no JVD, carotid bruits or masses Cardiac:  normal S1, S2; RRR; no significant murmurs, no rubs, or gallops Lungs:  clear to auscultation bilaterally, no wheezing, rhonchi or rales Abd: soft, nontender MS: no deformity or atrophy Ext: no edema Skin: warm and dry, no rash Neuro:  No gross deficits appreciated Psych: euthymic mood, full affect  loop implant site is stable (inferior to left breast)  EKG:  Done today shows SR ILR interrogation today shows no AF, no events of any kind  10/05/15 TEE Study Conclusions - Left ventricle: The cavity size was normal. Wall thickness was normal. Systolic function was normal. The estimated ejection fraction was in the range of 50% to 55%. - Aortic valve: No evidence of vegetation. - Mitral valve: There was mild regurgitation. - Left atrium: No evidence of thrombus in the atrial cavity or appendage. - Atrial septum: No defect or patent foramen ovale was identified. - Tricuspid valve: No evidence of vegetation.  10/03/15 Echocardiogram: Study Conclusions Left ventricle: The cavity size was normal. Wall thickness was normal. The estimated ejection fraction was 55%. Wall motion was normal; there were no regional wall motion abnormalities. Doppler parameters are consistent with abnormal left ventricular relaxation (grade 1 diastolic dysfunction). - Aortic valve: There was no stenosis. - Mitral valve: There appeared to be a loose chord attached to the mitral valve but there was no significant mitral regurgitation. - Right ventricle: The cavity size was normal. Systolic function was normal. - Tricuspid valve: Peak RV-RA gradient (S): 21 mm Hg. - Pulmonary arteries: PA peak pressure: 24  mm Hg (S). - Inferior vena cava: The vessel was normal in size. The respirophasic diameter changes were in the normal range (>= 50%), consistent with normal central venous pressure. - Pericardium, extracardiac: There was no pericardial effusion. Impressions: - Normal LV size with EF 55%. Normal RV size and systolic function. No significant valvular abnormalities.  Recent Labs: 03/22/2015: TSH 1.165 10/02/2015: ALT 9*; BUN 17; Creatinine, Ser 0.70; Hemoglobin 16.3*; Platelets 266; Potassium 3.7; Sodium 141  10/03/2015: Cholesterol 241*; HDL 43; LDL Cholesterol 181*; Total CHOL/HDL Ratio 5.6; Triglycerides 84; VLDL 17   CrCl cannot be calculated (Patient has no serum creatinine result on file.).   Wt Readings from Last 3 Encounters:  11/09/15 143 lb (64.864 kg)  10/11/15 141 lb (63.957 kg)  10/05/15 142 lb (64.411 kg)     Other studies reviewed: Additional studies/records reviewed today include: summarized above  DEVICE information: MDT ILR for cryptogenic stroke  ASSESSMENT AND PLAN:  1. ILR implanted Dec 2016 for cryptogenic stroke     Site is well healed     No events of any kind noted as of today  2. HTN     Appears well controlled  Disposition:  F/u with in-clinic EP/ILR visit in 1 year, sooner if needed.  She is instructed to notify us of any symptoms as well.  Current medicines are reviewed at length with the patient today.  The patient did not have any concerns regarding medicines.  Haywood Lasso, PA-C 11/09/2015 11:23 AM     [facility] [address] [place] [address] [phone] (office)  [phone] (fax)

## 2015-11-09 ENCOUNTER — Encounter: Payer: Self-pay | Admitting: Physician Assistant

## 2015-11-09 ENCOUNTER — Ambulatory Visit (INDEPENDENT_AMBULATORY_CARE_PROVIDER_SITE_OTHER): Payer: Self-pay | Admitting: Physician Assistant

## 2015-11-09 VITALS — BP 118/84 | HR 91 | Ht 66.0 in | Wt 143.0 lb

## 2015-11-09 DIAGNOSIS — I639 Cerebral infarction, unspecified: Secondary | ICD-10-CM

## 2015-11-09 NOTE — Patient Instructions (Signed)
Medication Instructions:   CONTINUE SAM MEDICATIONS  If you need a refill on your cardiac medications before your next appointment, please call your pharmacy.  Labwork: NONE ORDER TODAY     Testing/Procedures: NONE ORDER TODAY   Follow-Up:  Your physician wants you to follow-up in: [place] will  receive a reminder letter in the mail two months in advance. If you don't receive a letter, please call our office to schedule the follow-up appointment.     Any Other Special Instructions Will Be Listed Below (If Applicable).

## 2015-11-11 ENCOUNTER — Encounter: Payer: Self-pay | Admitting: Internal Medicine

## 2015-11-14 NOTE — Progress Notes (Signed)
Carelink Summary Report / Loop Recorder 

## 2015-11-15 ENCOUNTER — Telehealth: Payer: Self-pay | Admitting: Neurology

## 2015-11-15 ENCOUNTER — Encounter: Payer: Medicare Other | Admitting: Diagnostic Neuroimaging

## 2015-11-15 NOTE — Telephone Encounter (Signed)
I left a message for Alicia Bolton. [place].  I told her that I received a message from one of my associates that she had called to relay that she is ill and would like to cancel her research appointment.  I told her that I cancelled her appointment and that whenever she was ready, to call me back to reschedule her appointment.  I left my direct phone number so that she could call me back to reschedule.

## 2015-11-15 NOTE — Telephone Encounter (Signed)
I spoke with Fermin Schwab.  She was very ill.  I apologized for calling her but asked if she was still interested in the Respect-ESUS study.  She stated that she was and that she would call me back tomorrow to reschedule her research appointment.  I told her to get some rest and that I looked forward to speaking with her tomorrow.

## 2015-11-15 NOTE — Telephone Encounter (Signed)
Hi, Alicia Bolton:  Please follow this up if she is interested in the RESPECT ESUS trial. If she does not call back, she has appointment with Dr. Krista Blue on 12/13/15. Please let Dr. Krista Blue know and set up appointment for screening and randomization at that time. Her stroke was on 10/02/15 and we only have 3 months window. Thanks.  Alicia Hawking, MD PhD Stroke Neurology 11/15/2015 11:20 AM

## 2015-11-17 ENCOUNTER — Telehealth: Payer: Self-pay | Admitting: Neurology

## 2015-11-17 NOTE — Telephone Encounter (Signed)
Thanks much, [place].   Rosalin Hawking, MD PhD Stroke Neurology 11/17/2015 9:29 PM

## 2015-11-17 NOTE — Telephone Encounter (Signed)
I spoke with Alicia Bolton.  I called to reschedule her Screening Visit appointment for the Respect-ESUS study.  [name] rescheduled her visit for Wednesday,  12/07/15 at 9:00am

## 2015-12-06 ENCOUNTER — Ambulatory Visit (INDEPENDENT_AMBULATORY_CARE_PROVIDER_SITE_OTHER): Payer: Self-pay | Admitting: *Deleted

## 2015-12-06 DIAGNOSIS — I639 Cerebral infarction, unspecified: Secondary | ICD-10-CM

## 2015-12-06 LAB — CUP PACEART REMOTE DEVICE CHECK: Date Time Interrogation Session: 20170206220532

## 2015-12-06 NOTE — Progress Notes (Signed)
Carelink Summary Report / Loop Recorder 

## 2015-12-07 ENCOUNTER — Encounter: Payer: Self-pay | Admitting: Neurology

## 2015-12-07 ENCOUNTER — Ambulatory Visit (INDEPENDENT_AMBULATORY_CARE_PROVIDER_SITE_OTHER): Payer: Self-pay | Admitting: Neurology

## 2015-12-07 DIAGNOSIS — I639 Cerebral infarction, unspecified: Secondary | ICD-10-CM

## 2015-12-07 NOTE — Progress Notes (Signed)
Pt was seen in research department for RESPECT ESUS screening. She has been doing well post stroke, following with cardiology, so far loop recorder no afib. Pt still has some numbness at LLE and walk with cane for balance. On exam, she is anxious, has some give-away weakness at LLE with subjective LLE numbness, and mild difficulty with gait. Able to do ADLs without assistance. mRS = 2. Pt signed consent for the trial and will do randomization in 2 weeks.   She needs outpt PT/OT but her insurance not able to cover. Will have social worker consult for her. Will cancel her appointment with Dr. Krista Blue next week, she will follow up with [place] in research visit.  Rosalin Hawking, MD PhD Stroke Neurology 12/07/2015 11:32 AM  NIH Stroke Scale  Level Of Consciousness 0=Alert; keenly responsive 1=Not alert, but arousable by minor stimulation 2=Not alert, requires repeated stimulation 3=Responds only with reflex movements 0  LOC Questions to Month and Age 11=Answers both questions correctly 1=Answers one question correctly 2=Answers neither question correctly 0  LOC Commands      -Open/Close eyes     -Open/close grip 0=Performs both tasks correctly 1=Performs one task correctly 2=Performs neighter task correctly 0  Best Gaze 0=Normal 1=Partial gaze palsy 2=Forced deviation, or total gaze paresis 0  Visual 0=No visual loss 1=Partial hemianopia 2=Complete hemianopia 3=Bilateral hemianopia (blind including cortical blindness) 0  Facial Palsy 0=Normal symmetrical movement 1=Minor paralysis (asymmetry) 2=Partial paralysis (lower face) 3=Complete paralysis (upper and lower face) 0  Motor  0=No drift, limb holds posture for full 10 seconds 1=Drift, limb holds posture, no drift to bed 2=Some antigravity effort, cannot maintain posture, drifts to bed 3=No effort against gravity, limb falls 4=No movement Right Arm 0     Leg 0    Left Arm 0     Leg 0  Limb Ataxia 0=Absent 1=Present in one limb 2=Present in  two limbs 0  Sensory 0=Normal 1=Mild to moderate sensory loss 2=Severe to total sensory loss 1  Best Language 0=No aphasia, normal 1=Mild to moderate aphasia 2=Mute, global aphasia 3=Mute, global aphasia 0  Dysarthria 0=Normal 1=Mild to moderate 2=Severe, unintelligible or mute/anarthric 0  Extinction/Neglect 0=No abnormality 1=Extinction to bilateral simultaneous stimulation 2=Profound neglect 0  Total   1    Orders Placed This Encounter  Procedures  . Ambulatory referral to Social Work    Referral Priority:  Routine    Referral Type:  Consultation    Referral Reason:  Specialty Services Required    Number of Visits Requested:  1

## 2015-12-08 ENCOUNTER — Telehealth: Payer: Self-pay | Admitting: Neurology

## 2015-12-08 DIAGNOSIS — I10 Essential (primary) hypertension: Secondary | ICD-10-CM | POA: Insufficient documentation

## 2015-12-08 MED ORDER — CLONIDINE HCL 0.2 MG PO TABS: 0.2000 mg | ORAL_TABLET | Freq: Two times a day (BID) | ORAL | Status: DC

## 2015-12-08 NOTE — Telephone Encounter (Signed)
I spoke with Derek Jack. Mcdaniel.  I explained that my reason for calling was to ask for the name of her primary care provider (as requested by my manager).  She stated that she goes to [facility] and that her primary provider is [facility].  I told her thank you and that I hope she was able to rest easy.  She said "thank you" and appreciated my call.

## 2015-12-08 NOTE — Telephone Encounter (Signed)
I received a phone call from [name].  She stated that there was some paperwork that she needed from Dr Erlinda Hong.  This paperwork is in concerns to her health care.  She said that she needed the paperwork (and or recommendation) to obtain a social woker as well as for physical therapy. This is for her clinical health and not about her research visit.  Also, Alicia Bolton stated that she is concerned about her BP.  She stated that it keeps rising.  Yesterday evening (at 7pm), she took her BP reading and it was 150/100.  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  She stated this is with taking her blood pressure pills.  The patient is worried about her blood pressure and requested some advice from Dr. Erlinda Hong.  I told her that I would send a message to Dr. Erlinda Hong and tell him to contact her.

## 2015-12-08 NOTE — Telephone Encounter (Signed)
Hi, Jacky:  I already ordered Education officer, museum consult for her, they will give her a call. She told me during the visit that she only had Medicare part a which not covering for her PT, she is going to work with insurance about PT coverage.  For BP, she has anxiety which can trigger her blood pressure high. However, she has clonidine 0.1 mg twice a day, which I think we should increase 2.2 mg twice a day. I really be prescribed the medication to her [place]. And she can pick up and starting to take medication to decrease her BP.  I tried to call patient, however it goes to voice mail. Livia Snellen, could you please call her and let her know? Thanks much.  Rosalin Hawking, MD PhD Stroke Neurology 12/08/2015 12:41 PM  Meds ordered this encounter  Medications  . cloNIDine (CATAPRES) 0.2 MG tablet    Sig: Take 1 tablet (0.2 mg total) by mouth 2 (two) times daily.    Dispense:  60 tablet    Refill:  2

## 2015-12-08 NOTE — Addendum Note (Signed)
Addended by: Rosalin Hawking on: 12/08/2015 12:43 PM   Modules accepted: Orders

## 2015-12-08 NOTE — Telephone Encounter (Signed)
I left a voicemail message for Alicia Bolton.  I let her know that Dr. Erlinda Hong reached out to me concerning her questions.  I stated that Dr. Erlinda Hong tried to contact her and left a message.  I told her to feel free to call him back or to call me back.  I left my direct phone number (for her to reach me).

## 2015-12-09 ENCOUNTER — Telehealth: Payer: Self-pay | Admitting: *Deleted

## 2015-12-09 NOTE — Telephone Encounter (Signed)
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  States she has been under a lot of stress.  In Epic there is a note from Dr. Erlinda Hong on 2/9 stating he increase her clonidine from 0.1 mg to 2.2 mg BID and sent to [facility].  She states no one called her to inform her that he had changed her medications.   She broke down and was crying.  States she is so stressed out with SS disability, getting insurance papers signed, and paying for medications.  Listened to her and tried to reassure her.  BP here was 150/108.  Advised to contact [facility]  and to let Dr. Phoebe Sharps office if Rx not there. She verbalizes understanding and was calmer when she left.

## 2015-12-13 ENCOUNTER — Institutional Professional Consult (permissible substitution): Payer: 59 | Admitting: Neurology

## 2015-12-14 ENCOUNTER — Telehealth: Payer: Self-pay | Admitting: Neurology

## 2015-12-14 NOTE — Telephone Encounter (Signed)
I spoke with Alicia Bolton. [name].  I reminded her of her research appointment that is scheduled for tomorrow at 9:00am.  The patient stated that her BP was still high but that she had started the medicine.  She said that she would be at her appointment.

## 2015-12-15 ENCOUNTER — Ambulatory Visit (INDEPENDENT_AMBULATORY_CARE_PROVIDER_SITE_OTHER): Payer: Self-pay | Admitting: Neurology

## 2015-12-15 DIAGNOSIS — I639 Cerebral infarction, unspecified: Secondary | ICD-10-CM

## 2015-12-15 NOTE — Progress Notes (Signed)
RESPECT ESUS STUDY RANDOMIZATION VISIT  I personally reviewed all inclusion and exclusion criteria for the study and patient meets them. She was given opportunity to ask questions which were answered. She was advised to discontinue taking aspirin on her own and take only medications provided through the study as her blood thinners. Patient had some elevated blood pressure recently and went to the fire department and medication dose was increased which has helped. She does not currently have a primary care physician and she was given referral information to be seen at the community and wellness clinic. She has chronic arthritis changes in the neck and the back as well as her post stroke deficits limit her ability to walk long distances and hence she has asked for a handicapped parking sticker which was  Provided  Antony Contras

## 2015-12-20 ENCOUNTER — Other Ambulatory Visit (HOSPITAL_COMMUNITY): Payer: Self-pay | Admitting: *Deleted

## 2015-12-20 DIAGNOSIS — N6320 Unspecified lump in the left breast, unspecified quadrant: Secondary | ICD-10-CM

## 2015-12-23 ENCOUNTER — Encounter (HOSPITAL_COMMUNITY): Payer: Self-pay

## 2015-12-23 ENCOUNTER — Ambulatory Visit (HOSPITAL_COMMUNITY)
Admission: RE | Admit: 2015-12-23 | Discharge: 2015-12-23 | Disposition: A | Discharge status: Home / Self Care | Payer: Self-pay | Source: Ambulatory Visit | Attending: Obstetrics and Gynecology | Admitting: Obstetrics and Gynecology

## 2015-12-23 VITALS — BP 120/78 | Temp 98.4°F | Ht 64.0 in | Wt 144.0 lb

## 2015-12-23 DIAGNOSIS — Z1239 Encounter for other screening for malignant neoplasm of breast: Secondary | ICD-10-CM

## 2015-12-23 DIAGNOSIS — N644 Mastodynia: Secondary | ICD-10-CM

## 2015-12-23 NOTE — Patient Instructions (Signed)
Educational materials on self breast awareness given. Explained to Alicia Bolton that she no longer needs Pap smears due to her history of a hysterectomy for benign reasons. Referred patient to the [facility] for diagnostic mammogram. Appointment scheduled for Monday, December 26, 2015 at 1410. Patient aware of appointment and will be there. DESIYAH RAPOZO verbalized understanding.  Alenah Sarria, Arvil Chaco, RN 2:37 PM

## 2015-12-23 NOTE — Progress Notes (Signed)
Complaints of left breast lump under nipple x 5 days that is tender. Patient rates pain at a 2 out of 10. Patient stated when first noticed lump rated pain at a 10 out of 10.  Pap Smear:  Pap smear not completed today. Last Pap smear was in 1997 and normal per patient. Per patient has no history of an abnormal Pap smear. Patient has a history of a hysterectomy in 1997 due to fibroids. Patient no longer needs Pap smears due to her history of a hysterectomy for benign reasons per ACOG and BCCCP guidelines. No Pap smear results in EPIC.  Physical exam: Breasts Breasts symmetrical. No skin abnormalities right breast. Scar observed center of chest and under left breast from previous surgeries. No nipple retraction bilateral breasts. No nipple discharge bilateral breasts. No lymphadenopathy. No lumps palpated bilateral breasts. No lump palpated at patients area of concern. Complaints of tenderness when palpated the center of the left breast under the nipple. Referred patient to the [facility] for diagnostic mammogram. Appointment scheduled for Monday, December 26, 2015 at 1410.  Pelvic/Bimanual No Pap smear completed today since patient has a history of a hysterectomy for benign reasons.. Pap smear not indicated per BCCCP guidelines.   Smoking History: Patient is a former smoker that quit 07/07/2014.  Patient Navigation: Patient education provided. Access to services provided for patient through [facility] program.   Colorectal Cancer Screening: Patient had a colonoscopy completed in 2013. No complaints today.

## 2015-12-26 ENCOUNTER — Ambulatory Visit
Admission: RE | Admit: 2015-12-26 | Discharge: 2015-12-26 | Disposition: A | Discharge status: Home / Self Care | Payer: No Typology Code available for payment source | Source: Ambulatory Visit | Attending: Obstetrics and Gynecology | Admitting: Obstetrics and Gynecology

## 2015-12-26 DIAGNOSIS — N6320 Unspecified lump in the left breast, unspecified quadrant: Secondary | ICD-10-CM

## 2015-12-28 LAB — CUP PACEART REMOTE DEVICE CHECK: MDC IDC SESS DTM: 20170107213514

## 2015-12-28 NOTE — Progress Notes (Signed)
Carelink summary report received. Battery status OK. Normal device function. No new symptom episodes, tachy episodes, brady, or pause episodes. No new AF episodes. Monthly summary reports and ROV/PRN 

## 2016-01-02 ENCOUNTER — Encounter (HOSPITAL_COMMUNITY): Payer: Self-pay | Admitting: *Deleted

## 2016-01-03 NOTE — Progress Notes (Signed)
Carelink summary report received. Battery status OK. Normal device function. No new symptom episodes, tachy episodes, brady, or pause episodes. No new AF episodes. Monthly summary reports and ROV/PRN 

## 2016-01-04 ENCOUNTER — Ambulatory Visit (INDEPENDENT_AMBULATORY_CARE_PROVIDER_SITE_OTHER): Payer: No Typology Code available for payment source | Admitting: *Deleted

## 2016-01-04 DIAGNOSIS — I639 Cerebral infarction, unspecified: Secondary | ICD-10-CM

## 2016-01-06 ENCOUNTER — Telehealth: Payer: Self-pay | Admitting: Family Medicine

## 2016-01-06 NOTE — Telephone Encounter (Signed)
Is concerned about BP.  Readings are fluctuating. Does not want to have another stroke.  Does not have money to come to doctor.  Really wants to speak to you.

## 2016-01-06 NOTE — Telephone Encounter (Signed)
Pt's blood pressure readings are as follows starting on wed. 3/8 and ending today, 3/10: 140/97, 140/97, 138/99, 125/95, 121/91, 134/99, 137/102, 130/95, 149/96, 130/95, 137/102, 134/99, 121/91, 138/99 She did not have the days or times of the readings.  [phone]

## 2016-01-06 NOTE — Progress Notes (Signed)
Carelink Summary Report / Loop Recorder 

## 2016-01-06 NOTE — Telephone Encounter (Signed)
Please get some blood pressure readings from her, if she can come to OV See if she has active insurance as this will change which med I may need to put her on

## 2016-01-06 NOTE — Telephone Encounter (Signed)
She has Medicare part A only.  She has no outpatient insurance. Or medication coverage.  LMTCB

## 2016-01-06 NOTE — Telephone Encounter (Signed)
Add HCTZ 12.5mg  once a day with clonidine Schedule OV for BP F/U in 2 weeks

## 2016-01-06 NOTE — Telephone Encounter (Signed)
Patient would like a call back from you regarding her blood pressure  [phone]

## 2016-01-07 LAB — CUP PACEART REMOTE DEVICE CHECK: Date Time Interrogation Session: 20170308220540

## 2016-01-07 NOTE — Progress Notes (Signed)
Carelink summary report received. Battery status OK. Normal device function. No new symptom episodes, tachy episodes, brady, or pause episodes. No new AF episodes. Monthly summary reports and ROV/PRN 

## 2016-01-09 ENCOUNTER — Encounter: Payer: Self-pay | Admitting: Family Medicine

## 2016-01-09 MED ORDER — HYDROCHLOROTHIAZIDE 12.5 MG PO CAPS: 12.5000 mg | ORAL_CAPSULE | Freq: Every day | ORAL | Status: DC

## 2016-01-09 NOTE — Telephone Encounter (Signed)
Pt returned call.  Told about HCTZ 12.5 mg.  Pt went on and on about all her other meds.  Why are they changing them?  Was on this, now that,  Why not keep her on what seemed to be working before?  She just went on and on.  As nicely as possible told pt she needed to calm down.  Being anxious can effect her BP.  She agreed, said her life is full of stress.  Wanted to know what night her provider was on call.  She wants to talk to her.  I told her I did not know call schedule.  She then said she would call every night until she got her doctor.  When I was able to get a word in, I asked does she want me to send in HCTZ, she said yes.  I told her I will relay message to provider and ask her if she will call her back.  Pt agreed to that plan.  Will  not make appt has no outpatient insurance

## 2016-01-09 NOTE — Telephone Encounter (Signed)
Call placed to patient and patient made aware.  

## 2016-01-09 NOTE — Telephone Encounter (Signed)
Please see next message.

## 2016-01-10 ENCOUNTER — Other Ambulatory Visit: Payer: Self-pay

## 2016-01-10 NOTE — Telephone Encounter (Signed)
Call placed to patient.   States that BP reading this AM is 157/107. States that she took her HTN meds, and will wait 1 hoiur after taking medications to re-check BP.   Patient will call back with reading.

## 2016-01-10 NOTE — Telephone Encounter (Signed)
Patient calling back this morning regarding her blood pressure please call her at [phone]

## 2016-01-10 NOTE — Patient Outreach (Signed)
Telephone outreach to patient to obtain mRS. Voicemail was full, unable to leave message. Will try again 01/11/16.

## 2016-01-10 NOTE — Patient Outreach (Signed)
Return call from patient's number but caller disconnected the call upon answer. Telephone outreach to return patient's call with no answer and full voicemail box. Will try additional outreach this week to obtain mRS.    Jacqulynn Cadet  [facility] Care Management Assistant

## 2016-01-10 NOTE — Telephone Encounter (Signed)
Spoke with pt, advised to continue HCTZ with the cLONIDINE She was taken off Norvasc by cardiology/surgeon during last hospitalization  She will call back Thursday with BP readings Unfortunately she only has part A?? She is going through disability process again, she can not get any help from the other agencies Advised to call community health and wellness clinic as well

## 2016-01-12 ENCOUNTER — Other Ambulatory Visit: Payer: Self-pay

## 2016-01-12 NOTE — Patient Outreach (Signed)
Third and final telephone outreach attempt for patient to obtain mRS. Left message for return call.   Alicia Bolton  [facility] Care Management Assistant

## 2016-01-16 ENCOUNTER — Telehealth: Payer: Self-pay | Admitting: Family Medicine

## 2016-01-16 MED ORDER — HYDROCHLOROTHIAZIDE 25 MG PO TABS: 25.0000 mg | ORAL_TABLET | Freq: Every day | ORAL | Status: DC

## 2016-01-16 NOTE — Telephone Encounter (Signed)
Patient would like to speak to you regarding her blood pressure and the bottom number still being high  [phone]

## 2016-01-16 NOTE — Telephone Encounter (Signed)
Call placed to patient.   Reports that she remains concerned about BP readings. States that diastolic BP remains over 90, and HR remains elevated ([id number]), but systolic readings have improved.  Reports that she is taking both clonidine and HCTZ.   BP readings are as follows:  AM  1 hr after meds 3/12-  135/95 3/13-  135/90 3/14-  132/90 3/15-  125/93 3/16-  130/93 3/17-  133/92 3/18- 138/93  129/93 3/19- 128/96  129/88 3/20- 129/94  141/93  MD please advise.

## 2016-01-16 NOTE — Telephone Encounter (Signed)
Call placed to patient and patient made aware.   Medication list updated.

## 2016-01-16 NOTE — Telephone Encounter (Signed)
Increase HCTZ to 25mg , no other changes, She can call with BP readings in 1 week

## 2016-01-23 ENCOUNTER — Telehealth: Payer: Self-pay | Admitting: Family Medicine

## 2016-01-23 ENCOUNTER — Encounter: Payer: Self-pay | Admitting: *Deleted

## 2016-01-23 NOTE — Telephone Encounter (Signed)
If this persist she needs to go to ER, her BP look good

## 2016-01-23 NOTE — Telephone Encounter (Signed)
Call placed to patient. LMTRC.  

## 2016-01-23 NOTE — Telephone Encounter (Signed)
Patient would like a call back regarding her blood pressure, and having some "wacky" things going on during the weekend, including vision issues call her at [phone]

## 2016-01-23 NOTE — Telephone Encounter (Signed)
Call placed to patient and patient made aware.   States that she received notice from [employer]. Requested note to excuse her from duty. States that she is to call on Tuesday 01/24/2016 to see if she will be required to come in.

## 2016-01-23 NOTE — Telephone Encounter (Signed)
Received return call from patient.   Reports that BP readings are as follows:  AM  p meds PM 3/25: 136/90  114/78 3/26: 125/96  114/78  122/81 3/27: 136/93  119/87  Patient also reports that she has some light sensitivity and darks spots in her vision. States that this proceeded headache, and during HA, she had loss of focus with her vision. Advised that BP are WNL, and Sx with her vision are concurrent with migraine. Pt has appt with MD on 01/25/2016 to F/U.   MD to be made aware.

## 2016-01-23 NOTE — Telephone Encounter (Signed)
Okay to write letter, indicate she had recent stroke, is undergoing medication changes

## 2016-01-24 NOTE — Telephone Encounter (Signed)
Letter completed.  Call placed to patient and patient made aware.

## 2016-01-25 ENCOUNTER — Ambulatory Visit: Payer: Medicare Other | Admitting: Family Medicine

## 2016-01-25 NOTE — Patient Outreach (Signed)
[facility]) Care Management  01/25/2016  Alicia Bolton 12-Sep-1956 [id number]   3 telephone outreach attempts were completed to obtain mRS for patient. mRS could not be obtained because messages left for the patient requesting a return phone call but no return call received.   Jacqulynn Cadet  [facility] Care Management Assistant

## 2016-02-03 ENCOUNTER — Ambulatory Visit (INDEPENDENT_AMBULATORY_CARE_PROVIDER_SITE_OTHER): Payer: Self-pay | Admitting: *Deleted

## 2016-02-03 DIAGNOSIS — I639 Cerebral infarction, unspecified: Secondary | ICD-10-CM

## 2016-02-06 NOTE — Progress Notes (Signed)
Carelink Summary Report / Loop Recorder 

## 2016-02-08 ENCOUNTER — Ambulatory Visit (INDEPENDENT_AMBULATORY_CARE_PROVIDER_SITE_OTHER): Payer: Medicare Other | Admitting: Family Medicine

## 2016-02-08 ENCOUNTER — Encounter: Payer: Self-pay | Admitting: Family Medicine

## 2016-02-08 VITALS — BP 138/84 | HR 72 | Temp 98.4°F | Resp 14 | Ht 66.0 in | Wt 141.0 lb

## 2016-02-08 DIAGNOSIS — I639 Cerebral infarction, unspecified: Secondary | ICD-10-CM | POA: Diagnosis not present

## 2016-02-08 DIAGNOSIS — G43709 Chronic migraine without aura, not intractable, without status migrainosus: Secondary | ICD-10-CM

## 2016-02-08 DIAGNOSIS — M4802 Spinal stenosis, cervical region: Secondary | ICD-10-CM | POA: Diagnosis not present

## 2016-02-08 DIAGNOSIS — H539 Unspecified visual disturbance: Secondary | ICD-10-CM

## 2016-02-08 DIAGNOSIS — I10 Essential (primary) hypertension: Secondary | ICD-10-CM

## 2016-02-08 DIAGNOSIS — K08129 Complete loss of teeth due to periodontal diseases, unspecified class: Secondary | ICD-10-CM

## 2016-02-08 DIAGNOSIS — A6000 Herpesviral infection of urogenital system, unspecified: Secondary | ICD-10-CM

## 2016-02-08 DIAGNOSIS — IMO0002 Reserved for concepts with insufficient information to code with codable children: Secondary | ICD-10-CM | POA: Insufficient documentation

## 2016-02-08 HISTORY — DX: Reserved for concepts with insufficient information to code with codable children: IMO0002

## 2016-02-08 MED ORDER — SUMATRIPTAN SUCCINATE 100 MG PO TABS: 100.0000 mg | ORAL_TABLET | ORAL | Status: DC | PRN

## 2016-02-08 MED ORDER — LOVASTATIN 40 MG PO TABS: 40.0000 mg | ORAL_TABLET | Freq: Every day | ORAL | Status: DC

## 2016-02-08 MED ORDER — HYDROCODONE-ACETAMINOPHEN 5-325 MG PO TABS: 1.0000 | ORAL_TABLET | Freq: Four times a day (QID) | ORAL | Status: DC | PRN

## 2016-02-08 MED ORDER — HYDROCHLOROTHIAZIDE 25 MG PO TABS: 25.0000 mg | ORAL_TABLET | Freq: Every day | ORAL | Status: DC

## 2016-02-08 MED ORDER — VALACYCLOVIR HCL 500 MG PO TABS: 500.0000 mg | ORAL_TABLET | Freq: Every day | ORAL | Status: DC

## 2016-02-08 NOTE — Assessment & Plan Note (Signed)
Known spinal stenosis she's had surgery now maintained on pain medicine as needed. Her last prescription lasted 4  months

## 2016-02-08 NOTE — Progress Notes (Signed)
Patient ID: Alicia Bolton, female   DOB: 1956/06/07, 60 y.o.   MRN: [id number]    Subjective:    Patient ID: Alicia Bolton, female    DOB: 04-23-1956, 60 y.o.   MRN: [id number]  Patient presents for F/U    Patient here to follow-up. We have been communicated via email as she had a lapse in her insurance. 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 She currently has a loop recorder in place. She is on a study drug for neurology with regards to her stroke. She has been having difficulties with her blood pressures at the past few weeks. I last added hydrochlorothiazide 25 mg once a day in addition to her current clonidine 0.2 mg twice a day. Review of her labs from the hospital she has significantly elevated cholesterol with an LDL 181, her A1c was mildly elevated at 5.9%. She is currently on lovastatin  Her blood pressure at home runs 100-120/70-89 the past couple weeks it has been well controlled.  He is requesting to restart some of her O medications now that she has insurance. She needs to restart her Valtrex which she has history of herpes infection and she was on prophylaxis. She would also like to restart Imitrex as needed for her migraines. She requests a refill on her pain medication hydrocodone for her chronic neck pain she has had multiple neck surgeries.  She will like referral to the eye doctor she states her vision has changed since she had the stroke she was previously being seen by [facility] eye care. She would also like referral to a dentist in [place]. She has known periodontal disease noticed that her teeth break off at the gumline and is also significant discoloration. She found a dentist in [place] and I will take her insurance   Review Of Systems:  GEN- denies fatigue, fever, weight loss,weakness, recent illness HEENT- denies eye drainage, change in vision, nasal discharge, CVS- denies chest pain,  palpitations RESP- denies SOB, cough, wheeze ABD- denies N/V, change in stools, abd pain GU- denies dysuria, hematuria, dribbling, incontinence MSK- +joint pain, muscle aches, injury Neuro- denies headache, dizziness, syncope, seizure activity       Objective:    BP 138/84 mmHg  Pulse 72  Temp(Src) 98.4 F (36.9 C) (Oral)  Resp 14  Ht 5\' 6"  (1.676 m)  Wt 141 lb (63.957 kg)  BMI 22.77 kg/m2 GEN- NAD, alert and oriented x3,walks with cane  HEENT- PERRL, EOMI, non injected sclera, pink conjunctiva, MMM, oropharynx clear,vision grossly intact, peridontal disease, chipped teeth, discoloration of gums , no abscess  Neck- Supple, no thyromegaly, no bruit CVS- RRR, no murmur RESP-CTAB EXT- No edema Pulses- Radial  2+        Assessment & Plan:      Problem List Items Addressed This Visit    Stroke with cerebral ischemia ([place])    She is currently on statin drug lovastatin and I will she will return for recheck on her levels. She needs an LDL around 70. She also has a loop recorder and being followed by cardiology and she is on a study drug for her stroke. We'll continue to control her risk factors. Her blood pressure is much improved now.      Relevant Medications   hydrochlorothiazide (HYDRODIURIL) 25 MG tablet   lovastatin (MEVACOR) 40 MG tablet   Spinal stenosis of cervical region    Known spinal stenosis she's had surgery  now maintained on pain medicine as needed. Her last prescription lasted 4  months      HTN (hypertension)    Well controlled, no change to medications      Relevant Medications   hydrochlorothiazide (HYDRODIURIL) 25 MG tablet   lovastatin (MEVACOR) 40 MG tablet   Genital herpes    Restart her prophylaxis Valtrex 500 mg once a day      Relevant Medications   valACYclovir (VALTREX) 500 MG tablet   Chronic migraine    Imitrex prn      Relevant Medications   HYDROcodone-acetaminophen (NORCO) 5-325 MG tablet   hydrochlorothiazide (HYDRODIURIL)  25 MG tablet   SUMAtriptan (IMITREX) 100 MG tablet   lovastatin (MEVACOR) 40 MG tablet    Other Visit Diagnoses    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 Katy Fitch       Note: This dictation was prepared with Dragon dictation along with smaller phrase technology. Any transcriptional errors that result from this process are unintentional.

## 2016-02-08 NOTE — Assessment & Plan Note (Signed)
Imitrex prn 

## 2016-02-08 NOTE — Assessment & Plan Note (Signed)
Restart her prophylaxis Valtrex 500 mg once a day

## 2016-02-08 NOTE — Assessment & Plan Note (Signed)
She is currently on statin drug lovastatin and I will she will return for recheck on her levels. She needs an LDL around 70. She also has a loop recorder and being followed by cardiology and she is on a study drug for her stroke. We'll continue to control her risk factors. Her blood pressure is much improved now.

## 2016-02-08 NOTE — Assessment & Plan Note (Signed)
Well controlled, no change to medications 

## 2016-02-08 NOTE — Patient Instructions (Addendum)
Referral to Eye doctor Referral to Dentist Return for your fasting labs - this week  F/U 3 months

## 2016-02-15 ENCOUNTER — Ambulatory Visit (INDEPENDENT_AMBULATORY_CARE_PROVIDER_SITE_OTHER): Payer: Medicare Other | Admitting: Family Medicine

## 2016-02-15 ENCOUNTER — Encounter: Payer: Self-pay | Admitting: Family Medicine

## 2016-02-15 ENCOUNTER — Other Ambulatory Visit: Payer: Medicare Other

## 2016-02-15 VITALS — BP 122/78 | HR 80 | Temp 98.2°F | Resp 14 | Ht 66.0 in | Wt 144.0 lb

## 2016-02-15 DIAGNOSIS — I10 Essential (primary) hypertension: Secondary | ICD-10-CM | POA: Diagnosis not present

## 2016-02-15 DIAGNOSIS — I6350 Cerebral infarction due to unspecified occlusion or stenosis of unspecified cerebral artery: Secondary | ICD-10-CM | POA: Diagnosis not present

## 2016-02-15 DIAGNOSIS — I639 Cerebral infarction, unspecified: Secondary | ICD-10-CM

## 2016-02-15 DIAGNOSIS — L0231 Cutaneous abscess of buttock: Secondary | ICD-10-CM | POA: Diagnosis not present

## 2016-02-15 LAB — COMPREHENSIVE METABOLIC PANEL
ALT: 7 U/L (ref 6–29)
AST: 14 U/L (ref 10–35)
Albumin: 3.8 g/dL (ref 3.6–5.1)
Alkaline Phosphatase: 85 U/L (ref 33–130)
BUN: 13 mg/dL (ref 7–25)
CHLORIDE: 101 mmol/L (ref 98–110)
CO2: 29 mmol/L (ref 20–31)
CREATININE: 0.89 mg/dL (ref 0.50–1.05)
Calcium: 9.3 mg/dL (ref 8.6–10.4)
GLUCOSE: 91 mg/dL (ref 70–99)
POTASSIUM: 3.9 mmol/L (ref 3.5–5.3)
SODIUM: 141 mmol/L (ref 135–146)
Total Bilirubin: 0.6 mg/dL (ref 0.2–1.2)
Total Protein: 7.2 g/dL (ref 6.1–8.1)

## 2016-02-15 LAB — CBC WITH DIFFERENTIAL/PLATELET
BASOS ABS: 0 {cells}/uL (ref 0–200)
BASOS PCT: 0 %
EOS ABS: 190 {cells}/uL (ref 15–500)
EOS PCT: 2 %
HCT: 41.2 % (ref 35.0–45.0)
Hemoglobin: 13.7 g/dL (ref 12.0–15.0)
LYMPHS PCT: 48 %
Lymphs Abs: 4560 cells/uL — ABNORMAL HIGH (ref 850–3900)
MCH: 31.2 pg (ref 27.0–33.0)
MCHC: 33.3 g/dL (ref 32.0–36.0)
MCV: 93.8 fL (ref 80.0–100.0)
MONOS PCT: 6 %
MPV: 8.7 fL (ref 7.5–12.5)
Monocytes Absolute: 570 cells/uL (ref 200–950)
NEUTROS ABS: 4180 {cells}/uL (ref 1500–7800)
Neutrophils Relative %: 44 %
PLATELETS: 334 10*3/uL (ref 140–400)
RBC: 4.39 MIL/uL (ref 3.80–5.10)
RDW: 13.9 % (ref 11.0–15.0)
WBC: 9.5 10*3/uL (ref 3.8–10.8)

## 2016-02-15 LAB — LIPID PANEL
CHOL/HDL RATIO: 5.6 ratio — AB (ref <=5.0)
Cholesterol: 250 mg/dL — ABNORMAL HIGH (ref 125–200)
HDL: 45 mg/dL — ABNORMAL LOW (ref >=46)
LDL CALC: 176 mg/dL — AB (ref <130)
Triglycerides: 145 mg/dL (ref <150)
VLDL: 29 mg/dL (ref <30)

## 2016-02-15 MED ORDER — SULFAMETHOXAZOLE-TRIMETHOPRIM 800-160 MG PO TABS: 1.0000 | ORAL_TABLET | Freq: Two times a day (BID) | ORAL | Status: DC

## 2016-02-15 NOTE — Progress Notes (Signed)
Patient ID: Alicia Bolton, female   DOB: 10/06/1956, 60 y.o.   MRN: [id number]     Subjective:    Patient ID: Alicia Bolton, female    DOB: 1956/04/07, 60 y.o.   MRN: [id number]  Patient presents for Acscess of L Buttock  Patient here with abscess of her left buttocks. Has been present for the past 2 days. She has history of recurrent boils. She's not had any fever no systemic symptoms. She did try sitz bath with Epsom salt.    Review Of Systems:  GEN- denies fatigue, fever, weight loss,weakness, recent illness HEENT- denies eye drainage, change in vision, nasal discharge, CVS- denies chest pain, palpitations RESP- denies SOB, cough, wheeze Neuro- denies headache, dizziness, syncope, seizure activity       Objective:    BP 122/78 mmHg  Pulse 80  Temp(Src) 98.2 F (36.8 C) (Oral)  Resp 14  Ht 5\' 6"  (1.676 m)  Wt 144 lb (65.318 kg)  BMI 23.25 kg/m2 GEN- NAD, alert and oriented x3 Skin- left buttocks- dime size abscess- TTP pustule at center, mild erythema  Procedure- Incision and Drainage Procedure explained to patient questions answered benefits and risks discussed verbal consent obtained. Antiseptic-Betadine Anesthesia-lidocaine 1% with Epi Incision performed large amount of pus expressed Culture taken Minimal blood loss Patient tolerated procedure well Bandage applied- NO PACKING          Assessment & Plan:      Problem List Items Addressed This Visit    None    Visit Diagnoses    Abscess of left buttock    -  Primary    Culture sent, start Bactrim    Relevant Orders    Wound culture       Note: This dictation was prepared with Dragon dictation along with smaller phrase technology. Any transcriptional errors that result from this process are unintentional.

## 2016-02-16 ENCOUNTER — Telehealth: Payer: Self-pay

## 2016-02-16 ENCOUNTER — Encounter: Payer: Self-pay | Admitting: Family Medicine

## 2016-02-16 DIAGNOSIS — E785 Hyperlipidemia, unspecified: Secondary | ICD-10-CM | POA: Insufficient documentation

## 2016-02-16 NOTE — Telephone Encounter (Signed)
Spoke to the pt and told ( per Dr.Xu) she should discuss with PCP if something other than sumatriptan can be prescribed for headaches. Since she had a recent stroke.

## 2016-02-17 ENCOUNTER — Other Ambulatory Visit: Payer: Self-pay | Admitting: *Deleted

## 2016-02-17 MED ORDER — ATORVASTATIN CALCIUM 40 MG PO TABS
40.0000 mg | ORAL_TABLET | Freq: Every day | ORAL | Status: DC
Start: 2016-02-17 — End: 2016-06-06

## 2016-02-18 LAB — WOUND CULTURE
GRAM STAIN: NONE SEEN
GRAM STAIN: NONE SEEN

## 2016-02-20 ENCOUNTER — Telehealth: Payer: Self-pay | Admitting: Family Medicine

## 2016-02-20 ENCOUNTER — Encounter: Payer: Self-pay | Admitting: Family Medicine

## 2016-02-20 NOTE — Telephone Encounter (Signed)
Pt has oral surgery scheduled tomorrow @ 10 am to remove 4 teeth. They plan to put her to sleep for the surgery and she would like to know if you approve this.  [phone]

## 2016-02-20 NOTE — Telephone Encounter (Signed)
I would recommend she hold her blood thinner for 3 days unless surgeon feels comfortable doing surgery while on it

## 2016-02-20 NOTE — Telephone Encounter (Signed)
Call placed to patient and patient made aware per VM.   Will contact in AM.

## 2016-02-20 NOTE — Telephone Encounter (Signed)
To MD

## 2016-02-21 NOTE — Telephone Encounter (Signed)
Call placed to patient.   Patient reports that she re-scheduled appointment for Monday so that she can hold blood thinner.

## 2016-03-05 ENCOUNTER — Ambulatory Visit (INDEPENDENT_AMBULATORY_CARE_PROVIDER_SITE_OTHER): Payer: Self-pay | Admitting: *Deleted

## 2016-03-05 DIAGNOSIS — I639 Cerebral infarction, unspecified: Secondary | ICD-10-CM

## 2016-03-06 NOTE — Progress Notes (Signed)
Carelink Summary Report / Loop Recorder 

## 2016-03-12 ENCOUNTER — Other Ambulatory Visit: Payer: Self-pay | Admitting: Neurology

## 2016-03-13 ENCOUNTER — Telehealth: Payer: Self-pay | Admitting: Neurology

## 2016-03-13 NOTE — Telephone Encounter (Signed)
Natalia Vohs [id number]  SAME UNSURE  [id number]  IS HER APPT 5/17 ?,AND WHAT IS THE TIME ALSO?   Called pt back this morning. Appt is for Research. I transferred pt to [place] to speak with pt.

## 2016-03-14 ENCOUNTER — Telehealth: Payer: Self-pay | Admitting: Family Medicine

## 2016-03-14 DIAGNOSIS — I10 Essential (primary) hypertension: Secondary | ICD-10-CM

## 2016-03-14 MED ORDER — CLONIDINE HCL 0.2 MG PO TABS: 0.2000 mg | ORAL_TABLET | Freq: Two times a day (BID) | ORAL | Status: DC

## 2016-03-14 NOTE — Telephone Encounter (Signed)
Pt needs a refill of clonidine. She says the pharmacy has faxed two requests but has not heard anything. She does not have any pills left.  [place]

## 2016-03-14 NOTE — Telephone Encounter (Signed)
Prescription refill request from pharmacy was sent to Dr. Erlinda Hong.   Prescription sent to pharmacy.

## 2016-03-24 LAB — CUP PACEART REMOTE DEVICE CHECK: Date Time Interrogation Session: 20170407223531

## 2016-03-24 NOTE — Progress Notes (Signed)
Carelink summary report received. Battery status OK. Normal device function. No new symptom episodes, tachy episodes, brady, or pause episodes. No new AF episodes. Monthly summary reports and ROV/PRN 

## 2016-04-03 ENCOUNTER — Ambulatory Visit (INDEPENDENT_AMBULATORY_CARE_PROVIDER_SITE_OTHER): Payer: Self-pay | Admitting: *Deleted

## 2016-04-03 DIAGNOSIS — I639 Cerebral infarction, unspecified: Secondary | ICD-10-CM

## 2016-04-04 NOTE — Progress Notes (Signed)
Carelink Summary Report / Loop Recorder 

## 2016-04-09 ENCOUNTER — Ambulatory Visit: Payer: Medicare Other | Admitting: Family Medicine

## 2016-04-11 LAB — CUP PACEART REMOTE DEVICE CHECK: Date Time Interrogation Session: 20170507230609

## 2016-04-25 ENCOUNTER — Encounter: Payer: Self-pay | Admitting: *Deleted

## 2016-04-25 ENCOUNTER — Ambulatory Visit (INDEPENDENT_AMBULATORY_CARE_PROVIDER_SITE_OTHER): Payer: Medicare Other | Admitting: Family Medicine

## 2016-04-25 VITALS — BP 120/78 | HR 76 | Temp 98.7°F | Resp 14 | Ht 66.0 in | Wt 137.0 lb

## 2016-04-25 DIAGNOSIS — L0293 Carbuncle, unspecified: Secondary | ICD-10-CM | POA: Diagnosis not present

## 2016-04-25 DIAGNOSIS — G629 Polyneuropathy, unspecified: Secondary | ICD-10-CM

## 2016-04-25 DIAGNOSIS — I639 Cerebral infarction, unspecified: Secondary | ICD-10-CM | POA: Diagnosis not present

## 2016-04-25 DIAGNOSIS — E785 Hyperlipidemia, unspecified: Secondary | ICD-10-CM

## 2016-04-25 DIAGNOSIS — R7301 Impaired fasting glucose: Secondary | ICD-10-CM | POA: Diagnosis not present

## 2016-04-25 DIAGNOSIS — R7302 Impaired glucose tolerance (oral): Secondary | ICD-10-CM | POA: Insufficient documentation

## 2016-04-25 DIAGNOSIS — L0292 Furuncle, unspecified: Secondary | ICD-10-CM

## 2016-04-25 DIAGNOSIS — I10 Essential (primary) hypertension: Secondary | ICD-10-CM | POA: Diagnosis not present

## 2016-04-25 DIAGNOSIS — E538 Deficiency of other specified B group vitamins: Secondary | ICD-10-CM | POA: Diagnosis not present

## 2016-04-25 HISTORY — DX: Furuncle, unspecified: L02.92

## 2016-04-25 HISTORY — DX: Polyneuropathy, unspecified: G62.9

## 2016-04-25 HISTORY — DX: Carbuncle, unspecified: L02.93

## 2016-04-25 MED ORDER — SULFAMETHOXAZOLE-TRIMETHOPRIM 800-160 MG PO TABS: 1.0000 | ORAL_TABLET | Freq: Two times a day (BID) | ORAL | Status: DC

## 2016-04-25 MED ORDER — HYDROCODONE-ACETAMINOPHEN 5-325 MG PO TABS: 1.0000 | ORAL_TABLET | Freq: Four times a day (QID) | ORAL | Status: DC | PRN

## 2016-04-25 MED ORDER — LORAZEPAM 0.5 MG PO TABS: 0.5000 mg | ORAL_TABLET | Freq: Two times a day (BID) | ORAL | Status: DC | PRN

## 2016-04-25 NOTE — Progress Notes (Signed)
Patient ID: Alicia Bolton, female   DOB: 1956-06-13, 60 y.o.   MRN: [id number]    Subjective:    Patient ID: Alicia Bolton, female    DOB: 01/04/1956, 60 y.o.   MRN: [id number]  Patient presents for F/U and Abscess Patient here for follow-up. History of stroke cryptogenic she has a loop recorder. Also has chronic pain firm cervical spinal stenosis. She still on a study drug for her stroke. She is due for repeat labs. Her last set of labs 2 months ago her LDL was 176 she was changed from Mevacor to Lipitor at bedtime She also has history of recurrent abscess/boil she has one in her right axilla it drained a week ago, but still has hard knot, she declines I and D   Review Of Systems:  GEN- denies fatigue, fever, weight loss,weakness, recent illness HEENT- denies eye drainage, change in vision, nasal discharge, CVS- denies chest pain, palpitations RESP- denies SOB, cough, wheeze ABD- denies N/V, change in stools, abd pain GU- denies dysuria, hematuria, dribbling, incontinence MSK- + joint pain, muscle aches, injury Neuro- denies headache, dizziness, syncope, seizure activity       Objective:    BP 120/78 mmHg  Pulse 76  Temp(Src) 98.7 F (37.1 C) (Oral)  Resp 14  Ht 5\' 6"  (1.676 m)  Wt 137 lb (62.143 kg)  BMI 22.12 kg/m2 GEN- NAD, alert and oriented x3 HEENT- PERRL, EOMI, non injected sclera, pink conjunctiva, MMM, oropharynx clear Neck- Supple, no thyromegaly CVS- RRR, no murmur RESP-CTAB Skin- Right axilla dime dize swelling, +induration, no fluctunace no erythema  EXT- No edema Pulses- Radial, DP- 2+         Assessment & Plan:      Problem List Items Addressed This Visit    None      Note: This dictation was prepared with Dragon dictation along with smaller phrase technology. Any transcriptional errors that result from this process are unintentional.

## 2016-04-25 NOTE — Patient Instructions (Addendum)
We will call with lab results  Continue current medications prescribed  Take antibiotics  F/U 4 months

## 2016-04-26 ENCOUNTER — Encounter: Payer: Self-pay | Admitting: Family Medicine

## 2016-04-26 LAB — COMPREHENSIVE METABOLIC PANEL
ALBUMIN: 4.2 g/dL (ref 3.6–5.1)
ALK PHOS: 82 U/L (ref 33–130)
ALT: 9 U/L (ref 6–29)
AST: 13 U/L (ref 10–35)
BUN: 10 mg/dL (ref 7–25)
CHLORIDE: 101 mmol/L (ref 98–110)
CO2: 27 mmol/L (ref 20–31)
Calcium: 9.7 mg/dL (ref 8.6–10.4)
Creat: 0.81 mg/dL (ref 0.50–1.05)
Glucose, Bld: 98 mg/dL (ref 70–99)
POTASSIUM: 3.9 mmol/L (ref 3.5–5.3)
Sodium: 139 mmol/L (ref 135–146)
TOTAL PROTEIN: 7.2 g/dL (ref 6.1–8.1)
Total Bilirubin: 0.8 mg/dL (ref 0.2–1.2)

## 2016-04-26 LAB — LIPID PANEL
CHOLESTEROL: 153 mg/dL (ref 125–200)
HDL: 46 mg/dL (ref >=46)
LDL Cholesterol: 77 mg/dL (ref <130)
TRIGLYCERIDES: 148 mg/dL (ref <150)
Total CHOL/HDL Ratio: 3.3 Ratio (ref <=5.0)
VLDL: 30 mg/dL (ref <30)

## 2016-04-26 LAB — HEMOGLOBIN A1C
HEMOGLOBIN A1C: 6.2 % — AB (ref <5.7)
MEAN PLASMA GLUCOSE: 131 mg/dL

## 2016-04-26 LAB — VITAMIN B12: Vitamin B-12: 455 pg/mL (ref 200–1100)

## 2016-04-26 NOTE — Assessment & Plan Note (Signed)
Will try antibiotics, advised if no improvement needs I &D  compresses

## 2016-04-26 NOTE — Assessment & Plan Note (Signed)
Overall doing okay, she is more mobile now Goal LDL 70 or below BP has good control On ASA

## 2016-04-26 NOTE — Assessment & Plan Note (Signed)
Check A1C, history of stroke

## 2016-05-02 LAB — CUP PACEART REMOTE DEVICE CHECK: Date Time Interrogation Session: 20170606230606

## 2016-05-03 ENCOUNTER — Ambulatory Visit (INDEPENDENT_AMBULATORY_CARE_PROVIDER_SITE_OTHER): Payer: Self-pay | Admitting: *Deleted

## 2016-05-03 DIAGNOSIS — I639 Cerebral infarction, unspecified: Secondary | ICD-10-CM

## 2016-05-04 NOTE — Progress Notes (Signed)
Carelink Summary Report / Loop Recorder 

## 2016-05-16 ENCOUNTER — Ambulatory Visit: Payer: Medicare Other | Admitting: Family Medicine

## 2016-05-17 LAB — CUP PACEART REMOTE DEVICE CHECK: Date Time Interrogation Session: 20170707000531

## 2016-06-02 ENCOUNTER — Other Ambulatory Visit: Payer: Self-pay | Admitting: Family Medicine

## 2016-06-02 DIAGNOSIS — I10 Essential (primary) hypertension: Secondary | ICD-10-CM

## 2016-06-04 ENCOUNTER — Ambulatory Visit (INDEPENDENT_AMBULATORY_CARE_PROVIDER_SITE_OTHER): Payer: Self-pay | Admitting: *Deleted

## 2016-06-04 DIAGNOSIS — I639 Cerebral infarction, unspecified: Secondary | ICD-10-CM

## 2016-06-04 NOTE — Progress Notes (Signed)
Carelink Summary Report / Loop Recorder 

## 2016-06-06 ENCOUNTER — Encounter: Payer: Self-pay | Admitting: Family Medicine

## 2016-06-06 ENCOUNTER — Ambulatory Visit (INDEPENDENT_AMBULATORY_CARE_PROVIDER_SITE_OTHER): Payer: Medicare Other | Admitting: Family Medicine

## 2016-06-06 VITALS — BP 128/78 | HR 82 | Temp 98.1°F | Resp 14 | Ht 66.0 in | Wt 131.0 lb

## 2016-06-06 DIAGNOSIS — R7302 Impaired glucose tolerance (oral): Secondary | ICD-10-CM | POA: Diagnosis not present

## 2016-06-06 DIAGNOSIS — I639 Cerebral infarction, unspecified: Secondary | ICD-10-CM

## 2016-06-06 DIAGNOSIS — M4802 Spinal stenosis, cervical region: Secondary | ICD-10-CM | POA: Diagnosis not present

## 2016-06-06 DIAGNOSIS — M6248 Contracture of muscle, other site: Secondary | ICD-10-CM

## 2016-06-06 DIAGNOSIS — I10 Essential (primary) hypertension: Secondary | ICD-10-CM

## 2016-06-06 DIAGNOSIS — M62838 Other muscle spasm: Secondary | ICD-10-CM

## 2016-06-06 HISTORY — DX: Other muscle spasm: M62.838

## 2016-06-06 MED ORDER — VALACYCLOVIR HCL 500 MG PO TABS: 500.0000 mg | ORAL_TABLET | Freq: Every day | ORAL | 6 refills | Status: DC

## 2016-06-06 MED ORDER — CLONIDINE HCL 0.2 MG PO TABS: 0.2000 mg | ORAL_TABLET | Freq: Two times a day (BID) | ORAL | 6 refills | Status: DC

## 2016-06-06 MED ORDER — HYDROCODONE-ACETAMINOPHEN 5-325 MG PO TABS: 1.0000 | ORAL_TABLET | Freq: Four times a day (QID) | ORAL | 0 refills | Status: DC | PRN

## 2016-06-06 MED ORDER — ATORVASTATIN CALCIUM 40 MG PO TABS: 40.0000 mg | ORAL_TABLET | Freq: Every day | ORAL | 6 refills | Status: DC

## 2016-06-06 MED ORDER — HYDROCHLOROTHIAZIDE 25 MG PO TABS: 25.0000 mg | ORAL_TABLET | Freq: Every day | ORAL | 6 refills | Status: DC

## 2016-06-06 MED ORDER — LORAZEPAM 0.5 MG PO TABS: 0.5000 mg | ORAL_TABLET | Freq: Two times a day (BID) | ORAL | 1 refills | Status: DC | PRN

## 2016-06-06 NOTE — Patient Instructions (Addendum)
Referral to physical therapy  Pain medication refilled  F/U 3 months

## 2016-06-06 NOTE — Assessment & Plan Note (Signed)
Well controlled 

## 2016-06-06 NOTE — Assessment & Plan Note (Signed)
Discussed low carbs, but she is not diabetic, has risk factors for it She was being very extreme with her diet, advised she can still eat healthy carbs

## 2016-06-06 NOTE — Assessment & Plan Note (Addendum)
History of stenosis status post multiple neck surgeries she gets spasm and stiffness some of this am sure some scar tissue. I will refer her back to physical therapy to they can work on range of motion and help with the spasm Norco refilled

## 2016-06-06 NOTE — Progress Notes (Signed)
   Subjective:    Patient ID: Alicia Bolton, female    DOB: 19-Sep-1956, 60 y.o.   MRN: [id number]  Patient presents for Follow-up (is fasting) and Fibromyalgia (states that she is having increased shoulder pain) Here for interim follow-up on borderline diabetes. Her A1c was 6.2% Cholesterol was normal renal function preserved He wants to make sure that she was eating correctly she has cut out a lot of her carb states she is eating rice potatoes bread daily she cut these things back her weight is down 6 pounds. She states that she feels well otherwise. She doesn't request refills on her medications as well as her pain medicine. She also requests a referral to physical therapy again she has had multiple neck surgeries secondary to cervical spinal stenosis degenerative disc disease and has significant muscle spasm and stiffness since his surgeries. She is in physical therapy prior to her stroke we will like to resume therapy they did dry needling and massage which helped her neck pain. She does use the hydrocodone very sparingly typically uses Tylenol heating pads   Review Of Systems:  GEN- denies fatigue, fever, weight loss,weakness, recent illness HEENT- denies eye drainage, change in vision, nasal discharge, CVS- denies chest pain, palpitations RESP- denies SOB, cough, wheeze ABD- denies N/V, change in stools, abd pain GU- denies dysuria, hematuria, dribbling, incontinence MSK- + joint pain, +muscle aches, injury Neuro- denies headache, dizziness, syncope, seizure activity       Objective:    BP 128/78 (BP Location: Left Arm, Patient Position: Sitting, Cuff Size: Normal)   Pulse 82   Temp 98.1 F (36.7 C) (Oral)   Resp 14   Ht 5\' 6"  (1.676 m)   Wt 131 lb (59.4 kg)   BMI 21.14 kg/m  GEN- NAD, alert and oriented x3 HEENT- PERRL, EOMI, non injected sclera, pink conjunctiva, MMM, oropharynx clear Neck- Supple,decreased ROM, mild spasm trapeizus and paraspinals            Assessment & Plan:      Problem List Items Addressed This Visit    Spinal stenosis of cervical region    History of stenosis status post multiple neck surgeries she gets spasm and stiffness some of this am sure some scar tissue. I will refer her back to physical therapy to they can work on range of motion and help with the spasm Norco refilled       Relevant Orders   Ambulatory referral to Physical Therapy   Muscle spasms of neck   HTN (hypertension) - Primary    Well controlled      Relevant Medications   hydrochlorothiazide (HYDRODIURIL) 25 MG tablet   cloNIDine (CATAPRES) 0.2 MG tablet   atorvastatin (LIPITOR) 40 MG tablet   Glucose intolerance (impaired glucose tolerance)    Discussed low carbs, but she is not diabetic, has risk factors for it She was being very extreme with her diet, advised she can still eat healthy carbs       Other Visit Diagnoses   None.     Note: This dictation was prepared with Dragon dictation along with smaller phrase technology. Any transcriptional errors that result from this process are unintentional.

## 2016-06-11 LAB — CUP PACEART REMOTE DEVICE CHECK: Date Time Interrogation Session: 20170806003534

## 2016-07-03 ENCOUNTER — Ambulatory Visit (INDEPENDENT_AMBULATORY_CARE_PROVIDER_SITE_OTHER): Payer: Self-pay | Admitting: *Deleted

## 2016-07-03 DIAGNOSIS — I639 Cerebral infarction, unspecified: Secondary | ICD-10-CM

## 2016-07-03 NOTE — Progress Notes (Signed)
Carelink Summary Report / Loop Recorder 

## 2016-07-17 DIAGNOSIS — M5023 Other cervical disc displacement, cervicothoracic region: Secondary | ICD-10-CM | POA: Diagnosis not present

## 2016-07-17 DIAGNOSIS — M542 Cervicalgia: Secondary | ICD-10-CM | POA: Diagnosis not present

## 2016-07-28 LAB — CUP PACEART REMOTE DEVICE CHECK: Date Time Interrogation Session: 20170905003636

## 2016-07-28 NOTE — Progress Notes (Signed)
Carelink summary report received. Battery status OK. Normal device function. No new symptom episodes, tachy episodes, brady, or pause episodes. No new AF episodes. Monthly summary reports and ROV/PRN 

## 2016-08-01 ENCOUNTER — Ambulatory Visit (INDEPENDENT_AMBULATORY_CARE_PROVIDER_SITE_OTHER): Payer: Self-pay | Admitting: *Deleted

## 2016-08-01 DIAGNOSIS — I639 Cerebral infarction, unspecified: Secondary | ICD-10-CM

## 2016-08-02 NOTE — Progress Notes (Signed)
Carelink Summary Report / Loop Recorder 

## 2016-08-03 ENCOUNTER — Ambulatory Visit: Payer: Medicare Other | Admitting: Family Medicine

## 2016-08-14 ENCOUNTER — Ambulatory Visit (INDEPENDENT_AMBULATORY_CARE_PROVIDER_SITE_OTHER): Payer: Medicare Other | Admitting: Family Medicine

## 2016-08-14 ENCOUNTER — Encounter: Payer: Self-pay | Admitting: Family Medicine

## 2016-08-14 VITALS — BP 138/78 | HR 84 | Temp 98.1°F | Resp 14 | Ht 66.0 in | Wt 130.0 lb

## 2016-08-14 DIAGNOSIS — I10 Essential (primary) hypertension: Secondary | ICD-10-CM

## 2016-08-14 DIAGNOSIS — L729 Follicular cyst of the skin and subcutaneous tissue, unspecified: Secondary | ICD-10-CM | POA: Diagnosis not present

## 2016-08-14 DIAGNOSIS — I639 Cerebral infarction, unspecified: Secondary | ICD-10-CM | POA: Diagnosis not present

## 2016-08-14 DIAGNOSIS — R7302 Impaired glucose tolerance (oral): Secondary | ICD-10-CM

## 2016-08-14 DIAGNOSIS — E782 Mixed hyperlipidemia: Secondary | ICD-10-CM | POA: Diagnosis not present

## 2016-08-14 DIAGNOSIS — M4802 Spinal stenosis, cervical region: Secondary | ICD-10-CM | POA: Diagnosis not present

## 2016-08-14 LAB — COMPREHENSIVE METABOLIC PANEL
ALBUMIN: 4.3 g/dL (ref 3.6–5.1)
ALK PHOS: 89 U/L (ref 33–130)
ALT: 10 U/L (ref 6–29)
AST: 14 U/L (ref 10–35)
BILIRUBIN TOTAL: 0.7 mg/dL (ref 0.2–1.2)
BUN: 15 mg/dL (ref 7–25)
CHLORIDE: 103 mmol/L (ref 98–110)
CO2: 26 mmol/L (ref 20–31)
CREATININE: 0.89 mg/dL (ref 0.50–1.05)
Calcium: 9.7 mg/dL (ref 8.6–10.4)
Glucose, Bld: 91 mg/dL (ref 70–99)
Potassium: 4.3 mmol/L (ref 3.5–5.3)
SODIUM: 139 mmol/L (ref 135–146)
TOTAL PROTEIN: 7.6 g/dL (ref 6.1–8.1)

## 2016-08-14 LAB — CBC WITH DIFFERENTIAL/PLATELET
BASOS PCT: 0 %
Basophils Absolute: 0 cells/uL (ref 0–200)
EOS PCT: 1 %
Eosinophils Absolute: 100 cells/uL (ref 15–500)
HCT: 43.3 % (ref 35.0–45.0)
Hemoglobin: 14.7 g/dL (ref 12.0–15.0)
LYMPHS PCT: 36 %
Lymphs Abs: 3600 cells/uL (ref 850–3900)
MCH: 31.7 pg (ref 27.0–33.0)
MCHC: 33.9 g/dL (ref 32.0–36.0)
MCV: 93.3 fL (ref 80.0–100.0)
MONO ABS: 400 {cells}/uL (ref 200–950)
MONOS PCT: 4 %
MPV: 9.1 fL (ref 7.5–12.5)
NEUTROS PCT: 59 %
Neutro Abs: 5900 cells/uL (ref 1500–7800)
PLATELETS: 336 10*3/uL (ref 140–400)
RBC: 4.64 MIL/uL (ref 3.80–5.10)
RDW: 13.9 % (ref 11.0–15.0)
WBC: 10 10*3/uL (ref 3.8–10.8)

## 2016-08-14 LAB — LIPID PANEL
CHOL/HDL RATIO: 3.6 ratio (ref <=5.0)
CHOLESTEROL: 164 mg/dL (ref 125–200)
HDL: 46 mg/dL (ref >=46)
LDL Cholesterol: 87 mg/dL (ref <130)
Triglycerides: 155 mg/dL — ABNORMAL HIGH (ref <150)
VLDL: 31 mg/dL — ABNORMAL HIGH (ref <30)

## 2016-08-14 LAB — HEMOGLOBIN A1C
Hgb A1c MFr Bld: 5.7 % — ABNORMAL HIGH (ref <5.7)
MEAN PLASMA GLUCOSE: 117 mg/dL

## 2016-08-14 MED ORDER — HYDROCODONE-ACETAMINOPHEN 5-325 MG PO TABS: 1.0000 | ORAL_TABLET | Freq: Four times a day (QID) | ORAL | 0 refills | Status: DC | PRN

## 2016-08-14 NOTE — Assessment & Plan Note (Signed)
Blood pressure is well controlled we'll recheck her cholesterol this is benign goal her last LDL 77 also recheck the A1c she is borderline diabetes last A1c 6.2% goals to get her to a completely normal A1c. She will continue her current medications. With regards to the cystis feels like a possible lipoma will just monitor at this time.  For her spinal stenosis I refilled her hydrocodone

## 2016-08-14 NOTE — Assessment & Plan Note (Signed)
Norco refilled

## 2016-08-14 NOTE — Patient Instructions (Signed)
We will call with lab results F/U 4 months for Physical  

## 2016-08-14 NOTE — Progress Notes (Signed)
   Subjective:    Patient ID: Alicia Bolton, female    DOB: Jun 10, 1956, 60 y.o.   MRN: [id number]  Patient presents for F/U (is fasting) and Knot to Lower R Back (x months- cyst like area to lower back- denies pain) Patient here to follow-up chronic medical problem. She is taking her blood pressure medicine and spry. She has history of cryptogenic stroke. He is taking her cholesterol medications as prescribed as well. She's also borderline diabetic she has been trying to maintain her diet   He notices a lump on her right lower back about a month ago it is not painful side increased in size. She does have history of recurrent boils She requests refill on her pain medication she has history of multiple neck surgeries spinal stenosis  Declines flu  Review Of Systems:  GEN- denies fatigue, fever, weight loss,weakness, recent illness HEENT- denies eye drainage, change in vision, nasal discharge, CVS- denies chest pain, palpitations RESP- denies SOB, cough, wheeze ABD- denies N/V, change in stools, abd pain GU- denies dysuria, hematuria, dribbling, incontinence MSK- + joint pain, muscle aches, injury Neuro- denies headache, dizziness, syncope, seizure activity       Objective:    BP 138/78 (BP Location: Left Arm, Patient Position: Sitting, Cuff Size: Normal)   Pulse 84   Temp 98.1 F (36.7 C) (Oral)   Resp 14   Ht 5\' 6"  (1.676 m)   Wt 130 lb (59 kg)   BMI 20.98 kg/m  GEN- NAD, alert and oriented x3 HEENT- PERRL, EOMI, non injected sclera, pink conjunctiva, MMM, oropharynx clear CVS- RRR, no murmur RESP-CTAB Skin- Right lower back nickle size soft tissue mass, NT, no erythema, no core at center EXT- No edema Pulses- Radial  2+        Assessment & Plan:      Problem List Items Addressed This Visit    Spinal stenosis of cervical region    Norco refilled       Hyperlipidemia   Relevant Orders   Lipid panel   HTN (hypertension) - Primary    Blood pressure is well  controlled we'll recheck her cholesterol this is benign goal her last LDL 77 also recheck the A1c she is borderline diabetes last A1c 6.2% goals to get her to a completely normal A1c. She will continue her current medications. With regards to the cystis feels like a possible lipoma will just monitor at this time.  For her spinal stenosis I refilled her hydrocodone      Relevant Orders   Comprehensive metabolic panel   CBC with Differential/Platelet   Glucose intolerance (impaired glucose tolerance)   Relevant Orders   Hemoglobin A1c    Other Visit Diagnoses    Cyst of skin       most likely lipoma      Note: This dictation was prepared with Dragon dictation along with smaller phrase technology. Any transcriptional errors that result from this process are unintentional.

## 2016-08-17 ENCOUNTER — Telehealth: Payer: Self-pay | Admitting: *Deleted

## 2016-08-17 DIAGNOSIS — Z1211 Encounter for screening for malignant neoplasm of colon: Secondary | ICD-10-CM

## 2016-08-17 NOTE — Telephone Encounter (Signed)
Referral orders placed.   Call placed to patient and patient made aware.  

## 2016-08-17 NOTE — Telephone Encounter (Signed)
-----   Message from Alycia Rossetti, MD sent at 08/17/2016  7:52 AM EDT ----- Regarding: FW: Find colonoscopuy Call pt Due for colonoscopy, send referral back to [facility] ----- Message ----- From: Eden Lathe Six, LPN Sent: [id number]   2:00 PM To: Alycia Rossetti, MD Subject: RE: Find colonoscopuy                          Call placed to [facility].   Was advised that patient is in their system, but no colonoscopy noted. States that if she did have one with them, it was before 2006. ----- Message ----- From: Alycia Rossetti, MD Sent: 08/14/2016   3:02 PM To: Eden Lathe Six, LPN Subject: FW: Find colonoscopuy                           Call [facility] and see if colonoscopy was done by their office.    ----- Message ----- From: Alycia Rossetti, MD Sent: 08/14/2016   8:35 AM To: Alycia Rossetti, MD Subject: Find colonoscopuy

## 2016-08-22 ENCOUNTER — Encounter: Payer: Self-pay | Admitting: Physician Assistant

## 2016-08-22 ENCOUNTER — Telehealth: Payer: Self-pay | Admitting: Cardiology

## 2016-08-22 NOTE — Telephone Encounter (Signed)
Spoke w/ pt and informed her that the appointment will be done w/ her home monitor. No need to come into the office. Pt verbalized understanding.

## 2016-08-31 ENCOUNTER — Ambulatory Visit (INDEPENDENT_AMBULATORY_CARE_PROVIDER_SITE_OTHER): Payer: Medicare Other | Admitting: *Deleted

## 2016-08-31 DIAGNOSIS — I639 Cerebral infarction, unspecified: Secondary | ICD-10-CM | POA: Diagnosis not present

## 2016-09-03 ENCOUNTER — Ambulatory Visit
Admission: RE | Admit: 2016-09-03 | Discharge: 2016-09-03 | Disposition: A | Discharge status: Home / Self Care | Payer: Medicare Other | Source: Ambulatory Visit | Attending: Family Medicine | Admitting: Family Medicine

## 2016-09-03 ENCOUNTER — Other Ambulatory Visit: Payer: Self-pay | Admitting: Family Medicine

## 2016-09-03 ENCOUNTER — Encounter: Payer: Self-pay | Admitting: Family Medicine

## 2016-09-03 ENCOUNTER — Ambulatory Visit (INDEPENDENT_AMBULATORY_CARE_PROVIDER_SITE_OTHER): Payer: Medicare Other | Admitting: Family Medicine

## 2016-09-03 VITALS — BP 108/64 | HR 72 | Temp 98.6°F | Resp 18 | Wt 134.0 lb

## 2016-09-03 DIAGNOSIS — N6452 Nipple discharge: Secondary | ICD-10-CM

## 2016-09-03 DIAGNOSIS — N611 Abscess of the breast and nipple: Secondary | ICD-10-CM

## 2016-09-03 DIAGNOSIS — R922 Inconclusive mammogram: Secondary | ICD-10-CM | POA: Diagnosis not present

## 2016-09-03 DIAGNOSIS — I639 Cerebral infarction, unspecified: Secondary | ICD-10-CM | POA: Diagnosis not present

## 2016-09-03 MED ORDER — SULFAMETHOXAZOLE-TRIMETHOPRIM 800-160 MG PO TABS: 1.0000 | ORAL_TABLET | Freq: Two times a day (BID) | ORAL | 0 refills | Status: DC

## 2016-09-03 NOTE — Patient Instructions (Signed)
Start antibiotics Get the ultrasound done  F/u as needed

## 2016-09-03 NOTE — Progress Notes (Signed)
   Subjective:    Patient ID: Alicia Bolton, female    DOB: 1956-08-04, 60 y.o.   MRN: [id number]  Patient presents for left breast pain   Pt here with left breast mass and discharge. History of mastitis > 3 years ago, has intermittant pain and foul odor  discharge from the breast On and off. Over the weekend she started having pain again in the left breast along with some discharge with an odor. She's not had any fever no redness of the breast. Her last mammogram and ultrasound the left breast was this past spring in February and March. Nothing was found at that time        Review Of Systems:  GEN- denies fatigue, fever, weight loss,weakness, recent illness HEENT- denies eye drainage, change in vision, nasal discharge, CVS- denies chest pain, palpitations RESP- denies SOB, cough, wheeze ABD- denies N/V, change in stools, abd pain GU- denies dysuria, hematuria, dribbling, incontinence MSK- denies joint pain, muscle aches, injury Neuro- denies headache, dizziness, syncope, seizure activity       Objective:    BP 108/64 (BP Location: Right Arm, Patient Position: Sitting, Cuff Size: Normal)   Pulse 72   Temp 98.6 F (37 C) (Oral)   Resp 18   Wt 134 lb (60.8 kg)   BMI 21.63 kg/m  GEN- NAD, alert and oriented x3 Breast- normal symmetry, no nipple inversion,no nipple drainage, Left breat 5 o clock position , mass palpated nickle size, TTP, yellow pus expressed from nipple, culture taken  No erythema no Peu-d'orange appearance  Nodes- no axillary nodes         Assessment & Plan:      Problem List Items Addressed This Visit    None    Visit Diagnoses    Left breast abscess    -  Primary   concern for abscess based on discharge- bacterial smell. Send for culture, start bactrim DS, imaging of breast to be done    Relevant Orders   MM Digital Diagnostic Unilat L   US BREAST COMPLETE UNI LEFT INC AXILLA   Nipple discharge       Relevant Orders   MM Digital Diagnostic  Unilat L   US BREAST COMPLETE UNI LEFT INC AXILLA      Note: This dictation was prepared with Dragon dictation along with smaller phrase technology. Any transcriptional errors that result from this process are unintentional.

## 2016-09-03 NOTE — Progress Notes (Signed)
Carelink Summary Report / Loop Recorder 

## 2016-09-05 ENCOUNTER — Telehealth: Payer: Self-pay | Admitting: Neurology

## 2016-09-05 NOTE — Telephone Encounter (Signed)
I called the patient today upon request from Community education officer.  Patient had called him earlier expressing desire to stop  her study particpation in RESPECT ESUS trial as she felt it was financially not in her interest. She apparently had received a check of  $350 for the first 3 months which she felt was for 1 visit and she expected the same check on subsequent visits. I explained to her that the first check actually included payment for screening visit, randomization visit and the first safety check visit and all subsequent follow-up visit payment would be $100 as clearly stated in informed consent which she has a copy of.. She did not appear to be satisfied with this but stated she would keep her upcoming study visit next week and make a final decision and let Dr. Erlinda Hong and Wynelle Cleveland know.

## 2016-09-06 LAB — WOUND CULTURE
GRAM STAIN: NONE SEEN
GRAM STAIN: NONE SEEN
Gram Stain: NONE SEEN
Organism ID, Bacteria: NO GROWTH

## 2016-09-09 LAB — CUP PACEART REMOTE DEVICE CHECK
Implantable Pulse Generator Implant Date: 20161208
MDC IDC SESS DTM: 20171005023601

## 2016-09-09 NOTE — Progress Notes (Signed)
Carelink summary report received. Battery status OK. Normal device function. No new symptom episodes, tachy episodes, brady, or pause episodes. No new AF episodes. Monthly summary reports and ROV/PRN 

## 2016-09-10 ENCOUNTER — Telehealth: Payer: Self-pay | Admitting: *Deleted

## 2016-09-10 NOTE — Telephone Encounter (Signed)
Received call from patient.   Inquired as to results of culture.   Advised that culture is negative.   MD recommends to complete Abtx for mastitis.   Call placed to patient and patient made aware.

## 2016-09-16 ENCOUNTER — Ambulatory Visit (HOSPITAL_COMMUNITY)
Admission: EM | Admit: 2016-09-16 | Discharge: 2016-09-16 | Disposition: A | Discharge status: Home / Self Care | Payer: Medicare Other | Attending: Emergency Medicine | Admitting: Emergency Medicine

## 2016-09-16 ENCOUNTER — Encounter (HOSPITAL_COMMUNITY): Payer: Self-pay | Admitting: Emergency Medicine

## 2016-09-16 DIAGNOSIS — M65311 Trigger thumb, right thumb: Secondary | ICD-10-CM | POA: Diagnosis not present

## 2016-09-16 DIAGNOSIS — M778 Other enthesopathies, not elsewhere classified: Secondary | ICD-10-CM

## 2016-09-16 DIAGNOSIS — M779 Enthesopathy, unspecified: Principal | ICD-10-CM

## 2016-09-16 NOTE — Discharge Instructions (Signed)
Wear the splint most all of the time. May remove it periodically for bathing. But when working and using the hand with a splint. Apply ice off and on for swelling and discomfort. The splint and ice should provide for relief of discomfort. Be sure to call the orthopedist as soon as possible for an appointment.

## 2016-09-16 NOTE — ED Provider Notes (Signed)
CSN: [id number]     Arrival date & time 09/16/16  1200 History   None    Chief Complaint  Patient presents with  . Hand Pain   (Consider location/radiation/quality/duration/timing/severity/associated sxs/prior Treatment) 60 year old female complaining of pain to the right thumb for 7-10 days. Insidious onset. She knows of no known injury or repetitive activity that caused pain. The pain is located primarily to the extensor surface of the thumb and she also complains of the thumb locking and sticking at the IP joint. There is mild swelling in the length of the thumb. No deformity. She states she does not do any sort of work or activity with her right hand because she is disabled.      Past Medical History:  Diagnosis Date  . Arthritis   . Hypertension   . Migraines   . Neuromuscular disorder ([place])   . PONV (postoperative nausea and vomiting)   . Smoker   . Stroke [facility]) [id number]   Embolic Right ACA,  ILR placed Dr. Rayann Heman  . Tear of medial meniscus of right knee 10/09/2013   Past Surgical History:  Procedure Laterality Date  . ABDOMINAL HYSTERECTOMY  1997  . ANTERIOR CERVICAL DECOMP/DISCECTOMY FUSION N/A 10/25/2014   Procedure: CERVICAL THREE-FOUR, CERVICAL FOUR FIVE ANTERIOR CERVICAL DECOMPRESSION/DISCECTOMY FUSION 2 LEVEL/HARDWARE REMOVALCERVICAL FIVE-SEVEN;  Surgeon: Elaina Hoops, MD;  Location: [place] NEURO ORS;  Service: Neurosurgery;  Laterality: N/A;  . CARPAL TUNNEL RELEASE Left 10/25/2014   Procedure: CARPAL TUNNEL RELEASE;  Surgeon: Elaina Hoops, MD;  Location: [place] NEURO ORS;  Service: Neurosurgery;  Laterality: Left;  . CERVICAL FUSION  2007  . CESAREAN SECTION  1985  . CHEST SURGERY  04/2015   Benign chest mass removed- [place]  . COLONOSCOPY    . EP IMPLANTABLE DEVICE N/A 10/06/2015   Procedure: Loop Recorder Insertion;  Surgeon: Thompson Grayer, MD;  Location: [place] CV LAB;  Service: Cardiovascular;  Laterality: N/A;  . KNEE ARTHROSCOPY  Right 1999  . KNEE ARTHROSCOPY WITH MEDIAL MENISECTOMY Right 10/09/2013   Procedure: RIGHT KNEE ARTHROSCOPY WITH MEDIAL MENISECTOMY;  Surgeon: Johnny Bridge, MD;  Location: [place];  Service: Orthopedics;  Laterality: Right;  clean  . TEE WITHOUT CARDIOVERSION N/A 10/05/2015   Procedure: TRANSESOPHAGEAL ECHOCARDIOGRAM (TEE);  Surgeon: Thayer Headings, MD;  Location: [facility] ENDOSCOPY;  Service: Cardiovascular;  Laterality: N/A;   Family History  Problem Relation Age of Onset  . Hypertension Mother   . Diabetes Mother   . Cancer Maternal Grandmother     colon   Social History  Substance Use Topics  . Smoking status: Former Smoker    Types: Cigarettes    Quit date: 07/07/2014  . Smokeless tobacco: Never Used  . Alcohol use No   OB History    Gravida Para Term Preterm AB Living   2 1 1   1 1    SAB TAB Ectopic Multiple Live Births     1           Review of Systems  Constitutional: Negative for activity change, chills and fever.  HENT: Negative.   Respiratory: Negative.   Cardiovascular: Negative.   Musculoskeletal:       As per HPI  Skin: Negative for color change, pallor and rash.  Neurological: Negative.   All other systems reviewed and are negative.   Allergies  Floxin [ofloxacin] and Penicillins  Home Medications   Prior to Admission medications   Medication  Sig Start Date End Date Taking? Authorizing Provider  atorvastatin (LIPITOR) 40 MG tablet Take 1 tablet (40 mg total) by mouth daily. 06/06/16  Yes Alycia Rossetti, MD  cloNIDine (CATAPRES) 0.2 MG tablet Take 1 tablet (0.2 mg total) by mouth 2 (two) times daily. 06/06/16  Yes Alycia Rossetti, MD  hydrochlorothiazide (HYDRODIURIL) 25 MG tablet Take 1 tablet (25 mg total) by mouth daily. 06/06/16  Yes Alycia Rossetti, MD  HYDROcodone-acetaminophen (NORCO) 5-325 MG tablet Take 1 tablet by mouth every 6 (six) hours as needed for moderate pain. 08/14/16  Yes Alycia Rossetti, MD  LORazepam (ATIVAN) 0.5 MG  tablet Take 1 tablet (0.5 mg total) by mouth 2 (two) times daily as needed for anxiety. 06/06/16   Alycia Rossetti, MD  sulfamethoxazole-trimethoprim (BACTRIM DS,SEPTRA DS) 800-160 MG tablet Take 1 tablet by mouth 2 (two) times daily. 09/03/16   Alycia Rossetti, MD  valACYclovir (VALTREX) 500 MG tablet Take 1 tablet (500 mg total) by mouth daily. 06/06/16   Alycia Rossetti, MD   Meds Ordered and Administered this Visit  Medications - No data to display  BP 130/91 (BP Location: Left Arm)   Pulse 82   Temp 98.4 F (36.9 C) (Oral)   Resp 16   SpO2 100%  No data found.   Physical Exam  Constitutional: She is oriented to person, place, and time. She appears well-developed and well-nourished. No distress.  HENT:  Head: Normocephalic and atraumatic.  Eyes: EOM are normal.  Neck: Normal range of motion. Neck supple.  Musculoskeletal: She exhibits edema and tenderness. She exhibits no deformity.  Mild swelling along the length of the right thumb. There is tenderness primarily to the IP joint and MCP. Less tenderness over the extensor surface of the phalanges. No swelling at the base of the thumb however there is minor tenderness. No deformity. Distal neurovascular motor sensory is intact. Capillary refill is brisk. The patient is able to flex and extend the IP joint however there is a clicking and locking at that joint and often she needs to use the other hand to extend the joint after it locks.  Lymphadenopathy:    She has no cervical adenopathy.  Neurological: She is alert and oriented to person, place, and time. No cranial nerve deficit.  Skin: Skin is warm and dry. No rash noted. No erythema.  Psychiatric: She has a normal mood and affect.  Nursing note and vitals reviewed.   Urgent Care Course   Clinical Course     Procedures (including critical care time)  Labs Review Labs Reviewed - No data to display  Imaging Review No results found.   Visual Acuity Review  Right Eye  Distance:   Left Eye Distance:   Bilateral Distance:    Right Eye Near:   Left Eye Near:    Bilateral Near:         MDM   1. Tendinitis of thumb   2. Trigger finger of right thumb    Wear the splint most all of the time. May remove it periodically for bathing. But when working and using the hand with a splint. Apply ice off and on for swelling and discomfort. The splint and ice should provide for relief of discomfort. Be sure to call the orthopedist as soon as possible for an appointment. Finger splint    Janne Napoleon, NP 09/16/16 1246

## 2016-09-16 NOTE — ED Triage Notes (Signed)
Patient presents to [facility] today with a complaint of pain to her right thumb, she states that her thumb locks and hand is swollen, she states these symptoms have been occurring for the last 7-10 days. Patient states that there has been no injury.

## 2016-09-17 ENCOUNTER — Encounter: Payer: Self-pay | Admitting: Diagnostic Neuroimaging

## 2016-09-17 ENCOUNTER — Encounter: Payer: Self-pay | Admitting: Family Medicine

## 2016-09-17 DIAGNOSIS — N61 Mastitis without abscess: Secondary | ICD-10-CM

## 2016-09-24 ENCOUNTER — Ambulatory Visit
Admission: RE | Admit: 2016-09-24 | Discharge: 2016-09-24 | Disposition: A | Discharge status: Home / Self Care | Payer: Medicare Other | Source: Ambulatory Visit | Attending: Family Medicine | Admitting: Family Medicine

## 2016-09-24 DIAGNOSIS — N6489 Other specified disorders of breast: Secondary | ICD-10-CM | POA: Diagnosis not present

## 2016-09-24 DIAGNOSIS — N611 Abscess of the breast and nipple: Secondary | ICD-10-CM

## 2016-09-24 DIAGNOSIS — N6452 Nipple discharge: Secondary | ICD-10-CM

## 2016-09-24 DIAGNOSIS — R922 Inconclusive mammogram: Secondary | ICD-10-CM | POA: Diagnosis not present

## 2016-09-27 ENCOUNTER — Encounter: Payer: Self-pay | Admitting: Family Medicine

## 2016-09-28 ENCOUNTER — Telehealth: Payer: Self-pay | Admitting: Family Medicine

## 2016-09-28 NOTE — Telephone Encounter (Signed)
[facility] calling.  Pt was seen at an Urgent Care with problem to right hand.  Right thumb trigger finger.  OK for MCD 6 visits/6 months.

## 2016-10-01 ENCOUNTER — Ambulatory Visit (INDEPENDENT_AMBULATORY_CARE_PROVIDER_SITE_OTHER): Payer: Medicare Other | Admitting: *Deleted

## 2016-10-01 DIAGNOSIS — I639 Cerebral infarction, unspecified: Secondary | ICD-10-CM

## 2016-10-01 NOTE — Progress Notes (Signed)
Carelink Summary Report / Loop Recorder 

## 2016-10-02 DIAGNOSIS — N6012 Diffuse cystic mastopathy of left breast: Secondary | ICD-10-CM | POA: Diagnosis not present

## 2016-10-02 DIAGNOSIS — M65311 Trigger thumb, right thumb: Secondary | ICD-10-CM | POA: Diagnosis not present

## 2016-10-05 ENCOUNTER — Telehealth: Payer: Self-pay

## 2016-10-05 NOTE — Telephone Encounter (Signed)
Receive incoming call from [facility] at [facility]. They stated a clearance letter was fax 10/02/2016 to Dr. Leonie Man. Pt needs surgery clearance. Rn gave fax number of [phone].

## 2016-10-08 ENCOUNTER — Encounter: Payer: Self-pay | Admitting: Neurology

## 2016-10-08 NOTE — Telephone Encounter (Signed)
Clearance letter fax to [facility] to [facility] [place]. LEtter fax twice and confirmed.

## 2016-10-09 ENCOUNTER — Ambulatory Visit: Payer: Self-pay | Admitting: General Surgery

## 2016-10-10 DIAGNOSIS — M65311 Trigger thumb, right thumb: Secondary | ICD-10-CM | POA: Diagnosis not present

## 2016-10-10 LAB — CUP PACEART REMOTE DEVICE CHECK
MDC IDC PG IMPLANT DT: 20161208
MDC IDC SESS DTM: 20171104030716

## 2016-10-10 NOTE — Progress Notes (Signed)
Carelink summary report received. Battery status OK. Normal device function. No new symptom episodes, brady, or pause episodes. No new AF episodes. 2 tachy- 1 w/ available ECG appears PAT/SVT. See ECG. Monthly summary reports and ROV/PRN

## 2016-10-25 ENCOUNTER — Other Ambulatory Visit: Payer: Self-pay | Admitting: Family Medicine

## 2016-10-26 ENCOUNTER — Other Ambulatory Visit: Payer: Self-pay | Admitting: Family Medicine

## 2016-10-26 NOTE — Telephone Encounter (Signed)
Ok to refill??  Last office visit 09/03/2016.  Last refill 06/06/2016, #1 refill.

## 2016-10-26 NOTE — Telephone Encounter (Signed)
Medication called to pharmacy. 

## 2016-10-26 NOTE — Telephone Encounter (Signed)
ok 

## 2016-10-30 ENCOUNTER — Other Ambulatory Visit: Payer: Self-pay | Admitting: Internal Medicine

## 2016-10-30 ENCOUNTER — Ambulatory Visit (INDEPENDENT_AMBULATORY_CARE_PROVIDER_SITE_OTHER): Payer: Medicare Other | Admitting: *Deleted

## 2016-10-30 DIAGNOSIS — I639 Cerebral infarction, unspecified: Secondary | ICD-10-CM

## 2016-11-01 NOTE — Progress Notes (Signed)
Carelink Summary Report / Loop Recorder 

## 2016-11-07 ENCOUNTER — Other Ambulatory Visit: Payer: Self-pay

## 2016-11-07 ENCOUNTER — Encounter (HOSPITAL_COMMUNITY): Payer: Self-pay

## 2016-11-07 ENCOUNTER — Encounter (HOSPITAL_COMMUNITY)
Admission: RE | Admit: 2016-11-07 | Discharge: 2016-11-07 | Disposition: A | Discharge status: Home / Self Care | Payer: Medicare Other | Source: Ambulatory Visit | Attending: General Surgery | Admitting: General Surgery

## 2016-11-07 DIAGNOSIS — N6452 Nipple discharge: Secondary | ICD-10-CM | POA: Insufficient documentation

## 2016-11-07 DIAGNOSIS — Z01812 Encounter for preprocedural laboratory examination: Secondary | ICD-10-CM | POA: Diagnosis not present

## 2016-11-07 DIAGNOSIS — Z01810 Encounter for preprocedural cardiovascular examination: Secondary | ICD-10-CM | POA: Diagnosis not present

## 2016-11-07 HISTORY — DX: Personal history of other diseases of the nervous system and sense organs: Z86.69

## 2016-11-07 HISTORY — DX: Fibromyalgia: M79.7

## 2016-11-07 HISTORY — DX: Anxiety disorder, unspecified: F41.9

## 2016-11-07 HISTORY — DX: Personal history of other diseases of the respiratory system: Z87.09

## 2016-11-07 HISTORY — DX: Nocturia: R35.1

## 2016-11-07 HISTORY — DX: Pain in unspecified joint: M25.50

## 2016-11-07 HISTORY — DX: Personal history of colon polyps, unspecified: Z86.0100

## 2016-11-07 HISTORY — DX: Personal history of colonic polyps: Z86.010

## 2016-11-07 HISTORY — DX: Hyperlipidemia, unspecified: E78.5

## 2016-11-07 LAB — BASIC METABOLIC PANEL
Anion gap: 7 (ref 5–15)
BUN: 12 mg/dL (ref 6–20)
CALCIUM: 9.1 mg/dL (ref 8.9–10.3)
CO2: 27 mmol/L (ref 22–32)
CREATININE: 0.87 mg/dL (ref 0.44–1.00)
Chloride: 104 mmol/L (ref 101–111)
GFR calc non Af Amer: >60 mL/min (ref >60)
Glucose, Bld: 128 mg/dL — ABNORMAL HIGH (ref 65–99)
Potassium: 3.4 mmol/L — ABNORMAL LOW (ref 3.5–5.1)
Sodium: 138 mmol/L (ref 135–145)

## 2016-11-07 LAB — CBC
HCT: 40 % (ref 36.0–46.0)
Hemoglobin: 13.6 g/dL (ref 12.0–15.0)
MCH: 31.3 pg (ref 26.0–34.0)
MCHC: 34 g/dL (ref 30.0–36.0)
MCV: 92 fL (ref 78.0–100.0)
PLATELETS: 299 10*3/uL (ref 150–400)
RBC: 4.35 MIL/uL (ref 3.87–5.11)
RDW: 14.5 % (ref 11.5–15.5)
WBC: 8.2 10*3/uL (ref 4.0–10.5)

## 2016-11-07 MED ORDER — CHLORHEXIDINE GLUCONATE CLOTH 2 % EX PADS: 6.0000 | MEDICATED_PAD | Freq: Once | CUTANEOUS | Status: DC

## 2016-11-07 NOTE — Progress Notes (Signed)
Cardiologist is Dr.Allred-last visit 6-8 months ago  Medical Md is Coleman  Echo report in epic from 2016  Stress test denies  Heart cath denies  EKG denies in past yr  CXR denies in past yr

## 2016-11-07 NOTE — Pre-Procedure Instructions (Signed)
Alicia Bolton  11/07/2016      [place], [place] - [address] Alicia Bolton [place] [address] Phone: [phone] Fax: [phone]    Your procedure is scheduled on Wed, Jan 17 @ 8:30 AM  Report to [facility] Admitting at 6:30 AM  Call this number if you have problems the morning of surgery:  [phone]   Remember:  Do not eat food or drink liquids after midnight.  Take these medicines the morning of surgery with A SIP OF WATER Clonidine(Catapres),Pain Pill(if needed),Valtrex(Valacyclovir),and Ativan(Lorazepam-if needed)   Do not wear jewelry, make-up or nail polish.  Do not wear lotions, powders,perfumes, or deoderant.  Do not shave 48 hours prior to surgery.    Do not bring valuables to the hospital.  [facility] is not responsible for any belongings or valuables.  Contacts, dentures or bridgework may not be worn into surgery.  Leave your suitcase in the car.  After surgery it may be brought to your room.  For patients admitted to the hospital, discharge time will be determined by your treatment team.  Patients discharged the day of surgery will not be allowed to drive home.   [facility]  Before surgery, you can play an important role.  Because skin is not sterile, your skin needs to be as free of germs as possible.  You can reduce the number of germs on you skin by washing with CHG (chlorahexidine gluconate) soap before surgery.  CHG is an antiseptic cleaner which kills germs and bonds with the skin to continue killing germs even after washing.  Please DO NOT use if you have an allergy to CHG or antibacterial soaps.  If your skin becomes reddened/irritated stop using the CHG and inform your nurse when you arrive at Short Stay.  Do not shave (including legs and underarms) for at least 48 hours prior to the first CHG shower.  You may shave your face.  Please  follow these instructions carefully:   1.  Shower with CHG Soap the night before surgery and the                                morning of Surgery.  2.  If you choose to wash your hair, wash your hair first as usual with your       normal shampoo.  3.  After you shampoo, rinse your hair and body thoroughly to remove the                      Shampoo.  4.  Use CHG as you would any other liquid soap.  You can apply chg directly       to the skin and wash gently with scrungie or a clean washcloth.  5.  Apply the CHG Soap to your body ONLY FROM THE NECK DOWN.        Do not use on open wounds or open sores.  Avoid contact with your eyes,       ears, mouth and genitals (private parts).  Wash genitals (private parts)       with your normal soap.  6.  Wash thoroughly, paying special attention to the area where your surgery        will be performed.  7.  Thoroughly rinse your body with warm water from the  neck down.  8.  DO NOT shower/wash with your normal soap after using and rinsing off       the CHG Soap.  9.  Pat yourself dry with a clean towel.            10.  Wear clean pajamas.            11.  Place clean sheets on your bed the night of your first shower and do not        sleep with pets.  Day of Surgery  Do not apply any lotions/deoderants the morning of surgery.  Please wear clean clothes to the hospital/surgery center.   Please read over the following fact sheets that you were given. Pain Booklet, Coughing and Deep Breathing and Surgical Site Infection Prevention

## 2016-11-08 ENCOUNTER — Encounter: Payer: Self-pay | Admitting: Family Medicine

## 2016-11-08 LAB — CUP PACEART REMOTE DEVICE CHECK
MDC IDC PG IMPLANT DT: 20161208
MDC IDC SESS DTM: 20171204033538

## 2016-11-08 NOTE — Progress Notes (Signed)
Anesthesia Chart Review:  Pt is a 61 year old female scheduled for L breast ductal system excision on 11/14/2016 with Jovita Kussmaul, MD.   - Cardiologist is Thompson Grayer, MD, last office visit 11/09/15 with Vick Frees, PA; f/u in 1 year recommended.  - PCP is Vic Blackbird, MD  PMH includes:  HTN, hyperlipidemia, stroke (09/2015)., loop recorder. Former smoker. BMI 21. S/p cervical decompression 10/25/14. S/p R knee arthroscopy 10/09/13.   Medications include: lipitor, clonidine, hctz  Preoperative labs reviewed.    EKG 11/07/16: NSR. Left axis deviation  TEE 10/05/15:  - Left ventricle: The cavity size was normal. Wall thickness was normal. Systolic function was normal. The estimated ejection fraction was in the range of 50% to 55%. - Aortic valve: No evidence of vegetation. - Mitral valve: There was mild regurgitation. - Left atrium: No evidence of thrombus in the atrial cavity or appendage. - Atrial septum: No defect or patent foramen ovale was identified. - Tricuspid valve: No evidence of vegetation.  If no changes, I anticipate pt can proceed with surgery as scheduled.   Willeen Cass, FNP-BC [facility] Short Stay Surgical Center/Anesthesiology Phone: [phone] 11/08/2016 3:05 PM

## 2016-11-14 ENCOUNTER — Ambulatory Visit (HOSPITAL_COMMUNITY)
Admission: RE | Admit: 2016-11-14 | Discharge: 2016-11-14 | Disposition: A | Discharge status: Home / Self Care | Payer: Medicare Other | Source: Ambulatory Visit | Attending: General Surgery | Admitting: General Surgery

## 2016-11-14 ENCOUNTER — Ambulatory Visit (HOSPITAL_COMMUNITY): Payer: Medicare Other | Admitting: Emergency Medicine

## 2016-11-14 ENCOUNTER — Ambulatory Visit (HOSPITAL_COMMUNITY): Payer: Medicare Other | Admitting: Certified Registered Nurse Anesthetist

## 2016-11-14 ENCOUNTER — Encounter (HOSPITAL_COMMUNITY): Payer: Self-pay | Admitting: *Deleted

## 2016-11-14 ENCOUNTER — Encounter (HOSPITAL_COMMUNITY)
Admission: RE | Disposition: A | Discharge status: Home / Self Care | Payer: Self-pay | Source: Ambulatory Visit | Attending: General Surgery

## 2016-11-14 DIAGNOSIS — Z8249 Family history of ischemic heart disease and other diseases of the circulatory system: Secondary | ICD-10-CM | POA: Insufficient documentation

## 2016-11-14 DIAGNOSIS — N6012 Diffuse cystic mastopathy of left breast: Secondary | ICD-10-CM | POA: Insufficient documentation

## 2016-11-14 DIAGNOSIS — Z86010 Personal history of colonic polyps: Secondary | ICD-10-CM | POA: Insufficient documentation

## 2016-11-14 DIAGNOSIS — Z8673 Personal history of transient ischemic attack (TIA), and cerebral infarction without residual deficits: Secondary | ICD-10-CM | POA: Insufficient documentation

## 2016-11-14 DIAGNOSIS — F419 Anxiety disorder, unspecified: Secondary | ICD-10-CM | POA: Insufficient documentation

## 2016-11-14 DIAGNOSIS — Z880 Allergy status to penicillin: Secondary | ICD-10-CM | POA: Insufficient documentation

## 2016-11-14 DIAGNOSIS — Z90710 Acquired absence of both cervix and uterus: Secondary | ICD-10-CM | POA: Insufficient documentation

## 2016-11-14 DIAGNOSIS — Z888 Allergy status to other drugs, medicaments and biological substances status: Secondary | ICD-10-CM | POA: Insufficient documentation

## 2016-11-14 DIAGNOSIS — Z833 Family history of diabetes mellitus: Secondary | ICD-10-CM | POA: Insufficient documentation

## 2016-11-14 DIAGNOSIS — I10 Essential (primary) hypertension: Secondary | ICD-10-CM | POA: Diagnosis not present

## 2016-11-14 DIAGNOSIS — G43909 Migraine, unspecified, not intractable, without status migrainosus: Secondary | ICD-10-CM | POA: Insufficient documentation

## 2016-11-14 DIAGNOSIS — Z87891 Personal history of nicotine dependence: Secondary | ICD-10-CM | POA: Insufficient documentation

## 2016-11-14 DIAGNOSIS — N63 Unspecified lump in unspecified breast: Secondary | ICD-10-CM | POA: Diagnosis not present

## 2016-11-14 DIAGNOSIS — Z820 Family history of epilepsy and other diseases of the nervous system: Secondary | ICD-10-CM | POA: Insufficient documentation

## 2016-11-14 DIAGNOSIS — M1990 Unspecified osteoarthritis, unspecified site: Secondary | ICD-10-CM | POA: Insufficient documentation

## 2016-11-14 DIAGNOSIS — N6452 Nipple discharge: Secondary | ICD-10-CM | POA: Insufficient documentation

## 2016-11-14 DIAGNOSIS — K8020 Calculus of gallbladder without cholecystitis without obstruction: Secondary | ICD-10-CM | POA: Insufficient documentation

## 2016-11-14 HISTORY — PX: BREAST DUCTAL SYSTEM EXCISION: SHX5242

## 2016-11-14 SURGERY — EXCISION DUCTAL SYSTEM BREAST
Anesthesia: General | Site: Breast | Laterality: Left

## 2016-11-14 MED ORDER — HYDROCODONE-ACETAMINOPHEN 5-325 MG PO TABS: 1.0000 | ORAL_TABLET | ORAL | 0 refills | Status: DC | PRN

## 2016-11-14 MED ORDER — SCOPOLAMINE 1 MG/3DAYS TD PT72
MEDICATED_PATCH | TRANSDERMAL | Status: DC | PRN
  Administered 2016-11-14: 1 via TRANSDERMAL

## 2016-11-14 MED ORDER — ONDANSETRON HCL 4 MG/2ML IJ SOLN
INTRAMUSCULAR | Status: DC | PRN
  Administered 2016-11-14: 4 mg via INTRAVENOUS

## 2016-11-14 MED ORDER — 0.9 % SODIUM CHLORIDE (POUR BTL) OPTIME
TOPICAL | Status: DC | PRN
  Administered 2016-11-14: 1000 mL

## 2016-11-14 MED ORDER — BUPIVACAINE HCL (PF) 0.25 % IJ SOLN
INTRAMUSCULAR | Status: AC
  Filled 2016-11-14: qty 30

## 2016-11-14 MED ORDER — HYDROMORPHONE HCL 1 MG/ML IJ SOLN
0.2500 mg | INTRAMUSCULAR | Status: DC | PRN
Start: 2016-11-14 — End: 2016-11-14
  Administered 2016-11-14: 0.5 mg via INTRAVENOUS

## 2016-11-14 MED ORDER — FENTANYL CITRATE (PF) 100 MCG/2ML IJ SOLN
INTRAMUSCULAR | Status: AC
  Filled 2016-11-14: qty 2

## 2016-11-14 MED ORDER — VANCOMYCIN HCL IN DEXTROSE 1-5 GM/200ML-% IV SOLN
1000.0000 mg | INTRAVENOUS | Status: AC
  Administered 2016-11-14: 1000 mg via INTRAVENOUS
  Filled 2016-11-14: qty 200

## 2016-11-14 MED ORDER — FENTANYL CITRATE (PF) 100 MCG/2ML IJ SOLN
INTRAMUSCULAR | Status: DC | PRN
  Administered 2016-11-14 (×3): 25 ug via INTRAVENOUS

## 2016-11-14 MED ORDER — HYDROMORPHONE HCL 1 MG/ML IJ SOLN
INTRAMUSCULAR | Status: AC
  Filled 2016-11-14: qty 0.5

## 2016-11-14 MED ORDER — LIDOCAINE HCL (CARDIAC) 20 MG/ML IV SOLN
INTRAVENOUS | Status: DC | PRN
  Administered 2016-11-14: 80 mg via INTRAVENOUS

## 2016-11-14 MED ORDER — VANCOMYCIN HCL IN DEXTROSE 1-5 GM/200ML-% IV SOLN
INTRAVENOUS | Status: AC
  Filled 2016-11-14: qty 200

## 2016-11-14 MED ORDER — PROPOFOL 10 MG/ML IV BOLUS
INTRAVENOUS | Status: AC
  Filled 2016-11-14: qty 40

## 2016-11-14 MED ORDER — BUPIVACAINE HCL (PF) 0.25 % IJ SOLN
INTRAMUSCULAR | Status: DC | PRN
  Administered 2016-11-14: 10 mL

## 2016-11-14 MED ORDER — PROPOFOL 10 MG/ML IV BOLUS
INTRAVENOUS | Status: DC | PRN
  Administered 2016-11-14: 200 mg via INTRAVENOUS

## 2016-11-14 MED ORDER — MIDAZOLAM HCL 5 MG/5ML IJ SOLN
INTRAMUSCULAR | Status: DC | PRN
  Administered 2016-11-14 (×2): 1 mg via INTRAVENOUS

## 2016-11-14 MED ORDER — MIDAZOLAM HCL 2 MG/2ML IJ SOLN
INTRAMUSCULAR | Status: AC
  Filled 2016-11-14: qty 2

## 2016-11-14 MED ORDER — LACTATED RINGERS IV SOLN
INTRAVENOUS | Status: DC | PRN
  Administered 2016-11-14: 08:00:00 via INTRAVENOUS

## 2016-11-14 SURGICAL SUPPLY — 38 items
ADH SKN CLS APL DERMABOND .7 (GAUZE/BANDAGES/DRESSINGS) ×1
CANISTER SUCTION 2500CC (MISCELLANEOUS) IMPLANT
CHLORAPREP W/TINT 26ML (MISCELLANEOUS) ×3 IMPLANT
CONT SPEC 4OZ CLIKSEAL STRL BL (MISCELLANEOUS) ×2 IMPLANT
COVER SURGICAL LIGHT HANDLE (MISCELLANEOUS) ×3 IMPLANT
DERMABOND ADVANCED (GAUZE/BANDAGES/DRESSINGS) ×2
DERMABOND ADVANCED .7 DNX12 (GAUZE/BANDAGES/DRESSINGS) ×1 IMPLANT
DEVICE DUBIN SPECIMEN MAMMOGRA (MISCELLANEOUS) IMPLANT
DRAPE CHEST BREAST 15X10 FENES (DRAPES) ×3 IMPLANT
DRAPE UTILITY XL STRL (DRAPES) ×4 IMPLANT
ELECT COATED BLADE 2.86 ST (ELECTRODE) ×3 IMPLANT
ELECT REM PT RETURN 9FT ADLT (ELECTROSURGICAL) ×3
ELECTRODE REM PT RTRN 9FT ADLT (ELECTROSURGICAL) ×1 IMPLANT
GAUZE SPONGE 4X4 16PLY XRAY LF (GAUZE/BANDAGES/DRESSINGS) ×3 IMPLANT
GLOVE BIO SURGEON STRL SZ7.5 (GLOVE) ×3 IMPLANT
GOWN STRL REUS W/ TWL LRG LVL3 (GOWN DISPOSABLE) ×2 IMPLANT
GOWN STRL REUS W/TWL LRG LVL3 (GOWN DISPOSABLE) ×6
KIT BASIN OR (CUSTOM PROCEDURE TRAY) ×3 IMPLANT
KIT MARKER MARGIN INK (KITS) IMPLANT
KIT ROOM TURNOVER OR (KITS) ×3 IMPLANT
LIGHT WAVEGUIDE WIDE FLAT (MISCELLANEOUS) IMPLANT
NDL HYPO 25GX1X1/2 BEV (NEEDLE) ×1 IMPLANT
NEEDLE HYPO 25GX1X1/2 BEV (NEEDLE) ×3 IMPLANT
NS IRRIG 1000ML POUR BTL (IV SOLUTION) ×3 IMPLANT
PACK SURGICAL SETUP 50X90 (CUSTOM PROCEDURE TRAY) ×3 IMPLANT
PAD ARMBOARD 7.5X6 YLW CONV (MISCELLANEOUS) ×6 IMPLANT
PENCIL BUTTON HOLSTER BLD 10FT (ELECTRODE) ×3 IMPLANT
STAPLER VISISTAT 35W (STAPLE) ×3 IMPLANT
SUT MON AB 4-0 PC3 18 (SUTURE) ×3 IMPLANT
SUT SILK 2 0 FS (SUTURE) ×2 IMPLANT
SUT VIC AB 3-0 SH 27 (SUTURE) ×3
SUT VIC AB 3-0 SH 27X BRD (SUTURE) ×1 IMPLANT
SYR CONTROL 10ML LL (SYRINGE) ×3 IMPLANT
TOWEL OR 17X24 6PK STRL BLUE (TOWEL DISPOSABLE) ×3 IMPLANT
TOWEL OR 17X26 10 PK STRL BLUE (TOWEL DISPOSABLE) ×1 IMPLANT
TUBE CONNECTING 12'X1/4 (SUCTIONS)
TUBE CONNECTING 12X1/4 (SUCTIONS) IMPLANT
YANKAUER SUCT BULB TIP NO VENT (SUCTIONS) IMPLANT

## 2016-11-14 NOTE — Interval H&P Note (Signed)
History and Physical Interval Note:  11/14/2016 8:18 AM  Alicia Bolton  has presented today for surgery, with the diagnosis of LEFT BREAST NIPPLE DISCHARGE  The various methods of treatment have been discussed with the patient and family. After consideration of risks, benefits and other options for treatment, the patient has consented to  Procedure(s): Helper (Left) as a surgical intervention .  The patient's history has been reviewed, patient examined, no change in status, stable for surgery.  I have reviewed the patient's chart and labs.  Questions were answered to the patient's satisfaction.     TOTH III,Rayelynn Loyal S

## 2016-11-14 NOTE — H&P (Signed)
Alicia Bolton  Location: [facility] Patient #: [id number] DOB: 11-07-1955 Single / Language: [name] / Race: Black or African American Female   History of Present Illness  The patient is a 61 year old female who presents with a complaint of Breast pain. We are asked to see the patient in consultation by Dr. Buelah Manis to evaluate her for recurrent mastitis of the left breast. The patient is a 61 year old black female who has had episodes of mastitis of the left breast in the last year. Each of these episodes has resolved with warm compresses and antibiotic therapy. Her most recent ultrasound showed no evidence of abscess but did show a dilated duct in the subareolar left breast in the 4 o'clock position. She is also having recurrent drainage from the nipple on that side. Her recent mammogram and ultrasound showed no evidence of malignancy.   Other Problems  Anxiety Disorder  Arthritis  Cerebrovascular Accident  Cholelithiasis  General anesthesia - complications  High blood pressure  Lump In Breast  Migraine Headache  Other disease, cancer, significant illness   Past Surgical History Breast Biopsy  Bilateral. Cesarean Section - 1  Colon Polyp Removal - Colonoscopy  Gallbladder Surgery - Laparoscopic  Hysterectomy (not due to cancer) - Partial  Knee Surgery  Right. Oral Surgery  Spinal Surgery - Neck   Diagnostic Studies History  Colonoscopy  5-10 years ago Mammogram  within last year Pap Smear  >5 years ago  Allergies  Floxin *FLUOROQUINOLONES*  Stomache Penicillin G Benzathine & Proc *PENICILLINS*  Rash.  Medication History  Lipitor (40MG  Tablet, Oral daily) Active. CloNIDine HCl (0.2MG  Tablet, Oral daily) Active. Ativan (0.5MG  Tablet, Oral daily) Active. Valtrex (500MG  Tablet, Oral as needed) Active. Medications Reconciled  Social History Caffeine use  Coffee, Tea. Illicit drug use  Remotely quit drug use. Tobacco use   Former smoker.  Family History  Diabetes Mellitus  Mother, Sister. Hypertension  Sister. Migraine Headache  Sister, Son. Seizure disorder  Brother.  Pregnancy / Birth History  Age at menarche  46 years. Gravida  2 Length (months) of breastfeeding  3-6 Maternal age  72-25 Para  1    Review of Systems  General Not Present- Appetite Loss, Chills, Fatigue, Fever, Night Sweats, Weight Gain and Weight Loss. Skin Not Present- Change in Wart/Mole, Dryness, Hives, Jaundice, New Lesions, Non-Healing Wounds, Rash and Ulcer. HEENT Present- Seasonal Allergies and Wears glasses/contact lenses. Not Present- Earache, Hearing Loss, Hoarseness, Nose Bleed, Oral Ulcers, Ringing in the Ears, Sinus Pain, Sore Throat, Visual Disturbances and Yellow Eyes. Breast Present- Breast Mass and Nipple Discharge. Not Present- Breast Pain and Skin Changes. Gastrointestinal Not Present- Abdominal Pain, Bloating, Bloody Stool, Change in Bowel Habits, Chronic diarrhea, Constipation, Difficulty Swallowing, Excessive gas, Gets full quickly at meals, Hemorrhoids, Indigestion, Nausea, Rectal Pain and Vomiting. Female Genitourinary Not Present- Frequency, Nocturia, Painful Urination, Pelvic Pain and Urgency. Musculoskeletal Present- Joint Pain, Joint Stiffness and Swelling of Extremities. Not Present- Back Pain, Muscle Pain and Muscle Weakness. Neurological Present- Headaches, Numbness, Tingling and Weakness. Not Present- Decreased Memory, Fainting, Seizures, Tremor and Trouble walking. Psychiatric Present- Anxiety. Not Present- Bipolar, Change in Sleep Pattern, Depression, Fearful and Frequent crying. Endocrine Not Present- Cold Intolerance, Excessive Hunger, Hair Changes, Heat Intolerance, Hot flashes and New Diabetes. Hematology Present- Blood Thinners. Not Present- Easy Bruising, Excessive bleeding, Gland problems, HIV and Persistent Infections.  Vitals Weight: 132.6 lb Height: 66in Body Surface Area: 1.68  m Body Mass Index: 21.4 kg/m  Temp.: 98.70F  Pulse: 107 (Regular)  BP: 124/72 (Sitting, Left Arm, Standard)       Physical Exam  General Mental Status-Alert. General Appearance-Consistent with stated age. Hydration-Well hydrated. Voice-Normal.  Head and Neck Head-normocephalic, atraumatic with no lesions or palpable masses. Trachea-midline. Thyroid Gland Characteristics - normal size and consistency.  Eye Eyeball - Bilateral-Extraocular movements intact. Sclera/Conjunctiva - Bilateral-No scleral icterus.  Chest and Lung Exam Chest and lung exam reveals -quiet, even and easy respiratory effort with no use of accessory muscles and on auscultation, normal breath sounds, no adventitious sounds and normal vocal resonance. Inspection Chest Wall - Normal. Back - normal.  Breast Note: There is no palpable mass in either breast. There is no palpable axillary, supraclavicular, or cervical lymphadenopathy. There is reproducible drainage from the left nipple that is white and green and thick in nature   Cardiovascular Cardiovascular examination reveals -normal heart sounds, regular rate and rhythm with no murmurs and normal pedal pulses bilaterally.  Abdomen Inspection Inspection of the abdomen reveals - No Hernias. Skin - Scar - no surgical scars. Palpation/Percussion Palpation and Percussion of the abdomen reveal - Soft, Non Tender, No Rebound tenderness, No Rigidity (guarding) and No hepatosplenomegaly. Auscultation Auscultation of the abdomen reveals - Bowel sounds normal.  Neurologic Neurologic evaluation reveals -alert and oriented x 3 with no impairment of recent or remote memory. Mental Status-Normal.  Musculoskeletal Normal Exam - Left-Upper Extremity Strength Normal and Lower Extremity Strength Normal. Normal Exam - Right-Upper Extremity Strength Normal and Lower Extremity Strength Normal.  Lymphatic Head & Neck  General  Head & Neck Lymphatics: Bilateral - Description - Normal. Axillary  General Axillary Region: Bilateral - Description - Normal. Tenderness - Non Tender. Femoral & Inguinal  Generalized Femoral & Inguinal Lymphatics: Bilateral - Description - Normal. Tenderness - Non Tender.    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 By ultrasound she has a dilated duct in the 4 o'clock position of the subareolar left breast. She is having significant whitish green drainage from the nipple. I suspect that this dilated duct is the source of the recurring mastitis. Since we seemed to be able to reproduce the drainage from the nipple I suspect we will be able to perform a subareolar duct excision and to localize the duct to be excised with a small silver probe through the draining duct. I have discussed with her in detail the risks and benefits of the operation to do this as well as some of the technical aspects and she understands and wishes to proceed. We will get clearance from her neurologist Dr. Leonie Man prior to scheduling surgery

## 2016-11-14 NOTE — Anesthesia Postprocedure Evaluation (Signed)
Anesthesia Post Note  Patient: Alicia Bolton  Procedure(s) Performed: Procedure(s) (LRB): EXCISION DUCTAL SYSTEM LEFT BREAST (Left)  Patient location during evaluation: PACU Anesthesia Type: General Level of consciousness: awake Pain management: pain level controlled Vital Signs Assessment: post-procedure vital signs reviewed and stable Cardiovascular status: stable Anesthetic complications: no       Last Vitals:  Vitals:   11/14/16 1050 11/14/16 1103  BP:  (!) 143/95  Pulse:  69  Resp:  16  Temp: 36.5 C     Last Pain:  Vitals:   11/14/16 1103  TempSrc:   PainSc: 0-No pain                 Allexa Acoff

## 2016-11-14 NOTE — Op Note (Signed)
11/14/2016  10:06 AM  PATIENT:  Alicia Bolton  61 y.o. female  PRE-OPERATIVE DIAGNOSIS:  LEFT BREAST NIPPLE DISCHARGE  POST-OPERATIVE DIAGNOSIS:  LEFT BREAST NIPPLE DISCHARGE  PROCEDURE:  Procedure(s): EXCISION DUCTAL SYSTEM LEFT BREAST (Left)  SURGEON:  Surgeon(s) and Role:    * Jovita Kussmaul, MD - Primary  PHYSICIAN ASSISTANT:   ASSISTANTS: none   ANESTHESIA:   local and general  EBL:  Total I/O In: 500 [I.V.:500] Out: -   BLOOD ADMINISTERED:none  DRAINS: none   LOCAL MEDICATIONS USED:  MARCAINE     SPECIMEN:  Source of Specimen:  left breast subareolar duct tissue  DISPOSITION OF SPECIMEN:  PATHOLOGY  COUNTS:  YES  TOURNIQUET:  * No tourniquets in log *  DICTATION: .Dragon Dictation   After informed consent was obtained the patient was brought to the operating room and placed in the supine position on the operating room table. After adequate induction of general anesthesia the patient's left breast was prepped with ChloraPrep, allowed to dry, and draped in usual sterile manner. An appropriate timeout was performed. The dilated duct was in the 4:00 position. The area around this was infiltrated with quarter percent Marcaine. A curvilinear incision was made along the lower outer edge of the areola with a 15 blade knife. The incision was carried through the skin and subcutaneous tissue sharply with electrocautery. The duct with the discharge was then probed with a small silver probe. The corresponding area of ductal tissue beneath the areola and nipple was excised sharply with electrocautery. Once the specimen was removed it was oriented with the short stitch on the superior surface and the long stitch on the lateral surface. The specimen was then sent to pathology for further evaluation. Hemostasis was achieved using the Bovie electrocautery. The wound was irrigated with saline and infiltrated with more quarter percent Marcaine. The deep layer of the wound was then closed  with layers of interrupted 3-0 Vicryl stitches. The skin was then closed with interrupted 4-0 Monocryl subcuticular stitches. Dermabond dressings were applied. The patient tolerated the procedure well. At the end of the case all needle sponge and instrument counts were correct. The patient was then awakened and taken to recovery in stable condition.  PLAN OF CARE: Discharge to home after PACU  PATIENT DISPOSITION:  PACU - hemodynamically stable.   Delay start of Pharmacological VTE agent (>24hrs) due to surgical blood loss or risk of bleeding: not applicable

## 2016-11-14 NOTE — Anesthesia Procedure Notes (Signed)
Procedure Name: LMA Insertion Performed by: Tressia Miners LEFFEW Pre-anesthesia Checklist: Patient identified, Emergency Drugs available, Suction available and Patient being monitored Patient Re-evaluated:Patient Re-evaluated prior to inductionOxygen Delivery Method: Circle System Utilized Preoxygenation: Pre-oxygenation with 100% oxygen Intubation Type: IV induction Ventilation: Mask ventilation without difficulty LMA: LMA inserted LMA Size: 4.0 Number of attempts: 1 Airway Equipment and Method: Bite block Placement Confirmation: positive ETCO2 Tube secured with: Tape Dental Injury: Teeth and Oropharynx as per pre-operative assessment

## 2016-11-14 NOTE — Anesthesia Preprocedure Evaluation (Addendum)
Anesthesia Evaluation  Patient identified by MRN, date of birth, ID band Patient awake    Reviewed: Allergy & Precautions, NPO status , reviewed documented beta blocker date and time   Airway Mallampati: II  TM Distance: >3 FB     Dental   Pulmonary former smoker,    breath sounds clear to auscultation       Cardiovascular hypertension,  Rhythm:Regular Rate:Normal     Neuro/Psych  Headaches,  Neuromuscular disease    GI/Hepatic negative GI ROS, Neg liver ROS,   Endo/Other    Renal/GU negative Renal ROS     Musculoskeletal  (+) Arthritis , Fibromyalgia -  Abdominal   Peds  Hematology   Anesthesia Other Findings   Reproductive/Obstetrics                             Anesthesia Physical Anesthesia Plan  ASA: III  Anesthesia Plan: General   Post-op Pain Management:    Induction: Intravenous  Airway Management Planned: LMA  Additional Equipment:   Intra-op Plan:   Post-operative Plan: Extubation in OR  Informed Consent: I have reviewed the patients History and Physical, chart, labs and discussed the procedure including the risks, benefits and alternatives for the proposed anesthesia with the patient or authorized representative who has indicated his/her understanding and acceptance.   Dental advisory given  Plan Discussed with: CRNA  Anesthesia Plan Comments:        Anesthesia Quick Evaluation

## 2016-11-14 NOTE — Transfer of Care (Signed)
Immediate Anesthesia Transfer of Care Note  Patient: Alicia Bolton  Procedure(s) Performed: Procedure(s): EXCISION DUCTAL SYSTEM LEFT BREAST (Left)  Patient Location: PACU  Anesthesia Type:General  Level of Consciousness: awake, alert , oriented, patient cooperative and responds to stimulation  Airway & Oxygen Therapy: Patient Spontanous Breathing and Patient connected to face mask oxygen  Post-op Assessment: Report given to RN, Post -op Vital signs reviewed and stable and Patient moving all extremities X 4  Post vital signs: Reviewed and stable  Last Vitals:  Vitals:   11/14/16 0641 11/14/16 1014  BP: 123/83   Pulse: 75   Resp: 20   Temp: 36.6 C (P) 36.1 C    Last Pain:  Vitals:   11/14/16 0641  TempSrc: Oral      Patients Stated Pain Goal: 7 ([id number])  Complications: No apparent anesthesia complications

## 2016-11-15 ENCOUNTER — Encounter (HOSPITAL_COMMUNITY): Payer: Self-pay | Admitting: General Surgery

## 2016-11-19 NOTE — Addendum Note (Signed)
Addendum  created 11/19/16 2313 by Belinda Block, MD   Sign clinical note

## 2016-11-29 ENCOUNTER — Ambulatory Visit (INDEPENDENT_AMBULATORY_CARE_PROVIDER_SITE_OTHER): Payer: Medicare Other | Admitting: *Deleted

## 2016-11-29 DIAGNOSIS — I639 Cerebral infarction, unspecified: Secondary | ICD-10-CM | POA: Diagnosis not present

## 2016-11-30 ENCOUNTER — Encounter: Payer: Self-pay | Admitting: Family Medicine

## 2016-11-30 ENCOUNTER — Ambulatory Visit (INDEPENDENT_AMBULATORY_CARE_PROVIDER_SITE_OTHER): Payer: Medicare Other | Admitting: Family Medicine

## 2016-11-30 VITALS — BP 102/68 | HR 64 | Temp 98.1°F | Resp 14 | Ht 66.0 in | Wt 127.0 lb

## 2016-11-30 DIAGNOSIS — I10 Essential (primary) hypertension: Secondary | ICD-10-CM | POA: Diagnosis not present

## 2016-11-30 DIAGNOSIS — I639 Cerebral infarction, unspecified: Secondary | ICD-10-CM | POA: Diagnosis not present

## 2016-11-30 DIAGNOSIS — M5441 Lumbago with sciatica, right side: Secondary | ICD-10-CM

## 2016-11-30 MED ORDER — METHYLPREDNISOLONE 4 MG PO TBPK: ORAL_TABLET | ORAL | 0 refills | Status: DC

## 2016-11-30 MED ORDER — HYDROCODONE-ACETAMINOPHEN 5-325 MG PO TABS: 1.0000 | ORAL_TABLET | ORAL | 0 refills | Status: DC | PRN

## 2016-11-30 MED ORDER — CYCLOBENZAPRINE HCL 10 MG PO TABS: 10.0000 mg | ORAL_TABLET | Freq: Three times a day (TID) | ORAL | 0 refills | Status: DC | PRN

## 2016-11-30 NOTE — Patient Instructions (Addendum)
Decrease clonidine to once a day  Use steroid pack, muscle relaxer and pain medication F/U  4 MONTHS

## 2016-11-30 NOTE — Progress Notes (Signed)
   Subjective:    Patient ID: Alicia Bolton, female    DOB: 04/16/1956, 61 y.o.   MRN: [id number]  Patient presents for Pain (x1 week- lower right sided back pain radiating down R hip and leg)   Patient here with right-sided back pain radiating to her hip and leg. 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 Saintclair Halsted. Was at a basketball game 1 week ago, was siting on bleachers when her pain started. She's had shooting pains down her right leg on and off. She's tried Epsom salt soaks she is taking Tylenol she cannot take NSAIDs secondary to her study drug for her stroke. She denies any change in bowel or bladder. Occasionally had some tingling in her toes but this is not persistent. She has been using her cane to help steady herself because of the pain.    Review Of Systems:  GEN- denies fatigue, fever, weight loss,weakness, recent illness HEENT- denies eye drainage, change in vision, nasal discharge, CVS- denies chest pain, palpitations RESP- denies SOB, cough, wheeze ABD- denies N/V, change in stools, abd pain GU- denies dysuria, hematuria, dribbling, incontinence MSK- + joint pain, muscle aches, injury Neuro- denies headache, dizziness, syncope, seizure activity       Objective:    BP 102/68 (BP Location: Left Arm, Patient Position: Sitting, Cuff Size: Normal)   Pulse 64   Temp 98.1 F (36.7 C) (Oral)   Resp 14   Ht 5\' 6"  (1.676 m)   Wt 127 lb (57.6 kg)   SpO2 98%   BMI 20.50 kg/m  GEN- NAD, alert and oriented x3  BP recheck 106/68 CVS- RRR, no murmur RESP-CTAB MSK- TTP Right paraspinals into buttock, SLR, fair ROM Spine decreased due to pain, good ROM HIPS/KNEES Neuro- sensation grossly in tact, DTR symmettic, motor equal bilat, antalgic gait walking with cane  EXT- No edema Pulses- Radial, DP- 2+        Assessment & Plan:      Problem List Items Addressed This Visit    HTN (hypertension)    Her blood pressure seems to be  overtreated on that and decrease her down to 0.2 mg once a day       Other Visit Diagnoses    Acute right-sided back pain with sciatica    -  Primary   Acute pain with sciatica. Medrol dosepak, muscle relaxer, refilled her pain medication. Hold on imaging, no red flags    Relevant Medications   methylPREDNISolone (MEDROL DOSEPAK) 4 MG TBPK tablet   cyclobenzaprine (FLEXERIL) 10 MG tablet   HYDROcodone-acetaminophen (NORCO/VICODIN) 5-325 MG tablet      Note: This dictation was prepared with Dragon dictation along with smaller phrase technology. Any transcriptional errors that result from this process are unintentional.

## 2016-11-30 NOTE — Assessment & Plan Note (Signed)
Her blood pressure seems to be overtreated on that and decrease her down to 0.2 mg once a day

## 2016-12-03 NOTE — Progress Notes (Signed)
Carelink Summary Report / Loop Recorder 

## 2016-12-04 ENCOUNTER — Other Ambulatory Visit: Payer: Self-pay | Admitting: Internal Medicine

## 2016-12-05 ENCOUNTER — Emergency Department (HOSPITAL_COMMUNITY)
Admission: EM | Admit: 2016-12-05 | Discharge: 2016-12-05 | Disposition: A | Discharge status: Home / Self Care | Payer: Medicare Other | Attending: Emergency Medicine | Admitting: Emergency Medicine

## 2016-12-05 ENCOUNTER — Encounter (HOSPITAL_COMMUNITY): Payer: Self-pay | Admitting: Emergency Medicine

## 2016-12-05 DIAGNOSIS — Y92410 Unspecified street and highway as the place of occurrence of the external cause: Secondary | ICD-10-CM | POA: Insufficient documentation

## 2016-12-05 DIAGNOSIS — M542 Cervicalgia: Secondary | ICD-10-CM | POA: Diagnosis present

## 2016-12-05 DIAGNOSIS — Z87891 Personal history of nicotine dependence: Secondary | ICD-10-CM | POA: Insufficient documentation

## 2016-12-05 DIAGNOSIS — M62838 Other muscle spasm: Secondary | ICD-10-CM | POA: Diagnosis not present

## 2016-12-05 DIAGNOSIS — Z8673 Personal history of transient ischemic attack (TIA), and cerebral infarction without residual deficits: Secondary | ICD-10-CM | POA: Insufficient documentation

## 2016-12-05 DIAGNOSIS — Z79899 Other long term (current) drug therapy: Secondary | ICD-10-CM | POA: Insufficient documentation

## 2016-12-05 DIAGNOSIS — M436 Torticollis: Secondary | ICD-10-CM | POA: Diagnosis not present

## 2016-12-05 DIAGNOSIS — Y999 Unspecified external cause status: Secondary | ICD-10-CM | POA: Diagnosis not present

## 2016-12-05 DIAGNOSIS — Y939 Activity, unspecified: Secondary | ICD-10-CM | POA: Insufficient documentation

## 2016-12-05 DIAGNOSIS — I10 Essential (primary) hypertension: Secondary | ICD-10-CM | POA: Insufficient documentation

## 2016-12-05 MED ORDER — NAPROXEN 500 MG PO TABS: 500.0000 mg | ORAL_TABLET | Freq: Two times a day (BID) | ORAL | 0 refills | Status: DC

## 2016-12-05 MED ORDER — METHOCARBAMOL 500 MG PO TABS: 500.0000 mg | ORAL_TABLET | Freq: Four times a day (QID) | ORAL | 0 refills | Status: DC | PRN

## 2016-12-05 NOTE — Discharge Instructions (Signed)
Your muscle aches are most likely due to muscle spasms and tightness.  We will treat this with a muscle relaxer, Robaxin, and naproxen, and anti-inflammatory and pain medication. Please take these medications as prescribed. Rest for the next 2 days. On day 3 units may start doing light neck stretches and massage. A heating pad 3-4 times a day may also decreased muscle tightness.  As we discussed we were unable to completely rule out a brain injury today without a CT scan. You are at a higher risk of brain injury given the fact that you may or may not be on an anticoagulant or aspirin. Please watch out for symptoms including severe headache, nausea, vomiting, blurred vision, weakness in your extremities and return immediately if he experienced these.
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
## 2016-12-05 NOTE — ED Notes (Signed)
Bed: WTR5 Expected date:  Expected time:  Means of arrival:  Comments: 

## 2016-12-07 ENCOUNTER — Encounter: Payer: Self-pay | Admitting: Family Medicine

## 2016-12-10 ENCOUNTER — Ambulatory Visit (HOSPITAL_COMMUNITY): Admit: 2016-12-10 | Payer: Medicare Other | Admitting: Gastroenterology

## 2016-12-10 ENCOUNTER — Encounter (HOSPITAL_COMMUNITY): Payer: Self-pay

## 2016-12-10 LAB — CUP PACEART REMOTE DEVICE CHECK
Date Time Interrogation Session: 20180103043858
MDC IDC PG IMPLANT DT: 20161208

## 2016-12-10 SURGERY — COLONOSCOPY WITH PROPOFOL
Anesthesia: Monitor Anesthesia Care

## 2016-12-12 ENCOUNTER — Ambulatory Visit (INDEPENDENT_AMBULATORY_CARE_PROVIDER_SITE_OTHER): Payer: Medicare Other | Admitting: Family Medicine

## 2016-12-12 ENCOUNTER — Encounter: Payer: Self-pay | Admitting: Family Medicine

## 2016-12-12 VITALS — BP 106/74 | HR 68 | Temp 98.2°F | Resp 14 | Ht 66.0 in | Wt 127.0 lb

## 2016-12-12 DIAGNOSIS — M5441 Lumbago with sciatica, right side: Secondary | ICD-10-CM

## 2016-12-12 DIAGNOSIS — M5442 Lumbago with sciatica, left side: Secondary | ICD-10-CM | POA: Diagnosis not present

## 2016-12-12 DIAGNOSIS — I639 Cerebral infarction, unspecified: Secondary | ICD-10-CM

## 2016-12-12 DIAGNOSIS — R202 Paresthesia of skin: Secondary | ICD-10-CM
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
## 2016-12-12 NOTE — Patient Instructions (Signed)
F/U pending results   

## 2016-12-13 ENCOUNTER — Ambulatory Visit
Admission: RE | Admit: 2016-12-13 | Discharge: 2016-12-13 | Disposition: A | Discharge status: Home / Self Care | Payer: Medicare Other | Source: Ambulatory Visit | Attending: Family Medicine | Admitting: Family Medicine

## 2016-12-13 DIAGNOSIS — M545 Low back pain: Secondary | ICD-10-CM | POA: Diagnosis not present

## 2016-12-13 DIAGNOSIS — M5442 Lumbago with sciatica, left side: Secondary | ICD-10-CM

## 2016-12-13 DIAGNOSIS — R202 Paresthesia of skin: Secondary | ICD-10-CM

## 2016-12-13 DIAGNOSIS — M5441 Lumbago with sciatica, right side: Secondary | ICD-10-CM

## 2016-12-14 ENCOUNTER — Encounter: Payer: Self-pay | Admitting: Family Medicine

## 2016-12-14 DIAGNOSIS — M5441 Lumbago with sciatica, right side: Secondary | ICD-10-CM

## 2016-12-14 DIAGNOSIS — M5442 Lumbago with sciatica, left side: Principal | ICD-10-CM

## 2016-12-14 IMAGING — CT CT ANGIO HEAD
1 of 10 series · 1 of 33 positions shown · IV contrast (omnipaque)
Comparison: None.

CT head October 02, 2015

CLINICAL DATA: Code stroke yesterday, acute onset bladder
incontinence and LEFT-sided weakness, LEFT facial droop. Received
tPA. History migraines.

EXAM:
CT ANGIOGRAPHY HEAD AND NECK
TECHNIQUE: Multidetector CT imaging of the head and neck was performed using
the standard protocol during bolus administration of intravenous
contrast. Multiplanar CT image reconstructions and MIPs were
obtained to evaluate the vascular anatomy. Carotid stenosis
measurements (when applicable) are obtained utilizing NASCET
criteria, using the distal internal carotid diameter as the
denominator.
CONTRAST:  50mL OMNIPAQUE IOHEXOL 350 MG/ML SOLN

[Series 200: locator · axial · 0.33mm/px · 1 of 1 slices shown]
[im 1/1  soft-tissue]
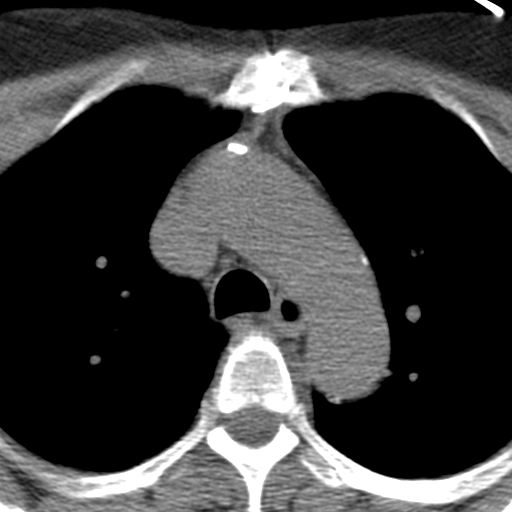

[1 of 33 positions shown; findings below may reference images not displayed]

FINDINGS: CT HEAD

No abnormal intracranial enhancement.

CTA NECK

Aortic arch: Normal appearance of the thoracic arch, normal branch
pattern. Mild calcific atherosclerosis. The origins of the
innominate, left Common carotid artery and subclavian artery are
widely patent.

Right carotid system: Common carotid artery is widely patent,
coursing in a straight line fashion. Normal appearance of the
carotid bifurcation without hemodynamically significant stenosis by
NASCET criteria. Normal appearance of the included internal carotid
artery, developmentally dominant.

Left carotid system: Common carotid artery is widely patent,
coursing in a straight line fashion. Normal appearance of the
carotid bifurcation without hemodynamically significant stenosis by
NASCET criteria. Normal appearance of the included internal carotid
artery.

Vertebral arteries:Codominant vertebral artery's. Normal appearance
of the vertebral arteries, which appear widely patent.

Skeleton: No acute osseous process though bone windows have not been
submitted. Status post median sternotomy. Status post CE the re-
through C5 ACDF, partially incorporated interbody fusion material.
Intact well-seated hardware. Solid C6-7 interbody fusion, status
post removal of ACDF hardware. Poor dentition, multiple dental
caries.

Other neck: Soft tissues of the neck are nonacute though, not
tailored for evaluation. Surgical clips appearing mediastinal and

CTA HEAD

Anterior circulation: Normal appearance of the cervical internal
carotid arteries, petrous, cavernous and supra clinoid internal
carotid arteries. Widely patent anterior communicating artery.
Bilateral anterior cerebral arteries predominately arise from RIGHT
A1-2 junction with diminutive LEFT A1 segment. Dolicoectatic
intracranial vessels.

Posterior circulation: Widely patent vertebral arteries,
vertebrobasilar junction and basilar artery, as well as main branch
vessels. Dolicoectatic intracranial vessels. Normal appearance of
the posterior cerebral arteries.

No large vessel occlusion, hemodynamically significant stenosis,
dissection, luminal irregularity, contrast extravasation or aneurysm
within the anterior nor posterior circulation.
IMPRESSION: CT HEAD:  No abnormal intracranial enhancement.

CTA NECK: No hemodynamically significant stenosis or acute vascular
process.

CTA HEAD: No acute large vessel occlusion or high-grade stenosis.

Dolicoectatic intracranial vessels most commonly seen with chronic
hypertension, less likely HIV.

## 2016-12-17 MED ORDER — METHYLPREDNISOLONE 4 MG PO TBPK: ORAL_TABLET | ORAL | 0 refills | Status: DC

## 2016-12-18 ENCOUNTER — Other Ambulatory Visit: Payer: Self-pay | Admitting: *Deleted

## 2016-12-18 ENCOUNTER — Encounter: Payer: Self-pay | Admitting: Family Medicine

## 2016-12-18 DIAGNOSIS — M5136 Other intervertebral disc degeneration, lumbar region: Secondary | ICD-10-CM

## 2016-12-20 NOTE — Addendum Note (Signed)
Addended by: Olena Mater on: 12/20/2016 09:16 AM   Modules accepted: Orders

## 2016-12-26 DIAGNOSIS — M545 Low back pain: Secondary | ICD-10-CM | POA: Diagnosis not present

## 2016-12-26 DIAGNOSIS — M79604 Pain in right leg: Secondary | ICD-10-CM | POA: Diagnosis not present

## 2016-12-26 DIAGNOSIS — M79605 Pain in left leg: Secondary | ICD-10-CM | POA: Diagnosis not present

## 2016-12-28 LAB — CUP PACEART REMOTE DEVICE CHECK
Date Time Interrogation Session: 20180202044007
MDC IDC PG IMPLANT DT: 20161208

## 2016-12-31 ENCOUNTER — Ambulatory Visit (INDEPENDENT_AMBULATORY_CARE_PROVIDER_SITE_OTHER): Payer: Medicare Other | Admitting: *Deleted

## 2016-12-31 DIAGNOSIS — M545 Low back pain: Secondary | ICD-10-CM | POA: Diagnosis not present

## 2016-12-31 DIAGNOSIS — M79604 Pain in right leg: Secondary | ICD-10-CM | POA: Diagnosis not present

## 2016-12-31 DIAGNOSIS — I639 Cerebral infarction, unspecified: Secondary | ICD-10-CM

## 2016-12-31 DIAGNOSIS — M79605 Pain in left leg: Secondary | ICD-10-CM | POA: Diagnosis not present

## 2016-12-31 NOTE — Progress Notes (Signed)
Carelink Summary Report / Loop Recorder 

## 2017-01-02 DIAGNOSIS — M79604 Pain in right leg: Secondary | ICD-10-CM | POA: Diagnosis not present

## 2017-01-02 DIAGNOSIS — M545 Low back pain: Secondary | ICD-10-CM | POA: Diagnosis not present

## 2017-01-02 DIAGNOSIS — M79605 Pain in left leg: Secondary | ICD-10-CM | POA: Diagnosis not present

## 2017-01-03 ENCOUNTER — Encounter: Payer: Self-pay | Admitting: Family Medicine

## 2017-01-03 ENCOUNTER — Encounter (HOSPITAL_COMMUNITY): Payer: Self-pay

## 2017-01-03 ENCOUNTER — Ambulatory Visit (HOSPITAL_COMMUNITY): Admit: 2017-01-03 | Payer: Medicare Other | Admitting: Gastroenterology

## 2017-01-03 ENCOUNTER — Telehealth: Payer: Self-pay | Admitting: Internal Medicine

## 2017-01-03 SURGERY — COLONOSCOPY WITH PROPOFOL
Anesthesia: Monitor Anesthesia Care

## 2017-01-03 NOTE — Telephone Encounter (Signed)
New message     Pt wants to know what the test results mean when it says Tachycardia event?

## 2017-01-07 NOTE — Telephone Encounter (Signed)
Spoke with patient.  Explained that her "tachy" episodes have been noted, but are not considered dangerous or worrisome unless she is having symptoms or issues with these episodes.  Patient denies palpitations, ShOB, dizziness, or other symptoms and is appreciative of explanation.  She agrees to call if she develops any of these symptoms, but otherwise we will continue to monitor her ILR remotely.  Patient verbalizes understanding and appreciation.  She denies additional questions or concerns at this time.

## 2017-01-08 ENCOUNTER — Other Ambulatory Visit: Payer: Self-pay | Admitting: Family Medicine

## 2017-01-10 DIAGNOSIS — M545 Low back pain: Secondary | ICD-10-CM | POA: Diagnosis not present

## 2017-01-10 DIAGNOSIS — M79604 Pain in right leg: Secondary | ICD-10-CM | POA: Diagnosis not present

## 2017-01-10 DIAGNOSIS — M79605 Pain in left leg: Secondary | ICD-10-CM | POA: Diagnosis not present

## 2017-01-12 ENCOUNTER — Encounter: Payer: Self-pay | Admitting: Family Medicine

## 2017-01-12 LAB — CUP PACEART REMOTE DEVICE CHECK
Date Time Interrogation Session: 20180304063822
MDC IDC PG IMPLANT DT: 20161208

## 2017-01-12 NOTE — Progress Notes (Signed)
Carelink summary report received. Battery status OK. Normal device function. No new symptom episodes, tachy episodes, brady, or pause episodes. No new AF episodes. Monthly summary reports and ROV/PRN 

## 2017-01-15 DIAGNOSIS — M545 Low back pain: Secondary | ICD-10-CM | POA: Diagnosis not present

## 2017-01-15 DIAGNOSIS — M79604 Pain in right leg: Secondary | ICD-10-CM | POA: Diagnosis not present

## 2017-01-15 DIAGNOSIS — M79605 Pain in left leg: Secondary | ICD-10-CM | POA: Diagnosis not present

## 2017-01-17 DIAGNOSIS — M79604 Pain in right leg: Secondary | ICD-10-CM | POA: Diagnosis not present

## 2017-01-17 DIAGNOSIS — M545 Low back pain: Secondary | ICD-10-CM | POA: Diagnosis not present

## 2017-01-17 DIAGNOSIS — M79605 Pain in left leg: Secondary | ICD-10-CM | POA: Diagnosis not present

## 2017-01-22 DIAGNOSIS — M79604 Pain in right leg: Secondary | ICD-10-CM | POA: Diagnosis not present

## 2017-01-22 DIAGNOSIS — M545 Low back pain: Secondary | ICD-10-CM | POA: Diagnosis not present

## 2017-01-22 DIAGNOSIS — M79605 Pain in left leg: Secondary | ICD-10-CM | POA: Diagnosis not present

## 2017-01-23 ENCOUNTER — Other Ambulatory Visit: Payer: Self-pay | Admitting: Family Medicine

## 2017-01-23 DIAGNOSIS — I10 Essential (primary) hypertension: Secondary | ICD-10-CM

## 2017-01-24 DIAGNOSIS — M79604 Pain in right leg: Secondary | ICD-10-CM | POA: Diagnosis not present

## 2017-01-24 DIAGNOSIS — M79605 Pain in left leg: Secondary | ICD-10-CM | POA: Diagnosis not present

## 2017-01-24 DIAGNOSIS — M545 Low back pain: Secondary | ICD-10-CM | POA: Diagnosis not present

## 2017-01-29 ENCOUNTER — Ambulatory Visit (INDEPENDENT_AMBULATORY_CARE_PROVIDER_SITE_OTHER): Payer: Medicare Other | Admitting: *Deleted

## 2017-01-29 DIAGNOSIS — M79605 Pain in left leg: Secondary | ICD-10-CM | POA: Diagnosis not present

## 2017-01-29 DIAGNOSIS — M545 Low back pain: Secondary | ICD-10-CM | POA: Diagnosis not present

## 2017-01-29 DIAGNOSIS — M79604 Pain in right leg: Secondary | ICD-10-CM | POA: Diagnosis not present

## 2017-01-29 DIAGNOSIS — I639 Cerebral infarction, unspecified: Secondary | ICD-10-CM

## 2017-01-29 NOTE — Progress Notes (Signed)
Carelink Summary Report / Loop Recorder 

## 2017-02-04 LAB — CUP PACEART REMOTE DEVICE CHECK
Implantable Pulse Generator Implant Date: 20161208
MDC IDC SESS DTM: 20180403064005

## 2017-02-04 NOTE — Progress Notes (Signed)
Carelink summary report received. Battery status OK. Normal device function. No new symptom episodes, tachy episodes, brady, or pause episodes. No new AF episodes. Monthly summary reports and ROV/PRN 

## 2017-02-05 DIAGNOSIS — M545 Low back pain: Secondary | ICD-10-CM | POA: Diagnosis not present

## 2017-02-05 DIAGNOSIS — M79604 Pain in right leg: Secondary | ICD-10-CM | POA: Diagnosis not present

## 2017-02-05 DIAGNOSIS — M79605 Pain in left leg: Secondary | ICD-10-CM | POA: Diagnosis not present

## 2017-02-14 DIAGNOSIS — M79604 Pain in right leg: Secondary | ICD-10-CM | POA: Diagnosis not present

## 2017-02-14 DIAGNOSIS — M79605 Pain in left leg: Secondary | ICD-10-CM | POA: Diagnosis not present

## 2017-02-14 DIAGNOSIS — M545 Low back pain: Secondary | ICD-10-CM | POA: Diagnosis not present

## 2017-02-19 DIAGNOSIS — M79604 Pain in right leg: Secondary | ICD-10-CM | POA: Diagnosis not present

## 2017-02-19 DIAGNOSIS — M79605 Pain in left leg: Secondary | ICD-10-CM | POA: Diagnosis not present

## 2017-02-19 DIAGNOSIS — M545 Low back pain: Secondary | ICD-10-CM | POA: Diagnosis not present

## 2017-02-21 DIAGNOSIS — M79604 Pain in right leg: Secondary | ICD-10-CM | POA: Diagnosis not present

## 2017-02-21 DIAGNOSIS — M79605 Pain in left leg: Secondary | ICD-10-CM | POA: Diagnosis not present

## 2017-02-21 DIAGNOSIS — M545 Low back pain: Secondary | ICD-10-CM | POA: Diagnosis not present

## 2017-02-28 ENCOUNTER — Ambulatory Visit (INDEPENDENT_AMBULATORY_CARE_PROVIDER_SITE_OTHER): Payer: Medicare Other | Admitting: *Deleted

## 2017-02-28 DIAGNOSIS — I639 Cerebral infarction, unspecified: Secondary | ICD-10-CM | POA: Diagnosis not present

## 2017-02-28 DIAGNOSIS — M545 Low back pain: Secondary | ICD-10-CM | POA: Diagnosis not present

## 2017-02-28 DIAGNOSIS — M79604 Pain in right leg: Secondary | ICD-10-CM | POA: Diagnosis not present

## 2017-02-28 DIAGNOSIS — M79605 Pain in left leg: Secondary | ICD-10-CM | POA: Diagnosis not present

## 2017-02-28 NOTE — Progress Notes (Signed)
Carelink Summary Report / Loop Recorder 

## 2017-03-04 ENCOUNTER — Encounter: Payer: Self-pay | Admitting: Family Medicine

## 2017-03-04 ENCOUNTER — Telehealth: Payer: Self-pay | Admitting: Neurology

## 2017-03-04 NOTE — Telephone Encounter (Signed)
RESPECT ESUS END OF STUDY VISIT  Alicia Bolton was seen today for her end of study visit. She continues to do well without recurrent stroke or TIA symptoms. She is bed full physical recovery from the stroke but does state that at times his stamina is an short-term memory and cognition are not good. She has stopped the study medications. She has no new complaints today.  Physical exam and neurological exam were performed as per study protocol. She had diminished recall 0/3 but otherwise unremarkable exam. She was advised to start taking aspirin 325 again daily for stroke prevention and maintain aggressive risk factor modification with strict control of hypertension, diabetes and lipids. She was encouraged to eat a healthy diet and maintain ideal body weight and be active. She was advised to follow-up in the stroke clinic in 6 months or call earlier if necessary. She voiced understanding.  Antony Contras, MD  [facility] [address] [place], [place] [address]  Phone [phone] Fax [phone]

## 2017-03-04 NOTE — Telephone Encounter (Signed)
If patient calls back please schedule her a 6 month follow up with Dr. Erlinda Hong thanks.   LEft vm for patient to call back during business hours to schedule a 6 month follow up with Dr. Erlinda Hong in the clinic. Pt needs a clinic appt.

## 2017-03-05 ENCOUNTER — Encounter: Payer: Self-pay | Admitting: Family Medicine

## 2017-03-05 DIAGNOSIS — M79604 Pain in right leg: Secondary | ICD-10-CM | POA: Diagnosis not present

## 2017-03-05 DIAGNOSIS — M545 Low back pain: Secondary | ICD-10-CM | POA: Diagnosis not present

## 2017-03-05 DIAGNOSIS — M79605 Pain in left leg: Secondary | ICD-10-CM | POA: Diagnosis not present

## 2017-03-13 ENCOUNTER — Ambulatory Visit: Payer: Medicare Other | Admitting: Family Medicine

## 2017-03-14 LAB — CUP PACEART REMOTE DEVICE CHECK
Date Time Interrogation Session: 20180503073637
MDC IDC PG IMPLANT DT: 20161208

## 2017-03-19 ENCOUNTER — Encounter: Payer: Self-pay | Admitting: Family Medicine

## 2017-03-19 DIAGNOSIS — M79604 Pain in right leg: Secondary | ICD-10-CM | POA: Diagnosis not present

## 2017-03-19 DIAGNOSIS — M545 Low back pain: Secondary | ICD-10-CM | POA: Diagnosis not present

## 2017-03-19 DIAGNOSIS — M79605 Pain in left leg: Secondary | ICD-10-CM | POA: Diagnosis not present

## 2017-03-20 ENCOUNTER — Ambulatory Visit: Payer: Medicare Other | Admitting: Family Medicine

## 2017-03-21 ENCOUNTER — Other Ambulatory Visit: Payer: Self-pay | Admitting: Family Medicine

## 2017-03-21 DIAGNOSIS — Z1231 Encounter for screening mammogram for malignant neoplasm of breast: Secondary | ICD-10-CM

## 2017-03-26 DIAGNOSIS — M79605 Pain in left leg: Secondary | ICD-10-CM | POA: Diagnosis not present

## 2017-03-26 DIAGNOSIS — M79604 Pain in right leg: Secondary | ICD-10-CM | POA: Diagnosis not present

## 2017-03-26 DIAGNOSIS — M545 Low back pain: Secondary | ICD-10-CM | POA: Diagnosis not present

## 2017-03-29 ENCOUNTER — Other Ambulatory Visit: Payer: Self-pay | Admitting: Family Medicine

## 2017-04-01 ENCOUNTER — Ambulatory Visit (INDEPENDENT_AMBULATORY_CARE_PROVIDER_SITE_OTHER): Payer: Medicare Other | Admitting: *Deleted

## 2017-04-01 DIAGNOSIS — I639 Cerebral infarction, unspecified: Secondary | ICD-10-CM | POA: Diagnosis not present

## 2017-04-01 NOTE — Progress Notes (Signed)
Carelink Summary Report / Loop Recorder 

## 2017-04-02 ENCOUNTER — Telehealth: Payer: Self-pay | Admitting: Family Medicine

## 2017-04-02 DIAGNOSIS — M79604 Pain in right leg: Secondary | ICD-10-CM | POA: Diagnosis not present

## 2017-04-02 DIAGNOSIS — M79605 Pain in left leg: Secondary | ICD-10-CM | POA: Diagnosis not present

## 2017-04-02 DIAGNOSIS — M545 Low back pain: Secondary | ICD-10-CM | POA: Diagnosis not present

## 2017-04-02 MED ORDER — LORAZEPAM 0.5 MG PO TABS: 0.5000 mg | ORAL_TABLET | Freq: Two times a day (BID) | ORAL | 1 refills | Status: DC | PRN

## 2017-04-02 NOTE — Telephone Encounter (Signed)
Medication called/sent to requested pharmacy  

## 2017-04-02 NOTE — Telephone Encounter (Signed)
Requesting a refill on Ativan .5mg  - Ok to refill??      LOV - 12/02/16  lRF - 10/26/16 #30/1

## 2017-04-02 NOTE — Telephone Encounter (Signed)
okay

## 2017-04-03 LAB — CUP PACEART REMOTE DEVICE CHECK
Implantable Pulse Generator Implant Date: 20161208
MDC IDC SESS DTM: 20180602073655

## 2017-04-09 ENCOUNTER — Ambulatory Visit: Payer: Medicaid Other | Admitting: Family Medicine

## 2017-04-09 DIAGNOSIS — M79605 Pain in left leg: Secondary | ICD-10-CM | POA: Diagnosis not present

## 2017-04-09 DIAGNOSIS — M545 Low back pain: Secondary | ICD-10-CM | POA: Diagnosis not present

## 2017-04-09 DIAGNOSIS — M79604 Pain in right leg: Secondary | ICD-10-CM | POA: Diagnosis not present

## 2017-04-11 ENCOUNTER — Ambulatory Visit: Payer: Medicare Other

## 2017-04-12 ENCOUNTER — Ambulatory Visit: Payer: Medicaid Other | Admitting: Family Medicine

## 2017-04-17 ENCOUNTER — Ambulatory Visit (INDEPENDENT_AMBULATORY_CARE_PROVIDER_SITE_OTHER): Payer: Medicare Other | Admitting: Family Medicine

## 2017-04-17 ENCOUNTER — Encounter: Payer: Self-pay | Admitting: Family Medicine

## 2017-04-17 VITALS — BP 110/74 | HR 68 | Temp 97.9°F | Resp 14 | Ht 66.0 in | Wt 129.0 lb

## 2017-04-17 DIAGNOSIS — E782 Mixed hyperlipidemia: Secondary | ICD-10-CM

## 2017-04-17 DIAGNOSIS — M51369 Other intervertebral disc degeneration, lumbar region without mention of lumbar back pain or lower extremity pain: Secondary | ICD-10-CM

## 2017-04-17 DIAGNOSIS — M4802 Spinal stenosis, cervical region: Secondary | ICD-10-CM | POA: Diagnosis not present

## 2017-04-17 DIAGNOSIS — I639 Cerebral infarction, unspecified: Secondary | ICD-10-CM

## 2017-04-17 DIAGNOSIS — A6004 Herpesviral vulvovaginitis: Secondary | ICD-10-CM

## 2017-04-17 DIAGNOSIS — I10 Essential (primary) hypertension: Secondary | ICD-10-CM | POA: Diagnosis not present

## 2017-04-17 DIAGNOSIS — M5136 Other intervertebral disc degeneration, lumbar region: Secondary | ICD-10-CM

## 2017-04-17 HISTORY — DX: Other intervertebral disc degeneration, lumbar region without mention of lumbar back pain or lower extremity pain: M51.369

## 2017-04-17 HISTORY — DX: Other intervertebral disc degeneration, lumbar region: M51.36

## 2017-04-17 MED ORDER — HYDROCODONE-ACETAMINOPHEN 5-325 MG PO TABS: 1.0000 | ORAL_TABLET | ORAL | 0 refills | Status: DC | PRN

## 2017-04-17 MED ORDER — CLONIDINE HCL 0.1 MG PO TABS: 0.1000 mg | ORAL_TABLET | Freq: Two times a day (BID) | ORAL | 1 refills | Status: DC

## 2017-04-17 NOTE — Assessment & Plan Note (Signed)
Well controlled, continue HCTZ, clonidine 0.1mg  BID

## 2017-04-17 NOTE — Assessment & Plan Note (Signed)
On suppressive therapy Valtrex
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
## 2017-04-17 NOTE — Assessment & Plan Note (Signed)
Continue physical therapy Refilled norco

## 2017-04-17 NOTE — Assessment & Plan Note (Signed)
Lipids at goal, plan to recheck at physical in Oct Continue statin

## 2017-04-17 NOTE — Patient Instructions (Signed)
F/U 4 months for Physical  

## 2017-04-29 ENCOUNTER — Ambulatory Visit (INDEPENDENT_AMBULATORY_CARE_PROVIDER_SITE_OTHER): Payer: Medicare HMO | Admitting: *Deleted

## 2017-04-29 DIAGNOSIS — I639 Cerebral infarction, unspecified: Secondary | ICD-10-CM | POA: Diagnosis not present

## 2017-04-29 NOTE — Progress Notes (Signed)
Carelink Summary Report / Loop Recorder 

## 2017-04-30 ENCOUNTER — Telehealth: Payer: Self-pay | Admitting: *Deleted

## 2017-04-30 ENCOUNTER — Other Ambulatory Visit: Payer: Self-pay | Admitting: Family Medicine

## 2017-04-30 DIAGNOSIS — I10 Essential (primary) hypertension: Secondary | ICD-10-CM

## 2017-04-30 MED ORDER — CLONIDINE HCL 0.1 MG PO TABS: 0.1000 mg | ORAL_TABLET | Freq: Two times a day (BID) | ORAL | 1 refills | Status: DC

## 2017-04-30 NOTE — Telephone Encounter (Signed)
Received call from patient.   Requested refill on BP meds.   Prescription sent to pharmacy.

## 2017-05-02 ENCOUNTER — Encounter: Payer: Self-pay | Admitting: Family Medicine

## 2017-05-09 LAB — CUP PACEART REMOTE DEVICE CHECK
Date Time Interrogation Session: 20180702124746
Implantable Pulse Generator Implant Date: 20161208

## 2017-05-27 ENCOUNTER — Encounter: Payer: Self-pay | Admitting: Family Medicine

## 2017-05-27 MED ORDER — SULFAMETHOXAZOLE-TRIMETHOPRIM 800-160 MG PO TABS: 1.0000 | ORAL_TABLET | Freq: Two times a day (BID) | ORAL | 0 refills | Status: DC

## 2017-05-29 ENCOUNTER — Ambulatory Visit (INDEPENDENT_AMBULATORY_CARE_PROVIDER_SITE_OTHER): Payer: Medicare HMO | Admitting: *Deleted

## 2017-05-29 DIAGNOSIS — I639 Cerebral infarction, unspecified: Secondary | ICD-10-CM

## 2017-05-30 NOTE — Progress Notes (Signed)
Carelink Summary Report / Loop Recorder 

## 2017-06-10 LAB — CUP PACEART REMOTE DEVICE CHECK
Date Time Interrogation Session: 20180801154142
MDC IDC PG IMPLANT DT: 20161208

## 2017-06-17 ENCOUNTER — Encounter: Payer: Self-pay | Admitting: Family Medicine

## 2017-06-17 ENCOUNTER — Ambulatory Visit (INDEPENDENT_AMBULATORY_CARE_PROVIDER_SITE_OTHER): Payer: Medicare HMO | Admitting: Family Medicine

## 2017-06-17 VITALS — BP 134/72 | HR 80 | Temp 98.3°F | Resp 16 | Ht 66.0 in | Wt 134.0 lb

## 2017-06-17 DIAGNOSIS — Z981 Arthrodesis status: Secondary | ICD-10-CM

## 2017-06-17 DIAGNOSIS — M4802 Spinal stenosis, cervical region: Secondary | ICD-10-CM

## 2017-06-17 DIAGNOSIS — Z9889 Other specified postprocedural states: Secondary | ICD-10-CM | POA: Diagnosis not present

## 2017-06-17 DIAGNOSIS — I639 Cerebral infarction, unspecified: Secondary | ICD-10-CM | POA: Diagnosis not present

## 2017-06-17 DIAGNOSIS — M62838 Other muscle spasm: Secondary | ICD-10-CM

## 2017-06-17 MED ORDER — HYDROCODONE-ACETAMINOPHEN 10-325 MG PO TABS: 1.0000 | ORAL_TABLET | Freq: Three times a day (TID) | ORAL | 0 refills | Status: DC | PRN

## 2017-06-17 MED ORDER — METHYLPREDNISOLONE 4 MG PO TBPK: ORAL_TABLET | ORAL | 0 refills | Status: DC

## 2017-06-17 MED ORDER — METHOCARBAMOL 750 MG PO TABS: 750.0000 mg | ORAL_TABLET | Freq: Three times a day (TID) | ORAL | 1 refills | Status: DC | PRN

## 2017-06-17 NOTE — Patient Instructions (Addendum)
Referral to [facility]  F/U as previous

## 2017-06-17 NOTE — Assessment & Plan Note (Signed)
She has known degenerative disc disease as well as spinal stenosis which she has had decompressive surgery and fusion performed. She does get pain on and off but quite severe other most of this looks muscular today. She is not having any new paresthesias into her upper extremities. I try to treat muscular component of this with Robaxin as well as Medrol dose pack and she cannot take anti-inflammatories. She was also given a prescription for hydrocodone 10/325 mg #30. She does need to get reestablished with the neurosurgeon as if this progresses she's had multiple surgical interventions we will need specialist involved. Firstly she cannot return to her previous surgeon does not want to because of a pending lawsuit with regard to something that was missed on one of her scans resulting in removal of a benign substernal chest mass.  Will refer her to [facility]

## 2017-06-17 NOTE — Progress Notes (Signed)
Subjective:    Patient ID: Alicia Bolton, female    DOB: [birth year], 61 y.o.   MRN: [id number]  Patient presents for L Sided Neck Pain (x1 day- noted stiffness to neck yesterday that has worsened- decreased ROM- reports pain starts at back of ear and radiates down toward jaw and down shoulder)  Pt has known cervical spine disoders ,Unable to go to her previous surgery due to pending lawsuit. His history of cervical spinal stenosis she's had a few different neck surgeries her last decompression the cervical spine was done in 2015. Yesterday she began having severe neck pain mostly on the left side was going to the top of her shoulder and into her upper arm. She denies any tingling or numbness in the hand or finger. Denies any particular injury to her neck. She does get some stiffness on and off and has not been this severe in quite some time. She does not have any muscle relaxers at home and is not on any anti-inflammatories that she is still in the stroke study. She does have a couple pills of hydrocodone left over from her prescription given a couple months ago. She denies any chest pain shortness of breath  Would like to establish with a new neurosurgeon.      Review Of Systems:  GEN- denies fatigue, fever, weight loss,weakness, recent illness HEENT- denies eye drainage, change in vision, nasal discharge, CVS- denies chest pain, palpitations RESP- denies SOB, cough, wheeze ABD- denies N/V, change in stools, abd pain GU- denies dysuria, hematuria, dribbling, incontinence MSK- + joint pain, muscle aches, injury Neuro- denies headache, dizziness, syncope, seizure activity       Objective:    BP 134/72   Pulse 80   Temp 98.3 F (36.8 C) (Oral)   Resp 16   Ht 5\' 6"  (1.676 m)   Wt 134 lb (60.8 kg)   SpO2 98%   BMI 21.63 kg/m  GEN- NAD, alert and oriented x3 HEENT- PERRL, EOMI, non injected sclera, pink conjunctiva, MMM, oropharynx clear Neck- Supple, decreased ROM , + spasm  paraspinals and trapezius left side  CVS- RRR, no murmur RESP-CTAB MSK- Fair ROM UPPER Ext bilat, decreased due to neck discomfort with lifting left arm above shoulder height, rotator cuff intact, biceps in tact Neuro-CNII-XII in tact, normal tone UE, sensation in tact UE, motor equal bilat UE  Pulses- Radial 2+        Assessment & Plan:      Problem List Items Addressed This Visit      Unprioritized   Spinal stenosis of cervical region - Primary    She has known degenerative disc disease as well as spinal stenosis which she has had decompressive surgery and fusion performed. She does get pain on and off but quite severe other most of this looks muscular today. She is not having any new paresthesias into her upper extremities. I try to treat muscular component of this with Robaxin as well as Medrol dose pack and she cannot take anti-inflammatories. She was also given a prescription for hydrocodone 10/325 mg #30. She does need to get reestablished with the neurosurgeon as if this progresses she's had multiple surgical interventions we will need specialist involved. Firstly she cannot return to her previous surgeon does not want to because of a pending lawsuit with regard to something that was missed on one of her scans resulting in removal of a benign substernal chest mass.  Hold on repeat imaging at this time  Will refer her to [facility]       Other Visit Diagnoses    Cervical paraspinal muscle spasm       History of fusion of cervical spine       History of excision of lamina of cervical vertebra for decompression of spinal cord          Note: This dictation was prepared with Dragon dictation along with smaller phrase technology. Any transcriptional errors that result from this process are unintentional.

## 2017-06-25 ENCOUNTER — Encounter: Payer: Self-pay | Admitting: Family Medicine

## 2017-06-28 ENCOUNTER — Ambulatory Visit (INDEPENDENT_AMBULATORY_CARE_PROVIDER_SITE_OTHER): Payer: Medicare HMO | Admitting: *Deleted

## 2017-06-28 DIAGNOSIS — I639 Cerebral infarction, unspecified: Secondary | ICD-10-CM

## 2017-06-28 LAB — CUP PACEART REMOTE DEVICE CHECK
Implantable Pulse Generator Implant Date: 20161208
MDC IDC SESS DTM: 20180831161011

## 2017-06-28 NOTE — Progress Notes (Signed)
Carelink Summary Report / Loop Recorder 

## 2017-06-28 NOTE — Progress Notes (Signed)
Carelink summary report received. Battery status OK. Normal device function. No new symptom episodes, tachy episodes, brady, or pause episodes. No new AF episodes. Monthly summary reports and ROV/PRN 

## 2017-07-03 ENCOUNTER — Encounter: Payer: Self-pay | Admitting: Family Medicine

## 2017-07-29 ENCOUNTER — Ambulatory Visit (INDEPENDENT_AMBULATORY_CARE_PROVIDER_SITE_OTHER): Payer: Medicare HMO | Admitting: *Deleted

## 2017-07-29 DIAGNOSIS — I639 Cerebral infarction, unspecified: Secondary | ICD-10-CM | POA: Diagnosis not present

## 2017-07-29 NOTE — Progress Notes (Signed)
Carelink Summary Report / Loop Recorder 

## 2017-07-31 LAB — CUP PACEART REMOTE DEVICE CHECK
Implantable Pulse Generator Implant Date: 20161208
MDC IDC SESS DTM: 20180930194046

## 2017-08-02 ENCOUNTER — Other Ambulatory Visit: Payer: Self-pay | Admitting: Family Medicine

## 2017-08-03 ENCOUNTER — Encounter: Payer: Self-pay | Admitting: Family Medicine

## 2017-08-06 ENCOUNTER — Encounter: Payer: Self-pay | Admitting: Family Medicine

## 2017-08-13 ENCOUNTER — Encounter: Payer: Self-pay | Admitting: Family Medicine

## 2017-08-13 MED ORDER — SULFAMETHOXAZOLE-TRIMETHOPRIM 800-160 MG PO TABS: 1.0000 | ORAL_TABLET | Freq: Two times a day (BID) | ORAL | 0 refills | Status: DC

## 2017-08-14 ENCOUNTER — Ambulatory Visit: Payer: Self-pay | Admitting: Family Medicine

## 2017-08-20 ENCOUNTER — Encounter: Payer: Self-pay | Admitting: Family Medicine

## 2017-08-20 ENCOUNTER — Ambulatory Visit (INDEPENDENT_AMBULATORY_CARE_PROVIDER_SITE_OTHER): Payer: Medicare HMO | Admitting: Family Medicine

## 2017-08-20 VITALS — BP 128/70 | HR 82 | Temp 97.9°F | Resp 16 | Ht 66.0 in | Wt 131.0 lb

## 2017-08-20 DIAGNOSIS — Z1211 Encounter for screening for malignant neoplasm of colon: Secondary | ICD-10-CM | POA: Diagnosis not present

## 2017-08-20 DIAGNOSIS — R7302 Impaired glucose tolerance (oral): Secondary | ICD-10-CM

## 2017-08-20 DIAGNOSIS — F17200 Nicotine dependence, unspecified, uncomplicated: Secondary | ICD-10-CM | POA: Diagnosis not present

## 2017-08-20 DIAGNOSIS — I10 Essential (primary) hypertension: Secondary | ICD-10-CM

## 2017-08-20 DIAGNOSIS — Z1239 Encounter for other screening for malignant neoplasm of breast: Secondary | ICD-10-CM

## 2017-08-20 DIAGNOSIS — Z0000 Encounter for general adult medical examination without abnormal findings: Secondary | ICD-10-CM

## 2017-08-20 DIAGNOSIS — E782 Mixed hyperlipidemia: Secondary | ICD-10-CM | POA: Diagnosis not present

## 2017-08-20 DIAGNOSIS — Z1231 Encounter for screening mammogram for malignant neoplasm of breast: Secondary | ICD-10-CM | POA: Diagnosis not present

## 2017-08-20 DIAGNOSIS — Z86010 Personal history of colonic polyps: Secondary | ICD-10-CM

## 2017-08-20 DIAGNOSIS — S161XXA Strain of muscle, fascia and tendon at neck level, initial encounter: Secondary | ICD-10-CM

## 2017-08-20 DIAGNOSIS — S39012A Strain of muscle, fascia and tendon of lower back, initial encounter: Secondary | ICD-10-CM

## 2017-08-20 MED ORDER — HYDROCODONE-ACETAMINOPHEN 10-325 MG PO TABS: 1.0000 | ORAL_TABLET | Freq: Three times a day (TID) | ORAL | 0 refills | Status: DC | PRN

## 2017-08-20 MED ORDER — CLOTRIMAZOLE 1 % EX CREA
1.0000 | TOPICAL_CREAM | Freq: Two times a day (BID) | CUTANEOUS | 1 refills | Status: DC
Start: 2017-08-20 — End: 2020-03-30

## 2017-08-20 MED ORDER — METHOCARBAMOL 750 MG PO TABS: 750.0000 mg | ORAL_TABLET | Freq: Three times a day (TID) | ORAL | 1 refills | Status: DC | PRN
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
## 2017-08-20 NOTE — Patient Instructions (Addendum)
Referral to GI for colonoscopy  Schedule your mammogram  Take pain medicine and muscle relaxer Continue all other medications F/U 4 months

## 2017-08-21 LAB — COMPREHENSIVE METABOLIC PANEL
AG RATIO: 1.6 (calc) (ref 1.0–2.5)
ALKALINE PHOSPHATASE (APISO): 76 U/L (ref 33–130)
ALT: 8 U/L (ref 6–29)
AST: 14 U/L (ref 10–35)
Albumin: 4.2 g/dL (ref 3.6–5.1)
BILIRUBIN TOTAL: 0.6 mg/dL (ref 0.2–1.2)
BUN: 13 mg/dL (ref 7–25)
CALCIUM: 9.2 mg/dL (ref 8.6–10.4)
CHLORIDE: 102 mmol/L (ref 98–110)
CO2: 28 mmol/L (ref 20–32)
Creat: 0.78 mg/dL (ref 0.50–0.99)
GLOBULIN: 2.7 g/dL (ref 1.9–3.7)
GLUCOSE: 93 mg/dL (ref 65–99)
Potassium: 3.5 mmol/L (ref 3.5–5.3)
Sodium: 139 mmol/L (ref 135–146)
Total Protein: 6.9 g/dL (ref 6.1–8.1)

## 2017-08-21 LAB — LIPID PANEL
Cholesterol: 144 mg/dL (ref <200)
HDL: 56 mg/dL (ref >50)
LDL Cholesterol (Calc): 68 mg/dL (calc)
NON-HDL CHOLESTEROL (CALC): 88 mg/dL (ref <130)
TRIGLYCERIDES: 115 mg/dL (ref <150)
Total CHOL/HDL Ratio: 2.6 (calc) (ref <5.0)

## 2017-08-21 LAB — CBC WITH DIFFERENTIAL/PLATELET
BASOS ABS: 49 {cells}/uL (ref 0–200)
BASOS PCT: 0.5 %
Eosinophils Absolute: 146 cells/uL (ref 15–500)
Eosinophils Relative: 1.5 %
HEMATOCRIT: 41 % (ref 35.0–45.0)
HEMOGLOBIN: 13.8 g/dL (ref 11.7–15.5)
Lymphs Abs: 4268 cells/uL — ABNORMAL HIGH (ref 850–3900)
MCH: 31.2 pg (ref 27.0–33.0)
MCHC: 33.7 g/dL (ref 32.0–36.0)
MCV: 92.6 fL (ref 80.0–100.0)
MONOS PCT: 5.7 %
MPV: 9.3 fL (ref 7.5–12.5)
NEUTROS ABS: 4685 {cells}/uL (ref 1500–7800)
NEUTROS PCT: 48.3 %
Platelets: 339 10*3/uL (ref 140–400)
RBC: 4.43 10*6/uL (ref 3.80–5.10)
RDW: 13 % (ref 11.0–15.0)
TOTAL LYMPHOCYTE: 44 %
WBC mixed population: 553 cells/uL (ref 200–950)
WBC: 9.7 10*3/uL (ref 3.8–10.8)

## 2017-08-21 LAB — HEMOGLOBIN A1C
EAG (MMOL/L): 6 (calc)
Hgb A1c MFr Bld: 5.4 % of total Hgb (ref <5.7)
Mean Plasma Glucose: 108 (calc)

## 2017-08-22 ENCOUNTER — Encounter: Payer: Self-pay | Admitting: *Deleted

## 2017-08-22 ENCOUNTER — Encounter: Payer: Self-pay | Admitting: Family Medicine

## 2017-08-26 ENCOUNTER — Encounter: Payer: Self-pay | Admitting: Family Medicine

## 2017-08-27 ENCOUNTER — Ambulatory Visit (INDEPENDENT_AMBULATORY_CARE_PROVIDER_SITE_OTHER): Payer: Medicare HMO

## 2017-08-27 ENCOUNTER — Encounter (HOSPITAL_COMMUNITY): Payer: Self-pay | Admitting: Family Medicine

## 2017-08-27 ENCOUNTER — Ambulatory Visit (INDEPENDENT_AMBULATORY_CARE_PROVIDER_SITE_OTHER): Payer: Medicare HMO | Admitting: *Deleted

## 2017-08-27 ENCOUNTER — Ambulatory Visit (HOSPITAL_COMMUNITY)
Admission: EM | Admit: 2017-08-27 | Discharge: 2017-08-27 | Disposition: A | Discharge status: Home / Self Care | Payer: Medicare HMO | Attending: Family Medicine | Admitting: Family Medicine

## 2017-08-27 DIAGNOSIS — S161XXA Strain of muscle, fascia and tendon at neck level, initial encounter: Secondary | ICD-10-CM

## 2017-08-27 DIAGNOSIS — I639 Cerebral infarction, unspecified: Secondary | ICD-10-CM

## 2017-08-27 DIAGNOSIS — S46911A Strain of unspecified muscle, fascia and tendon at shoulder and upper arm level, right arm, initial encounter: Secondary | ICD-10-CM | POA: Diagnosis not present

## 2017-08-27 DIAGNOSIS — M542 Cervicalgia: Secondary | ICD-10-CM | POA: Diagnosis not present

## 2017-08-27 DIAGNOSIS — M25511 Pain in right shoulder: Secondary | ICD-10-CM

## 2017-08-27 DIAGNOSIS — S4991XA Unspecified injury of right shoulder and upper arm, initial encounter: Secondary | ICD-10-CM | POA: Diagnosis not present

## 2017-08-27 DIAGNOSIS — S199XXA Unspecified injury of neck, initial encounter: Secondary | ICD-10-CM | POA: Diagnosis not present

## 2017-08-27 MED ORDER — MELOXICAM 7.5 MG PO TABS: 7.5000 mg | ORAL_TABLET | Freq: Every day | ORAL | 0 refills | Status: DC
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
## 2017-08-29 NOTE — Progress Notes (Signed)
Carelink Summary Report / Loop Recorder 

## 2017-08-30 ENCOUNTER — Encounter: Payer: Self-pay | Admitting: Family Medicine

## 2017-08-30 ENCOUNTER — Ambulatory Visit (INDEPENDENT_AMBULATORY_CARE_PROVIDER_SITE_OTHER): Payer: Medicare HMO | Admitting: Family Medicine

## 2017-08-30 VITALS — BP 122/78 | HR 82 | Temp 98.7°F | Resp 16 | Ht 66.0 in | Wt 131.0 lb

## 2017-08-30 DIAGNOSIS — S161XXD Strain of muscle, fascia and tendon at neck level, subsequent encounter: Secondary | ICD-10-CM

## 2017-08-30 DIAGNOSIS — M4802 Spinal stenosis, cervical region: Secondary | ICD-10-CM

## 2017-08-30 LAB — CUP PACEART REMOTE DEVICE CHECK
MDC IDC PG IMPLANT DT: 20161208
MDC IDC SESS DTM: 20181030194004

## 2017-08-30 MED ORDER — HYDROCODONE-ACETAMINOPHEN 10-325 MG PO TABS: 1.0000 | ORAL_TABLET | Freq: Three times a day (TID) | ORAL | 0 refills | Status: DC | PRN

## 2017-08-30 MED ORDER — TIZANIDINE HCL 4 MG PO TABS: 4.0000 mg | ORAL_TABLET | Freq: Four times a day (QID) | ORAL | 1 refills | Status: DC | PRN

## 2017-08-30 MED ORDER — MELOXICAM 7.5 MG PO TABS: 7.5000 mg | ORAL_TABLET | Freq: Every day | ORAL | 0 refills | Status: DC
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
## 2017-08-30 NOTE — Patient Instructions (Addendum)
Referral to therapy Pain medication refilled  Get the zanaflex Take the meloxicam once a day with food F/U 5 weeks
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
## 2017-09-02 ENCOUNTER — Other Ambulatory Visit: Payer: Self-pay | Admitting: *Deleted

## 2017-09-02 MED ORDER — TIZANIDINE HCL 4 MG PO TABS: 4.0000 mg | ORAL_TABLET | Freq: Four times a day (QID) | ORAL | 1 refills | Status: DC | PRN

## 2017-09-02 MED ORDER — MELOXICAM 7.5 MG PO TABS: 7.5000 mg | ORAL_TABLET | Freq: Every day | ORAL | 0 refills | Status: DC

## 2017-09-03 ENCOUNTER — Other Ambulatory Visit: Payer: Self-pay | Admitting: *Deleted

## 2017-09-03 ENCOUNTER — Encounter: Payer: Self-pay | Admitting: Family Medicine

## 2017-09-03 MED ORDER — MELOXICAM 7.5 MG PO TABS: 7.5000 mg | ORAL_TABLET | Freq: Every day | ORAL | 0 refills | Status: AC

## 2017-09-03 MED ORDER — MELOXICAM 7.5 MG PO TABS: 7.5000 mg | ORAL_TABLET | Freq: Every day | ORAL | 0 refills | Status: DC

## 2017-09-09 ENCOUNTER — Encounter: Payer: Self-pay | Admitting: Physical Therapy

## 2017-09-09 ENCOUNTER — Ambulatory Visit: Payer: Medicare HMO | Attending: Family Medicine | Admitting: Physical Therapy

## 2017-09-09 ENCOUNTER — Other Ambulatory Visit: Payer: Self-pay

## 2017-09-09 DIAGNOSIS — M542 Cervicalgia: Secondary | ICD-10-CM | POA: Diagnosis not present

## 2017-09-09 DIAGNOSIS — R252 Cramp and spasm: Secondary | ICD-10-CM | POA: Insufficient documentation

## 2017-09-09 DIAGNOSIS — M25511 Pain in right shoulder: Secondary | ICD-10-CM | POA: Insufficient documentation
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
## 2017-09-10 ENCOUNTER — Ambulatory Visit: Payer: Medicare Other | Admitting: Neurology

## 2017-09-10 ENCOUNTER — Telehealth: Payer: Self-pay

## 2017-09-10 NOTE — Telephone Encounter (Signed)
Patient no show for appt today. 

## 2017-09-11 ENCOUNTER — Encounter: Payer: Self-pay | Admitting: Neurology

## 2017-09-13 ENCOUNTER — Encounter: Payer: Self-pay | Admitting: Physical Therapy

## 2017-09-13 ENCOUNTER — Ambulatory Visit: Payer: Medicare HMO | Admitting: Physical Therapy

## 2017-09-13 DIAGNOSIS — M25511 Pain in right shoulder: Secondary | ICD-10-CM | POA: Diagnosis not present

## 2017-09-13 DIAGNOSIS — R252 Cramp and spasm: Secondary | ICD-10-CM

## 2017-09-13 DIAGNOSIS — M542 Cervicalgia: Secondary | ICD-10-CM

## 2017-09-13 NOTE — Therapy (Signed)
[place], [place], [address] Phone: [phone]   Fax:  [phone]  Physical Therapy Treatment  Patient Details  Name: Alicia Bolton MRN: [id number] Date of Birth: 12-18-1955 Referring Provider: Vic Blackbird, MD   Encounter Date: 09/13/2017  PT End of Session - 09/13/17 1123    Visit Number  2    Number of Visits  12    Date for PT Re-Evaluation  10/21/17    PT Start Time  0800    PT Stop Time  0845    PT Time Calculation (min)  45 min    Activity Tolerance  Patient tolerated treatment well    Behavior During Therapy  [facility] for tasks assessed/performed       Past Medical History:  Diagnosis Date  . Anxiety    takes Ativan daily as needed  . Arthritis   . Fibromyalgia   . History of bronchitis    few yrs ago  . History of colon polyps    benign  . History of migraine    last one over a month ago  . Hyperlipidemia    takes Atorvastatin daily  . Hypertension    takes Clonidine and HCTZ daily  . Joint pain   . Migraines   . Neuromuscular disorder ([place])   . Nocturia   . Stroke [facility]) 78/6767   Embolic Right ACA,  ILR placed Dr. Rayann Heman.States slight weakness in left    Past Surgical History:  Procedure Laterality Date  . ABDOMINAL HYSTERECTOMY  1997  . CARPAL TUNNEL RELEASE Right   . CARPAL TUNNEL RELEASE Left 10/25/2014   Performed by Elaina Hoops, MD at [facility] NEURO ORS  . CERVICAL FUSION  2007  . CERVICAL THREE-FOUR, CERVICAL FOUR FIVE ANTERIOR CERVICAL DECOMPRESSION/DISCECTOMY FUSION 2 LEVEL/HARDWARE REMOVALCERVICAL FIVE-SEVEN N/A 10/25/2014   Performed by Elaina Hoops, MD at [facility] NEURO ORS  . CESAREAN SECTION  1985  . CHEST SURGERY  04/2015   Benign chest mass removed- [place]  . COLONOSCOPY    . EXCISION DUCTAL SYSTEM LEFT BREAST Left 11/14/2016   Performed by Jovita Kussmaul, MD at [place]  . KNEE ARTHROSCOPY Right 1999  . Loop Recorder Insertion N/A  10/06/2015   Performed by Thompson Grayer, MD at [place] [facility]  . RIGHT KNEE ARTHROSCOPY WITH MEDIAL MENISECTOMY Right 10/09/2013   Performed by Johnny Bridge, MD at [facility]  . thumb surgery Right   . TRANSESOPHAGEAL ECHOCARDIOGRAM (TEE) N/A 10/05/2015   Performed by Acie Fredrickson Wonda Cheng, MD at [facility] ENDOSCOPY    There were no vitals filed for this visit.  Subjective Assessment - 09/13/17 0801    Subjective  Pt. reports "the weather has been affecting me and I've been up and down." Pt. states she took medication and pain is currently 2-3/10. Pt. also reports frequent headaches keeping her awake at night but that "today is a good day." She states that previously prescribed exercises help somewhat but do not relieve headache symptoms.     Pertinent History  Pt. has hx of previous neck surgery, disc fusion     Limitations  Lifting    How long can you sit comfortably?  unlimited    How long can you stand comfortably?  unlimited    How long can you walk comfortably?  unlimited     Diagnostic tests  -- no broken bones per pt. report  Currently in Pain?  Yes    Pain Score  3     Pain Location  Neck    Pain Onset  1 to 4 weeks ago       [facility] Adult PT Treatment/Exercise - 09/13/17 1116      Neck Exercises: Standing   Other Standing Exercises  standing row with red theraband; 10 reps       Modalities   Modalities  Moist Heat      Moist Heat Therapy   Number Minutes Moist Heat  5 Minutes    Moist Heat Location  Shoulder;Cervical Rt. side, pt. seated       Manual Therapy   Manual Therapy  Myofascial release;Soft tissue mobilization;Joint mobilization    Manual therapy comments  trigger point release over rt. upper trapezius x3; rt. pectoralis major x3    Joint Mobilization  1st rib mobilization; grade 2-3; 45-60 bouts; 8 bouts     Soft tissue mobilization  Suboccipital release pt. in supine; held 3-4 minutes; 2 reps         PT Education - 09/13/17 1122     Education provided  Yes    Education Details  Pt. educated on self-assisted suboccipital release technique, updated HEP    Person(s) Educated  Patient    Methods  Explanation;Demonstration;Handout    Comprehension  Verbalized understanding;Returned demonstration       PT Short Term Goals - 09/09/17 1514      PT SHORT TERM GOAL #1   Title  Pt. will be independent with I HEP    Time  4    Period  Weeks    Status  New    Target Date  10/07/17      PT SHORT TERM GOAL #2   Title  Pt. will increase cervical ROM >/= 5 degrees in all directions to demonstrate progress towards long-term goals.     Time  4    Period  Weeks    Status  New    Target Date  10/07/17      PT SHORT TERM GOAL #3   Title  Pt. will demonstrate proper posture in multiple positions to reduce neck pain.    Time  4    Period  Weeks    Status  New    Target Date  10/07/17       PT Long Term Goals - 09/09/17 1516      PT LONG TERM GOAL #1   Title  Pt. will have </= 2/10 pain in neck during lifting activities for increased ability to perform ADL's.    Time  8    Period  Weeks    Status  New    Target Date  11/04/17      PT LONG TERM GOAL #2   Title  Pt. will increase cervical rotation in both directions >/= 15 degrees to increase ability to drive without pain.     Time  8    Period  Weeks    Status  New    Target Date  11/04/17      PT LONG TERM GOAL #3   Title  Pt. will decrease FOTO score to </= 42% limited as of final visit.    Time  8    Period  Weeks    Target Date  11/04/17      PT LONG TERM GOAL #4   Title  Pt. will be independent with all prescribed HEP as of last visist.  Time  8    Period  Weeks    Status  New    Target Date  11/04/17        Plan - 09/13/17 1125    Clinical Impression Statement  Pt. reports today with 2-3/10 pain in neck and CC of reccurrent headache. Pt. still displays tightness of cervical spine and shoulder musculature which may be causing referred pain  manifesting as headache. Session focused on manual therapy for pain relief and progression of HEP. Pt. noted reduced pain post-session and recieved heat.    Rehab Potential  Fair    PT Frequency  2x / week    PT Duration  6 weeks    PT Treatment/Interventions  ADLs/Self Care Home Management;Moist Heat;Traction;Therapeutic exercise;Neuromuscular re-education;Patient/family education;Manual techniques;Passive range of motion;Taping    PT Next Visit Plan  Assess effect of HEP, manual techniques (soft tissue mobilization, joint mobilization), continued stretching, postural education     PT Home Exercise Plan  pec stretch, scalene stretch, 1st rib self-mobilization, upper trap stretch     Consulted and Agree with Plan of Care  Patient       Patient will benefit from skilled therapeutic intervention in order to improve the following deficits and impairments:  Hypomobility, Pain, Increased muscle spasms, Decreased range of motion, Impaired perceived functional ability, Postural dysfunction  Visit Diagnosis: Cervicalgia  Acute pain of right shoulder  Cramp and spasm     Problem List Patient Active Problem List   Diagnosis Date Noted  . DDD (degenerative disc disease), lumbar 04/17/2017  . Muscle spasms of neck 06/06/2016  . Glucose intolerance (impaired glucose tolerance) 04/25/2016  . Neuropathy 04/25/2016  . Recurrent boils 04/25/2016  . Hyperlipidemia 02/16/2016  . Chronic migraine 02/08/2016  . HTN (hypertension) 12/08/2015  . Stroke with cerebral ischemia ([place]) 10/02/2015  . Chest mass 04/17/2015  . Spinal stenosis of cervical region 10/25/2014  . Elevated liver function tests 10/04/2014  . Polyarthralgia 09/20/2014  . Hidradenitis suppurativa 07/21/2014  . Tear of medial meniscus of right knee 10/09/2013  . Carpal tunnel syndrome 07/17/2013  . Genital herpes 06/27/2007  . DISORDER, TOBACCO USE 06/27/2007  . HEADACHE 06/27/2007    Eulis Manly, SPT 09/13/17 11:39  AM    [place] [facility] [address] [place], [place], [address] Phone: [phone]   Fax:  [phone]  Name: Alicia Bolton MRN: [id number] Date of Birth: 1956/06/23

## 2017-09-15 ENCOUNTER — Encounter: Payer: Self-pay | Admitting: Family Medicine

## 2017-09-16 ENCOUNTER — Ambulatory Visit: Payer: Medicare HMO

## 2017-09-24 ENCOUNTER — Ambulatory Visit: Payer: Medicare HMO | Admitting: Physical Therapy

## 2017-09-24 ENCOUNTER — Encounter: Payer: Self-pay | Admitting: Physical Therapy

## 2017-09-24 DIAGNOSIS — M542 Cervicalgia: Secondary | ICD-10-CM

## 2017-09-24 DIAGNOSIS — M25511 Pain in right shoulder: Secondary | ICD-10-CM

## 2017-09-24 DIAGNOSIS — S161XXD Strain of muscle, fascia and tendon at neck level, subsequent encounter: Secondary | ICD-10-CM | POA: Diagnosis not present

## 2017-09-24 DIAGNOSIS — R252 Cramp and spasm: Secondary | ICD-10-CM

## 2017-09-24 DIAGNOSIS — M5402 Panniculitis affecting regions of neck and back, cervical region: Secondary | ICD-10-CM | POA: Diagnosis not present

## 2017-09-24 NOTE — Patient Instructions (Signed)
TENS stands for Transcutaneous Electrical Nerve Stimulation. In other words, electrical impulses are allowed to pass through the skin in order to excite a nerve.   Purpose and Use of TENS:  TENS is a method used to manage acute and chronic pain without the use of drugs. It has been effective in managing pain associated with surgery, sprains, strains, trauma, rheumatoid arthritis, and neuralgias. It is a non-addictive, low risk, and non-invasive technique used to control pain. It is not, by any means, a curative form of treatment.   How TENS Works:  Most TENS units are a small pocket-sized unit powered by one 9 volt battery. Attached to the outside of the unit are two lead wires where two pins and/or snaps connect on each wire. All units come with a set of four reusable pads or electrodes. These are placed on the skin surrounding the area involved. By inserting the leads into  the pads, the electricity can pass from the unit making the circuit complete.  As the intensity is turned up slowly, the electrical current enters the body from the electrodes through the skin to the surrounding nerve fibers. This triggers the release of hormones from within the body. These hormones contain pain relievers. By increasing the circulation of these hormones, the person's pain may be lessened. It is also believed that the electrical stimulation itself helps to block the pain messages being sent to the brain, thus also decreasing the body's perception of pain.   Hazards:  TENS units are NOT to be used by patients with PACEMAKERS, DEFIBRILLATORS, DIABETIC PUMPS, PREGNANT WOMEN, and patients with SEIZURE DISORDERS.  TENS units are NOT to be used over the heart, throat, brain, or spinal cord.  One of the major side effects from the TENS unit may be skin irritation. Some people may develop a rash if they are sensitive to the materials used in the electrodes or the connecting wires.     Avoid overuse due the body getting  used to the stem making it not as effective over time.     

## 2017-09-24 NOTE — Therapy (Signed)
[place], [place], [address] Phone: [phone]   Fax:  [phone]  Physical Therapy Treatment  Patient Details  Name: Alicia Bolton MRN: [id number] Date of Birth: 02/05/1956 Referring Provider: Vic Blackbird, MD   Encounter Date: 09/24/2017  PT End of Session - 09/24/17 0849    Visit Number  3    Number of Visits  12    Date for PT Re-Evaluation  10/21/17    PT Start Time  0800    PT Stop Time  0830 pt. had to leave for 9:00 appointment with physcian    PT Time Calculation (min)  30 min    Activity Tolerance  Patient tolerated treatment well    Behavior During Therapy  [facility] for tasks assessed/performed       Past Medical History:  Diagnosis Date  . Anxiety    takes Ativan daily as needed  . Arthritis   . Fibromyalgia   . History of bronchitis    few yrs ago  . History of colon polyps    benign  . History of migraine    last one over a month ago  . Hyperlipidemia    takes Atorvastatin daily  . Hypertension    takes Clonidine and HCTZ daily  . Joint pain   . Migraines   . Neuromuscular disorder ([place])   . Nocturia   . Stroke [facility]) 74/0814   Embolic Right ACA,  ILR placed Dr. Rayann Heman.States slight weakness in left    Past Surgical History:  Procedure Laterality Date  . ABDOMINAL HYSTERECTOMY  1997  . ANTERIOR CERVICAL DECOMP/DISCECTOMY FUSION N/A 10/25/2014   Procedure: CERVICAL THREE-FOUR, CERVICAL FOUR FIVE ANTERIOR CERVICAL DECOMPRESSION/DISCECTOMY FUSION 2 LEVEL/HARDWARE REMOVALCERVICAL FIVE-SEVEN;  Surgeon: Elaina Hoops, MD;  Location: [place] NEURO ORS;  Service: Neurosurgery;  Laterality: N/A;  . BREAST DUCTAL SYSTEM EXCISION Left 11/14/2016   Procedure: EXCISION DUCTAL SYSTEM LEFT BREAST;  Surgeon: Autumn Messing III, MD;  Location: [place];  Service: General;  Laterality: Left;  . CARPAL TUNNEL RELEASE Left 10/25/2014   Procedure: CARPAL TUNNEL RELEASE;  Surgeon: Elaina Hoops, MD;  Location:  [place] NEURO ORS;  Service: Neurosurgery;  Laterality: Left;  . CARPAL TUNNEL RELEASE Right   . CERVICAL FUSION  2007  . CESAREAN SECTION  1985  . CHEST SURGERY  04/2015   Benign chest mass removed- [place]  . COLONOSCOPY    . EP IMPLANTABLE DEVICE N/A 10/06/2015   Procedure: Loop Recorder Insertion;  Surgeon: Thompson Grayer, MD;  Location: [place] CV LAB;  Service: Cardiovascular;  Laterality: N/A;  . KNEE ARTHROSCOPY Right 1999  . KNEE ARTHROSCOPY WITH MEDIAL MENISECTOMY Right 10/09/2013   Procedure: RIGHT KNEE ARTHROSCOPY WITH MEDIAL MENISECTOMY;  Surgeon: Johnny Bridge, MD;  Location: [place];  Service: Orthopedics;  Laterality: Right;  clean  . TEE WITHOUT CARDIOVERSION N/A 10/05/2015   Procedure: TRANSESOPHAGEAL ECHOCARDIOGRAM (TEE);  Surgeon: Thayer Headings, MD;  Location: [facility] ENDOSCOPY;  Service: Cardiovascular;  Laterality: N/A;  . thumb surgery Right     There were no vitals filed for this visit.  Subjective Assessment - 09/24/17 0848    Subjective  Pt. currently reports 4/10 pain in neck that has remained constant for the past few days. Pt. reports that she has been completing prescribed stretches however "it is too early to tell if they are working."     Pertinent History  Pt. has  hx of previous neck surgery, disc fusion     Limitations  Lifting    How long can you sit comfortably?  unlimited    How long can you stand comfortably?  unlimited    How long can you walk comfortably?  unlimited     Diagnostic tests  -- no broken bones per pt. report    Currently in Pain?  Yes    Pain Score  4     Pain Location  Neck    Pain Orientation  Right    Pain Onset  1 to 4 weeks ago        [facility] Adult PT Treatment/Exercise - 09/24/17 0852      Self-Care   Self-Care  Other Self-Care Comments    Other Self-Care Comments   Pt. educated on lead placement and parameters for TENS unit. Pt. returned demonstration        PT Education -  09/24/17 0850    Education provided  Yes    Education Details  Pt. educated on rationale behind and how to use TENS unit.    Person(s) Educated  Patient    Methods  Explanation;Demonstration;Handout    Comprehension  Verbalized understanding;Returned demonstration       PT Short Term Goals - 09/09/17 1514      PT SHORT TERM GOAL #1   Title  Pt. will be independent with I HEP    Time  4    Period  Weeks    Status  New    Target Date  10/07/17      PT SHORT TERM GOAL #2   Title  Pt. will increase cervical ROM >/= 5 degrees in all directions to demonstrate progress towards long-term goals.     Time  4    Period  Weeks    Status  New    Target Date  10/07/17      PT SHORT TERM GOAL #3   Title  Pt. will demonstrate proper posture in multiple positions to reduce neck pain.    Time  4    Period  Weeks    Status  New    Target Date  10/07/17       PT Long Term Goals - 09/09/17 1516      PT LONG TERM GOAL #1   Title  Pt. will have </= 2/10 pain in neck during lifting activities for increased ability to perform ADL's.    Time  8    Period  Weeks    Status  New    Target Date  11/04/17      PT LONG TERM GOAL #2   Title  Pt. will increase cervical rotation in both directions >/= 15 degrees to increase ability to drive without pain.     Time  8    Period  Weeks    Status  New    Target Date  11/04/17      PT LONG TERM GOAL #3   Title  Pt. will decrease FOTO score to </= 42% limited as of final visit.    Time  8    Period  Weeks    Target Date  11/04/17      PT LONG TERM GOAL #4   Title  Pt. will be independent with all prescribed HEP as of last visist.    Time  8    Period  Weeks    Status  New    Target Date  11/04/17  Plan - 09/24/17 0904    Clinical Impression Statement  Today's treatment was limited due to pt. having to leave early to attend a physcian appointment. Pt. arrived with prescription for home TENS unit and session consisted of pt. education and  demonstration of proper use.     Rehab Potential  Fair    PT Frequency  2x / week    PT Duration  6 weeks    PT Treatment/Interventions  ADLs/Self Care Home Management;Moist Heat;Traction;Therapeutic exercise;Neuromuscular re-education;Patient/family education;Manual techniques;Passive range of motion;Taping    PT Next Visit Plan  Reassess HEP, manual techniques (soft tissue mobilization, joint mobilization), continued stretching, postural education, scapular stabalization exercises     PT Home Exercise Plan  pec stretch, scalene stretch, 1st rib self-mobilization, upper trap stretch     Consulted and Agree with Plan of Care  Patient       Patient will benefit from skilled therapeutic intervention in order to improve the following deficits and impairments:  Hypomobility, Pain, Increased muscle spasms, Decreased range of motion, Impaired perceived functional ability, Postural dysfunction  Visit Diagnosis: Cervicalgia  Acute pain of right shoulder  Cramp and spasm     Problem List Patient Active Problem List   Diagnosis Date Noted  . DDD (degenerative disc disease), lumbar 04/17/2017  . Muscle spasms of neck 06/06/2016  . Glucose intolerance (impaired glucose tolerance) 04/25/2016  . Neuropathy 04/25/2016  . Recurrent boils 04/25/2016  . Hyperlipidemia 02/16/2016  . Chronic migraine 02/08/2016  . HTN (hypertension) 12/08/2015  . Stroke with cerebral ischemia ([place]) 10/02/2015  . Chest mass 04/17/2015  . Spinal stenosis of cervical region 10/25/2014  . Elevated liver function tests 10/04/2014  . Polyarthralgia 09/20/2014  . Hidradenitis suppurativa 07/21/2014  . Tear of medial meniscus of right knee 10/09/2013  . Carpal tunnel syndrome 07/17/2013  . Genital herpes 06/27/2007  . DISORDER, TOBACCO USE 06/27/2007  . HEADACHE 06/27/2007     Eulis Manly, SPT 09/24/17 9:08 AM    [place] [facility] [address] [place], [place], [address] Phone: [phone]   Fax:  [phone]  Name: Alicia Bolton MRN: [id number] Date of Birth: 11/08/1955

## 2017-09-25 ENCOUNTER — Encounter: Payer: Self-pay | Admitting: Family Medicine

## 2017-09-26 ENCOUNTER — Encounter: Payer: Self-pay | Admitting: Physical Therapy

## 2017-09-26 ENCOUNTER — Ambulatory Visit (INDEPENDENT_AMBULATORY_CARE_PROVIDER_SITE_OTHER): Payer: Medicare HMO | Admitting: *Deleted

## 2017-09-26 ENCOUNTER — Ambulatory Visit: Payer: Medicare HMO | Admitting: Physical Therapy

## 2017-09-26 DIAGNOSIS — M25511 Pain in right shoulder: Secondary | ICD-10-CM

## 2017-09-26 DIAGNOSIS — R252 Cramp and spasm: Secondary | ICD-10-CM

## 2017-09-26 DIAGNOSIS — I639 Cerebral infarction, unspecified: Secondary | ICD-10-CM

## 2017-09-26 DIAGNOSIS — M542 Cervicalgia: Secondary | ICD-10-CM

## 2017-09-26 NOTE — Therapy (Signed)
[place], [place], [address] Phone: [phone]   Fax:  [phone]  Physical Therapy Treatment  Patient Details  Name: Alicia Bolton MRN: [id number] Date of Birth: 10-Jul-1956 Referring Provider: Vic Blackbird, MD   Encounter Date: 09/26/2017  PT End of Session - 09/26/17 1117    Visit Number  4    Number of Visits  12    Date for PT Re-Evaluation  10/21/17    PT Start Time  0930    PT Stop Time  1015    PT Time Calculation (min)  45 min    Activity Tolerance  Patient tolerated treatment well    Behavior During Therapy  [facility] for tasks assessed/performed       Past Medical History:  Diagnosis Date  . Anxiety    takes Ativan daily as needed  . Arthritis   . Fibromyalgia   . History of bronchitis    few yrs ago  . History of colon polyps    benign  . History of migraine    last one over a month ago  . Hyperlipidemia    takes Atorvastatin daily  . Hypertension    takes Clonidine and HCTZ daily  . Joint pain   . Migraines   . Neuromuscular disorder ([place])   . Nocturia   . Stroke [facility]) 56/3149   Embolic Right ACA,  ILR placed Dr. Rayann Heman.States slight weakness in left    Past Surgical History:  Procedure Laterality Date  . ABDOMINAL HYSTERECTOMY  1997  . ANTERIOR CERVICAL DECOMP/DISCECTOMY FUSION N/A 10/25/2014   Procedure: CERVICAL THREE-FOUR, CERVICAL FOUR FIVE ANTERIOR CERVICAL DECOMPRESSION/DISCECTOMY FUSION 2 LEVEL/HARDWARE REMOVALCERVICAL FIVE-SEVEN;  Surgeon: Elaina Hoops, MD;  Location: [place] NEURO ORS;  Service: Neurosurgery;  Laterality: N/A;  . BREAST DUCTAL SYSTEM EXCISION Left 11/14/2016   Procedure: EXCISION DUCTAL SYSTEM LEFT BREAST;  Surgeon: Autumn Messing III, MD;  Location: [place];  Service: General;  Laterality: Left;  . CARPAL TUNNEL RELEASE Left 10/25/2014   Procedure: CARPAL TUNNEL RELEASE;  Surgeon: Elaina Hoops, MD;  Location: [place] NEURO ORS;  Service: Neurosurgery;  Laterality:  Left;  . CARPAL TUNNEL RELEASE Right   . CERVICAL FUSION  2007  . CESAREAN SECTION  1985  . CHEST SURGERY  04/2015   Benign chest mass removed- [place]  . COLONOSCOPY    . EP IMPLANTABLE DEVICE N/A 10/06/2015   Procedure: Loop Recorder Insertion;  Surgeon: Thompson Grayer, MD;  Location: [place] CV LAB;  Service: Cardiovascular;  Laterality: N/A;  . KNEE ARTHROSCOPY Right 1999  . KNEE ARTHROSCOPY WITH MEDIAL MENISECTOMY Right 10/09/2013   Procedure: RIGHT KNEE ARTHROSCOPY WITH MEDIAL MENISECTOMY;  Surgeon: Johnny Bridge, MD;  Location: [place];  Service: Orthopedics;  Laterality: Right;  clean  . TEE WITHOUT CARDIOVERSION N/A 10/05/2015   Procedure: TRANSESOPHAGEAL ECHOCARDIOGRAM (TEE);  Surgeon: Thayer Headings, MD;  Location: [facility] ENDOSCOPY;  Service: Cardiovascular;  Laterality: N/A;  . thumb surgery Right     There were no vitals filed for this visit.  Subjective Assessment - 09/26/17 0938    Subjective  Pt. reports she has been dealing with a stomach issue over the past few days, however her neck "is feeling a little bit better". Pt. currently reports 2-3/10 pain and notes that the TENS unit and stretching have been helping, especially at night.     Currently in Pain?  Yes  Pain Score  3     Pain Location  Neck    Pain Orientation  Right       [facility] Adult PT Treatment/Exercise - 09/26/17 0950      Neck Exercises: Machines for Strengthening   UBE (Upper Arm Bike)  2 minutes pt. noted discomfort, cues to avoid excess shoulder hiking    Other Machines for Strengthening  NuStep using UE and LE, L6 x 4 minutes       Neck Exercises: Theraband   Rows  15 reps;Green 2 sets      Neck Exercises: Standing   Wall Push Ups  10 reps    Wall Push Ups Limitations  recived cues for proper hand placement; modified to use forearm and push away to work serratus anterior     Other Standing Exercises  standing "Y" exercise having pt. lift hands  away from wall to promote strengthening of lower trap      Cryotherapy   Number Minutes Cryotherapy  5 Minutes    Cryotherapy Location  Cervical;Shoulder    Type of Cryotherapy  Ice pack      Manual Therapy   Manual Therapy  Manual Traction    Manual Traction  cervical traction; pt. in supine; progressing into lower cervical rotation bilaterally; noted relief of symptoms with traction       PT Education - 09/26/17 1116    Education provided  Yes    Education Details  pt. education on stages of pain relief with ice     Person(s) Educated  Patient    Methods  Explanation    Comprehension  Verbalized understanding       PT Short Term Goals - 09/09/17 1514      PT SHORT TERM GOAL #1   Title  Pt. will be independent with I HEP    Time  4    Period  Weeks    Status  New    Target Date  10/07/17      PT SHORT TERM GOAL #2   Title  Pt. will increase cervical ROM >/= 5 degrees in all directions to demonstrate progress towards long-term goals.     Time  4    Period  Weeks    Status  New    Target Date  10/07/17      PT SHORT TERM GOAL #3   Title  Pt. will demonstrate proper posture in multiple positions to reduce neck pain.    Time  4    Period  Weeks    Status  New    Target Date  10/07/17       PT Long Term Goals - 09/09/17 1516      PT LONG TERM GOAL #1   Title  Pt. will have </= 2/10 pain in neck during lifting activities for increased ability to perform ADL's.    Time  8    Period  Weeks    Status  New    Target Date  11/04/17      PT LONG TERM GOAL #2   Title  Pt. will increase cervical rotation in both directions >/= 15 degrees to increase ability to drive without pain.     Time  8    Period  Weeks    Status  New    Target Date  11/04/17      PT LONG TERM GOAL #3   Title  Pt. will decrease FOTO score to </= 42% limited as of  final visit.    Time  8    Period  Weeks    Target Date  11/04/17      PT LONG TERM GOAL #4   Title  Pt. will be independent  with all prescribed HEP as of last visist.    Time  8    Period  Weeks    Status  New    Target Date  11/04/17       Plan - 09/26/17 1118    Clinical Impression Statement  Pt. reports to clinic with diminished pain vs. previous sessions. Treatment emphasized therapeutic exercise to stabilize cervical spine and surrounding musculature. Session also included manual cervical traction, with which the pt. noted relief of symptoms.    Rehab Potential  Fair    PT Frequency  2x / week    PT Duration  6 weeks    PT Treatment/Interventions  ADLs/Self Care Home Management;Moist Heat;Traction;Therapeutic exercise;Neuromuscular re-education;Patient/family education;Manual techniques;Passive range of motion;Taping    PT Next Visit Plan  Reassess HEP, manual techniques (soft tissue mobilization, joint mobilization), continued stretching, postural education, scapular stabalization exercises     PT Home Exercise Plan  pec stretch, scalene stretch, 1st rib self-mobilization, upper trap stretch     Consulted and Agree with Plan of Care  Patient       Patient will benefit from skilled therapeutic intervention in order to improve the following deficits and impairments:  Hypomobility, Pain, Increased muscle spasms, Decreased range of motion, Impaired perceived functional ability, Postural dysfunction  Visit Diagnosis: Cervicalgia  Acute pain of right shoulder  Cramp and spasm    Problem List Patient Active Problem List   Diagnosis Date Noted  . DDD (degenerative disc disease), lumbar 04/17/2017  . Muscle spasms of neck 06/06/2016  . Glucose intolerance (impaired glucose tolerance) 04/25/2016  . Neuropathy 04/25/2016  . Recurrent boils 04/25/2016  . Hyperlipidemia 02/16/2016  . Chronic migraine 02/08/2016  . HTN (hypertension) 12/08/2015  . Stroke with cerebral ischemia ([place]) 10/02/2015  . Chest mass 04/17/2015  . Spinal stenosis of cervical region 10/25/2014  . Elevated liver function tests  10/04/2014  . Polyarthralgia 09/20/2014  . Hidradenitis suppurativa 07/21/2014  . Tear of medial meniscus of right knee 10/09/2013  . Carpal tunnel syndrome 07/17/2013  . Genital herpes 06/27/2007  . DISORDER, TOBACCO USE 06/27/2007  . HEADACHE 06/27/2007    Eulis Manly, SPT 09/26/17 11:27 AM   [facility] [address] [place], [place], [address] Phone: [phone]   Fax:  [phone]  Name: Alicia Bolton MRN: [id number] Date of Birth: 1956/09/30

## 2017-09-27 NOTE — Progress Notes (Signed)
Carelink Summary Report / Loop Recorder 

## 2017-10-01 ENCOUNTER — Other Ambulatory Visit: Payer: Self-pay | Admitting: *Deleted

## 2017-10-01 MED ORDER — HYDROCHLOROTHIAZIDE 25 MG PO TABS: 25.0000 mg | ORAL_TABLET | Freq: Every day | ORAL | 0 refills | Status: DC

## 2017-10-02 ENCOUNTER — Encounter: Payer: Self-pay | Admitting: Physical Therapy

## 2017-10-02 ENCOUNTER — Ambulatory Visit: Payer: Medicare HMO | Attending: Family Medicine | Admitting: Physical Therapy

## 2017-10-02 DIAGNOSIS — M542 Cervicalgia: Secondary | ICD-10-CM | POA: Diagnosis not present

## 2017-10-02 DIAGNOSIS — M25511 Pain in right shoulder: Secondary | ICD-10-CM | POA: Diagnosis not present

## 2017-10-02 DIAGNOSIS — R252 Cramp and spasm: Secondary | ICD-10-CM | POA: Diagnosis not present

## 2017-10-02 NOTE — Therapy (Signed)
Alicia Bolton, [place], [address] Phone: [phone]   Fax:  [phone]  Physical Therapy Treatment  Patient Details  Name: Alicia Bolton MRN: [id number] Date of Birth: 09-26-1956 Referring Provider: Vic Blackbird, MD   Encounter Date: 10/02/2017  PT End of Session - 10/02/17 0852    Visit Number  5    Number of Visits  12    Date for PT Re-Evaluation  10/21/17    PT Start Time  0847    PT Stop Time  0935    PT Time Calculation (min)  48 min    Activity Tolerance  Patient tolerated treatment well    Behavior During Therapy  [facility] for tasks assessed/performed       Past Medical History:  Diagnosis Date  . Anxiety    takes Ativan daily as needed  . Arthritis   . Fibromyalgia   . History of bronchitis    few yrs ago  . History of colon polyps    benign  . History of migraine    last one over a month ago  . Hyperlipidemia    takes Atorvastatin daily  . Hypertension    takes Clonidine and HCTZ daily  . Joint pain   . Migraines   . Neuromuscular disorder ([place])   . Nocturia   . Stroke [facility]) 13/2440   Embolic Right ACA,  ILR placed Dr. Rayann Heman.States slight weakness in left    Past Surgical History:  Procedure Laterality Date  . ABDOMINAL HYSTERECTOMY  1997  . ANTERIOR CERVICAL DECOMP/DISCECTOMY FUSION N/A 10/25/2014   Procedure: CERVICAL THREE-FOUR, CERVICAL FOUR FIVE ANTERIOR CERVICAL DECOMPRESSION/DISCECTOMY FUSION 2 LEVEL/HARDWARE REMOVALCERVICAL FIVE-SEVEN;  Surgeon: Elaina Hoops, MD;  Location: [place] NEURO ORS;  Service: Neurosurgery;  Laterality: N/A;  . BREAST DUCTAL SYSTEM EXCISION Left 11/14/2016   Procedure: EXCISION DUCTAL SYSTEM LEFT BREAST;  Surgeon: Autumn Messing III, MD;  Location: [place];  Service: General;  Laterality: Left;  . CARPAL TUNNEL RELEASE Left 10/25/2014   Procedure: CARPAL TUNNEL RELEASE;  Surgeon: Elaina Hoops, MD;  Location: [place] NEURO ORS;  Service: Neurosurgery;  Laterality:  Left;  . CARPAL TUNNEL RELEASE Right   . CERVICAL FUSION  2007  . CESAREAN SECTION  1985  . CHEST SURGERY  04/2015   Benign chest mass removed- [place]  . COLONOSCOPY    . EP IMPLANTABLE DEVICE N/A 10/06/2015   Procedure: Loop Recorder Insertion;  Surgeon: Thompson Grayer, MD;  Location: [place] CV LAB;  Service: Cardiovascular;  Laterality: N/A;  . KNEE ARTHROSCOPY Right 1999  . KNEE ARTHROSCOPY WITH MEDIAL MENISECTOMY Right 10/09/2013   Procedure: RIGHT KNEE ARTHROSCOPY WITH MEDIAL MENISECTOMY;  Surgeon: Johnny Bridge, MD;  Location: [place];  Service: Orthopedics;  Laterality: Right;  clean  . TEE WITHOUT CARDIOVERSION N/A 10/05/2015   Procedure: TRANSESOPHAGEAL ECHOCARDIOGRAM (TEE);  Surgeon: Thayer Headings, MD;  Location: [facility] ENDOSCOPY;  Service: Cardiovascular;  Laterality: N/A;  . thumb surgery Right     There were no vitals filed for this visit.  Subjective Assessment - 10/02/17 0850    Subjective  "pt reports she is doing well, and had only 1 HA with N/T in the hand, she reports being consistent with her HEP which helps with the HA and N/T.    Currently in Pain?  Yes    Pain Score  2     Pain Orientation  Right  Pain Descriptors / Indicators  Tightness stiffness    Pain Frequency  Intermittent    Aggravating Factors   looking up, looking down for long periods of time.  reaching over head    Pain Relieving Factors  heat,                       [facility] Adult PT Treatment/Exercise - 10/02/17 0908      Shoulder Exercises: Supine   Horizontal ABduction  Strengthening;10 reps;Theraband verbal cues on proper form    Theraband Level (Shoulder Horizontal ABduction)  Level 1 (Yellow)    Other Supine Exercises  scapular retraction with ER bil    Other Supine Exercises  thoracic extension with bil UE flexion 2 x 10 with inhalation with bil shoulder flexion while laying perpendicular on foam roal       Moist Heat  Therapy   Number Minutes Moist Heat  10 Minutes    Moist Heat Location  Shoulder;Cervical supine      Manual Therapy   Manual Therapy  Neural Stretch    Manual therapy comments  MTPR along the scalenes on the L    Joint Mobilization  cerval C5-C7 PA and AP, R lateral gapping to the R at C5-C7, R 1st rib mobs with pt breathing in deep to promote MWM.     Manual Traction  x 5 min holding traction for 30 sec and releasing for 15 sec    Neural Stretch  Ulnar glides in the RUE             PT Education - 10/02/17 0935    Education provided  Yes    Education Details  updated HEP for include scapular stability exercise    Person(s) Educated  Patient    Methods  Explanation;Verbal cues;Handout    Comprehension  Verbalized understanding;Verbal cues required       PT Short Term Goals - 09/09/17 1514      PT SHORT TERM GOAL #1   Title  Pt. will be independent with I HEP    Time  4    Period  Weeks    Status  New    Target Date  10/07/17      PT SHORT TERM GOAL #2   Title  Pt. will increase cervical ROM >/= 5 degrees in all directions to demonstrate progress towards long-term goals.     Time  4    Period  Weeks    Status  New    Target Date  10/07/17      PT SHORT TERM GOAL #3   Title  Pt. will demonstrate proper posture in multiple positions to reduce neck pain.    Time  4    Period  Weeks    Status  New    Target Date  10/07/17        PT Long Term Goals - 09/09/17 1516      PT LONG TERM GOAL #1   Title  Pt. will have </= 2/10 pain in neck during lifting activities for increased ability to perform ADL's.    Time  8    Period  Weeks    Status  New    Target Date  11/04/17      PT LONG TERM GOAL #2   Title  Pt. will increase cervical rotation in both directions >/= 15 degrees to increase ability to drive without pain.     Time  8  Period  Weeks    Status  New    Target Date  11/04/17      PT LONG TERM GOAL #3   Title  Pt. will decrease FOTO score to </= 42%  limited as of final visit.    Time  8    Period  Weeks    Target Date  11/04/17      PT LONG TERM GOAL #4   Title  Pt. will be independent with all prescribed HEP as of last visist.    Time  8    Period  Weeks    Status  New    Target Date  11/04/17            Plan - 10/02/17 0936    Clinical Impression Statement  pt states she continueds to have soreness in the R shoulder with referral to the R hand but states it is relieved with stretching and her HEP. continued working on manual to promote cervical mobility and relieve muslce tightness via soft tissue techniques. She perfomred exercises reporting soreness in the neck with thoracic extension. she was able to do scapular stabilizing exercises with minimal report of soreness. MHP post session to calm down muscle tightness/ soreness.     PT Next Visit Plan  Reassess HEP, manual techniques (soft tissue mobilization, joint mobilization), continued stretching, postural education, scapular stabalization exercises, if nerve glides went well add them to HEP.     PT Home Exercise Plan  pec stretch, scalene stretch, 1st rib self-mobilization, upper trap stretch     Consulted and Agree with Plan of Care  Patient       Patient will benefit from skilled therapeutic intervention in order to improve the following deficits and impairments:  Hypomobility, Pain, Increased muscle spasms, Decreased range of motion, Impaired perceived functional ability, Postural dysfunction  Visit Diagnosis: Cervicalgia  Acute pain of right shoulder  Cramp and spasm     Problem List Patient Active Problem List   Diagnosis Date Noted  . DDD (degenerative disc disease), lumbar 04/17/2017  . Muscle spasms of neck 06/06/2016  . Glucose intolerance (impaired glucose tolerance) 04/25/2016  . Neuropathy 04/25/2016  . Recurrent boils 04/25/2016  . Hyperlipidemia 02/16/2016  . Chronic migraine 02/08/2016  . HTN (hypertension) 12/08/2015  . Stroke with cerebral  ischemia ([place]) 10/02/2015  . Chest mass 04/17/2015  . Spinal stenosis of cervical region 10/25/2014  . Elevated liver function tests 10/04/2014  . Polyarthralgia 09/20/2014  . Hidradenitis suppurativa 07/21/2014  . Tear of medial meniscus of right knee 10/09/2013  . Carpal tunnel syndrome 07/17/2013  . Genital herpes 06/27/2007  . DISORDER, TOBACCO USE 06/27/2007  . HEADACHE 06/27/2007   Starr Lake PT, DPT, LAT, ATC  10/02/17  9:45 AM      [facility] [address] [place], [place], [address] Phone: [phone]   Fax:  [phone]  Name: ARTIS BUECHELE MRN: [id number] Date of Birth: November 25, 1955

## 2017-10-03 ENCOUNTER — Ambulatory Visit: Payer: Medicare HMO | Admitting: Physical Therapy

## 2017-10-03 ENCOUNTER — Encounter: Payer: Self-pay | Admitting: Physical Therapy

## 2017-10-03 DIAGNOSIS — M542 Cervicalgia: Secondary | ICD-10-CM | POA: Diagnosis not present

## 2017-10-03 DIAGNOSIS — R252 Cramp and spasm: Secondary | ICD-10-CM

## 2017-10-03 DIAGNOSIS — M25511 Pain in right shoulder: Secondary | ICD-10-CM

## 2017-10-03 NOTE — Therapy (Signed)
[place], [place], [address] Phone: [phone]   Fax:  [phone]  Physical Therapy Treatment  Patient Details  Name: Alicia Bolton MRN: [id number] Date of Birth: 04-Sep-1956 Referring Provider: Vic Blackbird, MD   Encounter Date: 10/03/2017  PT End of Session - 10/03/17 0849    Visit Number  6    Number of Visits  12    Date for PT Re-Evaluation  10/21/17    PT Start Time  0846    PT Stop Time  0931    PT Time Calculation (min)  45 min    Activity Tolerance  Patient tolerated treatment well    Behavior During Therapy  [facility] for tasks assessed/performed       Past Medical History:  Diagnosis Date  . Anxiety    takes Ativan daily as needed  . Arthritis   . Fibromyalgia   . History of bronchitis    few yrs ago  . History of colon polyps    benign  . History of migraine    last one over a month ago  . Hyperlipidemia    takes Atorvastatin daily  . Hypertension    takes Clonidine and HCTZ daily  . Joint pain   . Migraines   . Neuromuscular disorder ([place])   . Nocturia   . Stroke [facility]) 73/5329   Embolic Right ACA,  ILR placed Dr. Rayann Heman.States slight weakness in left    Past Surgical History:  Procedure Laterality Date  . ABDOMINAL HYSTERECTOMY  1997  . ANTERIOR CERVICAL DECOMP/DISCECTOMY FUSION N/A 10/25/2014   Procedure: CERVICAL THREE-FOUR, CERVICAL FOUR FIVE ANTERIOR CERVICAL DECOMPRESSION/DISCECTOMY FUSION 2 LEVEL/HARDWARE REMOVALCERVICAL FIVE-SEVEN;  Surgeon: Elaina Hoops, MD;  Location: [place] NEURO ORS;  Service: Neurosurgery;  Laterality: N/A;  . BREAST DUCTAL SYSTEM EXCISION Left 11/14/2016   Procedure: EXCISION DUCTAL SYSTEM LEFT BREAST;  Surgeon: Autumn Messing III, MD;  Location: [place];  Service: General;  Laterality: Left;  . CARPAL TUNNEL RELEASE Left 10/25/2014   Procedure: CARPAL TUNNEL RELEASE;  Surgeon: Elaina Hoops, MD;  Location: [place] NEURO ORS;  Service: Neurosurgery;  Laterality:  Left;  . CARPAL TUNNEL RELEASE Right   . CERVICAL FUSION  2007  . CESAREAN SECTION  1985  . CHEST SURGERY  04/2015   Benign chest mass removed- [place]  . COLONOSCOPY    . EP IMPLANTABLE DEVICE N/A 10/06/2015   Procedure: Loop Recorder Insertion;  Surgeon: Thompson Grayer, MD;  Location: [place] CV LAB;  Service: Cardiovascular;  Laterality: N/A;  . KNEE ARTHROSCOPY Right 1999  . KNEE ARTHROSCOPY WITH MEDIAL MENISECTOMY Right 10/09/2013   Procedure: RIGHT KNEE ARTHROSCOPY WITH MEDIAL MENISECTOMY;  Surgeon: Johnny Bridge, MD;  Location: [place];  Service: Orthopedics;  Laterality: Right;  clean  . TEE WITHOUT CARDIOVERSION N/A 10/05/2015   Procedure: TRANSESOPHAGEAL ECHOCARDIOGRAM (TEE);  Surgeon: Thayer Headings, MD;  Location: [facility] ENDOSCOPY;  Service: Cardiovascular;  Laterality: N/A;  . thumb surgery Right     There were no vitals filed for this visit.  Subjective Assessment - 10/03/17 0849    Subjective  Pt. reports "today's a good day", however pt. is anticipating the incoming cold which she believes may aggrevate pain. Pt. reports noted improvement follwing yesterday's session and that her shoulder feels "looser".     Currently in Pain?  Yes    Pain Score  2     Pain Location  Neck    Pain Orientation  Right       [facility] Adult PT Treatment/Exercise - 10/03/17 0852      Neck Exercises: Machines for Strengthening   UBE (Upper Arm Bike)  L1 x 4 minutes, pt. was able to tolerate demonstrating improvement vs. previous attempt (11/29)      Neck Exercises: Standing   Other Standing Exercises  alternating shoulder flexion in scapular plane with cervical spine supporting ball on wall, 20 reps (10 reps each arm), 2 sets, 2nd set done with 1 lbs. weights       Neck Exercises: Seated   Other Seated Exercise  alternating shoulder flexion in scapular plane seated on dyna-disc maintaing proper spinal alignment, 20 reps (10 reps each arm), 2  sets, 2nd set completed with 1 lbs. weights      Neck Exercises: Supine   Neck Retraction  10 reps;5 secs    Neck Retraction Limitations  first attempted in seated with noted pain during movement, modified and re-attempted in supine with towel supporting head, pt. was able to complete 1 set with modification    Upper Extremity Perturbation  Both;20 secs in combination with scapular protraction      Neck Exercises: Prone   Other Prone Exercise  Prone I,T,Y, 10 reps, 1 set, bilaterally, pt. had to break between sets due to neck pain from laying in prone position however was able to complete sets      Shoulder Exercises: Seated   Other Seated Exercises  ulnar nerve flossing technique      Modalities   Modalities  Cryotherapy      Cryotherapy   Number Minutes Cryotherapy  5 Minutes    Cryotherapy Location  Cervical;Shoulder    Type of Cryotherapy  Ice pack       PT Education - 10/03/17 1049    Education provided  Yes    Education Details  upadated HEP to include ulnar nerve glide     Person(s) Educated  Patient    Methods  Explanation;Demonstration;Handout    Comprehension  Verbalized understanding;Returned demonstration       PT Short Term Goals - 09/09/17 1514      PT SHORT TERM GOAL #1   Title  Pt. will be independent with I HEP    Time  4    Period  Weeks    Status  New    Target Date  10/07/17      PT SHORT TERM GOAL #2   Title  Pt. will increase cervical ROM >/= 5 degrees in all directions to demonstrate progress towards long-term goals.     Time  4    Period  Weeks    Status  New    Target Date  10/07/17      PT SHORT TERM GOAL #3   Title  Pt. will demonstrate proper posture in multiple positions to reduce neck pain.    Time  4    Period  Weeks    Status  New    Target Date  10/07/17       PT Long Term Goals - 09/09/17 1516      PT LONG TERM GOAL #1   Title  Pt. will have </= 2/10 pain in neck during lifting activities for increased ability to perform  ADL's.    Time  8    Period  Weeks    Status  New    Target Date  11/04/17      PT  LONG TERM GOAL #2   Title  Pt. will increase cervical rotation in both directions >/= 15 degrees to increase ability to drive without pain.     Time  8    Period  Weeks    Status  New    Target Date  11/04/17      PT LONG TERM GOAL #3   Title  Pt. will decrease FOTO score to </= 42% limited as of final visit.    Time  8    Period  Weeks    Target Date  11/04/17      PT LONG TERM GOAL #4   Title  Pt. will be independent with all prescribed HEP as of last visist.    Time  8    Period  Weeks    Status  New    Target Date  11/04/17       Plan - 10/03/17 1051    Clinical Impression Statement  Pt. is progressing well and presents to today's session with decreased pain and soreness in R shoulder and neck. Session continued scapular and cervical spine stabilization exercises, which pt. tolerated well with ocassional modifications needed. Pt requested and recieved ice post-session.    PT Treatment/Interventions  ADLs/Self Care Home Management;Moist Heat;Traction;Therapeutic exercise;Neuromuscular re-education;Patient/family education;Manual techniques;Passive range of motion;Taping    PT Next Visit Plan  Reassess HEP, continued stretching, scapular stabalization exercises, thoracic spine exercise, manual techniques (soft tissue mobilization, joint mobilization) PRN     PT Home Exercise Plan  pec stretch, scalene stretch, 1st rib self-mobilization, upper trap stretch, ulnar nerve glide    Consulted and Agree with Plan of Care  Patient       Patient will benefit from skilled therapeutic intervention in order to improve the following deficits and impairments:  Hypomobility, Pain, Increased muscle spasms, Decreased range of motion, Impaired perceived functional ability, Postural dysfunction  Visit Diagnosis: Cervicalgia  Acute pain of right shoulder  Cramp and spasm     Problem List Patient Active  Problem List   Diagnosis Date Noted  . DDD (degenerative disc disease), lumbar 04/17/2017  . Muscle spasms of neck 06/06/2016  . Glucose intolerance (impaired glucose tolerance) 04/25/2016  . Neuropathy 04/25/2016  . Recurrent boils 04/25/2016  . Hyperlipidemia 02/16/2016  . Chronic migraine 02/08/2016  . HTN (hypertension) 12/08/2015  . Stroke with cerebral ischemia ([place]) 10/02/2015  . Chest mass 04/17/2015  . Spinal stenosis of cervical region 10/25/2014  . Elevated liver function tests 10/04/2014  . Polyarthralgia 09/20/2014  . Hidradenitis suppurativa 07/21/2014  . Tear of medial meniscus of right knee 10/09/2013  . Carpal tunnel syndrome 07/17/2013  . Genital herpes 06/27/2007  . DISORDER, TOBACCO USE 06/27/2007  . HEADACHE 06/27/2007    Eulis Manly, SPT 10/03/17 10:59 AM   [place] [facility] [address] [place], [place], [address] Phone: [phone]   Fax:  [phone]  Name: Alicia Bolton MRN: [id number] Date of Birth: [birth year]

## 2017-10-08 ENCOUNTER — Ambulatory Visit: Payer: Medicare HMO | Admitting: Physical Therapy

## 2017-10-08 ENCOUNTER — Encounter: Payer: Medicare HMO | Admitting: Physical Therapy

## 2017-10-09 ENCOUNTER — Ambulatory Visit: Payer: Medicare HMO | Admitting: Family Medicine

## 2017-10-10 ENCOUNTER — Encounter: Payer: Medicare HMO | Admitting: Physical Therapy

## 2017-10-10 ENCOUNTER — Ambulatory Visit: Payer: Medicare HMO | Admitting: Physical Therapy

## 2017-10-10 ENCOUNTER — Ambulatory Visit (INDEPENDENT_AMBULATORY_CARE_PROVIDER_SITE_OTHER): Payer: Medicare HMO | Admitting: Family Medicine

## 2017-10-10 ENCOUNTER — Other Ambulatory Visit: Payer: Self-pay

## 2017-10-10 ENCOUNTER — Encounter: Payer: Self-pay | Admitting: Family Medicine

## 2017-10-10 ENCOUNTER — Encounter: Payer: Self-pay | Admitting: Physical Therapy

## 2017-10-10 ENCOUNTER — Other Ambulatory Visit: Payer: Self-pay | Admitting: Family Medicine

## 2017-10-10 VITALS — BP 120/64 | HR 86 | Temp 98.3°F | Resp 16 | Ht 66.0 in | Wt 132.0 lb

## 2017-10-10 DIAGNOSIS — M542 Cervicalgia: Secondary | ICD-10-CM | POA: Diagnosis not present

## 2017-10-10 DIAGNOSIS — I10 Essential (primary) hypertension: Secondary | ICD-10-CM

## 2017-10-10 DIAGNOSIS — M4802 Spinal stenosis, cervical region: Secondary | ICD-10-CM | POA: Diagnosis not present

## 2017-10-10 DIAGNOSIS — R252 Cramp and spasm: Secondary | ICD-10-CM | POA: Diagnosis not present

## 2017-10-10 DIAGNOSIS — F411 Generalized anxiety disorder: Secondary | ICD-10-CM | POA: Diagnosis not present

## 2017-10-10 DIAGNOSIS — M25511 Pain in right shoulder: Secondary | ICD-10-CM | POA: Diagnosis not present

## 2017-10-10 HISTORY — DX: Generalized anxiety disorder: F41.1

## 2017-10-10 LAB — CUP PACEART REMOTE DEVICE CHECK
Implantable Pulse Generator Implant Date: 20161208
MDC IDC SESS DTM: 20181129203926

## 2017-10-10 MED ORDER — ATORVASTATIN CALCIUM 40 MG PO TABS: 40.0000 mg | ORAL_TABLET | Freq: Every day | ORAL | 2 refills | Status: DC

## 2017-10-10 MED ORDER — LORAZEPAM 0.5 MG PO TABS: 0.5000 mg | ORAL_TABLET | Freq: Two times a day (BID) | ORAL | 1 refills | Status: DC | PRN

## 2017-10-10 MED ORDER — TIZANIDINE HCL 4 MG PO TABS: 4.0000 mg | ORAL_TABLET | Freq: Four times a day (QID) | ORAL | 1 refills | Status: DC | PRN

## 2017-10-10 MED ORDER — HYDROCHLOROTHIAZIDE 25 MG PO TABS: 25.0000 mg | ORAL_TABLET | Freq: Every day | ORAL | 0 refills | Status: DC

## 2017-10-10 MED ORDER — HYDROCHLOROTHIAZIDE 25 MG PO TABS: 25.0000 mg | ORAL_TABLET | Freq: Every day | ORAL | 2 refills | Status: DC

## 2017-10-10 MED ORDER — HYDROCODONE-ACETAMINOPHEN 10-325 MG PO TABS: 1.0000 | ORAL_TABLET | Freq: Three times a day (TID) | ORAL | 0 refills | Status: DC | PRN

## 2017-10-10 MED ORDER — CLONIDINE HCL 0.1 MG PO TABS: 0.1000 mg | ORAL_TABLET | Freq: Two times a day (BID) | ORAL | 1 refills | Status: DC

## 2017-10-10 NOTE — Assessment & Plan Note (Signed)
Resume home medications including wellbutrin, buspar, gabapentin qHS, and trazodone qHS.  
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
## 2017-10-10 NOTE — Therapy (Signed)
[address] [place], [place], [address] Phone: [phone]   Fax:  [phone]  Physical Therapy Treatment  Patient Details  Name: Alicia Bolton MRN: [id number] Date of Birth: June 24, 1956 Referring Provider: Vic Blackbird, MD   Encounter Date: 10/10/2017  PT End of Session - 10/10/17 0844    Visit Number  7    Number of Visits  12    Date for PT Re-Evaluation  10/21/17    PT Start Time  0846    PT Stop Time  0938    PT Time Calculation (min)  52 min    Activity Tolerance  Patient tolerated treatment well    Behavior During Therapy  [facility] for tasks assessed/performed       Past Medical History:  Diagnosis Date  . Anxiety    takes Ativan daily as needed  . Arthritis   . Fibromyalgia   . History of bronchitis    few yrs ago  . History of colon polyps    benign  . History of migraine    last one over a month ago  . Hyperlipidemia    takes Atorvastatin daily  . Hypertension    takes Clonidine and HCTZ daily  . Joint pain   . Migraines   . Neuromuscular disorder ([place])   . Nocturia   . Stroke [facility]) 72/5366   Embolic Right ACA,  ILR placed Dr. Rayann Heman.States slight weakness in left    Past Surgical History:  Procedure Laterality Date  . ABDOMINAL HYSTERECTOMY  1997  . ANTERIOR CERVICAL DECOMP/DISCECTOMY FUSION N/A 10/25/2014   Procedure: CERVICAL THREE-FOUR, CERVICAL FOUR FIVE ANTERIOR CERVICAL DECOMPRESSION/DISCECTOMY FUSION 2 LEVEL/HARDWARE REMOVALCERVICAL FIVE-SEVEN;  Surgeon: Elaina Hoops, MD;  Location: [place] NEURO ORS;  Service: Neurosurgery;  Laterality: N/A;  . BREAST DUCTAL SYSTEM EXCISION Left 11/14/2016   Procedure: EXCISION DUCTAL SYSTEM LEFT BREAST;  Surgeon: Autumn Messing III, MD;  Location: [place];  Service: General;  Laterality: Left;  . CARPAL TUNNEL RELEASE Left 10/25/2014   Procedure: CARPAL TUNNEL RELEASE;  Surgeon: Elaina Hoops, MD;  Location: [place] NEURO ORS;  Service: Neurosurgery;  Laterality:  Left;  . CARPAL TUNNEL RELEASE Right   . CERVICAL FUSION  2007  . CESAREAN SECTION  1985  . CHEST SURGERY  04/2015   Benign chest mass removed- [place]  . COLONOSCOPY    . EP IMPLANTABLE DEVICE N/A 10/06/2015   Procedure: Loop Recorder Insertion;  Surgeon: Thompson Grayer, MD;  Location: [place] CV LAB;  Service: Cardiovascular;  Laterality: N/A;  . KNEE ARTHROSCOPY Right 1999  . KNEE ARTHROSCOPY WITH MEDIAL MENISECTOMY Right 10/09/2013   Procedure: RIGHT KNEE ARTHROSCOPY WITH MEDIAL MENISECTOMY;  Surgeon: Johnny Bridge, MD;  Location: [place];  Service: Orthopedics;  Laterality: Right;  clean  . TEE WITHOUT CARDIOVERSION N/A 10/05/2015   Procedure: TRANSESOPHAGEAL ECHOCARDIOGRAM (TEE);  Surgeon: Thayer Headings, MD;  [facility] ENDOSCOPY;  Service: Cardiovascular;  Laterality: N/A;  . thumb surgery Right     There were no vitals filed for this visit.  Subjective Assessment - 10/10/17 0845    Subjective  Pt. reports "it's a rough day today". She states she has an ongoing headache that began about 24 hours ago and has kept her up since 2:30 a.m. Pt. reports she has tried TENS, stretches and nothing has been able to relieve her symptoms. Pt. currently reports 6/10 pain in her neck.  Currently in Pain?  Yes    Pain Score  6     Pain Location  Neck    Pain Orientation  Right    Pain Radiating Towards  towards upper back, right shoulder        [facility] PT Assessment - 10/10/17 0936      AROM   AROM Assessment Site  Cervical    Cervical Flexion  25    Cervical Extension  20    Cervical - Right Side Bend  10 limited by pain    Cervical - Left Side Bend  18    Cervical - Right Rotation  9 limited by pain, 23 deg. post-treatment    Cervical - Left Rotation  13 limited by pain, 28 deg. post-treatment       [facility] Adult PT Treatment/Exercise - 10/10/17 0938      Modalities   Modalities  Moist Heat      Moist Heat Therapy   Number  Minutes Moist Heat  10 Minutes    Moist Heat Location  Shoulder;Cervical Rt. side, pt. seated upright      Manual Therapy   Manual Therapy  Manual Traction;Muscle Energy Technique;Taping    Manual therapy comments  skilled palpation; twitch repsonse monitored throughout dry needling    Soft tissue mobilization  IASTM rt. upper trapezius post dry needling     Manual Traction  hold traction 1 minute, rest 30 seconds, 5 reps     Muscle Energy Technique  bilateral upper trapezius, 3 reps each side, hold contraction 8 seconds before progressing to new range, hold in new range 30 seconds    McConnell  Rt. upper trapezius to inhibit muscle, promote benefits of DN       Trigger Point Dry Needling - 10/10/17 0939    Consent Given?  Yes    Education Handout Provided  Yes    Muscles Treated Upper Body  Upper trapezius Rt. side    Upper Trapezius Response  Twitch reponse elicited       PT Education - 10/10/17 0952    Education provided  Yes    Education Details  Pt. education on TPDN    Person(s) Educated  Patient    Methods  Explanation    Comprehension  Verbalized understanding       PT Short Term Goals - 10/10/17 0955      PT SHORT TERM GOAL #1   Title  Pt. will be independent with I HEP    Time  4    Period  Weeks    Status  Achieved      PT SHORT TERM GOAL #2   Title  Pt. will increase cervical ROM >/= 5 degrees in all directions to demonstrate progress towards long-term goals.     Time  4    Period  Weeks    Status  On-going      PT SHORT TERM GOAL #3   Title  Pt. will demonstrate proper posture in multiple positions to reduce neck pain.    Time  4    Period  Weeks    Status  On-going       PT Long Term Goals - 09/09/17 1516      PT LONG TERM GOAL #1   Title  Pt. will have </= 2/10 pain in neck during lifting activities for increased ability to perform ADL's.    Time  8    Period  Weeks    Status  New  Target Date  11/04/17      PT LONG TERM GOAL #2   Title   Pt. will increase cervical rotation in both directions >/= 15 degrees to increase ability to drive without pain.     Time  8    Period  Weeks    Status  New    Target Date  11/04/17      PT LONG TERM GOAL #3   Title  Pt. will decrease FOTO score to </= 42% limited as of final visit.    Time  8    Period  Weeks    Target Date  11/04/17      PT LONG TERM GOAL #4   Title  Pt. will be independent with all prescribed HEP as of last visist.    Time  8    Period  Weeks    Status  New    Target Date  11/04/17       Plan - 10/10/17 0955    Clinical Impression Statement  Pt. reports to session today with 6/10 pain in neck and CC of headache which limited ROM and treatment. Today's session involved manual therapy techniques for attempted pain relief and for improvement of cervical motion. Pt. received TPDN to rt. upper trapezius with palpable twitch response elicited. Pt. had positive response to manual therapy with increased cervical ROM post-treatment. Pt. noted decreased symptoms after the session and received moist heat.     PT Treatment/Interventions  ADLs/Self Care Home Management;Moist Heat;Traction;Therapeutic exercise;Neuromuscular re-education;Patient/family education;Manual techniques;Passive range of motion;Taping    PT Next Visit Plan  Reassess HEP, continued stretching, scapular stabalization exercises, thoracic spine exercise, manual techniques (soft tissue mobilization, joint mobilization) PRN, assess response to DN    PT Home Exercise Plan  pec stretch, scalene stretch, 1st rib self-mobilization, upper trap stretch, ulnar nerve glide    Consulted and Agree with Plan of Care  Patient       Patient will benefit from skilled therapeutic intervention in order to improve the following deficits and impairments:  Hypomobility, Pain, Increased muscle spasms, Decreased range of motion, Impaired perceived functional ability, Postural dysfunction  Visit Diagnosis: Cervicalgia  Acute  pain of right shoulder  Cramp and spasm     Problem List Patient Active Problem List   Diagnosis Date Noted  . DDD (degenerative disc disease), lumbar 04/17/2017  . Muscle spasms of neck 06/06/2016  . Glucose intolerance (impaired glucose tolerance) 04/25/2016  . Neuropathy 04/25/2016  . Recurrent boils 04/25/2016  . Hyperlipidemia 02/16/2016  . Chronic migraine 02/08/2016  . HTN (hypertension) 12/08/2015  . Stroke with cerebral ischemia ([place]) 10/02/2015  . Chest mass 04/17/2015  . Spinal stenosis of cervical region 10/25/2014  . Elevated liver function tests 10/04/2014  . Polyarthralgia 09/20/2014  . Hidradenitis suppurativa 07/21/2014  . Tear of medial meniscus of right knee 10/09/2013  . Carpal tunnel syndrome 07/17/2013  . Genital herpes 06/27/2007  . DISORDER, TOBACCO USE 06/27/2007  . HEADACHE 06/27/2007    Eulis Manly, SPT 10/10/17 10:08 AM   [facility] [facility] [address] [place], [place], [address] Phone: [phone]   Fax:  [phone]  Name: Alicia Bolton MRN: [id number] Date of Birth: 1956/02/16

## 2017-10-10 NOTE — Assessment & Plan Note (Signed)
Pain medication refilled Continue zanaflex Continue Physical therapy she is improving

## 2017-10-10 NOTE — Assessment & Plan Note (Signed)
Well controlled 

## 2017-10-10 NOTE — Patient Instructions (Signed)
F/U 4 months  

## 2017-10-15 ENCOUNTER — Ambulatory Visit: Payer: Medicare HMO | Admitting: Physical Therapy

## 2017-10-17 ENCOUNTER — Ambulatory Visit: Payer: Medicare HMO | Admitting: Physical Therapy

## 2017-10-24 ENCOUNTER — Ambulatory Visit: Payer: Medicare HMO | Admitting: Physical Therapy

## 2017-10-24 ENCOUNTER — Encounter: Payer: Self-pay | Admitting: Physical Therapy

## 2017-10-24 DIAGNOSIS — R252 Cramp and spasm: Secondary | ICD-10-CM

## 2017-10-24 DIAGNOSIS — M542 Cervicalgia: Secondary | ICD-10-CM | POA: Diagnosis not present

## 2017-10-24 DIAGNOSIS — M25511 Pain in right shoulder: Secondary | ICD-10-CM | POA: Diagnosis not present

## 2017-10-24 NOTE — Addendum Note (Signed)
Addended by: Larey Days on: 10/24/2017 08:57 AM   Modules accepted: Orders

## 2017-10-24 NOTE — Therapy (Signed)
[place], [place], [address] Phone: [phone]   Fax:  [phone]  Physical Therapy Treatment / Re-certification  Patient Details  Name: Alicia Bolton MRN: [id number] Date of Birth: [birth year] Referring Provider: Vic Blackbird, MD   Encounter Date: 10/24/2017  PT End of Session - 10/24/17 0807    Visit Number  8    Number of Visits  12    Date for PT Re-Evaluation  11/21/17    PT Start Time  0801    PT Stop Time  0850    PT Time Calculation (min)  49 min    Activity Tolerance  Patient tolerated treatment well    Behavior During Therapy  [facility] for tasks assessed/performed       Past Medical History:  Diagnosis Date  . Anxiety    takes Ativan daily as needed  . Arthritis   . Fibromyalgia   . History of bronchitis    few yrs ago  . History of colon polyps    benign  . History of migraine    last one over a month ago  . Hyperlipidemia    takes Atorvastatin daily  . Hypertension    takes Clonidine and HCTZ daily  . Joint pain   . Migraines   . Neuromuscular disorder ([place])   . Nocturia   . Stroke [facility]) 03/3015   Embolic Right ACA,  ILR placed Dr. Rayann Heman.States slight weakness in left    Past Surgical History:  Procedure Laterality Date  . ABDOMINAL HYSTERECTOMY  1997  . ANTERIOR CERVICAL DECOMP/DISCECTOMY FUSION N/A 10/25/2014   Procedure: CERVICAL THREE-FOUR, CERVICAL FOUR FIVE ANTERIOR CERVICAL DECOMPRESSION/DISCECTOMY FUSION 2 LEVEL/HARDWARE REMOVALCERVICAL FIVE-SEVEN;  Surgeon: Elaina Hoops, MD;  Location: [place] NEURO ORS;  Service: Neurosurgery;  Laterality: N/A;  . BREAST DUCTAL SYSTEM EXCISION Left 11/14/2016   Procedure: EXCISION DUCTAL SYSTEM LEFT BREAST;  Surgeon: Autumn Messing III, MD;  Location: [place];  Service: General;  Laterality: Left;  . CARPAL TUNNEL RELEASE Left 10/25/2014   Procedure: CARPAL TUNNEL RELEASE;  Surgeon: Elaina Hoops, MD;  Location: [place] NEURO ORS;  Service:  Neurosurgery;  Laterality: Left;  . CARPAL TUNNEL RELEASE Right   . CERVICAL FUSION  2007  . CESAREAN SECTION  1985  . CHEST SURGERY  04/2015   Benign chest mass removed- [place]  . COLONOSCOPY    . EP IMPLANTABLE DEVICE N/A 10/06/2015   Procedure: Loop Recorder Insertion;  Surgeon: Thompson Grayer, MD;  Location: [place] CV LAB;  Service: Cardiovascular;  Laterality: N/A;  . KNEE ARTHROSCOPY Right 1999  . KNEE ARTHROSCOPY WITH MEDIAL MENISECTOMY Right 10/09/2013   Procedure: RIGHT KNEE ARTHROSCOPY WITH MEDIAL MENISECTOMY;  Surgeon: Johnny Bridge, MD;  Location: [place];  Service: Orthopedics;  Laterality: Right;  clean  . TEE WITHOUT CARDIOVERSION N/A 10/05/2015   Procedure: TRANSESOPHAGEAL ECHOCARDIOGRAM (TEE);  Surgeon: Thayer Headings, MD;  Location: [facility] ENDOSCOPY;  Service: Cardiovascular;  Laterality: N/A;  . thumb surgery Right     There were no vitals filed for this visit.  Subjective Assessment - 10/24/17 0759    Subjective  "I was sick with the stomach flu over the last 2 weeks, and I have had increased neck pain and spasm. I had tried to work on doing the exercises and using the TENS unit which helped some"  pt reported relief of pain with DN and the tape helped.  Currently in Pain?  Yes    Pain Score  3     Pain Location  Neck    Pain Orientation  Right    Pain Descriptors / Indicators  Tightness    Pain Type  Chronic pain    Pain Onset  1 to 4 weeks ago    Pain Frequency  Intermittent    Aggravating Factors   raising arms above head, laying down    Pain Relieving Factors  heat, TENs unit, medication, exercise/ stretching         [facility] PT Assessment - 10/24/17 0811      Observation/Other Assessments   Focus on Therapeutic Outcomes (FOTO)   54% limited      AROM   Cervical Flexion  25    Cervical Extension  22    Cervical - Right Side Bend  18    Cervical - Left Side Bend  18    Cervical - Right Rotation  40     Cervical - Left Rotation  45                  [facility] Adult PT Treatment/Exercise - 10/24/17 0829      Neck Exercises: Supine   Neck Retraction  10 reps;5 secs      Manual Therapy   Manual therapy comments  skilled palpation and monitoring pt throughout DN    Joint Mobilization  T1-t7 grade 3 central PA    Soft tissue mobilization  IASTM along bil sub-occipitals/ cervical paraspinals  and upper trap    Manual Traction  --    McConnell  L upper trapezius to inhibit muscle      Neck Exercises: Stretches   Upper Trapezius Stretch  2 reps;30 seconds       Trigger Point Dry Needling - 10/24/17 0828    Consent Given?  Yes    Education Handout Provided  Yes    Muscles Treated Upper Body  Upper trapezius;Suboccipitals muscle group    Upper Trapezius Response  Twitch reponse elicited;Palpable increased muscle length    SubOccipitals Response  Twitch response elicited;Palpable increased muscle length           PT Education - 10/24/17 0851    Education provided  Yes    Education Details  pt progress with function and goals.     Person(s) Educated  Patient    Methods  Explanation    Comprehension  Verbalized understanding       PT Short Term Goals - 10/24/17 0815      PT SHORT TERM GOAL #1   Title  Pt. will be independent with I HEP    Time  4    Period  Weeks    Status  Achieved      PT SHORT TERM GOAL #2   Title  Pt. will increase cervical ROM >/= 5 degrees in all directions to demonstrate progress towards long-term goals.     Time  4    Period  Weeks    Status  Achieved      PT SHORT TERM GOAL #3   Title  Pt. will demonstrate proper posture in multiple positions to reduce neck pain.    Time  4    Period  Weeks    Status  Partially Met        PT Long Term Goals - 10/24/17 6222      PT LONG TERM GOAL #1   Title  Pt. will have </=  2/10 pain in neck during lifting activities for increased ability to perform ADL's.    Time  8    Period  Weeks     Status  On-going      PT LONG TERM GOAL #2   Title  Pt. will increase cervical rotation in both directions >/= 15 degrees to increase ability to drive without pain.     Time  8    Period  Weeks    Status  Achieved      PT LONG TERM GOAL #3   Title  Pt. will decrease FOTO score to </= 42% limited as of final visit.    Time  8    Period  Weeks    Status  On-going      PT LONG TERM GOAL #4   Title  Pt. will be independent with all prescribed HEP as of last visist.    Time  8    Period  Weeks    Status  On-going            Plan - 11/22/2017 0831    Clinical Impression Statement  pt reported she is doing better despite reporting being sick for the last 2 weeks and reported increased tightness in the shoulders. She is progressing well with PT increasing neck ROM especially with rotation bil. she is progressing towards goals appropriately. continued TPDN over the sub-occipitals and bil upper trap followed with soft tissue techniques, and joint mobs to promote thoracic extension. She would benefit from continued physical therapy to increase neck mobility, reduce pain and work toward remaining goals and independent exercise.     PT Frequency  1x / week    PT Duration  4 weeks    PT Treatment/Interventions  ADLs/Self Care Home Management;Moist Heat;Traction;Therapeutic exercise;Neuromuscular re-education;Patient/family education;Manual techniques;Passive range of motion;Taping    PT Next Visit Plan  update HEP PRN, scapular stabalization exercises, thoracic spine exercise, manual techniques (soft tissue mobilization, joint mobilization) PRN, assess response to DN    PT Home Exercise Plan  pec stretch, scalene stretch, 1st rib self-mobilization, upper trap stretch, ulnar nerve glide    Consulted and Agree with Plan of Care  Patient       Patient will benefit from skilled therapeutic intervention in order to improve the following deficits and impairments:  Hypomobility, Pain, Increased muscle  spasms, Decreased range of motion, Impaired perceived functional ability, Postural dysfunction  Visit Diagnosis: Cervicalgia  Acute pain of right shoulder  Cramp and spasm   G-Codes - 2017-11-22 0831    Functional Assessment Tool Used (Outpatient Only)  ROM, pain, FOTO/ Clinical judgement    Functional Limitation  Other PT primary    Other PT Primary Current Status (Y4034)  At least 40 percent but less than 60 percent impaired, limited or restricted    Other PT Primary Goal Status (V4259)  At least 20 percent but less than 40 percent impaired, limited or restricted       Problem List Patient Active Problem List   Diagnosis Date Noted  . GAD (generalized anxiety disorder) 10/10/2017  . DDD (degenerative disc disease), lumbar 04/17/2017  . Muscle spasms of neck 06/06/2016  . Glucose intolerance (impaired glucose tolerance) 04/25/2016  . Neuropathy 04/25/2016  . Recurrent boils 04/25/2016  . Hyperlipidemia 02/16/2016  . Chronic migraine 02/08/2016  . HTN (hypertension) 12/08/2015  . Stroke with cerebral ischemia ([place]) 10/02/2015  . Chest mass 04/17/2015  . Spinal stenosis of cervical region 10/25/2014  . Elevated liver function  tests 10/04/2014  . Polyarthralgia 09/20/2014  . Hidradenitis suppurativa 07/21/2014  . Tear of medial meniscus of right knee 10/09/2013  . Carpal tunnel syndrome 07/17/2013  . Genital herpes 06/27/2007  . DISORDER, TOBACCO USE 06/27/2007  . HEADACHE 06/27/2007   Starr Lake PT, DPT, LAT, ATC  10/24/17  8:55 AM      [facility] [address] [place], [place], [address] Phone: [phone]   Fax:  [phone]  Name: Alicia Bolton MRN: [id number] Date of Birth: [birth year]

## 2017-10-28 ENCOUNTER — Ambulatory Visit (INDEPENDENT_AMBULATORY_CARE_PROVIDER_SITE_OTHER): Payer: Medicare HMO | Admitting: *Deleted

## 2017-10-28 DIAGNOSIS — I639 Cerebral infarction, unspecified: Secondary | ICD-10-CM | POA: Diagnosis not present

## 2017-10-28 NOTE — Progress Notes (Signed)
Carelink Summary Report / Loop Recorder 

## 2017-11-05 ENCOUNTER — Ambulatory Visit: Payer: Medicare HMO | Attending: Family Medicine | Admitting: Physical Therapy

## 2017-11-05 ENCOUNTER — Encounter: Payer: Self-pay | Admitting: Physical Therapy

## 2017-11-05 DIAGNOSIS — M25511 Pain in right shoulder: Secondary | ICD-10-CM | POA: Insufficient documentation

## 2017-11-05 DIAGNOSIS — M542 Cervicalgia: Secondary | ICD-10-CM | POA: Insufficient documentation

## 2017-11-05 DIAGNOSIS — R252 Cramp and spasm: Secondary | ICD-10-CM | POA: Diagnosis not present

## 2017-11-05 NOTE — Therapy (Signed)
[place], [place], [address] Phone: [phone]   Fax:  [phone]  Physical Therapy Treatment  Patient Details  Name: Alicia Bolton MRN: [id number] Date of Birth: February 16, 1956 Referring Provider: Vic Blackbird, MD   Encounter Date: 11/05/2017  PT End of Session - 11/05/17 0933    Visit Number  9    Number of Visits  12    Date for PT Re-Evaluation  11/21/17    PT Start Time  0933    PT Stop Time  1013    PT Time Calculation (min)  40 min    Activity Tolerance  Patient tolerated treatment well    Behavior During Therapy  [facility] for tasks assessed/performed       Past Medical History:  Diagnosis Date  . Anxiety    takes Ativan daily as needed  . Arthritis   . Fibromyalgia   . History of bronchitis    few yrs ago  . History of colon polyps    benign  . History of migraine    last one over a month ago  . Hyperlipidemia    takes Atorvastatin daily  . Hypertension    takes Clonidine and HCTZ daily  . Joint pain   . Migraines   . Neuromuscular disorder ([place])   . Nocturia   . Stroke [facility]) 92/3300   Embolic Right ACA,  ILR placed Dr. Rayann Heman.States slight weakness in left    Past Surgical History:  Procedure Laterality Date  . ABDOMINAL HYSTERECTOMY  1997  . ANTERIOR CERVICAL DECOMP/DISCECTOMY FUSION N/A 10/25/2014   Procedure: CERVICAL THREE-FOUR, CERVICAL FOUR FIVE ANTERIOR CERVICAL DECOMPRESSION/DISCECTOMY FUSION 2 LEVEL/HARDWARE REMOVALCERVICAL FIVE-SEVEN;  Surgeon: Elaina Hoops, MD;  Location: [place] NEURO ORS;  Service: Neurosurgery;  Laterality: N/A;  . BREAST DUCTAL SYSTEM EXCISION Left 11/14/2016   Procedure: EXCISION DUCTAL SYSTEM LEFT BREAST;  Surgeon: Autumn Messing III, MD;  Location: [place];  Service: General;  Laterality: Left;  . CARPAL TUNNEL RELEASE Left 10/25/2014   Procedure: CARPAL TUNNEL RELEASE;  Surgeon: Elaina Hoops, MD;  Location: [place] NEURO ORS;  Service: Neurosurgery;  Laterality:  Left;  . CARPAL TUNNEL RELEASE Right   . CERVICAL FUSION  2007  . CESAREAN SECTION  1985  . CHEST SURGERY  04/2015   Benign chest mass removed- [place]  . COLONOSCOPY    . EP IMPLANTABLE DEVICE N/A 10/06/2015   Procedure: Loop Recorder Insertion;  Surgeon: Thompson Grayer, MD;  Location: [place] CV LAB;  Service: Cardiovascular;  Laterality: N/A;  . KNEE ARTHROSCOPY Right 1999  . KNEE ARTHROSCOPY WITH MEDIAL MENISECTOMY Right 10/09/2013   Procedure: RIGHT KNEE ARTHROSCOPY WITH MEDIAL MENISECTOMY;  Surgeon: Johnny Bridge, MD;  Location: [place];  Service: Orthopedics;  Laterality: Right;  clean  . TEE WITHOUT CARDIOVERSION N/A 10/05/2015   Procedure: TRANSESOPHAGEAL ECHOCARDIOGRAM (TEE);  Surgeon: Thayer Headings, MD;  Location: [facility] ENDOSCOPY;  Service: Cardiovascular;  Laterality: N/A;  . thumb surgery Right     There were no vitals filed for this visit.  Subjective Assessment - 11/05/17 0933    Subjective  "I feel I am doing better than I was. I've had a low grade HA for the last few days which I thought was my BP but it was been fine"     Currently in Pain?  Yes    Pain Score  3     Pain Location  Neck    Pain Orientation  Right    Pain Descriptors / Indicators  Tightness    Pain Type  Chronic pain    Pain Onset  1 to 4 weeks ago    Pain Frequency  Intermittent    Aggravating Factors   sleeping and relaxing,    Pain Relieving Factors  stretching, exercise, TENS unit                      [facility] Adult PT Treatment/Exercise - 11/05/17 0935      Neck Exercises: Machines for Strengthening   UBE (Upper Arm Bike)  L1 x 6 min changing direction at 3 min      Shoulder Exercises: Supine   Protraction  10 reps;Both 3# using dowel rod with tactile cues    Other Supine Exercises  lower trap Y strengthening with band around the legs 2 x 10 with red theraband, back stroke 2 x 10 with verbal cues for controlled breathing     Other Supine Exercises  thoracic extension with bil UE flexion 2 x 10 with inhalation with bil shoulder flexion      Shoulder Exercises: Stretch   Other Shoulder Stretches  upper trap stretch 2 x 30 sec,       Manual Therapy   Manual therapy comments  sub-occipital release 5 min    Joint Mobilization  T1-t7 grade 3 central PA, 1st and 2nd Rib mobs grade 3             PT Education - 11/05/17 1006    Education provided  Yes    Education Details  reviewed and updated HEP    Person(s) Educated  Patient    Methods  Explanation;Verbal cues    Comprehension  Verbalized understanding;Verbal cues required       PT Short Term Goals - 10/24/17 0815      PT SHORT TERM GOAL #1   Title  Pt. will be independent with I HEP    Time  4    Period  Weeks    Status  Achieved      PT SHORT TERM GOAL #2   Title  Pt. will increase cervical ROM >/= 5 degrees in all directions to demonstrate progress towards long-term goals.     Time  4    Period  Weeks    Status  Achieved      PT SHORT TERM GOAL #3   Title  Pt. will demonstrate proper posture in multiple positions to reduce neck pain.    Time  4    Period  Weeks    Status  Partially Met        PT Long Term Goals - 10/24/17 2355      PT LONG TERM GOAL #1   Title  Pt. will have </= 2/10 pain in neck during lifting activities for increased ability to perform ADL's.    Time  8    Period  Weeks    Status  On-going      PT LONG TERM GOAL #2   Title  Pt. will increase cervical rotation in both directions >/= 15 degrees to increase ability to drive without pain.     Time  8    Period  Weeks    Status  Achieved      PT LONG TERM GOAL #3   Title  Pt. will decrease FOTO score to </= 42% limited as of final visit.  Time  8    Period  Weeks    Status  On-going      PT LONG TERM GOAL #4   Title  Pt. will be independent with all prescribed HEP as of last visist.    Time  8    Period  Weeks    Status  On-going             Plan - 11/05/17 1006    Clinical Impression Statement  pt reports continued improvement since prevoius session. continued manual techniques in the cervical region to promote pain relief and tissue flexibility. she was able to perform scapular stabililty exericse and reported decreased pain and tightness at end or session reporting her HA was gone. Plan to assess next visit progress and possible D/C    PT Treatment/Interventions  ADLs/Self Care Home Management;Moist Heat;Traction;Therapeutic exercise;Neuromuscular re-education;Patient/family education;Manual techniques;Passive range of motion;Taping    PT Next Visit Plan  update HEP PRN, scapular stabalization exercises, thoracic spine exercise, manual techniques (soft tissue mobilization, joint mobilization) PRN, possible D/C    PT Home Exercise Plan  pec stretch, scalene stretch, 1st rib self-mobilization, upper trap stretch, ulnar nerve glide    Consulted and Agree with Plan of Care  Patient       Patient will benefit from skilled therapeutic intervention in order to improve the following deficits and impairments:  Hypomobility, Pain, Increased muscle spasms, Decreased range of motion, Impaired perceived functional ability, Postural dysfunction  Visit Diagnosis: Cervicalgia  Acute pain of right shoulder  Cramp and spasm     Problem List Patient Active Problem List   Diagnosis Date Noted  . GAD (generalized anxiety disorder) 10/10/2017  . DDD (degenerative disc disease), lumbar 04/17/2017  . Muscle spasms of neck 06/06/2016  . Glucose intolerance (impaired glucose tolerance) 04/25/2016  . Neuropathy 04/25/2016  . Recurrent boils 04/25/2016  . Hyperlipidemia 02/16/2016  . Chronic migraine 02/08/2016  . HTN (hypertension) 12/08/2015  . Stroke with cerebral ischemia ([place]) 10/02/2015  . Chest mass 04/17/2015  . Spinal stenosis of cervical region 10/25/2014  . Elevated liver function tests 10/04/2014  . Polyarthralgia  09/20/2014  . Hidradenitis suppurativa 07/21/2014  . Tear of medial meniscus of right knee 10/09/2013  . Carpal tunnel syndrome 07/17/2013  . Genital herpes 06/27/2007  . DISORDER, TOBACCO USE 06/27/2007  . HEADACHE 06/27/2007   Starr Lake PT, DPT, LAT, ATC  11/05/17  10:13 AM      [facility] [address] [place], [place], [address] Phone: [phone]   Fax:  [phone]  Name: Alicia Bolton MRN: [id number] Date of Birth: 05/13/1956

## 2017-11-07 LAB — CUP PACEART REMOTE DEVICE CHECK
Date Time Interrogation Session: 20181229204137
MDC IDC PG IMPLANT DT: 20161208

## 2017-11-12 ENCOUNTER — Ambulatory Visit: Payer: Medicare HMO | Admitting: Physical Therapy

## 2017-11-12 ENCOUNTER — Encounter: Payer: Self-pay | Admitting: Physical Therapy

## 2017-11-12 DIAGNOSIS — M25511 Pain in right shoulder: Secondary | ICD-10-CM

## 2017-11-12 DIAGNOSIS — R252 Cramp and spasm: Secondary | ICD-10-CM | POA: Diagnosis not present

## 2017-11-12 DIAGNOSIS — M542 Cervicalgia: Secondary | ICD-10-CM

## 2017-11-12 NOTE — Therapy (Signed)
[place], [place], [address] Phone: [phone]   Fax:  [phone]  Physical Therapy Treatment  Patient Details  Name: Alicia Bolton MRN: [id number] Date of Birth: [birth year] Referring Provider: Vic Blackbird, MD   Encounter Date: 11/12/2017  PT End of Session - 11/12/17 0925    Visit Number  10    Number of Visits  12    Date for PT Re-Evaluation  11/21/17    PT Start Time  0928    PT Stop Time  1009    PT Time Calculation (min)  41 min    Activity Tolerance  Patient tolerated treatment well    Behavior During Therapy  [facility] for tasks assessed/performed       Past Medical History:  Diagnosis Date  . Anxiety    takes Ativan daily as needed  . Arthritis   . Fibromyalgia   . History of bronchitis    few yrs ago  . History of colon polyps    benign  . History of migraine    last one over a month ago  . Hyperlipidemia    takes Atorvastatin daily  . Hypertension    takes Clonidine and HCTZ daily  . Joint pain   . Migraines   . Neuromuscular disorder ([place])   . Nocturia   . Stroke [facility]) 67/6195   Embolic Right ACA,  ILR placed Dr. Rayann Heman.States slight weakness in left    Past Surgical History:  Procedure Laterality Date  . ABDOMINAL HYSTERECTOMY  1997  . ANTERIOR CERVICAL DECOMP/DISCECTOMY FUSION N/A 10/25/2014   Procedure: CERVICAL THREE-FOUR, CERVICAL FOUR FIVE ANTERIOR CERVICAL DECOMPRESSION/DISCECTOMY FUSION 2 LEVEL/HARDWARE REMOVALCERVICAL FIVE-SEVEN;  Surgeon: Elaina Hoops, MD;  Location: [place] NEURO ORS;  Service: Neurosurgery;  Laterality: N/A;  . BREAST DUCTAL SYSTEM EXCISION Left 11/14/2016   Procedure: EXCISION DUCTAL SYSTEM LEFT BREAST;  Surgeon: Autumn Messing III, MD;  Location: [place];  Service: General;  Laterality: Left;  . CARPAL TUNNEL RELEASE Left 10/25/2014   Procedure: CARPAL TUNNEL RELEASE;  Surgeon: Elaina Hoops, MD;  Location: [place] NEURO ORS;  Service: Neurosurgery;  Laterality:  Left;  . CARPAL TUNNEL RELEASE Right   . CERVICAL FUSION  2007  . CESAREAN SECTION  1985  . CHEST SURGERY  04/2015   Benign chest mass removed- [place]  . COLONOSCOPY    . EP IMPLANTABLE DEVICE N/A 10/06/2015   Procedure: Loop Recorder Insertion;  Surgeon: Thompson Grayer, MD;  Location: [place] CV LAB;  Service: Cardiovascular;  Laterality: N/A;  . KNEE ARTHROSCOPY Right 1999  . KNEE ARTHROSCOPY WITH MEDIAL MENISECTOMY Right 10/09/2013   Procedure: RIGHT KNEE ARTHROSCOPY WITH MEDIAL MENISECTOMY;  Surgeon: Johnny Bridge, MD;  Location: [place];  Service: Orthopedics;  Laterality: Right;  clean  . TEE WITHOUT CARDIOVERSION N/A 10/05/2015   Procedure: TRANSESOPHAGEAL ECHOCARDIOGRAM (TEE);  Surgeon: Thayer Headings, MD;  Location: [facility] ENDOSCOPY;  Service: Cardiovascular;  Laterality: N/A;  . thumb surgery Right     There were no vitals filed for this visit.  Subjective Assessment - 11/12/17 0926    Subjective  " I am doing better, but I am still having low grade HA intermittent which I think it is due to the way I am sleeping"     Currently in Pain?  Yes    Pain Score  3     Pain Location  Neck    Pain  Orientation  Right    Pain Descriptors / Indicators  Tightness    Pain Type  Chronic pain    Pain Onset  1 to 4 weeks ago    Aggravating Factors   sleeping / laying down    Pain Relieving Factors  stretching, exercise, TENS unit                      [facility] Adult PT Treatment/Exercise - 11/12/17 0932      Neck Exercises: Machines for Strengthening   UBE (Upper Arm Bike)  L1 x 6 min  changing direction at 3 min      Shoulder Exercises: Supine   Other Supine Exercises  supine scapulothoracic routine, ceiling punches, horizontal abd/ add, alternating ceiling punches, back stroke 1 x 15 each with double rolled towel beneath thoracic spine      Shoulder Exercises: Standing   External Rotation  Strengthening;Both;10  reps;Theraband    Theraband Level (Shoulder External Rotation)  Level 2 (Red)    Internal Rotation  Both;Theraband;10 reps    Theraband Level (Shoulder Internal Rotation)  Level 2 (Red)    Other Standing Exercises  lower trap wall lift off exercise 2 x 10 holding 3 sec ea.      Neck Exercises: Stretches   Upper Trapezius Stretch  2 reps;30 seconds    Levator Stretch  2 reps;30 seconds    Other Neck Stretches  rhomboid stretch from door 2 x 30 sec with             PT Education - 11/12/17 0948    Education provided  Yes    Education Details  reviewed and updated HEP    Person(s) Educated  Patient    Methods  Explanation;Verbal cues;Handout    Comprehension  Verbalized understanding;Verbal cues required       PT Short Term Goals - 10/24/17 0815      PT SHORT TERM GOAL #1   Title  Pt. will be independent with I HEP    Time  4    Period  Weeks    Status  Achieved      PT SHORT TERM GOAL #2   Title  Pt. will increase cervical ROM >/= 5 degrees in all directions to demonstrate progress towards long-term goals.     Time  4    Period  Weeks    Status  Achieved      PT SHORT TERM GOAL #3   Title  Pt. will demonstrate proper posture in multiple positions to reduce neck pain.    Time  4    Period  Weeks    Status  Partially Met        PT Long Term Goals - 10/24/17 8676      PT LONG TERM GOAL #1   Title  Pt. will have </= 2/10 pain in neck during lifting activities for increased ability to perform ADL's.    Time  8    Period  Weeks    Status  On-going      PT LONG TERM GOAL #2   Title  Pt. will increase cervical rotation in both directions >/= 15 degrees to increase ability to drive without pain.     Time  8    Period  Weeks    Status  Achieved      PT LONG TERM GOAL #3   Title  Pt. will decrease FOTO score to </= 42% limited as of final  visit.    Time  8    Period  Weeks    Status  On-going      PT LONG TERM GOAL #4   Title  Pt. will be independent with all  prescribed HEP as of last visist.    Time  8    Period  Weeks    Status  On-going            Plan - 11/12/17 0956    Clinical Impression Statement  pt reports continued intermittent low grade HA which she attributes to sleeping positions. Focused primarly on shoulder/ thoracic stability and which she fatigued quickly with rotator cuff strengthening and required verbal cues for breathing. post session she reported decreased pain and reduce HA.     PT Next Visit Plan  update HEP PRN, scapular stabalization exercises, thoracic spine exercise, manual techniques (soft tissue mobilization, joint mobilization) PRN, possible D/C    PT Home Exercise Plan  pec stretch, scalene stretch, 1st rib self-mobilization, upper trap stretch, ulnar nerve glide, shoulder IR/ER, lower trap strengthening, Rhomboid stretch.     Consulted and Agree with Plan of Care  Patient       Patient will benefit from skilled therapeutic intervention in order to improve the following deficits and impairments:  Hypomobility, Pain, Increased muscle spasms, Decreased range of motion, Impaired perceived functional ability, Postural dysfunction  Visit Diagnosis: Cervicalgia  Acute pain of right shoulder  Cramp and spasm     Problem List Patient Active Problem List   Diagnosis Date Noted  . GAD (generalized anxiety disorder) 10/10/2017  . DDD (degenerative disc disease), lumbar 04/17/2017  . Muscle spasms of neck 06/06/2016  . Glucose intolerance (impaired glucose tolerance) 04/25/2016  . Neuropathy 04/25/2016  . Recurrent boils 04/25/2016  . Hyperlipidemia 02/16/2016  . Chronic migraine 02/08/2016  . HTN (hypertension) 12/08/2015  . Stroke with cerebral ischemia ([place]) 10/02/2015  . Chest mass 04/17/2015  . Spinal stenosis of cervical region 10/25/2014  . Elevated liver function tests 10/04/2014  . Polyarthralgia 09/20/2014  . Hidradenitis suppurativa 07/21/2014  . Tear of medial meniscus of right knee  10/09/2013  . Carpal tunnel syndrome 07/17/2013  . Genital herpes 06/27/2007  . DISORDER, TOBACCO USE 06/27/2007  . HEADACHE 06/27/2007   Starr Lake PT, DPT, LAT, ATC  11/12/17  10:10 AM      [facility] [address] [place], [place], [address] Phone: [phone]   Fax:  [phone]  Name: Alicia Bolton MRN: [id number] Date of Birth: [birth year]

## 2017-11-15 ENCOUNTER — Other Ambulatory Visit: Payer: Self-pay

## 2017-11-15 ENCOUNTER — Ambulatory Visit (INDEPENDENT_AMBULATORY_CARE_PROVIDER_SITE_OTHER): Payer: Medicare HMO | Admitting: Family Medicine

## 2017-11-15 ENCOUNTER — Encounter: Payer: Self-pay | Admitting: Family Medicine

## 2017-11-15 VITALS — BP 128/62 | HR 86 | Temp 98.3°F | Resp 16 | Ht 66.0 in | Wt 131.0 lb

## 2017-11-15 DIAGNOSIS — M542 Cervicalgia: Secondary | ICD-10-CM

## 2017-11-15 DIAGNOSIS — M4802 Spinal stenosis, cervical region: Secondary | ICD-10-CM

## 2017-11-15 MED ORDER — HYDROCODONE-ACETAMINOPHEN 10-325 MG PO TABS: 1.0000 | ORAL_TABLET | Freq: Three times a day (TID) | ORAL | 0 refills | Status: DC | PRN

## 2017-11-15 MED ORDER — TIZANIDINE HCL 4 MG PO TABS: 4.0000 mg | ORAL_TABLET | Freq: Four times a day (QID) | ORAL | 1 refills | Status: DC | PRN
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
## 2017-11-15 NOTE — Patient Instructions (Addendum)
F/U  3 months 
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
## 2017-11-19 ENCOUNTER — Ambulatory Visit: Payer: Medicare HMO | Admitting: Physical Therapy

## 2017-11-19 ENCOUNTER — Encounter: Payer: Self-pay | Admitting: Physical Therapy

## 2017-11-19 DIAGNOSIS — R252 Cramp and spasm: Secondary | ICD-10-CM

## 2017-11-19 DIAGNOSIS — M25511 Pain in right shoulder: Secondary | ICD-10-CM

## 2017-11-19 DIAGNOSIS — M542 Cervicalgia: Secondary | ICD-10-CM

## 2017-11-19 NOTE — Therapy (Signed)
[name], [place], [address] Phone: [phone]   Fax:  [phone]  Physical Therapy Treatment  Patient Details  Name: Alicia Bolton MRN: [id number] Date of Birth: 05-31-1956 Referring Provider: Vic Blackbird, MD   Encounter Date: 11/19/2017  PT End of Session - 11/19/17 0931    Visit Number  11    Number of Visits  12    Date for PT Re-Evaluation  11/21/17    PT Start Time  0931    PT Stop Time  1015    PT Time Calculation (min)  44 min    Activity Tolerance  Patient tolerated treatment well    Behavior During Therapy  [facility] for tasks assessed/performed       Past Medical History:  Diagnosis Date  . Anxiety    takes Ativan daily as needed  . Arthritis   . Fibromyalgia   . History of bronchitis    few yrs ago  . History of colon polyps    benign  . History of migraine    last one over a month ago  . Hyperlipidemia    takes Atorvastatin daily  . Hypertension    takes Clonidine and HCTZ daily  . Joint pain   . Migraines   . Neuromuscular disorder ([place])   . Nocturia   . Stroke [facility]) 70/1779   Embolic Right ACA,  ILR placed Dr. Rayann Heman.States slight weakness in left    Past Surgical History:  Procedure Laterality Date  . ABDOMINAL HYSTERECTOMY  1997  . ANTERIOR CERVICAL DECOMP/DISCECTOMY FUSION N/A 10/25/2014   Procedure: CERVICAL THREE-FOUR, CERVICAL FOUR FIVE ANTERIOR CERVICAL DECOMPRESSION/DISCECTOMY FUSION 2 LEVEL/HARDWARE REMOVALCERVICAL FIVE-SEVEN;  Surgeon: Elaina Hoops, MD;  Location: [place] NEURO ORS;  Service: Neurosurgery;  Laterality: N/A;  . BREAST DUCTAL SYSTEM EXCISION Left 11/14/2016   Procedure: EXCISION DUCTAL SYSTEM LEFT BREAST;  Surgeon: Autumn Messing III, MD;  Location: [place];  Service: General;  Laterality: Left;  . CARPAL TUNNEL RELEASE Left 10/25/2014   Procedure: CARPAL TUNNEL RELEASE;  Surgeon: Elaina Hoops, MD;  Location: [place] NEURO ORS;  Service: Neurosurgery;  Laterality:  Left;  . CARPAL TUNNEL RELEASE Right   . CERVICAL FUSION  2007  . CESAREAN SECTION  1985  . CHEST SURGERY  04/2015   Benign chest mass removed- [place]  . COLONOSCOPY    . EP IMPLANTABLE DEVICE N/A 10/06/2015   Procedure: Loop Recorder Insertion;  Surgeon: Thompson Grayer, MD;  Location: [place] CV LAB;  Service: Cardiovascular;  Laterality: N/A;  . KNEE ARTHROSCOPY Right 1999  . KNEE ARTHROSCOPY WITH MEDIAL MENISECTOMY Right 10/09/2013   Procedure: RIGHT KNEE ARTHROSCOPY WITH MEDIAL MENISECTOMY;  Surgeon: Johnny Bridge, MD;  Location: [place];  Service: Orthopedics;  Laterality: Right;  clean  . TEE WITHOUT CARDIOVERSION N/A 10/05/2015   Procedure: TRANSESOPHAGEAL ECHOCARDIOGRAM (TEE);  Surgeon: Thayer Headings, MD;  Location[facility]COPY;  Service: Cardiovascular;  Laterality: N/A;  . thumb surgery Right     There were no vitals filed for this visit.  Subjective Assessment - 11/19/17 0931    Subjective  "I am having a headache today but I think it is due to over doing it"     Currently in Pain?  Yes    Pain Score  4     Pain Location  Knee    Pain Orientation  Right    Pain Descriptors / Indicators  Tightness    Pain Onset  1 to 4 weeks ago    Pain Frequency  Intermittent    Aggravating Factors   sleeping / laying down                      [facility] Adult PT Treatment/Exercise - 11/19/17 0944      Self-Care   Other Self-Care Comments   education on MTPR techniques to reduce muscle tightness      Neck Exercises: Machines for Strengthening   UBE (Upper Arm Bike)  L1 x 6 min  changing directions at 3 min      Neck Exercises: Seated   Other Seated Exercise  SNAGS 2 x 10 holding 5 sec each how to perofrm at home using towel, demonstation/ vebal cues      Shoulder Exercises: Supine   Protraction  15 reps;Weights;Both    Protraction Weight (lbs)  3    Other Supine Exercises  external rotation with scapular  retraction 2 x 15 with green theraband      Manual Therapy   Manual therapy comments  MTPR release techniques in levator scap, and upper trap and self care of how to perform at home             PT Education - 11/19/17 0943    Education provided  Yes    Education Details  MTPR release techniques and benefits a how to use tools to relieve tightness and where the tools can be purchased. Updated HEP for cervical snags.    Person(s) Educated  Patient    Methods  Explanation;Verbal cues    Comprehension  Verbalized understanding;Verbal cues required       PT Short Term Goals - 10/24/17 0815      PT SHORT TERM GOAL #1   Title  Pt. will be independent with I HEP    Time  4    Period  Weeks    Status  Achieved      PT SHORT TERM GOAL #2   Title  Pt. will increase cervical ROM >/= 5 degrees in all directions to demonstrate progress towards long-term goals.     Time  4    Period  Weeks    Status  Achieved      PT SHORT TERM GOAL #3   Title  Pt. will demonstrate proper posture in multiple positions to reduce neck pain.    Time  4    Period  Weeks    Status  Partially Met        PT Long Term Goals - 10/24/17 2122      PT LONG TERM GOAL #1   Title  Pt. will have </= 2/10 pain in neck during lifting activities for increased ability to perform ADL's.    Time  8    Period  Weeks    Status  On-going      PT LONG TERM GOAL #2   Title  Pt. will increase cervical rotation in both directions >/= 15 degrees to increase ability to drive without pain.     Time  8    Period  Weeks    Status  Achieved      PT LONG TERM GOAL #3   Title  Pt. will decrease FOTO score to </= 42% limited as of final visit.    Time  8    Period  Weeks    Status  On-going      PT  LONG TERM GOAL #4   Title  Pt. will be independent with all prescribed HEP as of last visist.    Time  8    Period  Weeks    Status  On-going            Plan - 11/19/17 0959    Clinical Impression Statement  pt  arrived reporting 4/10 pain due to HA. following self MTPR techniques and exercise/ stretching she reported decreased pain and HA. updated HEP for self SNAG exercise with verbal cues for proper form/ rationale. she was able to perform all exercise well. Discussed possible D/C next visit.     PT Treatment/Interventions  ADLs/Self Care Home Management;Moist Heat;Traction;Therapeutic exercise;Neuromuscular re-education;Patient/family education;Manual techniques;Passive range of motion;Taping    PT Next Visit Plan  update HEP PRN, scapular stabalization exercises, thoracic spine exercise, manual techniques (soft tissue mobilization, joint mobilization) PRN, possible D/C    PT Home Exercise Plan  pec stretch, scalene stretch, 1st rib self-mobilization, upper trap stretch, ulnar nerve glide, shoulder IR/ER, lower trap strengthening, Rhomboid stretch. SNAGs    Consulted and Agree with Plan of Care  Patient       Patient will benefit from skilled therapeutic intervention in order to improve the following deficits and impairments:  Hypomobility, Pain, Increased muscle spasms, Decreased range of motion, Impaired perceived functional ability, Postural dysfunction  Visit Diagnosis: Cervicalgia  Acute pain of right shoulder  Cramp and spasm     Problem List Patient Active Problem List   Diagnosis Date Noted  . GAD (generalized anxiety disorder) 10/10/2017  . DDD (degenerative disc disease), lumbar 04/17/2017  . Muscle spasms of neck 06/06/2016  . Glucose intolerance (impaired glucose tolerance) 04/25/2016  . Neuropathy 04/25/2016  . Recurrent boils 04/25/2016  . Hyperlipidemia 02/16/2016  . Chronic migraine 02/08/2016  . HTN (hypertension) 12/08/2015  . Stroke with cerebral ischemia ([place]) 10/02/2015  . Chest mass 04/17/2015  . Spinal stenosis of cervical region 10/25/2014  . Elevated liver function tests 10/04/2014  . Polyarthralgia 09/20/2014  . Hidradenitis suppurativa 07/21/2014  . Tear  of medial meniscus of right knee 10/09/2013  . Carpal tunnel syndrome 07/17/2013  . Genital herpes 06/27/2007  . DISORDER, TOBACCO USE 06/27/2007  . HEADACHE 06/27/2007   Starr Lake PT, DPT, LAT, ATC  11/19/17  10:10 AM      [facility] [address] [place], [place], [address] Phone: [phone]   Fax:  [phone]  Name: RODOLFO NOTARO MRN: [id number] Date of Birth: 04/25/1956

## 2017-11-21 ENCOUNTER — Encounter: Payer: Self-pay | Admitting: Family Medicine

## 2017-11-25 ENCOUNTER — Ambulatory Visit (INDEPENDENT_AMBULATORY_CARE_PROVIDER_SITE_OTHER): Payer: Medicare HMO | Admitting: *Deleted

## 2017-11-25 DIAGNOSIS — I639 Cerebral infarction, unspecified: Secondary | ICD-10-CM

## 2017-11-26 ENCOUNTER — Encounter: Payer: Self-pay | Admitting: Physical Therapy

## 2017-11-26 ENCOUNTER — Ambulatory Visit: Payer: Medicare HMO | Admitting: Physical Therapy

## 2017-11-26 DIAGNOSIS — M542 Cervicalgia: Secondary | ICD-10-CM

## 2017-11-26 DIAGNOSIS — M25511 Pain in right shoulder: Secondary | ICD-10-CM

## 2017-11-26 DIAGNOSIS — R252 Cramp and spasm: Secondary | ICD-10-CM | POA: Diagnosis not present

## 2017-11-26 NOTE — Therapy (Signed)
[place], [place], [address] Phone: [phone]   Fax:  [phone]  Physical Therapy Treatment / Discharge Summary  Patient Details  Name: Alicia Bolton MRN: [id number] Date of Birth: 1956-07-05 Referring Provider: Vic Blackbird, MD   Encounter Date: 11/26/2017  PT End of Session - 11/26/17 1020    Visit Number  12    Number of Visits  12    Date for PT Re-Evaluation  11/21/17    PT Start Time  1016    PT Stop Time  1058    PT Time Calculation (min)  42 min    Activity Tolerance  Patient tolerated treatment well    Behavior During Therapy  [facility] for tasks assessed/performed       Past Medical History:  Diagnosis Date  . Anxiety    takes Ativan daily as needed  . Arthritis   . Fibromyalgia   . History of bronchitis    few yrs ago  . History of colon polyps    benign  . History of migraine    last one over a month ago  . Hyperlipidemia    takes Atorvastatin daily  . Hypertension    takes Clonidine and HCTZ daily  . Joint pain   . Migraines   . Neuromuscular disorder ([place])   . Nocturia   . Stroke [facility]) 81/4481   Embolic Right ACA,  ILR placed Dr. Rayann Heman.States slight weakness in left    Past Surgical History:  Procedure Laterality Date  . ABDOMINAL HYSTERECTOMY  1997  . ANTERIOR CERVICAL DECOMP/DISCECTOMY FUSION N/A 10/25/2014   Procedure: CERVICAL THREE-FOUR, CERVICAL FOUR FIVE ANTERIOR CERVICAL DECOMPRESSION/DISCECTOMY FUSION 2 LEVEL/HARDWARE REMOVALCERVICAL FIVE-SEVEN;  Surgeon: Elaina Hoops, MD;  Location: [place] NEURO ORS;  Service: Neurosurgery;  Laterality: N/A;  . BREAST DUCTAL SYSTEM EXCISION Left 11/14/2016   Procedure: EXCISION DUCTAL SYSTEM LEFT BREAST;  Surgeon: Autumn Messing III, MD;  Location: [place];  Service: General;  Laterality: Left;  . CARPAL TUNNEL RELEASE Left 10/25/2014   Procedure: CARPAL TUNNEL RELEASE;  Surgeon: Elaina Hoops, MD;  Location: [place] NEURO ORS;  Service:  Neurosurgery;  Laterality: Left;  . CARPAL TUNNEL RELEASE Right   . CERVICAL FUSION  2007  . CESAREAN SECTION  1985  . CHEST SURGERY  04/2015   Benign chest mass removed- [place]  . COLONOSCOPY    . EP IMPLANTABLE DEVICE N/A 10/06/2015   Procedure: Loop Recorder Insertion;  Surgeon: Thompson Grayer, MD;  Location: [place] CV LAB;  Service: Cardiovascular;  Laterality: N/A;  . KNEE ARTHROSCOPY Right 1999  . KNEE ARTHROSCOPY WITH MEDIAL MENISECTOMY Right 10/09/2013   Procedure: RIGHT KNEE ARTHROSCOPY WITH MEDIAL MENISECTOMY;  Surgeon: Johnny Bridge, MD;  Location: [place];  Service: Orthopedics;  Laterality: Right;  clean  . TEE WITHOUT CARDIOVERSION N/A 10/05/2015   Procedure: TRANSESOPHAGEAL ECHOCARDIOGRAM (TEE);  Surgeon: Thayer Headings, MD;  Location: [facility];  Service: Cardiovascular;  Laterality: N/A;  . thumb surgery Right     There were no vitals filed for this visit.  Subjective Assessment - 11/26/17 1019    Subjective  "I am having good days and bad days, more good then bad. I can tell I am getting stronger"     Currently in Pain?  Yes    Pain Score  2     Pain Location  Neck    Pain Orientation  Right  Aggravating Factors   sleeping/ laying     Pain Relieving Factors  stretching, exercise, TENS         [facility] PT Assessment - 11/26/17 1036      AROM   Cervical Flexion  32    Cervical Extension  30    Cervical - Right Side Bend  30    Cervical - Left Side Bend  22    Cervical - Right Rotation  51    Cervical - Left Rotation  50                  [facility] Adult PT Treatment/Exercise - 11/26/17 1024      Neck Exercises: Machines for Strengthening   UBE (Upper Arm Bike)  L3 x 6 min  changing direction at 3 min      Shoulder Exercises: Seated   Other Seated Exercises  horizontal abduction with blue band 1 x 10, first rib mobs using sheet with breathing in/ out     Other Seated Exercises  lower trap  strengtheing slideup/ down wall with lift off 1 x 10      Shoulder Exercises: Stretch   Other Shoulder Stretches  upper trap stretch 2 x 30 sec,     Other Shoulder Stretches  levator scapulae stretching 2 x 30 sec             PT Education - 11/26/17 1055    Education provided  Yes    Education Details  reviewed previously provided HEP and how to progress strengthening/ endurnace by increasing reps/ sets or resistance. benefits of returning to gym and gradually increasing exercise.     Person(s) Educated  Patient    Methods  Explanation;Verbal cues;Handout    Comprehension  Verbalized understanding;Verbal cues required       PT Short Term Goals - 11/26/17 1040      PT SHORT TERM GOAL #1   Title  Pt. will be independent with I HEP    Time  4    Period  Weeks    Status  Achieved      PT SHORT TERM GOAL #2   Title  Pt. will increase cervical ROM >/= 5 degrees in all directions to demonstrate progress towards long-term goals.     Time  4    Period  Weeks    Status  Achieved      PT SHORT TERM GOAL #3   Title  Pt. will demonstrate proper posture in multiple positions to reduce neck pain.    Time  4    Period  Weeks    Status  Achieved        PT Long Term Goals - 11/26/17 1041      PT LONG TERM GOAL #1   Title  Pt. will have </= 2/10 pain in neck during lifting activities for increased ability to perform ADL's.    Period  Weeks    Status  Achieved      PT LONG TERM GOAL #2   Title  Pt. will increase cervical rotation in both directions >/= 15 degrees to increase ability to drive without pain.     Time  8    Period  Weeks    Status  Achieved      PT LONG TERM GOAL #3   Title  Pt. will decrease FOTO score to </= 42% limited as of final visit.    Period  Weeks    Status  Partially Met  PT LONG TERM GOAL #4   Title  Pt. will be independent with all prescribed HEP as of last visist.    Time  8    Period  Weeks    Status  Achieved            Plan -  11/26/17 1054    Clinical Impression Statement  Alicia Bolton has made great progess with physical therapy meeting or partially meeting all goals and reports max pain at 2/10 but reports is controlled with stretching/ exercise. reviewed previously provided HEP which she performed well. she is able to maintain and progress her current level of function independently and will be discharged from Pt today.     PT Next Visit Plan  D/C    PT Home Exercise Plan  pec stretch, scalene stretch, 1st rib self-mobilization, upper trap stretch, ulnar nerve glide, shoulder IR/ER, lower trap strengthening, Rhomboid stretch. SNAGs    Consulted and Agree with Plan of Care  Patient       Patient will benefit from skilled therapeutic intervention in order to improve the following deficits and impairments:  Hypomobility, Pain, Increased muscle spasms, Decreased range of motion, Impaired perceived functional ability, Postural dysfunction  Visit Diagnosis: Cervicalgia  Acute pain of right shoulder  Cramp and spasm     Problem List Patient Active Problem List   Diagnosis Date Noted  . GAD (generalized anxiety disorder) 10/10/2017  . DDD (degenerative disc disease), lumbar 04/17/2017  . Muscle spasms of neck 06/06/2016  . Glucose intolerance (impaired glucose tolerance) 04/25/2016  . Neuropathy 04/25/2016  . Recurrent boils 04/25/2016  . Hyperlipidemia 02/16/2016  . Chronic migraine 02/08/2016  . HTN (hypertension) 12/08/2015  . Stroke with cerebral ischemia ([place]) 10/02/2015  . Chest mass 04/17/2015  . Spinal stenosis of cervical region 10/25/2014  . Elevated liver function tests 10/04/2014  . Polyarthralgia 09/20/2014  . Hidradenitis suppurativa 07/21/2014  . Tear of medial meniscus of right knee 10/09/2013  . Carpal tunnel syndrome 07/17/2013  . Genital herpes 06/27/2007  . DISORDER, TOBACCO USE 06/27/2007  . HEADACHE 06/27/2007   Starr Lake PT, DPT, LAT, ATC  11/26/17  11:00  AM      [facility] [address] [place], [place], [address] Phone: [phone]   Fax:  [phone]  Name: Alicia Bolton MRN: [id number] Date of Birth: [birth year]        PHYSICAL THERAPY DISCHARGE SUMMARY  Visits from Start of Care: 12  Current functional level related to goals / functional outcomes: See goals, FOTO 56% limited   Remaining deficits: Intermittent tightness in the R upper trap controlled with stretching/ exercise, see above assessment   Education / Equipment: HEP, theraband, posture  Plan: Patient agrees to discharge.  Patient goals were partially met. Patient is being discharged due to being pleased with the current functional level.  ?????         Johngabriel Verde PT, DPT, LAT, ATC  11/26/17  11:01 AM

## 2017-11-26 NOTE — Progress Notes (Signed)
Carelink Summary Report / Loop Recorder 

## 2017-12-03 ENCOUNTER — Encounter: Payer: Self-pay | Admitting: Family Medicine

## 2017-12-03 ENCOUNTER — Encounter: Payer: Medicare HMO | Admitting: Physical Therapy

## 2017-12-05 LAB — CUP PACEART REMOTE DEVICE CHECK
Implantable Pulse Generator Implant Date: 20161208
MDC IDC SESS DTM: 20190128211022

## 2017-12-10 ENCOUNTER — Encounter: Payer: Medicare HMO | Admitting: Physical Therapy

## 2017-12-14 ENCOUNTER — Encounter: Payer: Self-pay | Admitting: Family Medicine

## 2017-12-17 ENCOUNTER — Ambulatory Visit: Payer: Medicare HMO | Admitting: Neurology

## 2017-12-17 ENCOUNTER — Encounter: Payer: Self-pay | Admitting: Neurology

## 2017-12-17 VITALS — BP 125/65 | HR 78 | Ht 66.0 in | Wt 134.0 lb

## 2017-12-17 DIAGNOSIS — I639 Cerebral infarction, unspecified: Secondary | ICD-10-CM

## 2017-12-17 DIAGNOSIS — I10 Essential (primary) hypertension: Secondary | ICD-10-CM | POA: Diagnosis not present

## 2017-12-17 DIAGNOSIS — E782 Mixed hyperlipidemia: Secondary | ICD-10-CM
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
## 2017-12-17 NOTE — Patient Instructions (Addendum)
I had a long d/w patient about his recent stroke, risk for recurrent stroke/TIAs, personally independently reviewed imaging studies and stroke evaluation results and answered questions.Continue aspirin 325 mg daily  for secondary stroke prevention and maintain strict control of hypertension with blood pressure goal below 130/90, diabetes with hemoglobin A1c goal below 6.5% and lipids with LDL cholesterol goal below 70 mg/dL. I also advised the patient to eat a healthy diet with plenty of whole grains, cereals, fruits and vegetables, exercise regularly and maintain ideal body weight Followup in the future with me in as needed  -continue to follow up with PCP regarding stroke risk factors  -continue to follow loop recorder  -continue to check blood pressure at home  -follow up with [place] only as needed

## 2017-12-27 ENCOUNTER — Telehealth: Payer: Self-pay | Admitting: Physical Therapy

## 2017-12-27 NOTE — Telephone Encounter (Signed)
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 Discussed using a trigger point release toold and where she can locate it. Also to be mindful of posture because it is easy to fall back into old habits especially when combined with the stress of driving.

## 2017-12-30 ENCOUNTER — Ambulatory Visit (INDEPENDENT_AMBULATORY_CARE_PROVIDER_SITE_OTHER): Payer: Medicare HMO | Admitting: *Deleted

## 2017-12-30 DIAGNOSIS — I639 Cerebral infarction, unspecified: Secondary | ICD-10-CM

## 2017-12-30 NOTE — Progress Notes (Signed)
Carelink Summary Report / Loop Recorder 

## 2018-01-03 ENCOUNTER — Encounter: Payer: Self-pay | Admitting: Family Medicine

## 2018-01-03 MED ORDER — HYDROCODONE-ACETAMINOPHEN 10-325 MG PO TABS: 1.0000 | ORAL_TABLET | Freq: Three times a day (TID) | ORAL | 0 refills | Status: DC | PRN

## 2018-01-03 MED ORDER — MELOXICAM 7.5 MG PO TABS: 7.5000 mg | ORAL_TABLET | Freq: Every day | ORAL | 0 refills | Status: DC

## 2018-01-03 NOTE — Telephone Encounter (Signed)
Ok to refill Hydrocodone??  Last office visit/ refill 11/15/2017.  I do not see Mobic on medication list. Ok to order?

## 2018-01-22 ENCOUNTER — Ambulatory Visit: Payer: Medicare HMO | Admitting: Family Medicine

## 2018-01-30 ENCOUNTER — Ambulatory Visit (INDEPENDENT_AMBULATORY_CARE_PROVIDER_SITE_OTHER): Payer: Medicare HMO | Admitting: *Deleted

## 2018-01-30 DIAGNOSIS — I639 Cerebral infarction, unspecified: Secondary | ICD-10-CM

## 2018-01-31 ENCOUNTER — Encounter: Payer: Self-pay | Admitting: Family Medicine

## 2018-01-31 ENCOUNTER — Ambulatory Visit (INDEPENDENT_AMBULATORY_CARE_PROVIDER_SITE_OTHER): Payer: Medicare HMO | Admitting: Family Medicine

## 2018-01-31 ENCOUNTER — Other Ambulatory Visit: Payer: Self-pay

## 2018-01-31 VITALS — BP 118/78 | HR 96 | Temp 98.0°F | Resp 14 | Ht 66.0 in | Wt 133.0 lb

## 2018-01-31 DIAGNOSIS — M5136 Other intervertebral disc degeneration, lumbar region: Secondary | ICD-10-CM

## 2018-01-31 DIAGNOSIS — S161XXD Strain of muscle, fascia and tendon at neck level, subsequent encounter: Secondary | ICD-10-CM | POA: Diagnosis not present

## 2018-01-31 DIAGNOSIS — M4802 Spinal stenosis, cervical region: Secondary | ICD-10-CM | POA: Diagnosis not present

## 2018-01-31 DIAGNOSIS — I10 Essential (primary) hypertension: Secondary | ICD-10-CM

## 2018-01-31 DIAGNOSIS — M5402 Panniculitis affecting regions of neck and back, cervical region: Secondary | ICD-10-CM | POA: Diagnosis not present

## 2018-01-31 MED ORDER — HYDROCODONE-ACETAMINOPHEN 10-325 MG PO TABS: 1.0000 | ORAL_TABLET | Freq: Three times a day (TID) | ORAL | 0 refills | Status: DC | PRN
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
## 2018-01-31 NOTE — Patient Instructions (Signed)
F/u 4 MONTHS  

## 2018-02-02 ENCOUNTER — Encounter: Payer: Self-pay | Admitting: Family Medicine

## 2018-02-02 NOTE — Assessment & Plan Note (Signed)
Controlled no changes 
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
## 2018-02-03 NOTE — Progress Notes (Signed)
Carelink Summary Report / Loop Recorder 

## 2018-02-07 LAB — CUP PACEART REMOTE DEVICE CHECK
Date Time Interrogation Session: 20190302214054
Implantable Pulse Generator Implant Date: 20161208

## 2018-02-18 ENCOUNTER — Ambulatory Visit: Payer: Medicare HMO | Admitting: Family Medicine

## 2018-02-24 ENCOUNTER — Encounter: Payer: Self-pay | Admitting: Family Medicine

## 2018-02-25 ENCOUNTER — Other Ambulatory Visit: Payer: Self-pay | Admitting: Family Medicine

## 2018-02-25 ENCOUNTER — Encounter: Payer: Self-pay | Admitting: Family Medicine

## 2018-02-25 MED ORDER — HYDROCODONE-ACETAMINOPHEN 10-325 MG PO TABS: 1.0000 | ORAL_TABLET | Freq: Three times a day (TID) | ORAL | 0 refills | Status: DC | PRN

## 2018-02-25 MED ORDER — MELOXICAM 7.5 MG PO TABS: 7.5000 mg | ORAL_TABLET | Freq: Every day | ORAL | 0 refills | Status: DC

## 2018-02-25 NOTE — Telephone Encounter (Signed)
Call pt will refill norco, but needs to make this last a full 30 days

## 2018-02-25 NOTE — Telephone Encounter (Signed)
Ok to refill??  Last office visit/ refill on Hydrocodone/APAP 01/31/2018.

## 2018-03-04 ENCOUNTER — Ambulatory Visit (INDEPENDENT_AMBULATORY_CARE_PROVIDER_SITE_OTHER): Payer: Medicare HMO | Admitting: *Deleted

## 2018-03-04 DIAGNOSIS — I639 Cerebral infarction, unspecified: Secondary | ICD-10-CM

## 2018-03-05 LAB — CUP PACEART REMOTE DEVICE CHECK
Implantable Pulse Generator Implant Date: 20161208
MDC IDC SESS DTM: 20190404221034

## 2018-03-05 NOTE — Progress Notes (Signed)
Carelink Summary Report / Loop Recorder 

## 2018-03-25 LAB — CUP PACEART REMOTE DEVICE CHECK
Implantable Pulse Generator Implant Date: 20161208
MDC IDC SESS DTM: 20190507220723

## 2018-04-06 ENCOUNTER — Encounter: Payer: Self-pay | Admitting: Family Medicine

## 2018-04-07 ENCOUNTER — Ambulatory Visit (INDEPENDENT_AMBULATORY_CARE_PROVIDER_SITE_OTHER): Payer: Medicare HMO | Admitting: *Deleted

## 2018-04-07 DIAGNOSIS — I639 Cerebral infarction, unspecified: Secondary | ICD-10-CM

## 2018-04-07 MED ORDER — HYDROCODONE-ACETAMINOPHEN 10-325 MG PO TABS: 1.0000 | ORAL_TABLET | Freq: Three times a day (TID) | ORAL | 0 refills | Status: DC | PRN

## 2018-04-07 MED ORDER — TIZANIDINE HCL 4 MG PO TABS: 4.0000 mg | ORAL_TABLET | Freq: Four times a day (QID) | ORAL | 1 refills | Status: DC | PRN

## 2018-04-07 NOTE — Progress Notes (Signed)
Carelink Summary Report / Loop Recorder 

## 2018-04-07 NOTE — Telephone Encounter (Signed)
Ok to refill??  Last office visit 01/31/2018.  Last refill on Hydrocodone/APAP 02/25/2018.  Last refill on Zanaflex 1/18/201, #1 refill.

## 2018-04-08 ENCOUNTER — Encounter: Payer: Self-pay | Admitting: Family Medicine

## 2018-04-29 ENCOUNTER — Other Ambulatory Visit: Payer: Self-pay | Admitting: Family Medicine

## 2018-04-29 NOTE — Telephone Encounter (Signed)
Ok to refill??  Last office visit 01/31/2018.  Last refill 10/10/2017, #1 refill.

## 2018-04-30 ENCOUNTER — Ambulatory Visit: Payer: Medicare HMO | Admitting: Family Medicine

## 2018-05-07 ENCOUNTER — Other Ambulatory Visit: Payer: Self-pay

## 2018-05-07 ENCOUNTER — Encounter: Payer: Self-pay | Admitting: Family Medicine

## 2018-05-07 ENCOUNTER — Ambulatory Visit (INDEPENDENT_AMBULATORY_CARE_PROVIDER_SITE_OTHER): Payer: Medicare HMO | Admitting: Family Medicine

## 2018-05-07 VITALS — BP 134/72 | HR 76 | Temp 97.8°F | Resp 12 | Ht 65.0 in | Wt 131.0 lb

## 2018-05-07 DIAGNOSIS — G8929 Other chronic pain: Secondary | ICD-10-CM | POA: Diagnosis not present

## 2018-05-07 DIAGNOSIS — E782 Mixed hyperlipidemia: Secondary | ICD-10-CM | POA: Diagnosis not present

## 2018-05-07 DIAGNOSIS — F411 Generalized anxiety disorder: Secondary | ICD-10-CM

## 2018-05-07 DIAGNOSIS — F4001 Agoraphobia with panic disorder: Secondary | ICD-10-CM

## 2018-05-07 DIAGNOSIS — I10 Essential (primary) hypertension: Secondary | ICD-10-CM

## 2018-05-07 DIAGNOSIS — M4802 Spinal stenosis, cervical region: Secondary | ICD-10-CM

## 2018-05-07 DIAGNOSIS — Z113 Encounter for screening for infections with a predominantly sexual mode of transmission: Secondary | ICD-10-CM | POA: Diagnosis not present

## 2018-05-07 DIAGNOSIS — M25511 Pain in right shoulder: Secondary | ICD-10-CM

## 2018-05-07 MED ORDER — TIZANIDINE HCL 4 MG PO TABS: 4.0000 mg | ORAL_TABLET | Freq: Four times a day (QID) | ORAL | 1 refills | Status: DC | PRN

## 2018-05-07 MED ORDER — HYDROCODONE-ACETAMINOPHEN 10-325 MG PO TABS: 1.0000 | ORAL_TABLET | Freq: Three times a day (TID) | ORAL | 0 refills | Status: DC | PRN

## 2018-05-07 NOTE — Assessment & Plan Note (Signed)
Continue prn ativan
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
## 2018-05-07 NOTE — Patient Instructions (Addendum)
Referred to [facility] for shoulder pain  Referral to therapy  We will call with lab results  Call Dr. Collene Mares about colonoscopy  F/U End of Oct for Physical

## 2018-05-07 NOTE — Telephone Encounter (Signed)
Ok to refill??  Last refill 04/07/2018.

## 2018-05-07 NOTE — Assessment & Plan Note (Signed)
Well controlled, no changes to meds Fasting labs today for lipids , continue statin drug
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
## 2018-05-08 ENCOUNTER — Encounter: Payer: Self-pay | Admitting: Family Medicine

## 2018-05-08 LAB — COMPREHENSIVE METABOLIC PANEL
AG Ratio: 1.6 (calc) (ref 1.0–2.5)
ALT: 9 U/L (ref 6–29)
AST: 14 U/L (ref 10–35)
Albumin: 4.4 g/dL (ref 3.6–5.1)
Alkaline phosphatase (APISO): 85 U/L (ref 33–130)
BILIRUBIN TOTAL: 1.1 mg/dL (ref 0.2–1.2)
BUN: 17 mg/dL (ref 7–25)
CALCIUM: 9.8 mg/dL (ref 8.6–10.4)
CO2: 29 mmol/L (ref 20–32)
Chloride: 102 mmol/L (ref 98–110)
Creat: 0.8 mg/dL (ref 0.50–0.99)
GLUCOSE: 99 mg/dL (ref 65–99)
Globulin: 2.8 g/dL (calc) (ref 1.9–3.7)
Potassium: 3.6 mmol/L (ref 3.5–5.3)
SODIUM: 140 mmol/L (ref 135–146)
TOTAL PROTEIN: 7.2 g/dL (ref 6.1–8.1)

## 2018-05-08 LAB — LIPID PANEL
CHOLESTEROL: 154 mg/dL (ref <200)
HDL: 45 mg/dL — AB (ref >50)
LDL Cholesterol (Calc): 90 mg/dL (calc)
NON-HDL CHOLESTEROL (CALC): 109 mg/dL (ref <130)
TRIGLYCERIDES: 91 mg/dL (ref <150)
Total CHOL/HDL Ratio: 3.4 (calc) (ref <5.0)

## 2018-05-08 LAB — HSV(HERPES SIMPLEX VRS) I + II AB-IGG
HAV 1 IGG,TYPE SPECIFIC AB: 33.7 index — ABNORMAL HIGH
HSV 2 IGG, TYPE SPEC: 18.1 {index} — AB

## 2018-05-08 LAB — CBC WITH DIFFERENTIAL/PLATELET
BASOS ABS: 44 {cells}/uL (ref 0–200)
BASOS PCT: 0.5 %
EOS PCT: 1.8 %
Eosinophils Absolute: 157 cells/uL (ref 15–500)
HCT: 40.8 % (ref 35.0–45.0)
HEMOGLOBIN: 14 g/dL (ref 11.7–15.5)
Lymphs Abs: 3959 cells/uL — ABNORMAL HIGH (ref 850–3900)
MCH: 31.5 pg (ref 27.0–33.0)
MCHC: 34.3 g/dL (ref 32.0–36.0)
MCV: 91.7 fL (ref 80.0–100.0)
MONOS PCT: 5.6 %
MPV: 9.5 fL (ref 7.5–12.5)
NEUTROS ABS: 4054 {cells}/uL (ref 1500–7800)
Neutrophils Relative %: 46.6 %
Platelets: 296 10*3/uL (ref 140–400)
RBC: 4.45 10*6/uL (ref 3.80–5.10)
RDW: 13.5 % (ref 11.0–15.0)
Total Lymphocyte: 45.5 %
WBC mixed population: 487 cells/uL (ref 200–950)
WBC: 8.7 10*3/uL (ref 3.8–10.8)

## 2018-05-08 LAB — HIV ANTIBODY (ROUTINE TESTING W REFLEX): HIV 1&2 Ab, 4th Generation: NONREACTIVE

## 2018-05-08 LAB — RPR: RPR: NONREACTIVE

## 2018-05-09 ENCOUNTER — Other Ambulatory Visit: Payer: Self-pay | Admitting: *Deleted

## 2018-05-09 ENCOUNTER — Ambulatory Visit (INDEPENDENT_AMBULATORY_CARE_PROVIDER_SITE_OTHER): Payer: Medicare HMO | Admitting: *Deleted

## 2018-05-09 DIAGNOSIS — I639 Cerebral infarction, unspecified: Secondary | ICD-10-CM

## 2018-05-09 MED ORDER — VALACYCLOVIR HCL 500 MG PO TABS: 500.0000 mg | ORAL_TABLET | Freq: Every day | ORAL | 6 refills | Status: DC

## 2018-05-12 NOTE — Progress Notes (Signed)
Carelink Summary Report / Loop Recorder 

## 2018-05-13 LAB — CUP PACEART REMOTE DEVICE CHECK
Date Time Interrogation Session: 20190609220626
MDC IDC PG IMPLANT DT: 20161208

## 2018-05-29 ENCOUNTER — Ambulatory Visit: Payer: Medicare HMO

## 2018-06-10 ENCOUNTER — Encounter: Payer: Self-pay | Admitting: Family Medicine

## 2018-06-10 MED ORDER — HYDROCODONE-ACETAMINOPHEN 10-325 MG PO TABS: 1.0000 | ORAL_TABLET | Freq: Three times a day (TID) | ORAL | 0 refills | Status: DC | PRN

## 2018-06-10 NOTE — Telephone Encounter (Signed)
Ok to refill??  Last office visit/ refill 05/07/2018.

## 2018-06-11 ENCOUNTER — Ambulatory Visit (INDEPENDENT_AMBULATORY_CARE_PROVIDER_SITE_OTHER): Payer: Medicare HMO | Admitting: *Deleted

## 2018-06-11 DIAGNOSIS — I639 Cerebral infarction, unspecified: Secondary | ICD-10-CM | POA: Diagnosis not present

## 2018-06-12 NOTE — Progress Notes (Signed)
Carelink Summary Report / Loop Recorder 

## 2018-06-13 ENCOUNTER — Encounter: Payer: Self-pay | Admitting: Family Medicine

## 2018-06-19 LAB — CUP PACEART REMOTE DEVICE CHECK
Date Time Interrogation Session: 20190712220603
Implantable Pulse Generator Implant Date: 20161208

## 2018-07-04 ENCOUNTER — Telehealth: Payer: Self-pay | Admitting: *Deleted

## 2018-07-04 NOTE — Telephone Encounter (Signed)
Spoke with patient regarding new PAF noted on LINQ (confirmed by Dr. Rayann Heman), episode occurred on 07/02/18, duration 5hr 58min, first detected at 0039. Tachy episode also noted on 07/02/18 at 0601, duration 35sec, median V rate 188bpm, ECG suggests AF w/RVR. Patient asymptomatic with episode, states she was up until about 3:30am that morning and didn't notice anything unusual. Patient agreeable to AF Clinic appointment with Roderic Palau, NP on 07/08/18 at 3:00pm. She is aware of office address, phone number, and parking info. Patient appreciative of call and denies additional questions or concerns at this time.

## 2018-07-08 ENCOUNTER — Ambulatory Visit (HOSPITAL_COMMUNITY)
Admission: RE | Admit: 2018-07-08 | Discharge: 2018-07-08 | Disposition: A | Discharge status: Home / Self Care | Payer: Medicare HMO | Source: Ambulatory Visit | Attending: Nurse Practitioner | Admitting: Nurse Practitioner

## 2018-07-08 ENCOUNTER — Ambulatory Visit
Admission: RE | Admit: 2018-07-08 | Discharge: 2018-07-08 | Disposition: A | Discharge status: Home / Self Care | Payer: Medicare HMO | Source: Ambulatory Visit | Attending: Family Medicine | Admitting: Family Medicine

## 2018-07-08 ENCOUNTER — Encounter (HOSPITAL_COMMUNITY): Payer: Self-pay | Admitting: Nurse Practitioner

## 2018-07-08 VITALS — BP 130/92 | HR 82 | Ht 65.0 in | Wt 138.0 lb

## 2018-07-08 DIAGNOSIS — M1990 Unspecified osteoarthritis, unspecified site: Secondary | ICD-10-CM | POA: Diagnosis not present

## 2018-07-08 DIAGNOSIS — Z7982 Long term (current) use of aspirin: Secondary | ICD-10-CM | POA: Diagnosis not present

## 2018-07-08 DIAGNOSIS — Z791 Long term (current) use of non-steroidal anti-inflammatories (NSAID): Secondary | ICD-10-CM | POA: Insufficient documentation

## 2018-07-08 DIAGNOSIS — Z90710 Acquired absence of both cervix and uterus: Secondary | ICD-10-CM | POA: Insufficient documentation

## 2018-07-08 DIAGNOSIS — Z881 Allergy status to other antibiotic agents status: Secondary | ICD-10-CM | POA: Diagnosis not present

## 2018-07-08 DIAGNOSIS — F419 Anxiety disorder, unspecified: Secondary | ICD-10-CM | POA: Diagnosis not present

## 2018-07-08 DIAGNOSIS — Z79899 Other long term (current) drug therapy: Secondary | ICD-10-CM | POA: Diagnosis not present

## 2018-07-08 DIAGNOSIS — M797 Fibromyalgia: Secondary | ICD-10-CM | POA: Diagnosis not present

## 2018-07-08 DIAGNOSIS — Z8673 Personal history of transient ischemic attack (TIA), and cerebral infarction without residual deficits: Secondary | ICD-10-CM | POA: Diagnosis not present

## 2018-07-08 DIAGNOSIS — Z880 Allergy status to penicillin: Secondary | ICD-10-CM | POA: Diagnosis not present

## 2018-07-08 DIAGNOSIS — Z981 Arthrodesis status: Secondary | ICD-10-CM | POA: Insufficient documentation

## 2018-07-08 DIAGNOSIS — Z86010 Personal history of colonic polyps: Secondary | ICD-10-CM | POA: Insufficient documentation

## 2018-07-08 DIAGNOSIS — Z833 Family history of diabetes mellitus: Secondary | ICD-10-CM | POA: Insufficient documentation

## 2018-07-08 DIAGNOSIS — I10 Essential (primary) hypertension: Secondary | ICD-10-CM | POA: Insufficient documentation

## 2018-07-08 DIAGNOSIS — I480 Paroxysmal atrial fibrillation: Secondary | ICD-10-CM | POA: Diagnosis not present

## 2018-07-08 DIAGNOSIS — Z1239 Encounter for other screening for malignant neoplasm of breast: Secondary | ICD-10-CM

## 2018-07-08 DIAGNOSIS — Z1231 Encounter for screening mammogram for malignant neoplasm of breast: Secondary | ICD-10-CM | POA: Diagnosis not present

## 2018-07-08 DIAGNOSIS — E785 Hyperlipidemia, unspecified: Secondary | ICD-10-CM | POA: Diagnosis not present

## 2018-07-08 DIAGNOSIS — F17210 Nicotine dependence, cigarettes, uncomplicated: Secondary | ICD-10-CM | POA: Diagnosis not present

## 2018-07-08 DIAGNOSIS — I4891 Unspecified atrial fibrillation: Secondary | ICD-10-CM | POA: Diagnosis present

## 2018-07-08 DIAGNOSIS — Z8249 Family history of ischemic heart disease and other diseases of the circulatory system: Secondary | ICD-10-CM | POA: Diagnosis not present

## 2018-07-08 DIAGNOSIS — Z9049 Acquired absence of other specified parts of digestive tract: Secondary | ICD-10-CM | POA: Insufficient documentation

## 2018-07-08 MED ORDER — RIVAROXABAN 20 MG PO TABS: 20.0000 mg | ORAL_TABLET | Freq: Every day | ORAL | 0 refills | Status: DC

## 2018-07-08 MED ORDER — RIVAROXABAN 20 MG PO TABS: 20.0000 mg | ORAL_TABLET | Freq: Every day | ORAL | 6 refills | Status: DC

## 2018-07-08 NOTE — Patient Instructions (Signed)
Start Xarelto 20mg  once a day with supper  Stop aspirin
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
## 2018-07-09 ENCOUNTER — Encounter: Payer: Self-pay | Admitting: *Deleted

## 2018-07-14 ENCOUNTER — Other Ambulatory Visit: Payer: Self-pay | Admitting: *Deleted

## 2018-07-14 ENCOUNTER — Ambulatory Visit (INDEPENDENT_AMBULATORY_CARE_PROVIDER_SITE_OTHER): Payer: Medicare HMO | Admitting: *Deleted

## 2018-07-14 ENCOUNTER — Other Ambulatory Visit: Payer: Self-pay | Admitting: Internal Medicine

## 2018-07-14 DIAGNOSIS — I639 Cerebral infarction, unspecified: Secondary | ICD-10-CM | POA: Diagnosis not present

## 2018-07-14 MED ORDER — HYDROCODONE-ACETAMINOPHEN 10-325 MG PO TABS: 1.0000 | ORAL_TABLET | Freq: Three times a day (TID) | ORAL | 0 refills | Status: DC | PRN

## 2018-07-14 NOTE — Telephone Encounter (Signed)
Received call from patient.   Requested refill on Hydrocodone/APAP.   Ok to refill??  Last office visit 05/07/2018.  Last refill 06/10/2018.

## 2018-07-15 NOTE — Progress Notes (Signed)
Carelink Summary Report / Loop Recorder 

## 2018-07-16 ENCOUNTER — Telehealth: Payer: Self-pay | Admitting: Family Medicine

## 2018-07-16 ENCOUNTER — Other Ambulatory Visit: Payer: Self-pay | Admitting: Family Medicine

## 2018-07-16 DIAGNOSIS — I10 Essential (primary) hypertension: Secondary | ICD-10-CM

## 2018-07-16 MED ORDER — HYDROCHLOROTHIAZIDE 12.5 MG PO CAPS: 12.5000 mg | ORAL_CAPSULE | Freq: Every day | ORAL | 3 refills | Status: DC

## 2018-07-16 NOTE — Telephone Encounter (Signed)
Pt needs refill on hydrochlorothiazide to [place] [facility].

## 2018-07-17 LAB — CUP PACEART REMOTE DEVICE CHECK
Date Time Interrogation Session: 20190814221033
MDC IDC PG IMPLANT DT: 20161208

## 2018-07-21 ENCOUNTER — Telehealth: Payer: Self-pay | Admitting: *Deleted

## 2018-07-21 NOTE — Telephone Encounter (Signed)
Received call from patient.   Reports that she has new insurance and is unable to afford Valtrex at this time. States that Acyclovir is preferred. Patient is taking daily Valtrex for prophylaxis.   Please advise.

## 2018-07-22 NOTE — Telephone Encounter (Signed)
Acyclovir 400 mg bid.

## 2018-07-23 MED ORDER — ACYCLOVIR 400 MG PO TABS: 400.0000 mg | ORAL_TABLET | Freq: Two times a day (BID) | ORAL | 3 refills | Status: DC

## 2018-07-23 NOTE — Telephone Encounter (Signed)
Patient aware per MyChart.   Prescription sent to pharmacy.  

## 2018-07-27 LAB — CUP PACEART REMOTE DEVICE CHECK
Implantable Pulse Generator Implant Date: 20161208
MDC IDC SESS DTM: 20190917004005

## 2018-08-07 ENCOUNTER — Encounter (HOSPITAL_COMMUNITY): Payer: Self-pay | Admitting: Nurse Practitioner

## 2018-08-07 ENCOUNTER — Ambulatory Visit (HOSPITAL_COMMUNITY)
Admission: RE | Admit: 2018-08-07 | Discharge: 2018-08-07 | Disposition: A | Discharge status: Home / Self Care | Payer: Medicare HMO | Source: Ambulatory Visit | Attending: Nurse Practitioner | Admitting: Nurse Practitioner

## 2018-08-07 VITALS — BP 124/72 | HR 75 | Ht 65.0 in | Wt 143.0 lb

## 2018-08-07 DIAGNOSIS — G43909 Migraine, unspecified, not intractable, without status migrainosus: Secondary | ICD-10-CM | POA: Insufficient documentation

## 2018-08-07 DIAGNOSIS — I10 Essential (primary) hypertension: Secondary | ICD-10-CM | POA: Diagnosis not present

## 2018-08-07 DIAGNOSIS — Z8673 Personal history of transient ischemic attack (TIA), and cerebral infarction without residual deficits: Secondary | ICD-10-CM | POA: Diagnosis not present

## 2018-08-07 DIAGNOSIS — F419 Anxiety disorder, unspecified: Secondary | ICD-10-CM | POA: Insufficient documentation

## 2018-08-07 DIAGNOSIS — Z79899 Other long term (current) drug therapy: Secondary | ICD-10-CM | POA: Diagnosis not present

## 2018-08-07 DIAGNOSIS — Z7901 Long term (current) use of anticoagulants: Secondary | ICD-10-CM | POA: Diagnosis not present

## 2018-08-07 DIAGNOSIS — Z8249 Family history of ischemic heart disease and other diseases of the circulatory system: Secondary | ICD-10-CM | POA: Insufficient documentation

## 2018-08-07 DIAGNOSIS — G709 Myoneural disorder, unspecified: Secondary | ICD-10-CM | POA: Insufficient documentation

## 2018-08-07 DIAGNOSIS — I480 Paroxysmal atrial fibrillation: Secondary | ICD-10-CM | POA: Insufficient documentation

## 2018-08-07 DIAGNOSIS — E785 Hyperlipidemia, unspecified: Secondary | ICD-10-CM | POA: Insufficient documentation

## 2018-08-07 DIAGNOSIS — M797 Fibromyalgia: Secondary | ICD-10-CM | POA: Insufficient documentation

## 2018-08-07 DIAGNOSIS — M1990 Unspecified osteoarthritis, unspecified site: Secondary | ICD-10-CM | POA: Insufficient documentation

## 2018-08-07 DIAGNOSIS — F17210 Nicotine dependence, cigarettes, uncomplicated: Secondary | ICD-10-CM | POA: Insufficient documentation

## 2018-08-07 LAB — CBC
HCT: 37.9 % (ref 36.0–46.0)
Hemoglobin: 12.4 g/dL (ref 12.0–15.0)
MCH: 30.7 pg (ref 26.0–34.0)
MCHC: 32.7 g/dL (ref 30.0–36.0)
MCV: 93.8 fL (ref 80.0–100.0)
PLATELETS: 348 10*3/uL (ref 150–400)
RBC: 4.04 MIL/uL (ref 3.87–5.11)
RDW: 13.5 % (ref 11.5–15.5)
WBC: 8 10*3/uL (ref 4.0–10.5)
nRBC: 0 % (ref 0.0–0.2)

## 2018-08-07 NOTE — Progress Notes (Signed)
Primary Care Physician: Alycia Rossetti, MD Referring Physician: Dr. Danielle Dess is a 62 y.o. female with a h/o prior stroke, Linq and remote mediastinal mass resection/sternotomy that is in the afib clinic for recent afib seen on LInq as confirmed by Dr. Rayann Heman. She has been unaware of any arrhythmia. Her chadsvasc  score is 4. She is currently on an ASA.  Returned to afib clinic after starting xarelto 20 mg daily for afib seen on Linq. Noticed some mild fatigue since starting drug but no bleeding. Has not noticed any irregular heart beat.  Today, she denies symptoms of palpitations, chest pain, shortness of breath, orthopnea, PND, lower extremity edema, dizziness, presyncope, syncope, or neurologic sequela. The patient is tolerating medications without difficulties and is otherwise without complaint today.   Past Medical History:  Diagnosis Date  . Anxiety    takes Ativan daily as needed  . Arthritis   . Fibromyalgia   . History of bronchitis    few yrs ago  . History of colon polyps    benign  . History of migraine    last one over a month ago  . Hyperlipidemia    takes Atorvastatin daily  . Hypertension    takes Clonidine and HCTZ daily  . Joint pain   . Migraines   . Neuromuscular disorder ([place])   . Nocturia   . Stroke [facility]) 35/4656   Embolic Right ACA,  ILR placed Dr. Rayann Heman.States slight weakness in left   Past Surgical History:  Procedure Laterality Date  . ABDOMINAL HYSTERECTOMY  1997  . ANTERIOR CERVICAL DECOMP/DISCECTOMY FUSION N/A 10/25/2014   Procedure: CERVICAL THREE-FOUR, CERVICAL FOUR FIVE ANTERIOR CERVICAL DECOMPRESSION/DISCECTOMY FUSION 2 LEVEL/HARDWARE REMOVALCERVICAL FIVE-SEVEN;  Surgeon: Elaina Hoops, MD;  Location: [place] NEURO ORS;  Service: Neurosurgery;  Laterality: N/A;  . BREAST DUCTAL SYSTEM EXCISION Left 11/14/2016   Procedure: EXCISION DUCTAL SYSTEM LEFT BREAST;  Surgeon: Autumn Messing III, MD;  Location: [place];  Service: General;   Laterality: Left;  . BREAST EXCISIONAL BIOPSY Right    benign  . BREAST EXCISIONAL BIOPSY Left    benign  . CARPAL TUNNEL RELEASE Left 10/25/2014   Procedure: CARPAL TUNNEL RELEASE;  Surgeon: Elaina Hoops, MD;  Location: [place] NEURO ORS;  Service: Neurosurgery;  Laterality: Left;  . CARPAL TUNNEL RELEASE Right   . CERVICAL FUSION  2007  . CESAREAN SECTION  1985  . CHEST SURGERY  04/2015   Benign chest mass removed- [place]  . COLONOSCOPY    . EP IMPLANTABLE DEVICE N/A 10/06/2015   Procedure: Loop Recorder Insertion;  Surgeon: Thompson Grayer, MD;  Location: [place] CV LAB;  Service: Cardiovascular;  Laterality: N/A;  . KNEE ARTHROSCOPY Right 1999  . KNEE ARTHROSCOPY WITH MEDIAL MENISECTOMY Right 10/09/2013   Procedure: RIGHT KNEE ARTHROSCOPY WITH MEDIAL MENISECTOMY;  Surgeon: Johnny Bridge, MD;  Location: [place];  Service: Orthopedics;  Laterality: Right;  clean  . TEE WITHOUT CARDIOVERSION N/A 10/05/2015   Procedure: TRANSESOPHAGEAL ECHOCARDIOGRAM (TEE);  Surgeon: Thayer Headings, MD;  Location[facility];  Service: Cardiovascular;  Laterality: N/A;  . thumb surgery Right     Current Outpatient Medications  Medication Sig Dispense Refill  . acetaminophen (TYLENOL) 500 MG tablet Take 500-1,000 mg by mouth every 6 (six) hours as needed for headache (depends on pain).    Marland Kitchen acyclovir (ZOVIRAX) 400 MG tablet Take 1 tablet (400 mg total) by  mouth 2 (two) times daily. 60 tablet 3  . atorvastatin (LIPITOR) 40 MG tablet Take 40 mg by mouth daily.  2  . cloNIDine (CATAPRES) 0.1 MG tablet TAKE 1 TABLET BY MOUTH TWICE DAILY 180 tablet 1  . clotrimazole (LOTRIMIN) 1 % cream Apply 1 application topically 2 (two) times daily. To face 30 g 1  . hydrochlorothiazide (MICROZIDE) 12.5 MG capsule Take 1 capsule (12.5 mg total) by mouth daily. 90 capsule 3  . HYDROcodone-acetaminophen (NORCO) 10-325 MG tablet Take 1 tablet by mouth every 8 (eight) hours as  needed. 30 tablet 0  . LORazepam (ATIVAN) 0.5 MG tablet TAKE 1 TABLET BY MOUTH TWICE DAILY AS NEEDED FOR ANXIETY 30 tablet 1  . rivaroxaban (XARELTO) 20 MG TABS tablet Take 1 tablet (20 mg total) by mouth daily with supper. 30 tablet 6  . tiZANidine (ZANAFLEX) 4 MG tablet Take 1 tablet (4 mg total) by mouth every 6 (six) hours as needed for muscle spasms. 30 tablet 1   No current facility-administered medications for this encounter.     Allergies  Allergen Reactions  . Azithromycin Nausea And Vomiting  . Floxin [Ofloxacin] Nausea And Vomiting    Makes sick to stomach and vomit  . Penicillins Rash    Has patient had a PCN reaction causing immediate rash, facial/tongue/throat swelling, SOB or lightheadedness with hypotension: No Has patient had a PCN reaction causing severe rash involving mucus membranes or skin necrosis: No Has patient had a PCN reaction that required hospitalization No Has patient had a PCN reaction occurring within the last 10 years: No If all of the above answers are "NO", then may proceed with Cephalosporin use.     Social History   Socioeconomic History  . Marital status: Single    Spouse name: Not on file  . Number of children: Not on file  . Years of education: Not on file  . Highest education level: Not on file  Occupational History  . Not on file  Social Needs  . Financial resource strain: Not on file  . Food insecurity:    Worry: Not on file    Inability: Not on file  . Transportation needs:    Medical: Not on file    Non-medical: Not on file  Tobacco Use  . Smoking status: Light Tobacco Smoker    Types: Cigarettes    Last attempt to quit: 07/07/2014    Years since quitting: 4.0  . Smokeless tobacco: Never Used  . Tobacco comment: may smoke 2 cigarettes per month  Substance and Sexual Activity  . Alcohol use: No  . Drug use: No  . Sexual activity: Yes    Birth control/protection: Surgical  Lifestyle  . Physical activity:    Days per week:  Not on file    Minutes per session: Not on file  . Stress: Not on file  Relationships  . Social connections:    Talks on phone: Not on file    Gets together: Not on file    Attends religious service: Not on file    Active member of club or organization: Not on file    Attends meetings of clubs or organizations: Not on file    Relationship status: Not on file  . Intimate partner violence:    Fear of current or ex partner: Not on file    Emotionally abused: Not on file    Physically abused: Not on file    Forced sexual activity: Not on file  Other  Topics Concern  . Not on file  Social History Narrative  . Not on file    Family History  Problem Relation Age of Onset  . Hypertension Mother   . Diabetes Mother   . Cancer Maternal Grandmother        colon  . Stroke Maternal Grandmother     ROS- All systems are reviewed and negative except as per the HPI above  Physical Exam: Vitals:   08/07/18 0921  BP: 124/72  Pulse: 75  Weight: 64.9 kg  Height: 5\' 5"  (1.651 m)   Wt Readings from Last 3 Encounters:  08/07/18 64.9 kg  07/08/18 62.6 kg  05/07/18 59.4 kg    Labs: Lab Results  Component Value Date   NA 140 05/07/2018   K 3.6 05/07/2018   CL 102 05/07/2018   CO2 29 05/07/2018   GLUCOSE 99 05/07/2018   BUN 17 05/07/2018   CREATININE 0.80 05/07/2018   CALCIUM 9.8 05/07/2018   Lab Results  Component Value Date   INR 0.97 10/02/2015   Lab Results  Component Value Date   CHOL 154 05/07/2018   HDL 45 (L) 05/07/2018   LDLCALC 90 05/07/2018   TRIG 91 05/07/2018     GEN- The patient is well appearing, alert and oriented x 3 today.   Head- normocephalic, atraumatic Eyes-  Sclera clear, conjunctiva pink Ears- hearing intact Oropharynx- clear Neck- supple, no JVP Lymph- no cervical lymphadenopathy Lungs- Clear to ausculation bilaterally, normal work of breathing Heart- Regular rate and rhythm, no murmurs, rubs or gallops, PMI not laterally displaced GI-  soft, NT, ND, + BS Extremities- no clubbing, cyanosis, or edema MS- no significant deformity or atrophy Skin- no rash or lesion Psych- euthymic mood, full affect Neuro- strength and sensation are intact  EKG- NSR at 75 bpm, pr int 148 ms, qrs int 78 ms, qtc 448 ms Epic records reviewed , device phone call to pt documenting afib reviewed    Assessment and Plan: 1. Paroxysmal afib  Pt is unaware  2. Chadsvasc score of 4 No bleeding history Will stop ASA and start xarelto 20 mg daily with crcl cal at 72.05 ml/min Bleeding precautions discussed  Cbc drawn  F/u  Per device clinic/PCP afib clinic as needed  Butch Penny C. Lonya Johannesen, [facility] [address] [place], [place] [address] [phone]

## 2018-08-18 ENCOUNTER — Other Ambulatory Visit: Payer: Self-pay | Admitting: Family Medicine

## 2018-08-18 ENCOUNTER — Other Ambulatory Visit: Payer: Self-pay | Admitting: *Deleted

## 2018-08-18 ENCOUNTER — Ambulatory Visit (INDEPENDENT_AMBULATORY_CARE_PROVIDER_SITE_OTHER): Payer: Medicare HMO | Admitting: *Deleted

## 2018-08-18 DIAGNOSIS — I639 Cerebral infarction, unspecified: Secondary | ICD-10-CM | POA: Diagnosis not present

## 2018-08-18 MED ORDER — HYDROCODONE-ACETAMINOPHEN 10-325 MG PO TABS: 1.0000 | ORAL_TABLET | Freq: Three times a day (TID) | ORAL | 0 refills | Status: DC | PRN

## 2018-08-18 NOTE — Telephone Encounter (Signed)
Received call from patient.   Requested refill on Hydrocodone/ APAP.   Ok to refill??  Last office visit 05/07/2018.  Last refill 07/14/2018.

## 2018-08-18 NOTE — Progress Notes (Signed)
Carelink Summary Report / Loop Recorder 

## 2018-08-19 ENCOUNTER — Other Ambulatory Visit: Payer: Self-pay | Admitting: Family Medicine

## 2018-08-20 ENCOUNTER — Encounter: Payer: Medicare HMO | Admitting: Physician Assistant

## 2018-09-06 LAB — CUP PACEART REMOTE DEVICE CHECK
Implantable Pulse Generator Implant Date: 20161208
MDC IDC SESS DTM: 20191020013939

## 2018-09-09 ENCOUNTER — Telehealth (HOSPITAL_COMMUNITY): Payer: Self-pay | Admitting: *Deleted

## 2018-09-09 NOTE — Telephone Encounter (Signed)
Patient due for 2 extractions with her dentist. Per Roderic Palau NP patient should continue Xarelto without interruption due to history of prior stroke and CHADVASC score of 4.

## 2018-09-17 ENCOUNTER — Encounter: Payer: Self-pay | Admitting: Family Medicine

## 2018-09-17 ENCOUNTER — Other Ambulatory Visit: Payer: Self-pay

## 2018-09-17 ENCOUNTER — Ambulatory Visit (INDEPENDENT_AMBULATORY_CARE_PROVIDER_SITE_OTHER): Payer: Medicare HMO | Admitting: Family Medicine

## 2018-09-17 VITALS — BP 128/86 | HR 88 | Temp 98.5°F | Resp 14 | Ht 65.0 in | Wt 146.0 lb

## 2018-09-17 DIAGNOSIS — I480 Paroxysmal atrial fibrillation: Secondary | ICD-10-CM | POA: Insufficient documentation

## 2018-09-17 DIAGNOSIS — F411 Generalized anxiety disorder: Secondary | ICD-10-CM

## 2018-09-17 DIAGNOSIS — Z0000 Encounter for general adult medical examination without abnormal findings: Secondary | ICD-10-CM

## 2018-09-17 DIAGNOSIS — M5136 Other intervertebral disc degeneration, lumbar region: Secondary | ICD-10-CM

## 2018-09-17 DIAGNOSIS — I10 Essential (primary) hypertension: Secondary | ICD-10-CM

## 2018-09-17 DIAGNOSIS — E782 Mixed hyperlipidemia: Secondary | ICD-10-CM

## 2018-09-17 HISTORY — DX: Paroxysmal atrial fibrillation: I48.0

## 2018-09-17 MED ORDER — HYDROCODONE-ACETAMINOPHEN 10-325 MG PO TABS: 1.0000 | ORAL_TABLET | Freq: Three times a day (TID) | ORAL | 0 refills | Status: DC | PRN

## 2018-09-17 MED ORDER — ZOSTER VAC RECOMB ADJUVANTED 50 MCG/0.5ML IM SUSR: 0.5000 mL | Freq: Once | INTRAMUSCULAR | 1 refills | Status: AC

## 2018-09-17 MED ORDER — LORAZEPAM 0.5 MG PO TABS: 0.5000 mg | ORAL_TABLET | Freq: Two times a day (BID) | ORAL | 1 refills | Status: AC | PRN

## 2018-09-17 NOTE — Progress Notes (Signed)
Subjective:   Patient presents for Medicare Annual/Subsequent preventive examination.   Since our last visit her loop recorder- picked up A Fib Sept and she is now on Xarelto. History of cryptogenic stroke 3 years ago. She has not had any excessive bleeding.  Continues to have knee problems, chronic neck ain, she does her home exercises  Has chronic muscle relaxer and norco,request refill   GAD-using ativan as needed    Lives with sister who helps  Note she has been trying to gain weight, up 15lbs since July, feels good at this weight    Review Past Medical/Family/Social: Per EMR   Risk Factors  Current exercise habits: walks Dietary issues discussed: none  Cardiac risk factors: stroke, A fib   Depression Screen  (Note: if answer to either of the following is "Yes", a more complete depression screening is indicated)  Over the past two weeks, have you felt down, depressed or hopeless? No Over the past two weeks, have you felt little interest or pleasure in doing things? No Have you lost interest or pleasure in daily life? No Do you often feel hopeless? No Do you cry easily over simple problems? No   Activities of Daily Living  In your present state of health, do you have any difficulty performing the following activities?:  Driving? No  Managing money? No  Feeding yourself? No  Getting from bed to chair? No  Climbing a flight of stairs? Yes  Preparing food and eating?: No  Bathing or showering? No  Getting dressed: No  Getting to the toilet? No  Using the toilet:No  Moving around from place to place: No  In the past year have you fallen or had a near fall?:yes  Are you sexually active? No  Do you have more than one partner? No   Hearing Difficulties: No  Do you often ask people to speak up or repeat themselves? No  Do you experience ringing or noises in your ears? No Do you have difficulty understanding soft or whispered voices? No  Do you feel that you have a  problem with memory? No Do you often misplace items? No  Do you feel safe at home? Yes  Cognitive Testing  Alert? Yes Normal Appearance?Yes  Oriented to person? Yes Place? Yes  Time? Yes  Recall of three objects? Yes  Can perform simple calculations? Yes  Displays appropriate judgment?Yes  Can read the correct time from a watch face?Yes   List the Names of Other Physician/Practitioners you currently use:   Neurology - Dr. Erlinda Hong  Cardiology  GI- Dr. Collene Mares    Screening Tests / Date Colonoscopy    - Due                  Zostavax - Due Mammogram UTD Influenza Vaccine DECLINES  Tetanus/tdap UTD HIV/HEP C- NEGATIVE   ROS: GEN- denies fatigue, fever, weight loss,weakness, recent illness HEENT- denies eye drainage, change in vision, nasal discharge, CVS- denies chest pain, palpitations RESP- denies SOB, cough, wheeze ABD- denies N/V, change in stools, abd pain GU- denies dysuria, hematuria, dribbling, incontinence MSK- denies joint pain, muscle aches, injury Neuro- denies headache, dizziness, syncope, seizure activity  Physical: GEN- NAD, alert and oriented x3 HEENT- PERRL, EOMI, non injected sclera, pink conjunctiva, MMM, oropharynx clear Neck- Supple, no thryomegaly CVS- RRR, no murmur RESP-CTAB ABD-NABS,soft,NT,ND Psych- normal affect and mood EXT- No edema Pulses- Radial, DP- 2+    Assessment:    Annual wellness medicare exam   Plan:  During the course of the visit the patient was educated and counseled about appropriate screening and preventive services including:  FRC/FALL/DEPRESSION SCREEN DONE - no changes or equipmenet needed at home  GAD- xanax refilled  HTN/PAF- controlled, with A fib on blood thinner, given forms to complete for patient assistance for medication  DDD/Chronic pain- medications refilled  Prevention- pt to call Dr. Collene Mares and schedule colonoscopy  - Shingrix sent to pharmacy  Given handout on living will/advanced directives   Diet  review for nutrition referral? Yes ____ Not Indicated __x__  Patient Instructions (the written plan) was given to the patient.  Medicare Attestation  I have personally reviewed:  The patient's medical and social history  Their use of alcohol, tobacco or illicit drugs  Their current medications and supplements  The patient's functional ability including ADLs,fall risks, home safety risks, cognitive, and hearing and visual impairment  Diet and physical activities  Evidence for depression or mood disorders  The patient's weight, height, BMI, and visual acuity have been recorded in the chart. I have made referrals, counseling, and provided education to the patient based on review of the above and I have provided the patient with a written personalized care plan for preventive services.

## 2018-09-17 NOTE — Patient Instructions (Addendum)
Shingles vaccine sent to pharmacy  Schedule your colonoscopy Pain medications refilled  F/U 4 months

## 2018-09-18 ENCOUNTER — Encounter: Payer: Self-pay | Admitting: Family Medicine

## 2018-09-18 ENCOUNTER — Ambulatory Visit (INDEPENDENT_AMBULATORY_CARE_PROVIDER_SITE_OTHER): Payer: Medicare HMO

## 2018-09-18 DIAGNOSIS — I639 Cerebral infarction, unspecified: Secondary | ICD-10-CM | POA: Diagnosis not present

## 2018-09-18 LAB — BASIC METABOLIC PANEL
BUN: 11 mg/dL (ref 7–25)
CHLORIDE: 102 mmol/L (ref 98–110)
CO2: 28 mmol/L (ref 20–32)
Calcium: 9.4 mg/dL (ref 8.6–10.4)
Creat: 0.88 mg/dL (ref 0.50–0.99)
Glucose, Bld: 80 mg/dL (ref 65–99)
POTASSIUM: 3.6 mmol/L (ref 3.5–5.3)
SODIUM: 139 mmol/L (ref 135–146)

## 2018-09-18 LAB — TSH: TSH: 1.16 m[IU]/L (ref 0.40–4.50)

## 2018-09-19 NOTE — Progress Notes (Signed)
Carelink Summary Report / Loop Recorder 

## 2018-10-08 ENCOUNTER — Encounter: Payer: Self-pay | Admitting: *Deleted

## 2018-10-19 ENCOUNTER — Encounter: Payer: Self-pay | Admitting: Family Medicine

## 2018-10-20 MED ORDER — HYDROCODONE-ACETAMINOPHEN 10-325 MG PO TABS: 1.0000 | ORAL_TABLET | Freq: Three times a day (TID) | ORAL | 0 refills | Status: DC | PRN

## 2018-10-20 NOTE — Telephone Encounter (Signed)
Ok to refill??  Last office visit/ refill 09/17/2018.

## 2018-10-21 ENCOUNTER — Ambulatory Visit (INDEPENDENT_AMBULATORY_CARE_PROVIDER_SITE_OTHER): Payer: Medicare HMO

## 2018-10-21 DIAGNOSIS — I639 Cerebral infarction, unspecified: Secondary | ICD-10-CM | POA: Diagnosis not present

## 2018-10-22 LAB — CUP PACEART REMOTE DEVICE CHECK
Implantable Pulse Generator Implant Date: 20161208
MDC IDC SESS DTM: 20191225031016

## 2018-10-23 NOTE — Progress Notes (Signed)
Carelink Summary Report / Loop Recorder 

## 2018-11-02 ENCOUNTER — Encounter: Payer: Self-pay | Admitting: Family Medicine

## 2018-11-02 LAB — CUP PACEART REMOTE DEVICE CHECK
Date Time Interrogation Session: 20191122023947
Implantable Pulse Generator Implant Date: 20161208

## 2018-11-03 ENCOUNTER — Emergency Department (HOSPITAL_COMMUNITY)
Admission: EM | Admit: 2018-11-03 | Discharge: 2018-11-03 | Disposition: A | Discharge status: Home / Self Care | Payer: Medicare HMO | Attending: Emergency Medicine | Admitting: Emergency Medicine

## 2018-11-03 ENCOUNTER — Encounter (HOSPITAL_COMMUNITY): Payer: Self-pay | Admitting: *Deleted

## 2018-11-03 ENCOUNTER — Emergency Department (HOSPITAL_COMMUNITY): Payer: Medicare HMO

## 2018-11-03 DIAGNOSIS — M25561 Pain in right knee: Secondary | ICD-10-CM | POA: Diagnosis not present

## 2018-11-03 DIAGNOSIS — F17210 Nicotine dependence, cigarettes, uncomplicated: Secondary | ICD-10-CM | POA: Diagnosis not present

## 2018-11-03 DIAGNOSIS — W1830XA Fall on same level, unspecified, initial encounter: Secondary | ICD-10-CM | POA: Insufficient documentation

## 2018-11-03 DIAGNOSIS — Y999 Unspecified external cause status: Secondary | ICD-10-CM | POA: Insufficient documentation

## 2018-11-03 DIAGNOSIS — S8991XA Unspecified injury of right lower leg, initial encounter: Secondary | ICD-10-CM | POA: Diagnosis not present

## 2018-11-03 DIAGNOSIS — Y939 Activity, unspecified: Secondary | ICD-10-CM | POA: Diagnosis not present

## 2018-11-03 DIAGNOSIS — Y929 Unspecified place or not applicable: Secondary | ICD-10-CM | POA: Insufficient documentation

## 2018-11-03 DIAGNOSIS — W19XXXA Unspecified fall, initial encounter: Secondary | ICD-10-CM

## 2018-11-03 MED ORDER — HYDROCODONE-ACETAMINOPHEN 5-325 MG PO TABS: 1.0000 | ORAL_TABLET | ORAL | 0 refills | Status: DC | PRN

## 2018-11-03 NOTE — ED Provider Notes (Signed)
[facility] Emergency Department Provider Note MRN:  [id number]  Arrival date & time: 11/03/18     Chief Complaint   Fall   History of Present Illness   Alicia Bolton is a 63 y.o. year-old female with a history of stroke presenting to the ED with chief complaint of fall.  Patient was walking in a dark room, does not know what she tripped down but tripped on something and fell onto her right knee.  No head trauma, no loss of consciousness, no chest pain or shortness of breath, no abdominal pain, no hip pain.  Isolated right knee pain.  Pain is moderate, constant, worse with motion or palpation.  Review of Systems  A complete 10 system review of systems was obtained and all systems are negative except as noted in the HPI and PMH.   Patient's Health History    Past Medical History:  Diagnosis Date  . Anxiety    takes Ativan daily as needed  . Arthritis   . Fibromyalgia   . History of bronchitis    few yrs ago  . History of colon polyps    benign  . History of migraine    last one over a month ago  . Hyperlipidemia    takes Atorvastatin daily  . Hypertension    takes Clonidine and HCTZ daily  . Joint pain   . Migraines   . Neuromuscular disorder ([place])   . Nocturia   . Stroke [facility]) 07/1750   Embolic Right ACA,  ILR placed Dr. Rayann Heman.States slight weakness in left    Past Surgical History:  Procedure Laterality Date  . ABDOMINAL HYSTERECTOMY  1997  . ANTERIOR CERVICAL DECOMP/DISCECTOMY FUSION N/A 10/25/2014   Procedure: CERVICAL THREE-FOUR, CERVICAL FOUR FIVE ANTERIOR CERVICAL DECOMPRESSION/DISCECTOMY FUSION 2 LEVEL/HARDWARE REMOVALCERVICAL FIVE-SEVEN;  Surgeon: Elaina Hoops, MD;  Location: [place] NEURO ORS;  Service: Neurosurgery;  Laterality: N/A;  . BREAST DUCTAL SYSTEM EXCISION Left 11/14/2016   Procedure: EXCISION DUCTAL SYSTEM LEFT BREAST;  Surgeon: Autumn Messing III, MD;  Location: [place];  Service: General;  Laterality: Left;  . BREAST EXCISIONAL BIOPSY  Right    benign  . BREAST EXCISIONAL BIOPSY Left    benign  . CARPAL TUNNEL RELEASE Left 10/25/2014   Procedure: CARPAL TUNNEL RELEASE;  Surgeon: Elaina Hoops, MD;  Location: [place] NEURO ORS;  Service: Neurosurgery;  Laterality: Left;  . CARPAL TUNNEL RELEASE Right   . CERVICAL FUSION  2007  . CESAREAN SECTION  1985  . CHEST SURGERY  04/2015   Benign chest mass removed- [place]  . COLONOSCOPY    . EP IMPLANTABLE DEVICE N/A 10/06/2015   Procedure: Loop Recorder Insertion;  Surgeon: Thompson Grayer, MD;  Location: [place] CV LAB;  Service: Cardiovascular;  Laterality: N/A;  . KNEE ARTHROSCOPY Right 1999  . KNEE ARTHROSCOPY WITH MEDIAL MENISECTOMY Right 10/09/2013   Procedure: RIGHT KNEE ARTHROSCOPY WITH MEDIAL MENISECTOMY;  Surgeon: Johnny Bridge, MD;  Location: [place];  Service: Orthopedics;  Laterality: Right;  clean  . TEE WITHOUT CARDIOVERSION N/A 10/05/2015   Procedure: TRANSESOPHAGEAL ECHOCARDIOGRAM (TEE);  Surgeon: Thayer Headings, MD;  Location: [facility] ENDOSCOPY;  Service: Cardiovascular;  Laterality: N/A;  . thumb surgery Right     Family History  Problem Relation Age of Onset  . Hypertension Mother   . Diabetes Mother   . Cancer Maternal Grandmother        colon  .  Stroke Maternal Grandmother     Social History   Socioeconomic History  . Marital status: Single    Spouse name: Not on file  . Number of children: Not on file  . Years of education: Not on file  . Highest education level: Not on file  Occupational History  . Not on file  Social Needs  . Financial resource strain: Not on file  . Food insecurity:    Worry: Not on file    Inability: Not on file  . Transportation needs:    Medical: Not on file    Non-medical: Not on file  Tobacco Use  . Smoking status: Light Tobacco Smoker    Types: Cigarettes    Last attempt to quit: 07/07/2014    Years since quitting: 4.3  . Smokeless tobacco: Never Used  . Tobacco  comment: may smoke 2 cigarettes per month  Substance and Sexual Activity  . Alcohol use: No  . Drug use: No  . Sexual activity: Yes    Birth control/protection: Surgical  Lifestyle  . Physical activity:    Days per week: Not on file    Minutes per session: Not on file  . Stress: Not on file  Relationships  . Social connections:    Talks on phone: Not on file    Gets together: Not on file    Attends religious service: Not on file    Active member of club or organization: Not on file    Attends meetings of clubs or organizations: Not on file    Relationship status: Not on file  . Intimate partner violence:    Fear of current or ex partner: Not on file    Emotionally abused: Not on file    Physically abused: Not on file    Forced sexual activity: Not on file  Other Topics Concern  . Not on file  Social History Narrative  . Not on file     Physical Exam  Vital Signs and Nursing Notes reviewed Vitals:   11/03/18 1458  BP: 124/85  Pulse: 93  Resp: 18  Temp: 98.7 F (37.1 C)  SpO2: 98%    CONSTITUTIONAL: Well-appearing, NAD NEURO:  Alert and oriented x 3, no focal deficits EYES:  eyes equal and reactive ENT/NECK:  no LAD, no JVD CARDIO: Regular rate, well-perfused, normal S1 and S2 PULM:  CTAB no wheezing or rhonchi GI/GU:  normal bowel sounds, non-distended, non-tender MSK/SPINE:  No gross deformities, minimal swelling to right knee SKIN:  no rash, atraumatic PSYCH:  Appropriate speech and behavior  Diagnostic and Interventional Summary    Labs Reviewed - No data to display  DG Knee Complete 4 Views Right  Final Result      Medications - No data to display   Procedures Critical Care  ED Course and Medical Decision Making  I have reviewed the triage vital signs and the nursing notes.  Pertinent labs & imaging results that were available during my care of the patient were reviewed by me and considered in my medical decision making (see below for  details).  X-ray negative for fracture, small traumatic effusion.  Normal neurological exam no head trauma, no indication for CNS or other imaging.  Will wrap here in the ED, recommend R ICE at home.  After the discussed management above, the patient was determined to be safe for discharge.  The patient was in agreement with this plan and all questions regarding their care were answered.  ED return precautions  were discussed and the patient will return to the ED with any significant worsening of condition.  Barth Kirks. Sedonia Small, MD [place] [email or web address]  Final Clinical Impressions(s) / ED Diagnoses     ICD-10-CM   1. Fall, initial encounter W19.XXXA   2. Acute pain of right knee M25.561     ED Discharge Orders         Ordered    HYDROcodone-acetaminophen (NORCO/VICODIN) 5-325 MG tablet  Every 4 hours PRN     11/03/18 1604             Maudie Flakes, MD 11/03/18 1605

## 2018-11-03 NOTE — ED Triage Notes (Signed)
Pt in after fall, landed on her knee on concrete floor, pt takes blood thinner, swelling to knee noted but controlled at this time, denies hitting her head no distress noted

## 2018-11-03 NOTE — Discharge Instructions (Addendum)
You were evaluated in the Emergency Department and after careful evaluation, we did not find any emergent condition requiring admission or further testing in the hospital.  Your symptoms today seem to be due to bruising from the fall.  Your x-ray did not show any broken bones.  Please rest the knee, ice it, use the Ace wrap provided, and elevate the knee to improve the pain and swelling.  You can use the pain medication described as needed for pain at night keeping her from sleeping.  We recommend Tylenol throughout the day.  Please return to the Emergency Department if you experience any worsening of your condition.  We encourage you to follow up with a primary care provider.  Thank you for allowing [place] to be a part of your care.

## 2018-11-22 ENCOUNTER — Other Ambulatory Visit: Payer: Self-pay | Admitting: Family Medicine

## 2018-11-23 ENCOUNTER — Encounter: Payer: Self-pay | Admitting: Family Medicine

## 2018-11-24 ENCOUNTER — Ambulatory Visit (INDEPENDENT_AMBULATORY_CARE_PROVIDER_SITE_OTHER): Payer: Medicare HMO

## 2018-11-24 DIAGNOSIS — I639 Cerebral infarction, unspecified: Secondary | ICD-10-CM | POA: Diagnosis not present

## 2018-11-24 MED ORDER — HYDROCODONE-ACETAMINOPHEN 10-325 MG PO TABS: 1.0000 | ORAL_TABLET | Freq: Three times a day (TID) | ORAL | 0 refills | Status: DC | PRN

## 2018-11-24 NOTE — Telephone Encounter (Signed)
Ok to refill??  Last office visit 09/17/2018.  Last refill 10/20/2018.

## 2018-11-25 LAB — CUP PACEART REMOTE DEVICE CHECK
Implantable Pulse Generator Implant Date: 20161208
MDC IDC SESS DTM: 20200127030840

## 2018-11-25 NOTE — Progress Notes (Signed)
Carelink Summary Report / Loop Recorder 

## 2018-12-18 ENCOUNTER — Encounter: Payer: Self-pay | Admitting: *Deleted

## 2018-12-19 ENCOUNTER — Other Ambulatory Visit: Payer: Self-pay | Admitting: Family Medicine

## 2018-12-25 ENCOUNTER — Encounter: Payer: Self-pay | Admitting: Family Medicine

## 2018-12-26 ENCOUNTER — Ambulatory Visit (INDEPENDENT_AMBULATORY_CARE_PROVIDER_SITE_OTHER): Payer: Medicare HMO | Admitting: *Deleted

## 2018-12-26 DIAGNOSIS — I639 Cerebral infarction, unspecified: Secondary | ICD-10-CM

## 2018-12-26 MED ORDER — HYDROCODONE-ACETAMINOPHEN 10-325 MG PO TABS: 1.0000 | ORAL_TABLET | Freq: Three times a day (TID) | ORAL | 0 refills | Status: DC | PRN

## 2018-12-26 NOTE — Telephone Encounter (Signed)
Ok to refill??  Last office visit 09/17/2018.  Last refill 11/24/2018.

## 2018-12-27 LAB — CUP PACEART REMOTE DEVICE CHECK
Implantable Pulse Generator Implant Date: 20161208
MDC IDC SESS DTM: 20200229030608

## 2018-12-31 ENCOUNTER — Telehealth: Payer: Self-pay | Admitting: Nurse Practitioner

## 2018-12-31 NOTE — Telephone Encounter (Signed)
LINQ alert for RRT - left message for patient to call. Ok to schedule with Dr Rayann Heman to discuss explant.  Chanetta Marshall, NP 12/31/2018 2:43 PM

## 2018-12-31 NOTE — Progress Notes (Signed)
Carelink Summary Report / Loop Recorder 

## 2019-01-01 NOTE — Telephone Encounter (Signed)
Patient returned call. She is agreeable to appointment with Dr. Rayann Heman to have ILR explanted. Advised scheduler will contact her to set this up. Patient requests call back this AM if possible as she is home and has access to her calendar.  Pt aware that she can unplug her monitor and aware she'll receive a return kit in the mail. She verbalizes understanding of instructions and thanked me for my call.

## 2019-01-02 ENCOUNTER — Telehealth: Payer: Self-pay | Admitting: Family Medicine

## 2019-01-02 DIAGNOSIS — Z860100 Personal history of colon polyps, unspecified: Secondary | ICD-10-CM

## 2019-01-02 DIAGNOSIS — Z1211 Encounter for screening for malignant neoplasm of colon: Secondary | ICD-10-CM

## 2019-01-02 NOTE — Telephone Encounter (Signed)
PT CALLED IN READY TO SET UP A COLONOSCOPY PLEASE PLACE ORDER SO SHANNON J CAN GET THIS SCHEDULED.

## 2019-01-02 NOTE — Telephone Encounter (Signed)
Ok to order 

## 2019-01-02 NOTE — Telephone Encounter (Signed)
Referral placed.

## 2019-01-14 ENCOUNTER — Encounter: Payer: Self-pay | Admitting: Family Medicine

## 2019-01-14 DIAGNOSIS — I10 Essential (primary) hypertension: Secondary | ICD-10-CM

## 2019-01-16 MED ORDER — ACYCLOVIR 400 MG PO TABS: 400.0000 mg | ORAL_TABLET | Freq: Two times a day (BID) | ORAL | 0 refills | Status: DC

## 2019-01-16 MED ORDER — HYDROCHLOROTHIAZIDE 25 MG PO TABS: 25.0000 mg | ORAL_TABLET | Freq: Every day | ORAL | 0 refills | Status: DC

## 2019-01-16 MED ORDER — HYDROCODONE-ACETAMINOPHEN 10-325 MG PO TABS: 1.0000 | ORAL_TABLET | Freq: Three times a day (TID) | ORAL | 0 refills | Status: DC | PRN

## 2019-01-16 MED ORDER — CLONIDINE HCL 0.1 MG PO TABS: 0.1000 mg | ORAL_TABLET | Freq: Two times a day (BID) | ORAL | 1 refills | Status: DC

## 2019-01-19 ENCOUNTER — Ambulatory Visit: Payer: Medicare HMO | Admitting: Family Medicine

## 2019-01-28 ENCOUNTER — Encounter: Payer: Medicare HMO | Admitting: Internal Medicine

## 2019-02-04 ENCOUNTER — Encounter: Payer: Self-pay | Admitting: Family Medicine

## 2019-02-19 ENCOUNTER — Other Ambulatory Visit: Payer: Self-pay | Admitting: Family Medicine

## 2019-02-25 ENCOUNTER — Ambulatory Visit: Payer: Medicare HMO | Admitting: Family Medicine

## 2019-03-02 ENCOUNTER — Telehealth (HOSPITAL_COMMUNITY): Payer: Self-pay | Admitting: *Deleted

## 2019-03-02 NOTE — Telephone Encounter (Signed)
Patient approved for tier exception for xarelto through 10/29/19 to a tier 1 drug cost. Pt notified of this.   Pt was denied xarelto patient assistance due to requirement of paying 4% of income out of pocket prior to approval. Pt verbalized understanding.

## 2019-03-10 ENCOUNTER — Other Ambulatory Visit: Payer: Self-pay | Admitting: *Deleted

## 2019-03-10 DIAGNOSIS — I10 Essential (primary) hypertension: Secondary | ICD-10-CM

## 2019-03-10 MED ORDER — HYDROCHLOROTHIAZIDE 25 MG PO TABS: 25.0000 mg | ORAL_TABLET | Freq: Every day | ORAL | 3 refills | Status: DC

## 2019-03-10 MED ORDER — ACYCLOVIR 400 MG PO TABS: 400.0000 mg | ORAL_TABLET | Freq: Two times a day (BID) | ORAL | 3 refills | Status: DC

## 2019-03-10 MED ORDER — ATORVASTATIN CALCIUM 40 MG PO TABS: 40.0000 mg | ORAL_TABLET | Freq: Every day | ORAL | 3 refills | Status: DC

## 2019-03-10 MED ORDER — CLONIDINE HCL 0.1 MG PO TABS: 0.1000 mg | ORAL_TABLET | Freq: Two times a day (BID) | ORAL | 3 refills | Status: DC

## 2019-03-11 ENCOUNTER — Encounter: Payer: Self-pay | Admitting: Family Medicine

## 2019-03-11 MED ORDER — HYDROCODONE-ACETAMINOPHEN 10-325 MG PO TABS: 1.0000 | ORAL_TABLET | Freq: Three times a day (TID) | ORAL | 0 refills | Status: DC | PRN

## 2019-03-11 NOTE — Telephone Encounter (Signed)
Routine meds have been sent to [facility].   Ok to refill Hydrocodone/APAP??  Last office visit 09/17/2018.  Last refill 01/16/2019.

## 2019-03-24 ENCOUNTER — Other Ambulatory Visit: Payer: Self-pay

## 2019-03-24 ENCOUNTER — Encounter: Payer: Self-pay | Admitting: Family Medicine

## 2019-03-24 ENCOUNTER — Ambulatory Visit (INDEPENDENT_AMBULATORY_CARE_PROVIDER_SITE_OTHER): Payer: 59 | Admitting: Family Medicine

## 2019-03-24 DIAGNOSIS — L02411 Cutaneous abscess of right axilla: Secondary | ICD-10-CM | POA: Diagnosis not present

## 2019-03-24 MED ORDER — SULFAMETHOXAZOLE-TRIMETHOPRIM 800-160 MG PO TABS: 1.0000 | ORAL_TABLET | Freq: Two times a day (BID) | ORAL | 0 refills | Status: DC

## 2019-03-24 MED ORDER — FLUCONAZOLE 150 MG PO TABS: 150.0000 mg | ORAL_TABLET | Freq: Once | ORAL | 0 refills | Status: AC

## 2019-03-24 NOTE — Telephone Encounter (Signed)
Spoke with patient and scheduled her for a telephone visit.

## 2019-03-24 NOTE — Progress Notes (Signed)
Virtual Visit via Telephone Note  I connected with Alicia Bolton on 03/24/19 at 4:08pm  by telephone and verified that I am speaking with the correct person using two identifiers.      Pt location: at home   Physician location:  In office, [facility], Vic Blackbird MD     On call: patient and physician   I discussed the limitations, risks, security and privacy concerns of performing an evaluation and management service by telephone and the availability of in person appointments. I also discussed with the patient that there may be a patient responsible charge related to this service. The patient expressed understanding and agreed to proceed.   History of Present Illness:  Pt sent message via mychart and picture. 2 days ago had swelling came in right axilla, has been using warm compress, still red and sore. No drainage  No compresses Using anti-bacterial soap  No fever, no vomiting, no chills     Observations/Objective: Reviewed picture small abscess with erythema, appears to small core/scab center  Assessment and Plan: Abscess of axilla- continue warm compresses size is decreasing some, add bactrim, continue anti-bacterial soap, pt to come in office if this worsens or does not resolve   Follow Up Instructions:    I discussed the assessment and treatment plan with the patient. The patient was provided an opportunity to ask questions and all were answered. The patient agreed with the plan and demonstrated an understanding of the instructions.   The patient was advised to call back or seek an in-person evaluation if the symptoms worsen or if the condition fails to improve as anticipated.  I provided 6 minutes of non-face-to-face time during this encounter. End Time : 4:14  Vic Blackbird, MD

## 2019-04-23 ENCOUNTER — Encounter: Payer: Self-pay | Admitting: Family Medicine

## 2019-04-24 MED ORDER — HYDROCODONE-ACETAMINOPHEN 10-325 MG PO TABS: 1.0000 | ORAL_TABLET | Freq: Three times a day (TID) | ORAL | 0 refills | Status: DC | PRN

## 2019-04-24 NOTE — Telephone Encounter (Signed)
Ok to refill??  Last office visit 03/24/2019.  Last refill 03/11/2019.

## 2019-05-24 ENCOUNTER — Encounter: Payer: Self-pay | Admitting: Family Medicine

## 2019-07-03 ENCOUNTER — Encounter: Payer: Self-pay | Admitting: Family Medicine

## 2019-07-07 MED ORDER — HYDROCODONE-ACETAMINOPHEN 10-325 MG PO TABS: 1.0000 | ORAL_TABLET | Freq: Three times a day (TID) | ORAL | 0 refills | Status: DC | PRN

## 2019-07-07 NOTE — Telephone Encounter (Signed)
Ok to refill??  Last office visit 03/24/2019.  Last refill 04/24/2019.

## 2019-07-12 ENCOUNTER — Encounter: Payer: Self-pay | Admitting: Family Medicine

## 2019-07-14 ENCOUNTER — Telehealth (HOSPITAL_COMMUNITY): Payer: Self-pay | Admitting: *Deleted

## 2019-07-14 NOTE — Telephone Encounter (Signed)
Patient called in stating she woke up this morning with her left eye in the corner red both on the eyeball and eyelid. She denies pain in the eye, itching, visual changes or headache. Pt will continue to monitor - if symptoms arise she will see her eye doctor.

## 2019-07-15 ENCOUNTER — Encounter: Payer: Self-pay | Admitting: Family Medicine

## 2019-07-30 ENCOUNTER — Encounter: Payer: Self-pay | Admitting: Family Medicine

## 2019-07-31 MED ORDER — NYSTATIN 100000 UNIT/ML MT SUSP: 5.0000 mL | Freq: Three times a day (TID) | OROMUCOSAL | 0 refills | Status: DC

## 2019-07-31 NOTE — Telephone Encounter (Signed)
Please advise 

## 2019-08-03 ENCOUNTER — Other Ambulatory Visit (HOSPITAL_COMMUNITY): Payer: Self-pay | Admitting: Nurse Practitioner

## 2019-08-03 NOTE — Telephone Encounter (Signed)
Xarelto 20mg  refill request received; pt is 63 years old, weight-66.2kg, Crea-0.88 on 09/17/2018, last seen by Roderic Palau on 08/07/2018, Diagnosis-Afib, CrCl-69.45ml/min; Dose is appropriate based on dosing criteria. Will send in refill to requested pharmacy.

## 2019-08-05 ENCOUNTER — Telehealth: Payer: Self-pay | Admitting: Internal Medicine

## 2019-08-05 NOTE — Telephone Encounter (Signed)
Patient would like to have loop recorder removed. She would like the earliest appointment in the morning she could get to come in to have device removed when there was an opening. Made aware that DR Allred's schedule is pretty full and may be a few weeks to a month before she can be scheduled. She is fine with the delay and stressed she would like first appointment of the day when she is scheduled.

## 2019-08-05 NOTE — Telephone Encounter (Signed)
° ° °  Patient has questions regarding having Medtronic device removed.  Please call

## 2019-08-07 ENCOUNTER — Encounter: Payer: Self-pay | Admitting: Family Medicine

## 2019-08-07 MED ORDER — HYDROCODONE-ACETAMINOPHEN 10-325 MG PO TABS: 1.0000 | ORAL_TABLET | Freq: Three times a day (TID) | ORAL | 0 refills | Status: DC | PRN

## 2019-08-07 NOTE — Telephone Encounter (Signed)
Ok to refill??  Last office visit 03/24/2019.  Last refill 07/07/2019.

## 2019-08-07 NOTE — Telephone Encounter (Signed)
PT had GI referral back in March to Dr. Collene Mares, she can call and set up the scope

## 2019-08-10 ENCOUNTER — Encounter: Payer: Self-pay | Admitting: Internal Medicine

## 2019-08-10 ENCOUNTER — Ambulatory Visit (INDEPENDENT_AMBULATORY_CARE_PROVIDER_SITE_OTHER): Payer: Medicare HMO | Admitting: Internal Medicine

## 2019-08-10 ENCOUNTER — Encounter: Payer: Medicare HMO | Admitting: Internal Medicine

## 2019-08-10 ENCOUNTER — Other Ambulatory Visit: Payer: Self-pay

## 2019-08-10 VITALS — BP 112/68 | HR 92 | Ht 65.0 in | Wt 141.6 lb

## 2019-08-10 DIAGNOSIS — J9859 Other diseases of mediastinum, not elsewhere classified: Secondary | ICD-10-CM | POA: Diagnosis not present

## 2019-08-10 DIAGNOSIS — I639 Cerebral infarction, unspecified: Secondary | ICD-10-CM

## 2019-08-10 DIAGNOSIS — I480 Paroxysmal atrial fibrillation: Secondary | ICD-10-CM | POA: Diagnosis not present

## 2019-08-10 HISTORY — PX: OTHER SURGICAL HISTORY: SHX169

## 2019-08-10 NOTE — Patient Instructions (Addendum)
Medication Instructions:  Your physician recommends that you continue on your current medications as directed. Please refer to the Current Medication list given to you today.  Labwork: None ordered.  Testing/Procedures: Your physician has requested that you have an echocardiogram. Echocardiography is a painless test that uses sound waves to create images of your heart. It provides your doctor with information about the size and shape of your heart and how well your heart's chambers and valves are working. This procedure takes approximately one hour. There are no restrictions for this procedure.  Please schedule for an ECHO  Follow-Up:  Your physician wants you to follow-up in: one year with Tommye Standard, PA.  You will receive a reminder letter in the mail two months in advance. If you don't receive a letter, please call our office to schedule the follow-up appointment.    Implantable Loop Recorder Placement, Care After Refer to this sheet in the next few weeks. These instructions provide you with information about caring for yourself after your procedure. Your health care provider may also give you more specific instructions. Your treatment has been planned according to current medical practices, but problems sometimes occur. Call your health care provider if you have any problems or questions after your procedure. What can I expect after the procedure? After the procedure, it is common to have:  Soreness or pain near the cut from surgery (incision).  Some swelling or bruising near the incision.  Follow these instructions at home:  Medicines  Take over-the-counter and prescription medicines only as told by your health care provider.  If you were prescribed an antibiotic medicine, take it as told by your health care provider. Do not stop taking the antibiotic even if you start to feel better.  Bathing Do not take baths, swim, or use a hot tub until your health care provider approves.  You may shower 24 hours after removal of your monitor.  Incision care  Follow instructions from your health care provider about how to take care of your incision. Make sure you: ? Remove your top dressing after 24 hours (before you shower) ? Leave stitches (sutures), skin glue, or adhesive strips in place. These skin closures may need to stay in place for 2 weeks or longer. If adhesive strip edges start to loosen and curl up, you may trim the loose edges. Do not remove adhesive strips completely unless your health care provider tells you to do that.  Check your incision area every day for signs of infection. Check for: ? More redness, swelling, or pain. ? Fluid or blood. ? Warmth. ? Pus or a bad smell.  Contact a health care provider if:  You have more redness, swelling, or pain around your incision.  You have more fluid or blood coming from your incision.  Your incision feels warm to the touch.  You have pus or a bad smell coming from your incision.  You have a fever.  You have pain that is not relieved by your pain medicine.  You have triggered your device because of fainting (syncope) or because of a heartbeat that feels like it is racing, slow, fluttering, or skipping (palpitations).

## 2019-08-10 NOTE — Progress Notes (Signed)
PCP: Alycia Rossetti, MD   Primary EP: Dr Rayann Heman  Alicia Bolton is a 63 y.o. female who presents today for routine electrophysiology followup.  Since last being seen in our clinic, the patient reports doing very well.  Today, she denies symptoms of palpitations, chest pain, shortness of breath,  lower extremity edema, dizziness, presyncope, or syncope.  The patient is otherwise without complaint today.   Past Medical History:  Diagnosis Date  . Anxiety    takes Ativan daily as needed  . Arthritis   . Fibromyalgia   . History of bronchitis    few yrs ago  . History of colon polyps    benign  . History of migraine    last one over a month ago  . Hyperlipidemia    takes Atorvastatin daily  . Hypertension    takes Clonidine and HCTZ daily  . Joint pain   . Migraines   . Neuromuscular disorder ([place])   . Nocturia   . Stroke [facility] [address]) [id number]   Embolic Right ACA,  ILR placed Dr. Rayann Heman.States slight weakness in left   Past Surgical History:  Procedure Laterality Date  . ABDOMINAL HYSTERECTOMY  1997  . ANTERIOR CERVICAL DECOMP/DISCECTOMY FUSION N/A 10/25/2014   Procedure: CERVICAL THREE-FOUR, CERVICAL FOUR FIVE ANTERIOR CERVICAL DECOMPRESSION/DISCECTOMY FUSION 2 LEVEL/HARDWARE REMOVALCERVICAL FIVE-SEVEN;  Surgeon: Elaina Hoops, MD;  Location: [place] NEURO ORS;  Service: Neurosurgery;  Laterality: N/A;  . BREAST DUCTAL SYSTEM EXCISION Left 11/14/2016   Procedure: EXCISION DUCTAL SYSTEM LEFT BREAST;  Surgeon: Autumn Messing III, MD;  Location: [place];  Service: General;  Laterality: Left;  . BREAST EXCISIONAL BIOPSY Right    benign  . BREAST EXCISIONAL BIOPSY Left    benign  . CARPAL TUNNEL RELEASE Left 10/25/2014   Procedure: CARPAL TUNNEL RELEASE;  Surgeon: Elaina Hoops, MD;  Location: [place] NEURO ORS;  Service: Neurosurgery;  Laterality: Left;  . CARPAL TUNNEL RELEASE Right   . CERVICAL FUSION  2007  . CESAREAN SECTION  1985  . CHEST SURGERY  04/2015   Benign chest mass removed- [place]  . COLONOSCOPY    . EP IMPLANTABLE DEVICE N/A 10/06/2015   Procedure: Loop Recorder Insertion;  Surgeon: Thompson Grayer, MD;  Location: [place] CV LAB;  Service: Cardiovascular;  Laterality: N/A;  . KNEE ARTHROSCOPY Right 1999  . KNEE ARTHROSCOPY WITH MEDIAL MENISECTOMY Right 10/09/2013   Procedure: RIGHT KNEE ARTHROSCOPY WITH MEDIAL MENISECTOMY;  Surgeon: Johnny Bridge, MD;  Location: [place];  Service: Orthopedics;  Laterality: Right;  clean  . TEE WITHOUT CARDIOVERSION N/A 10/05/2015   Procedure: TRANSESOPHAGEAL ECHOCARDIOGRAM (TEE);  Surgeon: Thayer Headings, MD;  Location: [facility] ENDOSCOPY;  Service: Cardiovascular;  Laterality: N/A;  . thumb surgery Right     ROS- all systems are reviewed and negatives except as per HPI above  Current Outpatient Medications  Medication Sig Dispense Refill  . acetaminophen (TYLENOL) 500 MG tablet Take 500-1,000 mg by mouth every 6 (six) hours as needed for headache (depends on pain).    Marland Kitchen acyclovir (ZOVIRAX) 400 MG tablet Take 1 tablet (400 mg total) by mouth 2 (two) times daily. 180 tablet 3  . atorvastatin (LIPITOR) 40 MG tablet Take 1 tablet (40 mg total) by mouth daily. 90 tablet 3  . cloNIDine (CATAPRES) 0.1 MG tablet Take 1 tablet (0.1 mg total) by mouth 2 (two) times daily. 180 tablet 3  . clotrimazole (LOTRIMIN) 1 %  cream Apply 1 application topically 2 (two) times daily. To face 30 g 1  . hydrochlorothiazide (HYDRODIURIL) 25 MG tablet Take 1 tablet (25 mg total) by mouth daily. 90 tablet 3  . HYDROcodone-acetaminophen (NORCO) 10-325 MG tablet Take 1 tablet by mouth every 8 (eight) hours as needed. 30 tablet 0  . LORazepam (ATIVAN) 0.5 MG tablet Take 1 tablet (0.5 mg total) by mouth 2 (two) times daily as needed. for anxiety 30 tablet 1  . nystatin (MYCOSTATIN) 100000 UNIT/ML suspension Use as directed 5 mLs (500,000 Units total) in the mouth or throat 3 (three) times daily. Swish and swallow. 473  mL 0  . sulfamethoxazole-trimethoprim (BACTRIM DS) 800-160 MG tablet Take 1 tablet by mouth 2 (two) times daily. 14 tablet 0  . tiZANidine (ZANAFLEX) 4 MG tablet Take 1 tablet (4 mg total) by mouth every 6 (six) hours as needed for muscle spasms. 30 tablet 1  . XARELTO 20 MG TABS tablet TAKE 1 TABLET BY MOUTH  DAILY WITH  SUPPER 30 tablet 5   No current facility-administered medications for this visit.     Physical Exam: Vitals:   08/10/19 0806  BP: 112/68  Pulse: 92  SpO2: 90%  Weight: 141 lb 9.6 oz (64.2 kg)  Height: 5\' 5"  (1.651 m)    GEN- The patient is well appearing, alert and oriented x 3 today.   Head- normocephalic, atraumatic Eyes-  Sclera clear, conjunctiva pink Ears- hearing intact Oropharynx- clear Lungs- Clear to ausculation bilaterally, normal work of breathing Heart- Regular rate and rhythm, no murmurs, rubs or gallops, PMI not laterally displaced GI- soft, NT, ND, + BS Extremities- no clubbing, cyanosis, or edema  Wt Readings from Last 3 Encounters:  08/10/19 141 lb 9.6 oz (64.2 kg)  09/17/18 146 lb (66.2 kg)  08/07/18 143 lb (64.9 kg)    Assessment and Plan:  1. Paroxysmal atrial fibrillation asymptomatic On xarelto for chads2vasc score of 4 Her ILR is at EOL.  She wishes to have this device removed.  Risks and benefits to ILR removal were discussed with the patient who wishes to proceed.  2 HTN Stable No change required today  3. Prior mediastinal mass  S/p resection in 2016.  Will obtain echo to evaluate for any cardiac issues remaining  Return to see EP PA annually  Thompson Grayer MD, [facility] 08/10/2019 8:29 AM     DESCRIPTION OF PROCEDURE:  Informed written consent was obtained.  The patient required no sedation for the procedure today.   The patients left chest was therefore prepped and draped in the usual sterile fashion.  The skin overlying the ILR monitor was infiltrated with lidocaine for local analgesia.  A 0.5-cm incision was made over the  site.  The previously implanted ILR was exposed and removed using a combination of sharp and blunt dissection.  Steri- Strips and a sterile dressing were then applied. EBL<44ml.  There were no early apparent complications.     CONCLUSIONS:   1. Successful explantation of a Medtronic Reveal LINQ implantable loop recorder   2. No early apparent complications.        Thompson Grayer MD, [facility] 08/10/2019 8:51 AM

## 2019-08-25 ENCOUNTER — Ambulatory Visit: Payer: 59

## 2019-08-26 ENCOUNTER — Encounter (HOSPITAL_COMMUNITY): Payer: Self-pay | Admitting: Internal Medicine

## 2019-08-26 ENCOUNTER — Other Ambulatory Visit (HOSPITAL_COMMUNITY): Payer: Medicare HMO

## 2019-09-08 ENCOUNTER — Other Ambulatory Visit: Payer: Self-pay | Admitting: Family Medicine

## 2019-09-08 ENCOUNTER — Encounter: Payer: Self-pay | Admitting: Family Medicine

## 2019-09-09 ENCOUNTER — Encounter: Payer: Self-pay | Admitting: Family Medicine

## 2019-09-09 MED ORDER — HYDROCODONE-ACETAMINOPHEN 10-325 MG PO TABS: 1.0000 | ORAL_TABLET | Freq: Three times a day (TID) | ORAL | 0 refills | Status: DC | PRN

## 2019-09-09 MED ORDER — CLINDAMYCIN HCL 300 MG PO CAPS: 300.0000 mg | ORAL_CAPSULE | Freq: Three times a day (TID) | ORAL | 0 refills | Status: DC

## 2019-09-09 NOTE — Telephone Encounter (Signed)
Last office visit: 03/24/2019 Last refilled: 08/07/2019

## 2019-09-15 ENCOUNTER — Other Ambulatory Visit: Payer: Self-pay

## 2019-09-15 ENCOUNTER — Encounter (HOSPITAL_COMMUNITY): Payer: Self-pay | Admitting: Emergency Medicine

## 2019-09-15 ENCOUNTER — Emergency Department (HOSPITAL_COMMUNITY)
Admission: EM | Admit: 2019-09-15 | Discharge: 2019-09-15 | Disposition: A | Discharge status: Home / Self Care | Payer: Medicare HMO | Attending: Emergency Medicine | Admitting: Emergency Medicine

## 2019-09-15 DIAGNOSIS — Z5321 Procedure and treatment not carried out due to patient leaving prior to being seen by health care provider: Secondary | ICD-10-CM | POA: Diagnosis not present

## 2019-09-15 DIAGNOSIS — K047 Periapical abscess without sinus: Secondary | ICD-10-CM | POA: Diagnosis present

## 2019-09-15 DIAGNOSIS — R220 Localized swelling, mass and lump, head: Secondary | ICD-10-CM | POA: Diagnosis not present

## 2019-09-15 NOTE — ED Notes (Signed)
Pt has been called x4. NO response.

## 2019-09-15 NOTE — ED Triage Notes (Signed)
Pt sent by dentist for IV abx, pt had tooth removed and developed an abscess, has been taking home antibiotics, pt reports increased swelling and redness to left side of face.

## 2019-09-16 ENCOUNTER — Telehealth (HOSPITAL_COMMUNITY): Payer: Self-pay

## 2019-09-16 MED ORDER — FLUCONAZOLE 150 MG PO TABS: 150.0000 mg | ORAL_TABLET | Freq: Once | ORAL | 0 refills | Status: AC

## 2019-09-16 NOTE — Telephone Encounter (Signed)
New message    Just an FYI. We have made several attempts to contact this patient including sending a letter to schedule or reschedule their echocardiogram. We will be removing the patient from the echo WQ.   11.16.20 @ 12:33pm both # are the same lm on home vm - Deiondra Denley  11.9.20 @ 3:27pm lm on home vm  - Shenice Dolder   10.28.20 mail reminder letter Jari Sportsman   10.28.20 no show

## 2019-09-16 NOTE — Telephone Encounter (Signed)
Appointment scheduled.   Prescription sent to pharmacy.

## 2019-09-16 NOTE — Addendum Note (Signed)
Addended by: Sheral Flow on: 09/16/2019 08:23 AM   Modules accepted: Orders

## 2019-09-18 ENCOUNTER — Ambulatory Visit (INDEPENDENT_AMBULATORY_CARE_PROVIDER_SITE_OTHER): Payer: Medicare HMO | Admitting: Family Medicine

## 2019-09-18 ENCOUNTER — Other Ambulatory Visit: Payer: Self-pay

## 2019-09-18 ENCOUNTER — Encounter: Payer: Self-pay | Admitting: Family Medicine

## 2019-09-18 VITALS — BP 122/80 | HR 84 | Temp 98.1°F | Resp 16 | Ht 65.0 in | Wt 140.0 lb

## 2019-09-18 DIAGNOSIS — K047 Periapical abscess without sinus: Secondary | ICD-10-CM | POA: Diagnosis not present

## 2019-09-18 DIAGNOSIS — I480 Paroxysmal atrial fibrillation: Secondary | ICD-10-CM

## 2019-09-18 DIAGNOSIS — M5136 Other intervertebral disc degeneration, lumbar region: Secondary | ICD-10-CM | POA: Diagnosis not present

## 2019-09-18 DIAGNOSIS — E782 Mixed hyperlipidemia: Secondary | ICD-10-CM | POA: Diagnosis not present

## 2019-09-18 DIAGNOSIS — I10 Essential (primary) hypertension: Secondary | ICD-10-CM

## 2019-09-18 MED ORDER — FLUCONAZOLE 150 MG PO TABS: ORAL_TABLET | ORAL | 0 refills | Status: DC

## 2019-09-18 MED ORDER — CLINDAMYCIN HCL 300 MG PO CAPS: 600.0000 mg | ORAL_CAPSULE | Freq: Three times a day (TID) | ORAL | 0 refills | Status: DC

## 2019-09-18 NOTE — Patient Instructions (Addendum)
Use warm compress   New antibiotic clindamycin  F/U 4 months physical

## 2019-09-18 NOTE — Progress Notes (Signed)
   Subjective:    Patient ID: Alicia Bolton, female    DOB: 1955-12-10, 63 y.o.   MRN: [id number]  Patient presents for Dental Infection (has swollen face )     Pt here with continued facial swelling on left side with hardness over cheek,below left eye. She was given clindamycin on 11/11 due to concern for dental abscess and recurrent boil on left buttocks. See mychart notes. She was then  given amoxocillin and ibuprofen from the dentist after she had her tooth pulled from the abscess on Friday 11/13. She didn't realize it was amoxicllin then had allergic reaction, so she stopped. Then they prescribed her clindamycin. Her facial swelling did not improve so she went to ER on 11/17 but had bad experience and long wait so she left without being seen.   she is still concerned about the hardness and swelling of her left cheek.  No fever, no difficulty breathing, she is finishing up her clindamycin  the abscess on her left buttucks has gone down   PAF- loop recorder is out, she has f/u with cardiology soon, no chest pain,no palpitations, no SOB   Chronic back/neck pain- continues on pain meds   Review Of Systems:  GEN- denies fatigue, fever, weight loss,weakness, recent illness HEENT- denies eye drainage, change in vision, nasal discharge, CVS- denies chest pain, palpitations RESP- denies SOB, cough, wheeze ABD- denies N/V, change in stools, abd pain GU- denies dysuria, hematuria, dribbling, incontinence MSK- denies joint pain, muscle aches, injury Neuro- denies headache, dizziness, syncope, seizure activity       Objective:    BP 122/80   Pulse 84   Temp 98.1 F (36.7 C) (Temporal)   Resp 16   Ht 5\' 5"  (1.651 m)   Wt 140 lb (63.5 kg)   BMI 23.30 kg/m  GEN- NAD, alert and oriented x3 HEENT- PERRL, EOMI, non injected sclera, pink conjunctiva, MMM, oropharynx clear , induration on beneath left eye and in nasolabial fold, swelling of upper left gumline, mild bogginess, mild  tenderness  Neck- Supple, small shotty submandibular LAD  CVS- RRR, no murmur RESP-CTAB EXT- No edema Pulses- Radial  2+        Assessment & Plan:      Problem List Items Addressed This Visit      Unprioritized   DDD (degenerative disc disease), lumbar    Continue hydrocodone prn      HTN (hypertension) - Primary    Well controlled no changes       Relevant Orders   Comprehensive metabolic panel (Completed)   CBC with Differential (Completed)   Hyperlipidemia   Relevant Orders   Lipid Panel (Completed)   PAF (paroxysmal atrial fibrillation) (HCC)    F/u cardiology, fasting labs obtained today  No recurrence       Other Visit Diagnoses    Dental abscess       warm compresses, increase clindamycin to 600mg  TID, if still not improved in a week, then needs imaging      Note: This dictation was prepared with Dragon dictation along with smaller phrase technology. Any transcriptional errors that result from this process are unintentional.

## 2019-09-19 LAB — CBC WITH DIFFERENTIAL/PLATELET
Absolute Monocytes: 522 cells/uL (ref 200–950)
Basophils Absolute: 36 cells/uL (ref 0–200)
Basophils Relative: 0.4 %
Eosinophils Absolute: 135 cells/uL (ref 15–500)
Eosinophils Relative: 1.5 %
HCT: 35.8 % (ref 35.0–45.0)
Hemoglobin: 12.2 g/dL (ref 11.7–15.5)
Lymphs Abs: 3483 cells/uL (ref 850–3900)
MCH: 32.1 pg (ref 27.0–33.0)
MCHC: 34.1 g/dL (ref 32.0–36.0)
MCV: 94.2 fL (ref 80.0–100.0)
MPV: 9.4 fL (ref 7.5–12.5)
Monocytes Relative: 5.8 %
Neutro Abs: 4824 cells/uL (ref 1500–7800)
Neutrophils Relative %: 53.6 %
Platelets: 403 10*3/uL — ABNORMAL HIGH (ref 140–400)
RBC: 3.8 10*6/uL (ref 3.80–5.10)
RDW: 13.6 % (ref 11.0–15.0)
Total Lymphocyte: 38.7 %
WBC: 9 10*3/uL (ref 3.8–10.8)

## 2019-09-19 LAB — COMPREHENSIVE METABOLIC PANEL
AG Ratio: 1.3 (calc) (ref 1.0–2.5)
ALT: 9 U/L (ref 6–29)
AST: 14 U/L (ref 10–35)
Albumin: 3.9 g/dL (ref 3.6–5.1)
Alkaline phosphatase (APISO): 79 U/L (ref 37–153)
BUN: 11 mg/dL (ref 7–25)
CO2: 27 mmol/L (ref 20–32)
Calcium: 8.9 mg/dL (ref 8.6–10.4)
Chloride: 100 mmol/L (ref 98–110)
Creat: 0.9 mg/dL (ref 0.50–0.99)
Globulin: 3 g/dL (calc) (ref 1.9–3.7)
Glucose, Bld: 98 mg/dL (ref 65–99)
Potassium: 3.7 mmol/L (ref 3.5–5.3)
Sodium: 138 mmol/L (ref 135–146)
Total Bilirubin: 0.5 mg/dL (ref 0.2–1.2)
Total Protein: 6.9 g/dL (ref 6.1–8.1)

## 2019-09-19 LAB — LIPID PANEL
Cholesterol: 137 mg/dL (ref <200)
HDL: 39 mg/dL — ABNORMAL LOW (ref >=50)
LDL Cholesterol (Calc): 80 mg/dL (calc)
Non-HDL Cholesterol (Calc): 98 mg/dL (calc) (ref <130)
Total CHOL/HDL Ratio: 3.5 (calc) (ref <5.0)
Triglycerides: 101 mg/dL (ref <150)

## 2019-09-20 ENCOUNTER — Encounter: Payer: Self-pay | Admitting: Family Medicine

## 2019-09-20 NOTE — Assessment & Plan Note (Signed)
Well controlled no changes 

## 2019-09-20 NOTE — Assessment & Plan Note (Signed)
F/u cardiology, fasting labs obtained today  No recurrence

## 2019-09-20 NOTE — Assessment & Plan Note (Signed)
Continue hydrocodone prn  

## 2019-09-28 ENCOUNTER — Telehealth: Payer: Self-pay | Admitting: Internal Medicine

## 2019-09-28 ENCOUNTER — Encounter (HOSPITAL_COMMUNITY): Payer: Self-pay | Admitting: Internal Medicine

## 2019-09-28 DIAGNOSIS — J9859 Other diseases of mediastinum, not elsewhere classified: Secondary | ICD-10-CM

## 2019-09-28 NOTE — Telephone Encounter (Signed)
New Message  Patient wants to reschedule echocardiogram, but order has been cancelled. Please put in order for echo so that the appointment can be rescheduled for patient.

## 2019-09-28 NOTE — Telephone Encounter (Signed)
Order re-entered

## 2019-10-05 ENCOUNTER — Ambulatory Visit (HOSPITAL_COMMUNITY): Payer: Medicare HMO | Attending: Cardiology

## 2019-10-05 ENCOUNTER — Other Ambulatory Visit: Payer: Self-pay

## 2019-10-05 DIAGNOSIS — J9859 Other diseases of mediastinum, not elsewhere classified: Secondary | ICD-10-CM | POA: Insufficient documentation

## 2019-10-12 ENCOUNTER — Other Ambulatory Visit: Payer: Self-pay | Admitting: Family Medicine

## 2019-10-12 ENCOUNTER — Encounter: Payer: Self-pay | Admitting: Family Medicine

## 2019-10-12 MED ORDER — HYDROCODONE-ACETAMINOPHEN 10-325 MG PO TABS: 1.0000 | ORAL_TABLET | Freq: Three times a day (TID) | ORAL | 0 refills | Status: DC | PRN

## 2019-10-12 NOTE — Telephone Encounter (Signed)
Ok to refill??  Last office visit 09/18/2019.  Last refill 09/09/2019.

## 2019-10-19 ENCOUNTER — Telehealth (HOSPITAL_COMMUNITY): Payer: Self-pay | Admitting: *Deleted

## 2019-10-19 NOTE — Telephone Encounter (Signed)
Patient approved for tier exception on Xarelto through 10/28/20. Pt notified.

## 2019-10-26 ENCOUNTER — Encounter: Payer: Self-pay | Admitting: *Deleted

## 2019-11-02 ENCOUNTER — Encounter: Payer: Self-pay | Admitting: Family Medicine

## 2019-11-03 NOTE — Progress Notes (Signed)
Cardiology Office Note:    Date:  11/04/2019   ID:  [name], DOB 14-Jun-1956, MRN [id number]  PCP:  Alycia Rossetti, MD  Cardiologist/Electrophysiologist:  Thompson Grayer, MD / Richardson Dopp, PA-C    Referring MD: Alycia Rossetti, MD   Chief Complaint  Patient presents with  . Follow-up    abnormal echo    History of Present Illness:    Alicia Bolton is a 64 y.o. 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 Than was last seen by Dr. Rayann Heman in 07/2019.  Her ILR was at EOL and it was explanted.  Given her prior hx of mediastinal mass resection, a follow up echocardiogram was obtained.  This demonstrated mildly reduced LVF with EF 45-50 and she was scheduled for follow up with me.  She returns for follow-up.  We reviewed the findings of her echocardiogram.  We also discussed the events that led up to her mediastinal mass resection and thymectomy.  I reviewed her CT scan from 2016.  She did have some atheromatous disease in the aortic arch and descending thoracic aorta.  However, there was no mention of coronary artery calcification.  She does not have a history of chest discomfort, syncope, orthopnea, lower extremity swelling.  She has felt more short of breath with certain activities since her stroke and mediastinal mass excision.  This has been ongoing for the last 1 to 2 years.  She has not really noticed any significant worsening.  She smokes cigarettes on occasion.  She does not drink alcohol or use drugs.    Prior CV studies:   The following studies were reviewed today:  Echocardiogram 10/05/2019 EF 45-50, GLS -20.4, Gr 1 DD, mild MR  Echocardiogram 10/03/15 EF 55, no RWMA, Gr 1 DD, PASP 24  Past Medical History:  Diagnosis Date  .  Anxiety    takes Ativan daily as needed  . Arthritis   . Carpal tunnel syndrome 07/17/2013  . Chest mass 04/17/2015  . Chronic migraine 02/08/2016  . DDD (degenerative disc disease), lumbar 04/17/2017  . DISORDER, TOBACCO USE 06/27/2007   Qualifier: Diagnosis of  By: Jobe Igo MD, Shanon Brow    . Elevated liver function tests 10/04/2014  . Fibromyalgia   . GAD (generalized anxiety disorder) 10/10/2017  . History of bronchitis    few yrs ago  . History of colon polyps    benign  . History of migraine    last one over a month ago  . Hyperlipidemia    takes Atorvastatin daily  . Hypertension    takes Clonidine and HCTZ daily  . Joint pain   . Migraines   . Muscle spasms of neck 06/06/2016  . Neuromuscular disorder ([place])   . Neuropathy 04/25/2016  . Nocturia   . PAF (paroxysmal atrial fibrillation) ([place]) 09/17/2018  . Recurrent boils 04/25/2016  . Spinal stenosis of cervical region 10/25/2014  . Stroke [facility]) [id number]   Embolic Right ACA,  ILR placed Dr. Rayann Heman.States slight weakness in left  . Tear of medial meniscus of right knee 10/09/2013   Surgical Hx: The patient  has a past surgical history that includes Cervical fusion (2007); Cesarean section (1985); Abdominal hysterectomy (1997); Cholecystectomy (1998); Knee arthroscopy (Right, 1999); Colonoscopy; Knee arthroscopy with medial menisectomy (Right, 10/09/2013);  Anterior cervical decomp/discectomy fusion (N/A, 10/25/2014); Carpal tunnel release (Left, 10/25/2014); TEE without cardioversion (N/A, 10/05/2015); Cardiac catheterization (N/A, 10/06/2015); Chest surgery (04/2015); Carpal tunnel release (Right); thumb surgery (Right); Breast ductal system excision (Left, 11/14/2016); Breast excisional biopsy (Right); Breast excisional biopsy (Left); and implantable loop recorder removal (08/10/2019).   Current Medications: Current Meds  Medication Sig  . acetaminophen (TYLENOL) 500 MG tablet Take 500-1,000 mg by mouth every 6 (six) hours as needed for  headache (depends on pain).  Marland Kitchen acyclovir (ZOVIRAX) 400 MG tablet Take 1 tablet (400 mg total) by mouth 2 (two) times daily.  Marland Kitchen atorvastatin (LIPITOR) 40 MG tablet Take 1 tablet (40 mg total) by mouth daily.  . clindamycin (CLEOCIN) 300 MG capsule Take 2 capsules (600 mg total) by mouth 3 (three) times daily. For  7 days  . cloNIDine (CATAPRES) 0.1 MG tablet Take 1 tablet (0.1 mg total) by mouth 2 (two) times daily.  . clotrimazole (LOTRIMIN) 1 % cream Apply 1 application topically 2 (two) times daily. To face  . fluconazole (DIFLUCAN) 150 MG tablet Take 1 tablet repeat in 3 days  . hydrochlorothiazide (HYDRODIURIL) 25 MG tablet Take 1 tablet (25 mg total) by mouth daily.  Marland Kitchen HYDROcodone-acetaminophen (NORCO) 10-325 MG tablet Take 1 tablet by mouth every 8 (eight) hours as needed.  Marland Kitchen LORazepam (ATIVAN) 0.5 MG tablet Take 1 tablet (0.5 mg total) by mouth 2 (two) times daily as needed. for anxiety  . nystatin (MYCOSTATIN) 100000 UNIT/ML suspension Use as directed 5 mLs (500,000 Units total) in the mouth or throat 3 (three) times daily. Swish and swallow.  Marland Kitchen tiZANidine (ZANAFLEX) 4 MG tablet Take 1 tablet (4 mg total) by mouth every 6 (six) hours as needed for muscle spasms.  Alveda Reasons 20 MG TABS tablet TAKE 1 TABLET BY MOUTH  DAILY WITH  SUPPER     Allergies:   Azithromycin, Floxin [ofloxacin], and Penicillins   Social History   Tobacco Use  . Smoking status: Light Tobacco Smoker    Types: Cigarettes    Last attempt to quit: 07/07/2014    Years since quitting: 5.3  . Smokeless tobacco: Never Used  . Tobacco comment: may smoke 2 cigarettes per month  Substance Use Topics  . Alcohol use: No  . Drug use: No     Family Hx: The patient's family history includes Cancer in her maternal grandmother; Diabetes in her mother; Hypertension in her mother; Stroke in her maternal grandmother.  ROS:   Please see the history of present illness.    ROS All other systems reviewed and are  negative.   EKGs/Labs/Other Test Reviewed:    EKG:  EKG is  ordered today.  The ekg ordered today demonstrates normal sinus rhythm, heart rate 79, left axis deviation, no ST-T wave changes, no evidence of preexcitation, PR 142 ms, QTC 472 ms  Recent Labs: 09/18/2019: ALT 9; BUN 11; Creat 0.90; Hemoglobin 12.2; Platelets 403; Potassium 3.7; Sodium 138   Recent Lipid Panel Lab Results  Component Value Date/Time   CHOL 137 09/18/2019 10:16 AM   TRIG 101 09/18/2019 10:16 AM   HDL 39 (L) 09/18/2019 10:16 AM   CHOLHDL 3.5 09/18/2019 10:16 AM   LDLCALC 80 09/18/2019 10:16 AM    Physical Exam:    VS:  BP 112/70   Pulse 87   Ht 5\' 5"  (1.651 m)   Wt 141 lb 12.8 oz (64.3 kg)   SpO2 96%   BMI 23.60 kg/m     Wt  Readings from Last 3 Encounters:  11/04/19 141 lb 12.8 oz (64.3 kg)  09/18/19 140 lb (63.5 kg)  09/15/19 141 lb (64 kg)     Physical Exam  Constitutional: She is oriented to person, place, and time. She appears well-developed and well-nourished. No distress.  HENT:  Head: Normocephalic and atraumatic.  Eyes: No scleral icterus.  Neck: No JVD present. No thyromegaly present.  Cardiovascular: Normal rate, regular rhythm and normal heart sounds.  No murmur heard. Pulmonary/Chest: Effort normal and breath sounds normal. She has no rales.  Abdominal: Soft. There is no hepatomegaly.  Musculoskeletal:        General: No edema.  Lymphadenopathy:    She has no cervical adenopathy.  Neurological: She is alert and oriented to person, place, and time.  Skin: Skin is warm and dry.  Psychiatric: She has a normal mood and affect.    ASSESSMENT & PLAN:    1. Dilated cardiomyopathy ([place]) Echocardiogram in December 2016 demonstrated an EF of 55% with mild diastolic dysfunction.  Recent echocardiogram in December 2020 demonstrated EF 45-50% with mild diastolic dysfunction and mild mitral regurgitation.  Global longitudinal strain is normal at -20.4 percent.  She has mild dyspnea with  exertion but no signs of volume excess.  She is NYHA 2.  Etiology of her cardiomyopathy is not entirely clear.  As her GLS is normal, I question if her EF is actually better than the estimate. We discussed the possibility of proceeding with coronary CTA versus nuclear stress test to evaluate for an ischemic cardiomyopathy.  As she had no evidence of coronary calcification on her prior chest CT, I have recommended proceeding with a stress Myoview.  We also discussed the benefits of ACE inhibitor/ARB as well as beta-blocker therapy.  Her blood pressure is optimal and she would like to avoid coming off of clonidine as it has worked so well for so many years.  -Start Metoprolol Succinate 25 mg Q PM  -Arrange Lexiscan Myoview  -Consider cMRI if EF variable or worse on Myoview  -FU with me in 6-8 weeks.  2. Paroxysmal atrial fibrillation (HCC) CHA2DS2-VASc=4.  Continue anticoagulation with Rivaroxaban.     3. Essential hypertension The patient's blood pressure is controlled on her current regimen.  Continue current therapy.    4. Mixed hyperlipidemia Continue statin Rx.     Dispo:  Return in about 8 weeks (around 12/30/2019) for Routine Follow Up, w/ Richardson Dopp, PA-C, (virtual or in-person).   Medication Adjustments/Labs and Tests Ordered: Current medicines are reviewed at length with the patient today.  Concerns regarding medicines are outlined above.  Tests Ordered: Orders Placed This Encounter  Procedures  . Myocardial Perfusion Imaging  . EKG 12-Lead   Medication Changes: Meds ordered this encounter  Medications  . metoprolol succinate (TOPROL XL) 25 MG 24 hr tablet    Sig: Take 1 tablet (25 mg total) by mouth daily.    Dispense:  90 tablet    Refill:  3    Signed, Richardson Dopp, PA-C  11/04/2019 11:24 AM    [facility] [place], [place], [place]  [address] Phone: [phone]; Fax: [phone]

## 2019-11-04 ENCOUNTER — Ambulatory Visit (INDEPENDENT_AMBULATORY_CARE_PROVIDER_SITE_OTHER): Payer: Medicare HMO | Admitting: Physician Assistant

## 2019-11-04 ENCOUNTER — Encounter: Payer: Self-pay | Admitting: Physician Assistant

## 2019-11-04 ENCOUNTER — Other Ambulatory Visit: Payer: Self-pay

## 2019-11-04 VITALS — BP 112/70 | HR 87 | Ht 65.0 in | Wt 141.8 lb

## 2019-11-04 DIAGNOSIS — E782 Mixed hyperlipidemia: Secondary | ICD-10-CM

## 2019-11-04 DIAGNOSIS — I480 Paroxysmal atrial fibrillation: Secondary | ICD-10-CM | POA: Diagnosis not present

## 2019-11-04 DIAGNOSIS — I420 Dilated cardiomyopathy: Secondary | ICD-10-CM | POA: Diagnosis not present

## 2019-11-04 DIAGNOSIS — I10 Essential (primary) hypertension: Secondary | ICD-10-CM

## 2019-11-04 MED ORDER — METOPROLOL SUCCINATE ER 25 MG PO TB24: 25.0000 mg | ORAL_TABLET | Freq: Every day | ORAL | 3 refills | Status: DC

## 2019-11-04 NOTE — Patient Instructions (Addendum)
Medication Instructions:   Your physician has recommended you make the following change in your medication:   1) Start Toprol XL 25MG , 1 tablet by mouth once a day (take at night).  *If you need a refill on your cardiac medications before your next appointment, please call your pharmacy*  Lab Work:  None ordered today  Testing/Procedures:  Your physician has requested that you have a lexiscan myoview. For further information please visit [email or web address]. Please follow instruction sheet, as given.  Follow-Up: At [facility], you and your health needs are our priority.  As part of our continuing mission to provide you with exceptional heart care, we have created designated Provider Care Teams.  These Care Teams include your primary Cardiologist (physician) and Advanced Practice Providers (APPs -  Physician Assistants and Nurse Practitioners) who all work together to provide you with the care you need, when you need it.  Your next appointment:    On 12/18/19 (Friday) at 8:15AM with Richardson Dopp, PA-C

## 2019-11-13 ENCOUNTER — Other Ambulatory Visit: Payer: Self-pay | Admitting: Family Medicine

## 2019-11-13 MED ORDER — HYDROCODONE-ACETAMINOPHEN 10-325 MG PO TABS: 1.0000 | ORAL_TABLET | Freq: Three times a day (TID) | ORAL | 0 refills | Status: DC | PRN

## 2019-11-13 NOTE — Telephone Encounter (Signed)
Requested Prescriptions   Pending Prescriptions Disp Refills  . HYDROcodone-acetaminophen (NORCO) 10-325 MG tablet 30 tablet 0    Sig: Take 1 tablet by mouth every 8 (eight) hours as needed.    Last OV 09/18/2019   Last written 10/12/2019

## 2019-11-16 ENCOUNTER — Other Ambulatory Visit (HOSPITAL_COMMUNITY): Payer: Self-pay | Admitting: *Deleted

## 2019-11-16 ENCOUNTER — Telehealth (HOSPITAL_COMMUNITY): Payer: Self-pay

## 2019-11-16 MED ORDER — RIVAROXABAN 20 MG PO TABS: 20.0000 mg | ORAL_TABLET | Freq: Every day | ORAL | 2 refills | Status: DC

## 2019-11-16 NOTE — Telephone Encounter (Signed)
Attempted to speak with the patient. No answer and no availability to leave a message. Will try again later. S>Alicia Bolton EMTP

## 2019-11-19 ENCOUNTER — Encounter (HOSPITAL_COMMUNITY): Payer: Medicare HMO

## 2019-11-24 ENCOUNTER — Other Ambulatory Visit (HOSPITAL_COMMUNITY): Payer: Self-pay | Admitting: *Deleted

## 2019-11-24 MED ORDER — RIVAROXABAN 20 MG PO TABS: 20.0000 mg | ORAL_TABLET | Freq: Every day | ORAL | 2 refills | Status: DC

## 2019-12-03 ENCOUNTER — Telehealth (HOSPITAL_COMMUNITY): Payer: Self-pay | Admitting: *Deleted

## 2019-12-03 NOTE — Telephone Encounter (Signed)
Attempted to call patient regarding upcoming nuclear test- no answer, unable to leave a message.  Alicia Bolton

## 2019-12-07 ENCOUNTER — Telehealth (HOSPITAL_COMMUNITY): Payer: Self-pay | Admitting: *Deleted

## 2019-12-07 ENCOUNTER — Encounter (HOSPITAL_COMMUNITY): Payer: Self-pay | Admitting: *Deleted

## 2019-12-07 NOTE — Telephone Encounter (Signed)
Attempted to call patient regarding upcoming appointment- no answer, unable to leave a message, letter sent via mychart.  Kirstie Peri

## 2019-12-09 ENCOUNTER — Other Ambulatory Visit: Payer: Self-pay

## 2019-12-09 ENCOUNTER — Encounter: Payer: Self-pay | Admitting: Physician Assistant

## 2019-12-09 ENCOUNTER — Ambulatory Visit (HOSPITAL_COMMUNITY): Payer: Medicare HMO | Attending: Cardiology

## 2019-12-09 DIAGNOSIS — I420 Dilated cardiomyopathy: Secondary | ICD-10-CM

## 2019-12-09 HISTORY — DX: Dilated cardiomyopathy: I42.0

## 2019-12-09 LAB — MYOCARDIAL PERFUSION IMAGING
LV dias vol: 75 mL (ref 46–106)
LV sys vol: 40 mL
Peak HR: 100 {beats}/min
Rest HR: 66 {beats}/min
SDS: 1
SRS: 0
SSS: 1
TID: 0.99

## 2019-12-09 MED ORDER — TECHNETIUM TC 99M TETROFOSMIN IV KIT
10.6000 | PACK | Freq: Once | INTRAVENOUS | Status: AC | PRN
  Administered 2019-12-09: 10.6 via INTRAVENOUS
  Filled 2019-12-09: qty 11

## 2019-12-09 MED ORDER — REGADENOSON 0.4 MG/5ML IV SOLN
0.4000 mg | Freq: Once | INTRAVENOUS | Status: AC
  Administered 2019-12-09: 0.4 mg via INTRAVENOUS

## 2019-12-09 MED ORDER — TECHNETIUM TC 99M TETROFOSMIN IV KIT
32.6000 | PACK | Freq: Once | INTRAVENOUS | Status: AC | PRN
  Administered 2019-12-09: 32.6 via INTRAVENOUS
  Filled 2019-12-09: qty 33

## 2019-12-14 ENCOUNTER — Other Ambulatory Visit: Payer: Self-pay | Admitting: Family Medicine

## 2019-12-14 MED ORDER — HYDROCODONE-ACETAMINOPHEN 10-325 MG PO TABS: 1.0000 | ORAL_TABLET | Freq: Three times a day (TID) | ORAL | 0 refills | Status: DC | PRN

## 2019-12-14 NOTE — Telephone Encounter (Signed)
Ok to refill??  Last office visit 09/18/2019.  Last refill 11/13/2019.

## 2019-12-18 ENCOUNTER — Ambulatory Visit: Payer: Medicare HMO | Admitting: Physician Assistant

## 2020-01-10 ENCOUNTER — Other Ambulatory Visit: Payer: Self-pay | Admitting: Family Medicine

## 2020-01-11 MED ORDER — HYDROCODONE-ACETAMINOPHEN 10-325 MG PO TABS: 1.0000 | ORAL_TABLET | Freq: Three times a day (TID) | ORAL | 0 refills | Status: DC | PRN

## 2020-01-11 NOTE — Telephone Encounter (Signed)
Ok to refill??  Last office visit 09/18/2019.  Last refill 12/14/2019.

## 2020-01-19 ENCOUNTER — Encounter: Payer: Self-pay | Admitting: Family Medicine

## 2020-01-19 ENCOUNTER — Other Ambulatory Visit: Payer: Self-pay

## 2020-01-19 ENCOUNTER — Ambulatory Visit (INDEPENDENT_AMBULATORY_CARE_PROVIDER_SITE_OTHER): Payer: Medicare HMO | Admitting: Family Medicine

## 2020-01-19 VITALS — BP 120/82 | HR 88 | Temp 98.7°F | Resp 16 | Ht 65.0 in | Wt 141.0 lb

## 2020-01-19 DIAGNOSIS — M5136 Other intervertebral disc degeneration, lumbar region: Secondary | ICD-10-CM

## 2020-01-19 DIAGNOSIS — I480 Paroxysmal atrial fibrillation: Secondary | ICD-10-CM | POA: Diagnosis not present

## 2020-01-19 DIAGNOSIS — Z1211 Encounter for screening for malignant neoplasm of colon: Secondary | ICD-10-CM

## 2020-01-19 DIAGNOSIS — I10 Essential (primary) hypertension: Secondary | ICD-10-CM | POA: Diagnosis not present

## 2020-01-19 DIAGNOSIS — E782 Mixed hyperlipidemia: Secondary | ICD-10-CM | POA: Diagnosis not present

## 2020-01-19 DIAGNOSIS — Z0000 Encounter for general adult medical examination without abnormal findings: Secondary | ICD-10-CM

## 2020-01-19 DIAGNOSIS — Z0001 Encounter for general adult medical examination with abnormal findings: Secondary | ICD-10-CM | POA: Diagnosis not present

## 2020-01-19 DIAGNOSIS — F411 Generalized anxiety disorder: Secondary | ICD-10-CM | POA: Diagnosis not present

## 2020-01-19 DIAGNOSIS — M51369 Other intervertebral disc degeneration, lumbar region without mention of lumbar back pain or lower extremity pain: Secondary | ICD-10-CM

## 2020-01-19 DIAGNOSIS — M25531 Pain in right wrist: Secondary | ICD-10-CM | POA: Diagnosis not present

## 2020-01-19 DIAGNOSIS — Z1231 Encounter for screening mammogram for malignant neoplasm of breast: Secondary | ICD-10-CM

## 2020-01-19 DIAGNOSIS — I429 Cardiomyopathy, unspecified: Secondary | ICD-10-CM | POA: Diagnosis not present

## 2020-01-19 NOTE — Progress Notes (Signed)
Subjective:   Patient presents for Medicare Annual/Subsequent preventive examination.    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 No Over the past two weeks, have you felt little interest or pleasure in doing things? No Have you lost interest or pleasure in daily life? No Do you often feel hopeless? No Do you cry easily over simple problems? No   Activities of Daily Living  In your present state of health, do you have any difficulty performing the following activities?:  Driving? No  Managing money? No  Feeding yourself? No  Getting from bed to chair? No  Climbing a flight of stairs? Yes  Preparing food and eating?: No  Bathing or showering? No  Getting dressed: No  Getting to the toilet? No  Using the toilet:No  Moving around from place to place: No  In the past year have you fallen or had a near fall?:yes  Are you sexually active? No  Do you have more than one partner? No   Hearing Difficulties: No  Do you often ask people to speak up or repeat themselves? No  Do you experience ringing or noises in your ears? No Do you have difficulty  understanding soft or whispered voices? No  Do you feel that you have a problem with memory? No Do you often misplace items? No  Do you feel safe at home? Yes  Cognitive Testing  Alert? Yes Normal Appearance?Yes  Oriented to person? Yes Place? Yes  Time? Yes  Recall of three objects? Yes  Can perform simple calculations? Yes  Displays appropriate judgment?Yes  Can read the correct time from a watch face?Yes   List the Names of Other Physician/Practitioners you currently use:   Neurology - Dr. Erlinda Hong  Cardiology  GI- Dr. Collene Mares    Screening Tests / Date Colonoscopy    - Due                  Zostavax - Due will defer to covid  Mammogram Due  Influenza Vaccine DECLINES  Tetanus/tdap UTD HIV/HEP C- NEGATIVE  COVID-19 vaccine scheduled for today     ROS: GEN- denies fatigue, fever, weight loss,weakness, recent illness HEENT- denies eye drainage, change in vision, nasal discharge, CVS- denies chest pain, palpitations RESP- denies SOB, cough, wheeze ABD- denies N/V, change in stools, abd pain GU- denies dysuria, hematuria, dribbling, incontinence MSK- + joint pain, muscle aches, injury Neuro- denies headache, dizziness, syncope, seizure activity  Physical: GEN- NAD, alert and oriented x3 HEENT- PERRL, EOMI, non injected sclera, pink conjunctiva,  Neck- Supple, no thryomegaly, no bruit  CVS- RRR, no murmur RESP-CTAB ABD-NABS,soft,NT,ND Psych- normal affect and mood Right wrist, good  ROM, pain with flexion, mild TTP ulnar aspect, mild swelling  EXT- No edema Pulses- Radial, DP- 2+    Assessment:    Annual wellness medicare exam   Plan:    During the course of the visit the patient was educated and counseled about appropriate screening and preventive services including:  FRC/FALL/DEPRESSION SCREEN DONE - no   GAD- xanax refilled  HTN/PAF/cardiomyopathy- per cardiology, bp controlled, recent lipids at goal, continue statin drug   DDD/Chronic pain-  medications refilled  Prevention- referral to GI for colonoscopy  Right wrist pain possible OA vs sprain, no specific injury, topical NSAID, ICE, given wrist brace in office , if not improving with conservative therapy after 2-3 weeks obtain xray   Pt to schedule mammogram   COVID vaccine to be done today   Given handout on living will/advanced directives  Diet review for nutrition referral? Yes ____ Not Indicated __x__  Patient Instructions (the written plan) was given to the patient.  Medicare Attestation  I have personally reviewed:  The patient's medical and social history  Their use of alcohol, tobacco or illicit drugs  Their current medications and supplements  The patient's functional ability including ADLs,fall risks, home safety risks, cognitive, and hearing and visual impairment  Diet and physical activities  Evidence for depression or mood disorders  The patient's weight, height, BMI, and visual acuity have been recorded in the chart. I have made referrals, counseling, and provided education to the patient based on review of the above and I have provided the patient with a written personalized care plan for preventive services.

## 2020-01-19 NOTE — Patient Instructions (Addendum)
F/U 4 months Schedule your mammogram Referral for colonoscopy  We will send you mychart results

## 2020-01-20 LAB — COMPREHENSIVE METABOLIC PANEL
AG Ratio: 1.3 (calc) (ref 1.0–2.5)
ALT: 15 U/L (ref 6–29)
AST: 16 U/L (ref 10–35)
Albumin: 3.9 g/dL (ref 3.6–5.1)
Alkaline phosphatase (APISO): 84 U/L (ref 37–153)
BUN: 13 mg/dL (ref 7–25)
CO2: 29 mmol/L (ref 20–32)
Calcium: 9 mg/dL (ref 8.6–10.4)
Chloride: 103 mmol/L (ref 98–110)
Creat: 0.82 mg/dL (ref 0.50–0.99)
Globulin: 3.1 g/dL (calc) (ref 1.9–3.7)
Glucose, Bld: 90 mg/dL (ref 65–99)
Potassium: 3.4 mmol/L — ABNORMAL LOW (ref 3.5–5.3)
Sodium: 138 mmol/L (ref 135–146)
Total Bilirubin: 0.6 mg/dL (ref 0.2–1.2)
Total Protein: 7 g/dL (ref 6.1–8.1)

## 2020-01-20 LAB — CBC WITH DIFFERENTIAL/PLATELET
Absolute Monocytes: 525 cells/uL (ref 200–950)
Basophils Absolute: 60 cells/uL (ref 0–200)
Basophils Relative: 0.7 %
Eosinophils Absolute: 112 cells/uL (ref 15–500)
Eosinophils Relative: 1.3 %
HCT: 38.9 % (ref 35.0–45.0)
Hemoglobin: 12.9 g/dL (ref 11.7–15.5)
Lymphs Abs: 4094 cells/uL — ABNORMAL HIGH (ref 850–3900)
MCH: 31.5 pg (ref 27.0–33.0)
MCHC: 33.2 g/dL (ref 32.0–36.0)
MCV: 95.1 fL (ref 80.0–100.0)
MPV: 9.3 fL (ref 7.5–12.5)
Monocytes Relative: 6.1 %
Neutro Abs: 3810 cells/uL (ref 1500–7800)
Neutrophils Relative %: 44.3 %
Platelets: 356 10*3/uL (ref 140–400)
RBC: 4.09 10*6/uL (ref 3.80–5.10)
RDW: 13.1 % (ref 11.0–15.0)
Total Lymphocyte: 47.6 %
WBC: 8.6 10*3/uL (ref 3.8–10.8)

## 2020-01-22 ENCOUNTER — Other Ambulatory Visit: Payer: Self-pay | Admitting: *Deleted

## 2020-01-22 MED ORDER — POTASSIUM CHLORIDE CRYS ER 20 MEQ PO TBCR: 20.0000 meq | EXTENDED_RELEASE_TABLET | Freq: Every day | ORAL | 0 refills | Status: DC

## 2020-01-25 ENCOUNTER — Other Ambulatory Visit: Payer: Self-pay | Admitting: Family Medicine

## 2020-01-25 DIAGNOSIS — I10 Essential (primary) hypertension: Secondary | ICD-10-CM

## 2020-01-30 ENCOUNTER — Encounter (HOSPITAL_COMMUNITY): Payer: Self-pay

## 2020-01-30 ENCOUNTER — Ambulatory Visit (HOSPITAL_COMMUNITY)
Admission: EM | Admit: 2020-01-30 | Discharge: 2020-01-30 | Disposition: A | Discharge status: Home / Self Care | Payer: Medicare HMO | Attending: Family Medicine | Admitting: Family Medicine

## 2020-01-30 ENCOUNTER — Other Ambulatory Visit: Payer: Self-pay

## 2020-01-30 ENCOUNTER — Ambulatory Visit (INDEPENDENT_AMBULATORY_CARE_PROVIDER_SITE_OTHER): Payer: Medicare HMO

## 2020-01-30 DIAGNOSIS — R079 Chest pain, unspecified: Secondary | ICD-10-CM

## 2020-01-30 DIAGNOSIS — W19XXXA Unspecified fall, initial encounter: Secondary | ICD-10-CM | POA: Diagnosis not present

## 2020-01-30 DIAGNOSIS — R0781 Pleurodynia: Secondary | ICD-10-CM | POA: Diagnosis not present

## 2020-01-30 DIAGNOSIS — S299XXA Unspecified injury of thorax, initial encounter: Secondary | ICD-10-CM | POA: Diagnosis not present

## 2020-01-30 NOTE — Discharge Instructions (Addendum)
Rest and elevate your hand.  Apply ice packs 2-3 times a day for up to 20 minutes each.    You may take Tylenol as needed for your pain.  Your x-ray does not show any broken ribs, any cardiac or lung issues today.  Follow up with your primary care provider or an orthopedist if you symptoms continue or worsen;  Or if you develop new symptoms, such as numbness, tingling, or weakness.

## 2020-01-30 NOTE — ED Triage Notes (Signed)
Patient had a fall yesterday evening. Tripped on pavers and landed on her left ribs. C/o pain under the left breast. Patient is on blood thinners.

## 2020-01-30 NOTE — ED Provider Notes (Signed)
[place]    CSN: [id number] Arrival date & time: 01/30/20  1000      History   Chief Complaint Chief Complaint  Patient presents with  . Fall    HPI Alicia Bolton is a 64 y.o. female.   Patient reports that she fell last night on a paver on her walkway at home.  Reports that she hit her left ribs and they states that she has been having pain ever since.  States that pain is worse with deep breathing and is radiating under her breast.  Has not taken anything for pain.  Patient is on blood thinner medication, so she has not taken anything for inflammation either.  Patient has extensive medical history including stroke, dilated cardiomyopathy, hypertension.  ROS per HPI  The history is provided by the patient.  Fall    Past Medical History:  Diagnosis Date  . Anxiety    takes Ativan daily as needed  . Arthritis   . Carpal tunnel syndrome 07/17/2013  . Chest mass 04/17/2015  . Chronic migraine 02/08/2016  . DDD (degenerative disc disease), lumbar 04/17/2017  . Dilated cardiomyopathy ([place]) 12/09/2019   EF 45-50 by echocardiogram 09/2019 // Myoview 11/2019: EF 46, no ischemia, mild apical thinning artifact; Low Risk   . DISORDER, TOBACCO USE 06/27/2007   Qualifier: Diagnosis of  By: Jobe Igo MD, Shanon Brow    . Elevated liver function tests 10/04/2014  . Fibromyalgia   . GAD (generalized anxiety disorder) 10/10/2017  . History of bronchitis    few yrs ago  . History of colon polyps    benign  . History of migraine    last one over a month ago  . Hyperlipidemia    takes Atorvastatin daily  . Hypertension    takes Clonidine and HCTZ daily  . Joint pain   . Migraines   . Muscle spasms of neck 06/06/2016  . Neuromuscular disorder ([place])   . Neuropathy 04/25/2016  . Nocturia   . PAF (paroxysmal atrial fibrillation) ([place]) 09/17/2018  . Recurrent boils 04/25/2016  . Spinal stenosis of cervical region 10/25/2014  . Stroke [facility]) [id number]   Embolic Right ACA,  ILR placed  Dr. Rayann Heman.States slight weakness in left  . Tear of medial meniscus of right knee 10/09/2013    Patient Active Problem List   Diagnosis Date Noted  . Dilated cardiomyopathy ([place]) 12/09/2019  . PAF (paroxysmal atrial fibrillation) ([place]) 09/17/2018  . GAD (generalized anxiety disorder) 10/10/2017  . DDD (degenerative disc disease), lumbar 04/17/2017  . Muscle spasms of neck 06/06/2016  . Neuropathy 04/25/2016  . Recurrent boils 04/25/2016  . Hyperlipidemia 02/16/2016  . Chronic migraine 02/08/2016  . HTN (hypertension) 12/08/2015  . Cryptogenic stroke ([place]) 10/02/2015  . Chest mass 04/17/2015  . Spinal stenosis of cervical region 10/25/2014  . Elevated liver function tests 10/04/2014  . Polyarthralgia 09/20/2014  . Hidradenitis suppurativa 07/21/2014  . Tear of medial meniscus of right knee 10/09/2013  . Carpal tunnel syndrome 07/17/2013  . HEADACHE 06/27/2007    Past Surgical History:  Procedure Laterality Date  . ABDOMINAL HYSTERECTOMY  1997  . ANTERIOR CERVICAL DECOMP/DISCECTOMY FUSION N/A 10/25/2014   Procedure: CERVICAL THREE-FOUR, CERVICAL FOUR FIVE ANTERIOR CERVICAL DECOMPRESSION/DISCECTOMY FUSION 2 LEVEL/HARDWARE REMOVALCERVICAL FIVE-SEVEN;  Surgeon: Elaina Hoops, MD;  Location: [place] NEURO ORS;  Service: Neurosurgery;  Laterality: N/A;  . BREAST DUCTAL SYSTEM EXCISION Left 11/14/2016   Procedure: EXCISION DUCTAL SYSTEM LEFT BREAST;  Surgeon: Autumn Messing III, MD;  Location: [facility]  OR;  Service: General;  Laterality: Left;  . BREAST EXCISIONAL BIOPSY Right    benign  . BREAST EXCISIONAL BIOPSY Left    benign  . CARPAL TUNNEL RELEASE Left 10/25/2014   Procedure: CARPAL TUNNEL RELEASE;  Surgeon: Elaina Hoops, MD;  Location: [place] NEURO ORS;  Service: Neurosurgery;  Laterality: Left;  . CARPAL TUNNEL RELEASE Right   . CERVICAL FUSION  2007  . CESAREAN SECTION  1985  . CHEST SURGERY  04/2015   Benign chest mass removed- [place]  . COLONOSCOPY    . EP  IMPLANTABLE DEVICE N/A 10/06/2015   Procedure: Loop Recorder Insertion;  Surgeon: Thompson Grayer, MD;  Location: [place] CV LAB;  Service: Cardiovascular;  Laterality: N/A;  . implantable loop recorder removal  08/10/2019   Medtronic Reveal LINQ removed in office by Dr Rayann Heman  . KNEE ARTHROSCOPY Right 1999  . KNEE ARTHROSCOPY WITH MEDIAL MENISECTOMY Right 10/09/2013   Procedure: RIGHT KNEE ARTHROSCOPY WITH MEDIAL MENISECTOMY;  Surgeon: Johnny Bridge, MD;  Location: [place];  Service: Orthopedics;  Laterality: Right;  clean  . TEE WITHOUT CARDIOVERSION N/A 10/05/2015   Procedure: TRANSESOPHAGEAL ECHOCARDIOGRAM (TEE);  Surgeon: Thayer Headings, MD;  Location: [facility] ENDOSCOPY;  Service: Cardiovascular;  Laterality: N/A;  . thumb surgery Right     OB History    Gravida  2   Para  1   Term  1   Preterm      AB  1   Living  1     SAB      TAB  1   Ectopic      Multiple      Live Births               Home Medications    Prior to Admission medications   Medication Sig Start Date End Date Taking? Authorizing Provider  acetaminophen (TYLENOL) 500 MG tablet Take 500-1,000 mg by mouth every 6 (six) hours as needed for headache (depends on pain).    [provider]  acyclovir (ZOVIRAX) 400 MG tablet TAKE 1 TABLET TWICE DAILY 01/25/20   [place], Modena Nunnery, MD  atorvastatin (LIPITOR) 40 MG tablet Take 1 tablet (40 mg total) by mouth daily. 03/10/19   Alycia Rossetti, MD  cloNIDine (CATAPRES) 0.1 MG tablet TAKE 1 TABLET TWICE DAILY 01/25/20   Alycia Rossetti, MD  clotrimazole (LOTRIMIN) 1 % cream Apply 1 application topically 2 (two) times daily. To face 08/20/17   Alycia Rossetti, MD  hydrochlorothiazide (HYDRODIURIL) 25 MG tablet TAKE 1 TABLET EVERY DAY 01/25/20   Alycia Rossetti, MD  HYDROcodone-acetaminophen [facility]) 10-325 MG tablet Take 1 tablet by mouth every 8 (eight) hours as needed. 01/11/20   [place], Modena Nunnery, MD  LORazepam (ATIVAN) 0.5 MG  tablet Take 1 tablet (0.5 mg total) by mouth 2 (two) times daily as needed. for anxiety 09/17/18   Alycia Rossetti, MD  metoprolol succinate (TOPROL XL) 25 MG 24 hr tablet Take 1 tablet (25 mg total) by mouth daily. 11/04/19   Richardson Dopp T, PA-C  potassium chloride SA (KLOR-CON) 20 MEQ tablet Take 1 tablet (20 mEq total) by mouth daily for 3 days. 01/22/20 01/25/20  Alycia Rossetti, MD  rivaroxaban (XARELTO) 20 MG TABS tablet Take 1 tablet (20 mg total) by mouth daily with supper. 11/24/19   Sherran Needs, NP  tiZANidine (ZANAFLEX) 4 MG tablet Take 1 tablet (4 mg  total) by mouth every 6 (six) hours as needed for muscle spasms. 05/07/18   Alycia Rossetti, MD    Family History Family History  Problem Relation Age of Onset  . Hypertension Mother   . Diabetes Mother   . Cancer Maternal Grandmother        colon  . Stroke Maternal Grandmother     Social History Social History   Tobacco Use  . Smoking status: Former Smoker    Types: Cigarettes    Quit date: 07/07/2014    Years since quitting: 5.5  . Smokeless tobacco: Never Used  . Tobacco comment: may smoke 2 cigarettes per month  Substance Use Topics  . Alcohol use: Yes    Comment: occ  . Drug use: No     Allergies   Azithromycin, Floxin [ofloxacin], and Penicillins   Review of Systems Review of Systems   Physical Exam Triage Vital Signs ED Triage Vitals  Enc Vitals Group     BP 01/30/20 1012 130/88     Pulse Rate 01/30/20 1012 78     Resp 01/30/20 1012 20     Temp 01/30/20 1012 98.5 F (36.9 C)     Temp Source 01/30/20 1012 Oral     SpO2 01/30/20 1012 97 %     Weight --      Height --      Head Circumference --      Peak Flow --      Pain Score 01/30/20 1014 6     Pain Loc --      Pain Edu? --      Excl. in [place]? --    No data found.  Updated Vital Signs BP 130/88 (BP Location: Right Arm)   Pulse 78   Temp 98.5 F (36.9 C) (Oral)   Resp 20   SpO2 97%   Visual Acuity Right Eye Distance:   Left  Eye Distance:   Bilateral Distance:    Right Eye Near:   Left Eye Near:    Bilateral Near:     Physical Exam Vitals and nursing note reviewed.  Constitutional:      General: She is not in acute distress.    Appearance: Normal appearance. She is well-developed and normal weight. She is not ill-appearing.  HENT:     Head: Normocephalic and atraumatic.     Nose: Nose normal.     Mouth/Throat:     Mouth: Mucous membranes are moist.     Pharynx: Oropharynx is clear.  Eyes:     Conjunctiva/sclera: Conjunctivae normal.  Cardiovascular:     Rate and Rhythm: Normal rate and regular rhythm.     Heart sounds: No murmur.  Pulmonary:     Effort: Pulmonary effort is normal. No respiratory distress.     Breath sounds: Normal breath sounds.  Abdominal:     General: Bowel sounds are normal. There is no distension.     Palpations: Abdomen is soft. There is no mass.     Tenderness: There is no abdominal tenderness. There is no right CVA tenderness, left CVA tenderness, guarding or rebound.     Hernia: No hernia is present.  Musculoskeletal:        General: Swelling, tenderness and signs of injury present.     Cervical back: Neck supple.     Comments: Swelling, tenderness to left ribs that radiates to his mid sternum.  Skin:    General: Skin is warm and dry.     Capillary  Refill: Capillary refill takes less than 2 seconds.  Neurological:     General: No focal deficit present.     Mental Status: She is alert and oriented to person, place, and time.  Psychiatric:        Mood and Affect: Mood normal.        Behavior: Behavior normal.        Thought Content: Thought content normal.      UC Treatments / Results  Labs (all labs ordered are listed, but only abnormal results are displayed) Labs Reviewed - No data to display  EKG   Radiology No results found.  Procedures Procedures (including critical care time)  Medications Ordered in UC Medications - No data to display  Initial  Impression / Assessment and Plan / UC Course  I have reviewed the triage vital signs and the nursing notes.  Pertinent labs & imaging results that were available during my care of the patient were reviewed by me and considered in my medical decision making (see chart for details).     Fall/rib pain: Swelling and tenderness to left ribs, under left breast to mid sternum.  Patient had mechanical impact fall where her ribs hit the paver on her walkway at home.  Chest x-ray today shows no pneumothorax, no broken ribs, no bony abnormality.  Of note, does show evidence of sternotomy, this is a new change from her last x-ray.  Patient instructed to rest, that this will take time to heal.  Patient seems relieved at the news of no broken bones.  Patient instructed to follow-up here with primary care if symptoms or not improving or getting worse.  Patient instructed to follow-up in the ER for trouble swallowing, shortness of breath, other concerning symptoms. Final Clinical Impressions(s) / UC Diagnoses   Final diagnoses:  Fall, initial encounter  Rib pain on left side     Discharge Instructions     Rest and elevate your hand.  Apply ice packs 2-3 times a day for up to 20 minutes each.    You may take Tylenol as needed for your pain.  Your x-ray does not show any broken ribs, any cardiac or lung issues today.  Follow up with your primary care provider or an orthopedist if you symptoms continue or worsen;  Or if you develop new symptoms, such as numbness, tingling, or weakness.       ED Prescriptions    None     PDMP not reviewed this encounter.   Faustino Congress, NP 02/02/20 [phone]

## 2020-02-04 ENCOUNTER — Encounter: Payer: Self-pay | Admitting: *Deleted

## 2020-02-11 ENCOUNTER — Other Ambulatory Visit: Payer: Self-pay | Admitting: Family Medicine

## 2020-02-11 ENCOUNTER — Encounter: Payer: Self-pay | Admitting: Family Medicine

## 2020-02-11 NOTE — Telephone Encounter (Signed)
Ok to refill??  Last office visit 01/19/2020.  Last refill 01/11/2020.

## 2020-02-12 MED ORDER — HYDROCODONE-ACETAMINOPHEN 10-325 MG PO TABS: 1.0000 | ORAL_TABLET | Freq: Three times a day (TID) | ORAL | 0 refills | Status: DC | PRN

## 2020-02-13 ENCOUNTER — Other Ambulatory Visit: Payer: Self-pay | Admitting: Family Medicine

## 2020-02-19 ENCOUNTER — Telehealth: Payer: Self-pay | Admitting: *Deleted

## 2020-02-19 NOTE — Telephone Encounter (Signed)
I will need to call [facility] on Monday as they are currently closed now and will re-open Monday morning. Need missing information, fax #, anesthesia to be used if any, name of MD doing procedure, date of procedure.

## 2020-02-22 NOTE — Telephone Encounter (Signed)
   [facility] Pre-operative Risk Assessment    Request for surgical clearance:  1. What type of surgery is being performed? 6 TEETH TO BE EXTRACTED, DENTURE PLATE TO BE PLACED  2. When is this surgery scheduled? TBD   3. What type of clearance is required (medical clearance vs. Pharmacy clearance to hold med vs. Both)? BOTH  4. Are there any medications that need to be held prior to surgery and how long? [place]   5. Practice name and name of physician performing surgery? [facility]; [place], [place]   6. What is your office phone number [phone]   7.   What is your office fax number [phone]  8.   Anesthesia type (None, local, MAC, general) ? LOCAL   Julaine Hua 02/22/2020, 9:11 AM  _________________________________________________________________   (provider comments below)

## 2020-02-24 NOTE — Telephone Encounter (Signed)
Patient with diagnosis of afib on Xarelto for anticoagulation.    Procedure: 6 TEETH TO BE EXTRACTED, DENTURE PLATE TO BE PLACED Date of procedure: TBD  CHADS2-VASc score of  5 (CHF, HTN, stroke/tia x 2, female)  CrCl 64 ml/min  Per office protocol, patient can hold Xarelto for 1 day prior to procedure.

## 2020-02-24 NOTE — Telephone Encounter (Signed)
   Primary Cardiologist: Thompson Grayer, MD  Chart reviewed as part of pre-operative protocol coverage. Patient was contacted 02/24/2020 in reference to pre-operative risk assessment for pending surgery as outlined below.  DEZZIE AGRAMONTE was last seen on 1/21 by Richardson Dopp.  Since that day, LAVOLA CUTCHER has done very well from a cardiac standpoint. No concerning anginal symptoms or dyspnea. No issues with bleeding while on Xarelto.  Therefore, based on ACC/AHA guidelines, the patient would be at acceptable risk for the planned procedure without further cardiovascular testing.   Per Pharmacy recommendations, will need to hold Xarelto 1 day prior to procedure.   I will route this recommendation to the requesting party via Epic fax function and remove from pre-op pool.  Please call with questions.  Reino Bellis, NP 02/24/2020, 11:13 AM

## 2020-03-07 ENCOUNTER — Telehealth: Payer: Self-pay | Admitting: Family Medicine

## 2020-03-07 NOTE — Chronic Care Management (AMB) (Signed)
°  Chronic Care Management   Outreach Note  03/07/2020 Name: Alicia Bolton MRN: [id number] DOB: May 10, 1956  Referred by: Alycia Rossetti, MD Reason for referral : Chronic Care Management   An unsuccessful telephone outreach was attempted today. The patient was referred to the pharmacist for assistance with care management and care coordination.   Follow Up Plan:   [place]

## 2020-03-10 ENCOUNTER — Encounter: Payer: Self-pay | Admitting: Family Medicine

## 2020-03-10 MED ORDER — HYDROCODONE-ACETAMINOPHEN 10-325 MG PO TABS: 1.0000 | ORAL_TABLET | Freq: Three times a day (TID) | ORAL | 0 refills | Status: DC | PRN

## 2020-03-10 NOTE — Telephone Encounter (Signed)
Ok to refill??  Last office visit 01/19/2020.  Last refill 02/12/2020.

## 2020-03-16 DIAGNOSIS — R69 Illness, unspecified: Secondary | ICD-10-CM | POA: Diagnosis not present

## 2020-03-30 ENCOUNTER — Encounter: Payer: Self-pay | Admitting: Family Medicine

## 2020-03-30 MED ORDER — CLOTRIMAZOLE 1 % EX CREA: 1.0000 "application " | TOPICAL_CREAM | Freq: Two times a day (BID) | CUTANEOUS | 1 refills | Status: AC

## 2020-03-30 NOTE — Telephone Encounter (Signed)
Lotrimin refilled.

## 2020-04-06 ENCOUNTER — Telehealth: Payer: Self-pay | Admitting: Family Medicine

## 2020-04-06 NOTE — Progress Notes (Signed)
  Chronic Care Management   Outreach Note  04/06/2020 Name: Alicia Bolton MRN: [id number] DOB: August 31, 1956  Referred by: Alycia Rossetti, MD Reason for referral : No chief complaint on file.   A second unsuccessful telephone outreach was attempted today. The patient was referred to pharmacist for assistance with care management and care coordination.  Follow Up Plan:   [place]

## 2020-04-11 ENCOUNTER — Other Ambulatory Visit: Payer: Self-pay | Admitting: Family Medicine

## 2020-04-11 MED ORDER — HYDROCODONE-ACETAMINOPHEN 10-325 MG PO TABS: 1.0000 | ORAL_TABLET | Freq: Three times a day (TID) | ORAL | 0 refills | Status: DC | PRN

## 2020-04-11 NOTE — Telephone Encounter (Signed)
Ok to refill??  Last office visit 01/19/2020.  Last refill 03/10/2020.

## 2020-04-20 ENCOUNTER — Encounter: Payer: Self-pay | Admitting: Family Medicine

## 2020-05-10 ENCOUNTER — Encounter: Payer: Self-pay | Admitting: Family Medicine

## 2020-05-11 MED ORDER — HYDROCODONE-ACETAMINOPHEN 10-325 MG PO TABS: 1.0000 | ORAL_TABLET | Freq: Three times a day (TID) | ORAL | 0 refills | Status: DC | PRN

## 2020-05-11 NOTE — Telephone Encounter (Signed)
Ok to refill??  Last office visit 01/19/2020.  Last refill 04/11/2020.

## 2020-06-12 ENCOUNTER — Other Ambulatory Visit: Payer: Self-pay | Admitting: Family Medicine

## 2020-06-13 ENCOUNTER — Telehealth: Payer: Self-pay | Admitting: Family Medicine

## 2020-06-13 NOTE — Telephone Encounter (Signed)
CB# [phone] Pt need referral to see a  Gastroenterology

## 2020-06-14 MED ORDER — HYDROCODONE-ACETAMINOPHEN 10-325 MG PO TABS: 1.0000 | ORAL_TABLET | Freq: Three times a day (TID) | ORAL | 0 refills | Status: DC | PRN

## 2020-06-14 NOTE — Telephone Encounter (Signed)
Requested Prescriptions   Pending Prescriptions Disp Refills   HYDROcodone-acetaminophen (NORCO) 10-325 MG tablet 30 tablet 0    Sig: Take 1 tablet by mouth every 8 (eight) hours as needed.     Last OV 01/19/2020    Last written 05/11/2020

## 2020-06-15 NOTE — Telephone Encounter (Signed)
Pt notified to give them a call to request appt. Referral was placed in Mar 2021

## 2020-06-19 ENCOUNTER — Encounter: Payer: Self-pay | Admitting: Family Medicine

## 2020-06-24 ENCOUNTER — Ambulatory Visit (INDEPENDENT_AMBULATORY_CARE_PROVIDER_SITE_OTHER): Payer: Medicare HMO | Admitting: Family Medicine

## 2020-06-24 ENCOUNTER — Other Ambulatory Visit: Payer: Self-pay

## 2020-06-24 ENCOUNTER — Encounter: Payer: Self-pay | Admitting: Family Medicine

## 2020-06-24 VITALS — BP 130/66 | HR 90 | Temp 98.1°F | Resp 14 | Ht 65.0 in | Wt 133.0 lb

## 2020-06-24 DIAGNOSIS — T148XXA Other injury of unspecified body region, initial encounter: Secondary | ICD-10-CM

## 2020-06-24 DIAGNOSIS — I10 Essential (primary) hypertension: Secondary | ICD-10-CM | POA: Diagnosis not present

## 2020-06-24 DIAGNOSIS — L02411 Cutaneous abscess of right axilla: Secondary | ICD-10-CM | POA: Diagnosis not present

## 2020-06-24 DIAGNOSIS — M5136 Other intervertebral disc degeneration, lumbar region: Secondary | ICD-10-CM | POA: Diagnosis not present

## 2020-06-24 DIAGNOSIS — I480 Paroxysmal atrial fibrillation: Secondary | ICD-10-CM | POA: Diagnosis not present

## 2020-06-24 DIAGNOSIS — S80922A Unspecified superficial injury of left lower leg, initial encounter: Secondary | ICD-10-CM | POA: Diagnosis not present

## 2020-06-24 DIAGNOSIS — M4802 Spinal stenosis, cervical region: Secondary | ICD-10-CM | POA: Diagnosis not present

## 2020-06-24 MED ORDER — SULFAMETHOXAZOLE-TRIMETHOPRIM 800-160 MG PO TABS: 1.0000 | ORAL_TABLET | Freq: Two times a day (BID) | ORAL | 0 refills | Status: DC

## 2020-06-24 MED ORDER — FLUCONAZOLE 150 MG PO TABS: 150.0000 mg | ORAL_TABLET | Freq: Once | ORAL | 0 refills | Status: AC

## 2020-06-24 NOTE — Assessment & Plan Note (Signed)
Chronic norco and prn muscle relaxer for back and neck

## 2020-06-24 NOTE — Progress Notes (Signed)
   Subjective:    Patient ID: Alicia Bolton, female    DOB: September 24, 1956, 64 y.o.   MRN: [id number]  Patient presents for Brusing to LLE (x days- states that she had hard spot that now has moved, but has discoloration down her shin)   PAF/ history of Cryptogenic stroke- loop recorder is out, no chest pain,no palpitations, no SOB , continued on xarelto but needs some assistance due to cost, BP has been controlled   Hyperlipidemia- on lipitor    dilated cardiomyopathy- on Toprol 25mg  daily, stress test was normal , continued on clonidine, HCTZ   Had knot on her left lower leg last week, not sure if she it something, non tender, the next day knot went down and there was bruising on leg, the bruised area spread down some, now fading, but just wanted to check it  Has recurrent boil in right axilla started yesterday, no drainage, no fever, no chills   Chronic back/neck pain- continues on pain meds as needed     Review Of Systems:  GEN- denies fatigue, fever, weight loss,weakness, recent illness HEENT- denies eye drainage, change in vision, nasal discharge, CVS- denies chest pain, palpitations RESP- denies SOB, cough, wheeze ABD- denies N/V, change in stools, abd pain GU- denies dysuria, hematuria, dribbling, incontinence MSK-+joint pain, muscle aches, injury Neuro- denies headache, dizziness, syncope, seizure activity       Objective:    BP 130/66   Pulse 90   Temp 98.1 F (36.7 C) (Temporal)   Resp 14   Ht 5\' 5"  (1.651 m)   Wt 133 lb (60.3 kg)   SpO2 96%   BMI 22.13 kg/m  GEN- NAD, alert and oriented x3 HEENT- PERRL, EOMI, non injected sclera, pink conjunctiva, MMM, oropharynx clear Neck- Supple, no bruit  CVS- RRR, no murmur RESP-CTAB ABD-NABS,soft,NT,ND Skin- Right axilla- small abcess indurated but minimal fluctance, hair follicles at center, mild TTP  EXT- No edema , left leg superficial veins noted on thin legs- small area of bruising 1-2 inches long, NT, no  nodule palpated, no erythema , neg homans  Pulses- Radial, DP- 2+        Assessment & Plan:      Problem List Items Addressed This Visit      Unprioritized   DDD (degenerative disc disease), lumbar    Chronic norco and prn muscle relaxer for back and neck       HTN (hypertension)    Controlled no changes       PAF (paroxysmal atrial fibrillation) (HCC)    No recurrent episodes BP controlled Continue xarelto Patient assistance forms given to her to complete  Check labs next visit  I beleive she hit a vein, that caused the temporary swelling and now bruised area, now resolving, with her being on xarelto she will bruise easier, no sign of infection or persistent superficial clot       Spinal stenosis of cervical region    Other Visit Diagnoses    Abscess of axilla, right    -  Primary   recurrent for pt, antibaterial soap, warm compress, bactrim given, I&D if it doesnt resolve    Bruise of left leg           Note: This dictation was prepared with Dragon dictation along with smaller phrase technology. Any transcriptional errors that result from this process are unintentional.

## 2020-06-24 NOTE — Assessment & Plan Note (Signed)
No recurrent episodes BP controlled Continue xarelto Patient assistance forms given to her to complete  Check labs next visit  I beleive she hit a vein, that caused the temporary swelling and now bruised area, now resolving, with her being on xarelto she will bruise easier, no sign of infection or persistent superficial clot

## 2020-06-24 NOTE — Patient Instructions (Addendum)
F/U 4 months - fasting

## 2020-06-24 NOTE — Assessment & Plan Note (Signed)
Controlled no changes 

## 2020-07-11 ENCOUNTER — Other Ambulatory Visit: Payer: Self-pay | Admitting: Family Medicine

## 2020-07-12 MED ORDER — HYDROCODONE-ACETAMINOPHEN 10-325 MG PO TABS
1.0000 | ORAL_TABLET | Freq: Three times a day (TID) | ORAL | 0 refills | Status: DC | PRN
Start: 2020-07-12 — End: 2020-08-10

## 2020-07-12 NOTE — Telephone Encounter (Signed)
Requested Prescriptions   Pending Prescriptions Disp Refills  . HYDROcodone-acetaminophen (NORCO) 10-325 MG tablet 30 tablet 0    Sig: Take 1 tablet by mouth every 8 (eight) hours as needed.     Last OV 06/24/2020   Last written 06/14/2020

## 2020-08-02 ENCOUNTER — Ambulatory Visit: Payer: Medicare HMO

## 2020-08-08 ENCOUNTER — Encounter: Payer: Self-pay | Admitting: Family Medicine

## 2020-08-09 ENCOUNTER — Ambulatory Visit: Payer: Medicare HMO | Admitting: Family Medicine

## 2020-08-10 ENCOUNTER — Ambulatory Visit (INDEPENDENT_AMBULATORY_CARE_PROVIDER_SITE_OTHER): Payer: Medicare HMO | Admitting: Family Medicine

## 2020-08-10 ENCOUNTER — Encounter: Payer: Self-pay | Admitting: Family Medicine

## 2020-08-10 ENCOUNTER — Other Ambulatory Visit: Payer: Self-pay

## 2020-08-10 VITALS — BP 128/84 | HR 98 | Temp 98.0°F | Resp 10 | Ht 66.0 in | Wt 132.6 lb

## 2020-08-10 DIAGNOSIS — Z23 Encounter for immunization: Secondary | ICD-10-CM | POA: Diagnosis not present

## 2020-08-10 DIAGNOSIS — Z91038 Other insect allergy status: Secondary | ICD-10-CM | POA: Diagnosis not present

## 2020-08-10 MED ORDER — HYDROCODONE-ACETAMINOPHEN 10-325 MG PO TABS
1.0000 | ORAL_TABLET | Freq: Three times a day (TID) | ORAL | 0 refills | Status: DC | PRN
Start: 2020-08-10 — End: 2020-09-12

## 2020-08-10 MED ORDER — TRIAMCINOLONE ACETONIDE 0.1 % EX CREA: 1.0000 "application " | TOPICAL_CREAM | Freq: Two times a day (BID) | CUTANEOUS | 1 refills | Status: AC

## 2020-08-10 NOTE — Patient Instructions (Signed)
F/U 3 months  Use the steroid cream as needed FLu shot given

## 2020-08-10 NOTE — Progress Notes (Signed)
,    Subjective:    Patient ID: Alicia Bolton, female    DOB: 26-Sep-1956, 64 y.o.   MRN: [id number]  Patient presents for Insect Bite (Pt said that she had several bites on her right leg recently. Sent pictures earlier on my chart. She said that the areas look much better now but here to be checked. Never felt bad from the bites They just itched )   Pt here with multiple insect bites over the weekend . She had pictures of multiple localized reactions to bites.  Somewhat papular there are couple that had a blistered appearance.  She used topical Benadryl spray and she also took Benadryl and the intense itching improved and now they are all flat.  She was sitting outside when the bites occurred.  She want to know if there is something else topically she could use.  She also use Epson salt soaks which help take out some of the inflammation.  She does try to use both spray when she goes outside.  She requests flu shot  Requests refill on pain medication  Review Of Systems:  GEN- denies fatigue, fever, weight loss,weakness, recent illness HEENT- denies eye drainage, change in vision, nasal discharge  CVS- denies chest pain, palpitations RESP- denies SOB, cough, wheeze ABD- denies N/V, change in stools, abd pain GU- denies dysuria, hematuria, dribbling, incontinence MSK- +joint pain, muscle aches, injury Neuro- denies headache, dizziness, syncope, seizure activity        Objective:    BP 128/84   Pulse 98   Temp 98 F (36.7 C)   Resp 10   Ht 5\' 6"  (1.676 m)   Wt 132 lb 9.6 oz (60.1 kg)   SpO2 99%   BMI 21.40 kg/m  GEN- NAD, alert and oriented x3 CVS- RRR, no murmur RESP-CTAB Skin in tact, healed scabs of previous bites on  bilat LE, right knee, left foot,ankle , no erythema, no swelling  EXT- No edema Pulses- Radial, DP- 2+        Assessment & Plan:      Problem List Items Addressed This Visit    None    Visit Diagnoses    Allergic to insect bites    -  Primary    Given Triamcinolone cream to use prn, try [place], stronger bug spray   Need for immunization against influenza       Flu shot given, pt monitored as first time    Relevant Orders   Flu Vaccine QUAD 36+ mos IM (Completed)      Note: This dictation was prepared with Dragon dictation along with smaller phrase technology. Any transcriptional errors that result from this process are unintentional.

## 2020-08-14 ENCOUNTER — Encounter: Payer: Self-pay | Admitting: Family Medicine

## 2020-08-19 ENCOUNTER — Other Ambulatory Visit: Payer: Self-pay

## 2020-08-19 ENCOUNTER — Encounter: Payer: Self-pay | Admitting: Family Medicine

## 2020-08-19 ENCOUNTER — Ambulatory Visit (INDEPENDENT_AMBULATORY_CARE_PROVIDER_SITE_OTHER): Payer: Medicare HMO | Admitting: Family Medicine

## 2020-08-19 VITALS — BP 118/60 | HR 93 | Temp 98.6°F | Ht 66.0 in | Wt 133.2 lb

## 2020-08-19 DIAGNOSIS — R599 Enlarged lymph nodes, unspecified: Secondary | ICD-10-CM

## 2020-08-19 NOTE — Progress Notes (Signed)
   Subjective:    Patient ID: Alicia Bolton, female    DOB: 05-29-1956, 64 y.o.   MRN: [id number]  Patient presents for Follow-up (Lump on the Lt collar blade)  Patient here with 2 lumps above her right collarbone.  He thought that on Sunday she had her flu shot done on October 13.  They are not tender.  Initially had more swelling when she is in a picture from the weekend but is already gone down.  She has not applied anything topically.  She has not noted any lumps in the breast or the axilla.  No fever or chills associated.   Review Of Systems:  GEN- denies fatigue, fever, weight loss,weakness, recent illness HEENT- denies eye drainage, change in vision, nasal discharge, CVS- denies chest pain, palpitations RESP- denies SOB, cough, wheeze ABD- denies N/V, change in stools, abd pain GU- denies dysuria, hematuria, dribbling, incontinence MSK- denies joint pain, muscle aches, injury Neuro- denies headache, dizziness, syncope, seizure activity       Objective:    BP 118/60 (BP Location: Right Arm, Patient Position: Sitting, Cuff Size: Normal)   Pulse 93   Temp 98.6 F (37 C) (Oral)   Ht 5\' 6"  (1.676 m)   Wt 133 lb 3.2 oz (60.4 kg)   SpO2 97%   BMI 21.50 kg/m  GEN- NAD, alert and oriented x3 HEENT- PERRL, EOMI, non injected sclera, pink conjunctiva, MMM, oropharynx clear Neck- Supple, no thyromegaly, 2 SMALL shotty lymph nodes supraclavicular, NT, no erythema, No axillary nodes  CVS- RRR, no murmur RESP-CTAB        Assessment & Plan:      Problem List Items Addressed This Visit    None    Visit Diagnoses    Reactive lymphadenopathy    -  Primary   shotty reactive nodes, likely due to flu shot given on that side, they are already decreasing in size. Will give another week, if they become painful or enlarge will obtain US       Note: This dictation was prepared with Dragon dictation along with smaller phrase technology. Any transcriptional errors that result from  this process are unintentional.

## 2020-08-19 NOTE — Patient Instructions (Addendum)
Call if not improved in 1 week and ultrasound will be done F/U AS NEEDED

## 2020-08-25 ENCOUNTER — Encounter: Payer: Self-pay | Admitting: Gastroenterology

## 2020-09-02 ENCOUNTER — Other Ambulatory Visit: Payer: Self-pay

## 2020-09-02 ENCOUNTER — Encounter: Payer: Self-pay | Admitting: Family Medicine

## 2020-09-02 ENCOUNTER — Ambulatory Visit (INDEPENDENT_AMBULATORY_CARE_PROVIDER_SITE_OTHER): Payer: Medicare HMO | Admitting: Family Medicine

## 2020-09-02 VITALS — BP 138/70 | HR 100 | Temp 98.8°F | Resp 14 | Ht 66.0 in | Wt 132.0 lb

## 2020-09-02 DIAGNOSIS — R2990 Unspecified symptoms and signs involving the nervous system: Secondary | ICD-10-CM | POA: Diagnosis not present

## 2020-09-02 NOTE — Patient Instructions (Signed)
F/U as previous 

## 2020-09-02 NOTE — Progress Notes (Signed)
   Subjective:    Patient ID: Alicia Bolton, female    DOB: [birth year], 63 y.o.   MRN: [id number]  Patient presents for Other (possible stroke like sx- Wed- hx TIA)   Pt here after an "episode" on Wed  she saw fluttering in her vision, she gets dizziness and heart reports she has a migraine but she never developed a headache.  This was early in the morning then later in the evening when she was out with her sister shopping she  couldn't recommend one of her grandchildrens names which is bothering her significantly. Her body just felt strange and this lasted for about 10 minutes , felt out of body and weak She didn't want to go to ER but was concerned she was having another stroke No chest pain, no SOB   Blood pressure has been good She is taking the xarelto regulary  Back to herself.  Review Of Systems:  GEN- denies fatigue, fever, weight loss,weakness, recent illness HEENT- denies eye drainage, change in vision, nasal discharge, CVS- denies chest pain, palpitations RESP- denies SOB, cough, wheeze ABD- denies N/V, change in stools, abd pain GU- denies dysuria, hematuria, dribbling, incontinence MSK- denies joint pain, muscle aches, injury Neuro- denies headache, dizziness, syncope, seizure activity       Objective:    BP 138/70   Pulse 100   Temp 98.8 F (37.1 C) (Temporal)   Resp 14   Ht 5\' 6"  (1.676 m)   Wt 132 lb (59.9 kg)   SpO2 99%   BMI 21.31 kg/m  GEN- NAD, alert and oriented x3 HEENT- PERRL, EOMI, non injected sclera, pink conjunctiva, MMM, oropharynx clear Neck- Supple, no thyromegaly , no carotid bruit  CVS- RRR, no murmur RESP-CTAB NEURO-CNII-XII in tact, no focal deficit, normal finger to nose, neg rhomberg, normal speech  MSK- Moving all 4 ext equally at baseline  EXT- No edema Pulses- Radial,  2+        Assessment & Plan:      Problem List Items Addressed This Visit    None    Visit Diagnoses    Stroke-like symptom    -  Primary   TIA  like symptoms, now resolved, BP controlled on xarelto, unclear cause, advised if it occurs again needs to go to ER, she has had TIA/CVA in past , exam today baseline No change in meds       Note: This dictation was prepared with Dragon dictation along with smaller phrase technology. Any transcriptional errors that result from this process are unintentional.

## 2020-09-12 ENCOUNTER — Encounter: Payer: Self-pay | Admitting: Family Medicine

## 2020-09-12 MED ORDER — HYDROCODONE-ACETAMINOPHEN 10-325 MG PO TABS
1.0000 | ORAL_TABLET | Freq: Three times a day (TID) | ORAL | 0 refills | Status: DC | PRN
Start: 2020-09-12 — End: 2020-10-11

## 2020-09-12 NOTE — Telephone Encounter (Signed)
Ok to refill??  Last office visit 09/02/2020.  Last refill 08/10/2020.

## 2020-09-21 ENCOUNTER — Other Ambulatory Visit: Payer: Self-pay | Admitting: *Deleted

## 2020-09-21 MED ORDER — ACYCLOVIR 400 MG PO TABS
400.0000 mg | ORAL_TABLET | Freq: Two times a day (BID) | ORAL | 3 refills | Status: DC
Start: 2020-09-21 — End: 2020-12-29

## 2020-09-21 MED ORDER — HYDROCHLOROTHIAZIDE 25 MG PO TABS
25.0000 mg | ORAL_TABLET | Freq: Every day | ORAL | 3 refills | Status: DC
Start: 2020-09-21 — End: 2020-12-29

## 2020-09-21 MED ORDER — ATORVASTATIN CALCIUM 40 MG PO TABS
40.0000 mg | ORAL_TABLET | Freq: Every day | ORAL | 3 refills | Status: DC
Start: 2020-09-21 — End: 2020-12-29

## 2020-09-28 ENCOUNTER — Encounter: Payer: Self-pay | Admitting: Family Medicine

## 2020-10-03 ENCOUNTER — Ambulatory Visit: Payer: Medicare HMO | Admitting: Internal Medicine

## 2020-10-05 ENCOUNTER — Ambulatory Visit
Admission: RE | Admit: 2020-10-05 | Discharge: 2020-10-05 | Disposition: A | Discharge status: Home / Self Care | Payer: Medicare HMO | Source: Ambulatory Visit | Attending: Family Medicine | Admitting: Family Medicine

## 2020-10-05 ENCOUNTER — Other Ambulatory Visit: Payer: Self-pay

## 2020-10-05 DIAGNOSIS — Z1231 Encounter for screening mammogram for malignant neoplasm of breast: Secondary | ICD-10-CM | POA: Diagnosis not present

## 2020-10-11 ENCOUNTER — Other Ambulatory Visit: Payer: Self-pay | Admitting: Family Medicine

## 2020-10-11 MED ORDER — HYDROCODONE-ACETAMINOPHEN 10-325 MG PO TABS: 1.0000 | ORAL_TABLET | Freq: Three times a day (TID) | ORAL | 0 refills | Status: DC | PRN

## 2020-10-11 NOTE — Telephone Encounter (Signed)
Ok to refill??  Last office visit 09/02/2020.  Last refill 09/12/2020.

## 2020-10-13 ENCOUNTER — Encounter: Payer: Self-pay | Admitting: Gastroenterology

## 2020-10-13 ENCOUNTER — Telehealth: Payer: Self-pay

## 2020-10-13 ENCOUNTER — Ambulatory Visit (INDEPENDENT_AMBULATORY_CARE_PROVIDER_SITE_OTHER): Payer: Medicare HMO | Admitting: Gastroenterology

## 2020-10-13 VITALS — BP 120/72 | HR 87 | Ht 66.0 in | Wt 136.0 lb

## 2020-10-13 DIAGNOSIS — Z1211 Encounter for screening for malignant neoplasm of colon: Secondary | ICD-10-CM | POA: Diagnosis not present

## 2020-10-13 MED ORDER — PLENVU 140 G PO SOLR: 1.0000 | ORAL | 0 refills | Status: DC

## 2020-10-13 NOTE — Telephone Encounter (Signed)
Patient with diagnosis of PAF on Xarelto for anticoagulation.    Procedure: colonoscopy Date of procedure: 12/06/20   CHA2DS2-VASc Score = 4  This indicates a 4.8% annual risk of stroke. The patient's score is based upon: CHF History: No (Last LVEF 45-50 09/2019) HTN History: Yes Diabetes History: No Stroke History: Yes Vascular Disease History: No Age Score: 0 Gender Score: 1   CrCl 68 mL/min Platelet count 256K  GI requesting 2 day hold.  Due to patient's history of stroke, will route to cardiologist for input.

## 2020-10-13 NOTE — Telephone Encounter (Signed)
[facility] Pre-operative Risk Assessment     Request for surgical clearance:     Endoscopy Procedure  What type of surgery is being performed?     Colonoscopy  When is this surgery scheduled?     12/06/20  What type of clearance is required ?   Pharmacy  Are there any medications that need to be held prior to surgery and how long? Xarelto x 2 days  Practice name and name of physician performing surgery?      [facility]  What is your office phone and fax number?      Phone- [phone]  [phone]  Anesthesia type (None, local, MAC, general) ?       MAC

## 2020-10-13 NOTE — Progress Notes (Signed)
Referring Provider: Alycia Rossetti, MD Primary Care Physician:  Alycia Rossetti, MD  Reason for Consultation:  Need for colonoscopy   IMPRESSION:  History of colon polyps    - 1-2 tubular adenomas on colonoscopy with Dr. Collene Mares 2011 Need for surveillance colonoscopy Chronic Xarelto use  I have recommended holding Xarelto for 2 days before surveillance colonoscopy.  I discussed with the patient that there is a low, but real, risk of a cardiovascular event such as heart attack, stroke, or embolism/thrombosis while off Xarelto. Will communicate by EMR with patient's prescribing cardiologist to confirm that holding the Xarelto is appropriate at this time.     PLAN: Colonoscopy after a Xarelto washout if approved by her prescribing cardiologist   Please see the "Patient Instructions" section for addition details about the plan.  HPI: Alicia Bolton is a 64 y.o. female referred by Dr. Buelah Manis for surveillance colonoscopy.  The history is obtained through the patient and review of her electronic health record.  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  Echocardiogram 09/2019 showed an EF of 45 to 50%.  Colonoscopy with Dr. Collene Mares 08/11/2010 for a family history of colon cancer in her maternal grandmother revealed a small cecal polyp and a small left-sided polyp.  A lipoma was noted in the right colon.  The exam was otherwise normal to the terminal ileum.  Pathology showed a tubular adenoma.  Handwritten notes on the pathology report mention a surveillance colonoscopy recommended in 5 years.  She is ready to proceed with surveillance colonoscopy at this time.  GI review of systems is negative.  Maternal grandmother with colon polyps and possibly colon cancer.  No other known family history of colon cancer or polyps. No family history of  uterine/endometrial cancer, pancreatic cancer or gastric/stomach cancer.   Past Medical History:  Diagnosis Date  . Anxiety    takes Ativan daily as needed  . Arthritis   . Carpal tunnel syndrome 07/17/2013  . Chest mass 04/17/2015  . Chronic migraine 02/08/2016  . DDD (degenerative disc disease), lumbar 04/17/2017  . Dilated cardiomyopathy ([place]) 12/09/2019   EF 45-50 by echocardiogram 09/2019 // Myoview 11/2019: EF 46, no ischemia, mild apical thinning artifact; Low Risk   . DISORDER, TOBACCO USE 06/27/2007   Qualifier: Diagnosis of  By: Jobe Igo MD, Shanon Brow    . Elevated liver function tests 10/04/2014  . Fibromyalgia   . GAD (generalized anxiety disorder) 10/10/2017  . History of bronchitis    few yrs ago  . History of colon polyps    benign  . History of migraine    last one over a month ago  . Hyperlipidemia    takes Atorvastatin daily  . Hypertension    takes Clonidine and HCTZ daily  . Joint pain   . Migraines   . Muscle spasms of neck 06/06/2016  . Neuromuscular disorder ([place])   . Neuropathy 04/25/2016  . Nocturia   . PAF (paroxysmal atrial fibrillation) ([place]) 09/17/2018  . Recurrent boils 04/25/2016  . Spinal stenosis of cervical region 10/25/2014  . Stroke [facility]) 10/7492   Embolic Right ACA,  ILR placed Dr. Rayann Heman.States slight weakness in left  . Tear of medial meniscus of right knee 10/09/2013    Past Surgical History:  Procedure Laterality Date  . ABDOMINAL HYSTERECTOMY  1997  . ANTERIOR CERVICAL DECOMP/DISCECTOMY FUSION N/A 10/25/2014   Procedure: CERVICAL  THREE-FOUR, CERVICAL FOUR FIVE ANTERIOR CERVICAL DECOMPRESSION/DISCECTOMY FUSION 2 LEVEL/HARDWARE REMOVALCERVICAL FIVE-SEVEN;  Surgeon: Elaina Hoops, MD;  Location: [place] NEURO ORS;  Service: Neurosurgery;  Laterality: N/A;  . BREAST DUCTAL SYSTEM EXCISION Left 11/14/2016   Procedure: EXCISION DUCTAL SYSTEM LEFT BREAST;  Surgeon: Autumn Messing III, MD;  Location: [place];  Service: General;  Laterality: Left;  . BREAST  EXCISIONAL BIOPSY Right    benign  . BREAST EXCISIONAL BIOPSY Left    benign  . CARPAL TUNNEL RELEASE Left 10/25/2014   Procedure: CARPAL TUNNEL RELEASE;  Surgeon: Elaina Hoops, MD;  Location: [place] NEURO ORS;  Service: Neurosurgery;  Laterality: Left;  . CARPAL TUNNEL RELEASE Right   . CERVICAL FUSION  2007  . CESAREAN SECTION  1985  . CHEST SURGERY  04/2015   Benign chest mass removed- [place]  . COLONOSCOPY    . EP IMPLANTABLE DEVICE N/A 10/06/2015   Procedure: Loop Recorder Insertion;  Surgeon: Thompson Grayer, MD;  Location: [facility] CV LAB;  Service: Cardiovascular;  Laterality: N/A;  . implantable loop recorder removal  08/10/2019   Medtronic Reveal LINQ removed in office by Dr Rayann Heman  . KNEE ARTHROSCOPY Right 1999  . KNEE ARTHROSCOPY WITH MEDIAL MENISECTOMY Right 10/09/2013   Procedure: RIGHT KNEE ARTHROSCOPY WITH MEDIAL MENISECTOMY;  Surgeon: Johnny Bridge, MD;  Location: [facility];  Service: Orthopedics;  Laterality: Right;  clean  . TEE WITHOUT CARDIOVERSION N/A 10/05/2015   Procedure: TRANSESOPHAGEAL ECHOCARDIOGRAM (TEE);  Surgeon: Thayer Headings, MD;  Location: [facility] ENDOSCOPY;  Service: Cardiovascular;  Laterality: N/A;  . thumb surgery Right     Current Outpatient Medications  Medication Sig Dispense Refill  . acetaminophen (TYLENOL) 500 MG tablet Take 500-1,000 mg by mouth every 6 (six) hours as needed for headache (depends on pain).    Marland Kitchen acyclovir (ZOVIRAX) 400 MG tablet Take 1 tablet (400 mg total) by mouth 2 (two) times daily. 180 tablet 3  . atorvastatin (LIPITOR) 40 MG tablet Take 1 tablet (40 mg total) by mouth daily. 90 tablet 3  . cloNIDine (CATAPRES) 0.1 MG tablet TAKE 1 TABLET TWICE DAILY 180 tablet 3  . clotrimazole (LOTRIMIN) 1 % cream Apply 1 application topically 2 (two) times daily. To face 30 g 1  . hydrochlorothiazide (HYDRODIURIL) 25 MG tablet Take 1 tablet (25 mg total) by mouth daily. 90 tablet 3  .  HYDROcodone-acetaminophen (NORCO) 10-325 MG tablet Take 1 tablet by mouth every 8 (eight) hours as needed. 30 tablet 0  . LORazepam (ATIVAN) 0.5 MG tablet Take 1 tablet (0.5 mg total) by mouth 2 (two) times daily as needed. for anxiety 30 tablet 1  . metoprolol succinate (TOPROL XL) 25 MG 24 hr tablet Take 1 tablet (25 mg total) by mouth daily. 90 tablet 3  . potassium chloride SA (KLOR-CON) 20 MEQ tablet Take 1 tablet (20 mEq total) by mouth daily for 3 days. 3 tablet 0  . rivaroxaban (XARELTO) 20 MG TABS tablet Take 1 tablet (20 mg total) by mouth daily with supper. 90 tablet 2  . tiZANidine (ZANAFLEX) 4 MG tablet Take 1 tablet (4 mg total) by mouth every 6 (six) hours as needed for muscle spasms. 30 tablet 1  . triamcinolone cream (KENALOG) 0.1 % Apply 1 application topically 2 (two) times daily. 30 g 1   No current facility-administered medications for this visit.    Allergies as of 10/13/2020 - Review Complete 09/02/2020  Allergen Reaction  Noted  . Azithromycin Nausea And Vomiting 01/31/2018  . Floxin [ofloxacin] Nausea And Vomiting 02/18/2012  . Penicillins Rash 06/27/2007    Family History  Problem Relation Age of Onset  . Hypertension Mother   . Diabetes Mother   . Cancer Maternal Grandmother        colon  . Stroke Maternal Grandmother     Social History   Socioeconomic History  . Marital status: Single    Spouse name: Not on file  . Number of children: Not on file  . Years of education: Not on file  . Highest education level: Not on file  Occupational History  . Not on file  Tobacco Use  . Smoking status: Former Smoker    Types: Cigarettes    Quit date: 07/07/2014    Years since quitting: 6.2  . Smokeless tobacco: Never Used  . Tobacco comment: may smoke 2 cigarettes per month  Substance and Sexual Activity  . Alcohol use: Yes    Comment: occ  . Drug use: No  . Sexual activity: Yes    Birth control/protection: Surgical  Other Topics Concern  . Not on file   Social History Narrative  . Not on file   Social Determinants of Health   Financial Resource Strain: Not on file  Food Insecurity: Not on file  Transportation Needs: Not on file  Physical Activity: Not on file  Stress: Not on file  Social Connections: Not on file  Intimate Partner Violence: Not on file    Review of Systems: 12 system ROS is negative except as noted above.   Physical Exam: General:   Alert,  well-nourished, pleasant and cooperative in NAD Head:  Normocephalic and atraumatic. Eyes:  Sclera clear, no icterus.   Conjunctiva pink. Ears:  Normal auditory acuity. Nose:  No deformity, discharge,  or lesions. Mouth:  No deformity or lesions.   Neck:  Supple; no masses or thyromegaly. Lungs:  Clear throughout to auscultation.   No wheezes. Heart:  Regular rate and rhythm; no murmurs. Abdomen:  Soft,nontender, nondistended, normal bowel sounds, no rebound or guarding. No hepatosplenomegaly.   Rectal:  Deferred  Msk:  Symmetrical. No boney deformities LAD: No inguinal or umbilical LAD Extremities:  No clubbing or edema. Neurologic:  Alert and  oriented x4;  grossly nonfocal Skin:  Intact without significant lesions or rashes. Psych:  Alert and cooperative. Normal mood and affect.      Ciarah Peace L. Tarri Glenn, MD, MPH 10/13/2020, 2:17 PM

## 2020-10-13 NOTE — Patient Instructions (Signed)
If you are age 63 or older, your body mass index should be between 23-30. Your Body mass index is 21.95 kg/m. If this is out of the aforementioned range listed, please consider follow up with your Primary Care Provider.  If you are age 48 or younger, your body mass index should be between 19-25. Your Body mass index is 21.95 kg/m. If this is out of the aformentioned range listed, please consider follow up with your Primary Care Provider.    You have been scheduled for a colonoscopy. Please follow written instructions given to you at your visit today.  Please pick up your prep supplies at the pharmacy within the next 1-3 days. If you use inhalers (even only as needed), please bring them with you on the day of your procedure.  It was great seeing you today!  Thank you for entrusting me with your care and choosing [facility].  Dr. Tarri Glenn

## 2020-10-14 ENCOUNTER — Other Ambulatory Visit: Payer: Self-pay | Admitting: Physician Assistant

## 2020-10-25 NOTE — Telephone Encounter (Signed)
I would recommend that we hold xarelto 24 hours only and resume as soon as able post procedure.  If additional time off of OAC is required, I will defer to procedure performing specialist.

## 2020-10-26 NOTE — Telephone Encounter (Signed)
   Primary Cardiologist: Hillis Range, MD  Chart reviewed as part of pre-operative protocol coverage. Request received for pharmacy clearance only.  Alicia Bolton is on Xarelto due to paroxysmal atrial fibrillation.  She has a CHADS2VASc of 4, CrCl 68 mL/min, and platelet count 256K. Due to her history of stroke, reviewed by PharmD and Dr. Johney Frame.   Per Dr. Johney Frame, "I would recommend that we hold xarelto 24 hours only and resume as soon as possible post procedure. If additional time off OAC is required, I will defer to procedure performing specialist".   I will route this recommendation to the requesting party via Epic fax function and remove from pre-op pool.  Please call with questions.  Alver Sorrow, NP 10/26/2020, 8:13 AM

## 2020-10-27 NOTE — Telephone Encounter (Signed)
Dr. Johney Frame feels that due to the patient's history of stroke that it would be best that she only stop her Xarelto for 24 hours instead of 2 days and then start back as soon as possible following the procedure. Are you okay with this?

## 2020-11-01 NOTE — Telephone Encounter (Signed)
OK to proceed.  Thanks

## 2020-11-01 NOTE — Telephone Encounter (Signed)
Called pt to inform about instructions provided below. LVM requesting returned call.

## 2020-11-02 NOTE — Telephone Encounter (Signed)
SECOND ATTEMPT: ° °LVM requesting returned call. °

## 2020-11-03 NOTE — Telephone Encounter (Signed)
FINAL ATTEMPT:  Called pt to inform about Dr. Jenel Lucks response below re: holding Xarelto. LVM requesting returned call. Letter has been mailed informing pt about Dr. Jenel Lucks recommendation. Letter can be found in Epic and below:  Letter by Deon Pilling, LPN on 05/31/3824     [facility] [address] [place], [place]  [address] Phone:  [phone]   Fax:  [phone]     MRN: [id number] Alicia Bolton [address] [place] [address]     Date: 11/03/2020   Dear [name],   We have been unable to reach you by telephone regarding your Xarelto. After further discussion with Dr. Johney Frame and for safety reasons, we are advising you to hold your Xarelto 24 hours prior to your scheduled procedure. If you have any questions or concerns, please contact Dr. Jenel Lucks office for further advice about this medication.     Thank you for trusting me with your gastrointestinal care!     Tressia Danas, MD, MPH      [facility]                       [name], [phone]                   1

## 2020-11-07 ENCOUNTER — Other Ambulatory Visit: Payer: Self-pay | Admitting: Family Medicine

## 2020-11-07 DIAGNOSIS — I10 Essential (primary) hypertension: Secondary | ICD-10-CM

## 2020-11-12 ENCOUNTER — Other Ambulatory Visit: Payer: Self-pay | Admitting: Family Medicine

## 2020-11-15 MED ORDER — HYDROCODONE-ACETAMINOPHEN 10-325 MG PO TABS: 1.0000 | ORAL_TABLET | Freq: Three times a day (TID) | ORAL | 0 refills | Status: DC | PRN

## 2020-11-17 ENCOUNTER — Encounter: Payer: Self-pay | Admitting: Family Medicine

## 2020-11-21 ENCOUNTER — Telehealth (HOSPITAL_COMMUNITY): Payer: Self-pay | Admitting: *Deleted

## 2020-11-21 ENCOUNTER — Encounter (HOSPITAL_COMMUNITY): Payer: Self-pay | Admitting: *Deleted

## 2020-11-21 NOTE — Telephone Encounter (Signed)
Patient with diagnosis of atrial fibrillation on Xarelto for anticoagulation. Of note, patient has history of cryptogenic stroke (2016) and recent clearance for colonoscopy noted patient should only hold Xarelto for 1 day.   Procedure: teeth extraction (2-4 teeth) Date of procedure: 11/23/2020   CHA2DS2-VASc Score = 5  This indicates a 7.2% annual risk of stroke. The patient's score is based upon: CHF History: Yes HTN History: Yes Diabetes History: No Stroke History: Yes Vascular Disease History: No Age Score: 0 Gender Score: 1  CrCl 68 mL/min Platelet count 356  Patient does not require pre-op antibiotics for dental procedure.  Recommend patient can hold Xarelto for 1 day prior to procedure. If a longer hold is needed, patient will need bridge.

## 2020-11-21 NOTE — Telephone Encounter (Signed)
Patient states she is scheduled to have 2-4 teeth extracted on Wednesday and needs to know what she should do regarding Xarelto. Will need letter to give to dentist at [facility] if she should continue or stop.

## 2020-11-21 NOTE — Telephone Encounter (Signed)
[place] letter sent per patient request.

## 2020-11-22 NOTE — Progress Notes (Signed)
Cardiology Office Note:    Date:  11/23/2020   ID:  Alicia Bolton, DOB 10/01/1956, MRN [id number]  PCP:  Alycia Rossetti, MD  [facility] Cardiologist:  Thompson Grayer, MD / Richardson Dopp, PA-C  [place] Electrophysiologist:  None   Referring MD: Alycia Rossetti, MD   Chief Complaint:  Follow-up (CHF)    Patient Profile:    Alicia Bolton is a 65 y.o. female with:   Paroxysmal atrial fibrillation  ? CHA2DS2-VASc=4 (female, HTN, stroke) >> Rivaroxaban  Cryptogenic stroke 2016 ? 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 was evaluated in 1/21 for mildly reduced ejection fraction.  I set her up for a Myoview which demonstrated no ischemia, EF 46%.  The study was felt to be low risk.  She has been managed medically.  She returns for follow-up.  She is here alone.  She has not had significant dyspnea.  She remains very active.  She walks her dog (Yorkie) several times a day.  She has not had orthopnea, leg edema, chest pain or syncope.      Past Medical History:  Diagnosis Date  . Anxiety    takes Ativan daily as needed  . Arthritis   . Carpal tunnel syndrome 07/17/2013  . Chest mass 04/17/2015  . Chronic migraine 02/08/2016  . DDD (degenerative disc disease), lumbar 04/17/2017  . Dilated cardiomyopathy ([place]) 12/09/2019   EF 45-50 by echocardiogram 09/2019 // Myoview 11/2019: EF 46, no ischemia, mild apical  thinning artifact; Low Risk   . DISORDER, TOBACCO USE 06/27/2007   Qualifier: Diagnosis of  By: Jobe Igo MD, Shanon Brow    . Elevated liver function tests 10/04/2014  . Fibromyalgia   . GAD (generalized anxiety disorder) 10/10/2017  . History of bronchitis    few yrs ago  . History of colon polyps    benign  . History of migraine    last one over a month ago  . Hyperlipidemia    takes Atorvastatin daily  . Hypertension    takes Clonidine and HCTZ daily  . Joint pain   . Migraines   . Muscle spasms of neck 06/06/2016  . Neuromuscular disorder ([place])   . Neuropathy 04/25/2016  . Nocturia   . PAF (paroxysmal atrial fibrillation) ([place]) 09/17/2018  . Recurrent boils 04/25/2016  . Spinal stenosis of cervical region 10/25/2014  . Stroke [facility]) 78/2956   Embolic Right ACA,  ILR placed Dr. Rayann Heman.States slight weakness in left  . Tear of medial meniscus of right knee 10/09/2013    Current Medications: Current Meds  Medication Sig  . acyclovir (ZOVIRAX) 400 MG tablet Take 1 tablet (400 mg total) by mouth 2 (two) times daily.  Marland Kitchen atorvastatin (LIPITOR) 40 MG tablet Take 1 tablet (40 mg total) by mouth daily.  . cloNIDine (CATAPRES - DOSED IN MG/24 HR) 0.1 mg/24hr patch   . clotrimazole (LOTRIMIN) 1 % cream Apply 1 application topically  2 (two) times daily. To face  . hydrochlorothiazide (HYDRODIURIL) 25 MG tablet Take 1 tablet (25 mg total) by mouth daily.  Marland Kitchen HYDROcodone-acetaminophen (NORCO) 10-325 MG tablet Take 1 tablet by mouth every 8 (eight) hours as needed.  Marland Kitchen LORazepam (ATIVAN) 0.5 MG tablet Take 1 tablet (0.5 mg total) by mouth 2 (two) times daily as needed. for anxiety  . metoprolol succinate (TOPROL-XL) 25 MG 24 hr tablet TAKE 1 TABLET EVERY DAY  . PEG-KCl-NaCl-NaSulf-Na Asc-C (PLENVU) 140 g SOLR Take 1 kit by mouth as directed. Use coupon: BIN: 569794 PNC: CNRX Group: [id number] ID: [id number]  . rivaroxaban (XARELTO) 20 MG TABS tablet Take 1 tablet (20 mg total) by mouth daily with  supper.  Marland Kitchen tiZANidine (ZANAFLEX) 4 MG tablet Take 1 tablet (4 mg total) by mouth every 6 (six) hours as needed for muscle spasms.  Marland Kitchen triamcinolone cream (KENALOG) 0.1 % Apply 1 application topically 2 (two) times daily.     Allergies:   Azithromycin, Floxin [ofloxacin], and Penicillins   Social History   Tobacco Use  . Smoking status: Former Smoker    Types: Cigarettes    Quit date: 07/07/2014    Years since quitting: 6.3  . Smokeless tobacco: Never Used  . Tobacco comment: may smoke 2 cigarettes per month  Substance Use Topics  . Alcohol use: Yes    Comment: occ  . Drug use: No     Family Hx: The patient's family history includes Cancer in her maternal grandmother; Colon polyps in her maternal grandmother; Diabetes in her mother; Hypertension in her mother; Stroke in her maternal grandmother. There is no history of Colon cancer, Pancreatic cancer, or Esophageal cancer.  Review of Systems  Gastrointestinal: Negative for hematochezia and melena.  Genitourinary: Negative for hematuria.     EKGs/Labs/Other Test Reviewed:    EKG:  EKG is   ordered today.  The ekg ordered today demonstrates NSR, HR 71, left axis deviation, no ST-T wave changes, QTC 423, no change from prior tracing  Recent Labs: 01/19/2020: ALT 15; BUN 13; Creat 0.82; Hemoglobin 12.9; Platelets 356; Potassium 3.4; Sodium 138   Recent Lipid Panel Lab Results  Component Value Date/Time   CHOL 137 09/18/2019 10:16 AM   TRIG 101 09/18/2019 10:16 AM   HDL 39 (L) 09/18/2019 10:16 AM   CHOLHDL 3.5 09/18/2019 10:16 AM   LDLCALC 80 09/18/2019 10:16 AM      Risk Assessment/Calculations:    CHA2DS2-VASc Score = 5  This indicates a 7.2% annual risk of stroke. The patient's score is based upon: CHF History: Yes HTN History: Yes Diabetes History: No Stroke History: Yes Vascular Disease History: No Age Score: 0 Gender Score: 1     Physical Exam:    VS:  BP 102/80   Pulse 71   Ht 5' 6"  (1.676 m)   Wt 140  lb 3.2 oz (63.6 kg)   SpO2 97%   BMI 22.63 kg/m     Wt Readings from Last 3 Encounters:  11/23/20 140 lb 3.2 oz (63.6 kg)  10/13/20 136 lb (61.7 kg)  09/02/20 132 lb (59.9 kg)     Constitutional:      Appearance: Healthy appearance. Not in distress.  Neck:     Vascular: JVD normal.  Pulmonary:     Effort: Pulmonary effort is normal.     Breath sounds: No wheezing. No rales.  Cardiovascular:     Normal rate. Regular rhythm. Normal S1. Normal S2.     Murmurs: There  is no murmur.  Edema:    Peripheral edema absent.  Abdominal:     Palpations: Abdomen is soft. There is no hepatomegaly.  Skin:    General: Skin is warm and dry.  Neurological:     Mental Status: Alert and oriented to person, place and time.     Cranial Nerves: Cranial nerves are intact.      ASSESSMENT & PLAN:    1. HFmrEF (heart failure with mildly reduced EF) EF approximately 45.  She had a Myoview last year that demonstrated no ischemia.  She likely has a nonischemic cardiomyopathy.  She is doing well without significant dyspnea.  She is NYHA II.  Volume status appears stable.  Continue current dose of beta-blocker.  She does take HCTZ.  We could switch her to spironolactone.  She is pleased with her blood pressure and would like to avoid a change in medications unless necessary.  I will arrange a follow-up BMET.  If her potassium is low, we can switch from HCTZ to spironolactone 25 mg.  I do not think her blood pressure would allow [place] to put her on ACE/ARB unless we stop clonidine.  As noted, she would like to avoid coming off of clonidine if possible.  I will see her back in 6 months.  I will likely arrange another echocardiogram at some point.  If her EF is lower, we will need to stop clonidine and place her on ACE/ARB/ARNI.  2. Paroxysmal atrial fibrillation (HCC) Maintaining normal sinus rhythm.  She is tolerating anticoagulation with Rivaroxaban.  Continue current Rx.  Arrange BMET, CBC.   3. Essential  hypertension The patient's blood pressure is controlled on her current regimen.  Continue current therapy.      Dispo:  Return in about 6 months (around 05/23/2021) for Routine Follow Up, w/ Richardson Dopp, PA-C, in person.   Medication Adjustments/Labs and Tests Ordered: Current medicines are reviewed at length with the patient today.  Concerns regarding medicines are outlined above.  Tests Ordered: Orders Placed This Encounter  Procedures  . EKG 12-Lead   Medication Changes: No orders of the defined types were placed in this encounter.   Signed, Richardson Dopp, PA-C  11/23/2020 10:53 AM    [facility] [place], [place], [place]  [address] Phone: [phone]; Fax: [phone]

## 2020-11-23 ENCOUNTER — Other Ambulatory Visit: Payer: Self-pay

## 2020-11-23 ENCOUNTER — Encounter: Payer: Self-pay | Admitting: Physician Assistant

## 2020-11-23 ENCOUNTER — Ambulatory Visit (INDEPENDENT_AMBULATORY_CARE_PROVIDER_SITE_OTHER): Payer: Medicare HMO | Admitting: Physician Assistant

## 2020-11-23 VITALS — BP 102/80 | HR 71 | Ht 66.0 in | Wt 140.2 lb

## 2020-11-23 DIAGNOSIS — I480 Paroxysmal atrial fibrillation: Secondary | ICD-10-CM | POA: Diagnosis not present

## 2020-11-23 DIAGNOSIS — E782 Mixed hyperlipidemia: Secondary | ICD-10-CM

## 2020-11-23 DIAGNOSIS — I10 Essential (primary) hypertension: Secondary | ICD-10-CM | POA: Diagnosis not present

## 2020-11-23 DIAGNOSIS — I5020 Unspecified systolic (congestive) heart failure: Secondary | ICD-10-CM

## 2020-11-23 DIAGNOSIS — I420 Dilated cardiomyopathy: Secondary | ICD-10-CM

## 2020-11-23 NOTE — Patient Instructions (Signed)
Medication Instructions:  Your physician recommends that you continue on your current medications as directed. Please refer to the Current Medication list given to you today.  *If you need a refill on your cardiac medications before your next appointment, please call your pharmacy*  Lab Work: None ordered today  Testing/Procedures: None ordered today  Follow-Up: At [facility], you and your health needs are our priority.  As part of our continuing mission to provide you with exceptional heart care, we have created designated Provider Care Teams.  These Care Teams include your primary Cardiologist (physician) and Advanced Practice Providers (APPs -  Physician Assistants and Nurse Practitioners) who all work together to provide you with the care you need, when you need it.  Your next appointment:   6 month(s)  The format for your next appointment:   In Person  Provider:   Scott Weaver, PA-C   

## 2020-12-05 DIAGNOSIS — R110 Nausea: Secondary | ICD-10-CM | POA: Diagnosis not present

## 2020-12-05 DIAGNOSIS — R112 Nausea with vomiting, unspecified: Secondary | ICD-10-CM | POA: Diagnosis not present

## 2020-12-05 DIAGNOSIS — R1030 Lower abdominal pain, unspecified: Secondary | ICD-10-CM | POA: Diagnosis not present

## 2020-12-05 DIAGNOSIS — R1084 Generalized abdominal pain: Secondary | ICD-10-CM | POA: Diagnosis not present

## 2020-12-05 DIAGNOSIS — R197 Diarrhea, unspecified: Secondary | ICD-10-CM | POA: Diagnosis not present

## 2020-12-06 ENCOUNTER — Encounter: Payer: Medicare HMO | Admitting: Gastroenterology

## 2020-12-06 ENCOUNTER — Ambulatory Visit: Payer: Medicare HMO | Admitting: Nurse Practitioner

## 2020-12-06 NOTE — Progress Notes (Deleted)
Subjective:    Patient ID: Alicia Bolton, female    DOB: 1956/02/07, 65 y.o.   MRN: [id number]  HPI: Alicia Bolton is a 65 y.o. female presenting for  No chief complaint on file.  ABDOMINAL PAIN  Duration: Onset: {Blank single:19197::"sudden","gradual"} Severity: Quality: Location:  {Blank multiple:19196::"LUQ","RUQ","epigastric","peri-umbilical","LLQ","RLQ","diffuse","suprapubic". "lower abdominal quadrants"}  Episode duration:  Radiation: {Blank single:19197::"yes","no"} Frequency: Alleviating factors:  Aggravating factors: Status: Treatments attempted: Fever: {Blank single:19197::"yes","no"} Nausea: {Blank single:19197::"yes","no"} Vomiting: {Blank single:19197::"yes","no"} Weight loss: {Blank single:19197::"yes","no"} Decreased appetite: {Blank single:19197::"yes","no"} Diarrhea: {Blank single:19197::"yes","no"} Constipation: {Blank single:19197::"yes","no"} Blood in stool: {Blank single:19197::"yes","no"} Heartburn: {Blank single:19197::"yes","no"} Jaundice: {Blank single:19197::"yes","no"} Rash: {Blank single:19197::"yes","no"} Dysuria/urinary frequency: {Blank single:19197::"yes","no"} Hematuria: {Blank single:19197::"yes","no"} History of sexually transmitted disease: {Blank single:19197::"yes","no"} Recurrent NSAID use: {Blank single:19197::"yes","no"}    Allergies  Allergen Reactions  . Azithromycin Nausea And Vomiting  . Floxin [Ofloxacin] Nausea And Vomiting    Makes sick to stomach and vomit  . Penicillins Rash    Has patient had a PCN reaction causing immediate rash, facial/tongue/throat swelling, SOB or lightheadedness with hypotension: No Has patient had a PCN reaction causing severe rash involving mucus membranes or skin necrosis: No Has patient had a PCN reaction that required hospitalization No Has patient had a PCN reaction occurring within the last 10 years: No If all of the above answers are "NO", then may proceed with Cephalosporin  use.     Outpatient Encounter Medications as of 12/06/2020  Medication Sig  . acyclovir (ZOVIRAX) 400 MG tablet Take 1 tablet (400 mg total) by mouth 2 (two) times daily.  Marland Kitchen atorvastatin (LIPITOR) 40 MG tablet Take 1 tablet (40 mg total) by mouth daily.  . cloNIDine (CATAPRES - DOSED IN MG/24 HR) 0.1 mg/24hr patch   . clotrimazole (LOTRIMIN) 1 % cream Apply 1 application topically 2 (two) times daily. To face  . hydrochlorothiazide (HYDRODIURIL) 25 MG tablet Take 1 tablet (25 mg total) by mouth daily.  Marland Kitchen HYDROcodone-acetaminophen (NORCO) 10-325 MG tablet Take 1 tablet by mouth every 8 (eight) hours as needed.  Marland Kitchen LORazepam (ATIVAN) 0.5 MG tablet Take 1 tablet (0.5 mg total) by mouth 2 (two) times daily as needed. for anxiety  . metoprolol succinate (TOPROL-XL) 25 MG 24 hr tablet TAKE 1 TABLET EVERY DAY  . PEG-KCl-NaCl-NaSulf-Na Asc-C (PLENVU) 140 g SOLR Take 1 kit by mouth as directed. Use coupon: BIN: [id number] PNC: CNRX Group: [id number] ID: [id number]  . rivaroxaban (XARELTO) 20 MG TABS tablet Take 1 tablet (20 mg total) by mouth daily with supper.  Marland Kitchen tiZANidine (ZANAFLEX) 4 MG tablet Take 1 tablet (4 mg total) by mouth every 6 (six) hours as needed for muscle spasms.  Marland Kitchen triamcinolone cream (KENALOG) 0.1 % Apply 1 application topically 2 (two) times daily.   No facility-administered encounter medications on file as of 12/06/2020.    Patient Active Problem List   Diagnosis Date Noted  . Dilated cardiomyopathy ([place]) 12/09/2019  . PAF (paroxysmal atrial fibrillation) ([place]) 09/17/2018  . GAD (generalized anxiety disorder) 10/10/2017  . DDD (degenerative disc disease), lumbar 04/17/2017  . Muscle spasms of neck 06/06/2016  . Neuropathy 04/25/2016  . Recurrent boils 04/25/2016  . Hyperlipidemia 02/16/2016  . Chronic migraine 02/08/2016  . HTN (hypertension) 12/08/2015  . Cryptogenic stroke ([place]) 10/02/2015  . Chest mass 04/17/2015  . Spinal stenosis of cervical region 10/25/2014  .  Elevated liver function tests 10/04/2014  . Polyarthralgia 09/20/2014  . Hidradenitis suppurativa 07/21/2014  . Tear of medial meniscus of right knee 10/09/2013  .  Carpal tunnel syndrome 07/17/2013  . HEADACHE 06/27/2007    Past Medical History:  Diagnosis Date  . Anxiety    takes Ativan daily as needed  . Arthritis   . Carpal tunnel syndrome 07/17/2013  . Chest mass 04/17/2015  . Chronic migraine 02/08/2016  . DDD (degenerative disc disease), lumbar 04/17/2017  . Dilated cardiomyopathy ([place]) 12/09/2019   EF 45-50 by echocardiogram 09/2019 // Myoview 11/2019: EF 46, no ischemia, mild apical thinning artifact; Low Risk   . DISORDER, TOBACCO USE 06/27/2007   Qualifier: Diagnosis of  By: Jobe Igo MD, Shanon Brow    . Elevated liver function tests 10/04/2014  . Fibromyalgia   . GAD (generalized anxiety disorder) 10/10/2017  . History of bronchitis    few yrs ago  . History of colon polyps    benign  . History of migraine    last one over a month ago  . Hyperlipidemia    takes Atorvastatin daily  . Hypertension    takes Clonidine and HCTZ daily  . Joint pain   . Migraines   . Muscle spasms of neck 06/06/2016  . Neuromuscular disorder ([place])   . Neuropathy 04/25/2016  . Nocturia   . PAF (paroxysmal atrial fibrillation) ([place]) 09/17/2018  . Recurrent boils 04/25/2016  . Spinal stenosis of cervical region 10/25/2014  . Stroke [facility]) 43/1540   Embolic Right ACA,  ILR placed Dr. Rayann Heman.States slight weakness in left  . Tear of medial meniscus of right knee 10/09/2013    Relevant past medical, surgical, family and social history reviewed and updated as indicated. Interim medical history since our last visit reviewed.  Review of Systems  Per HPI unless specifically indicated above     Objective:    There were no vitals taken for this visit.  Wt Readings from Last 3 Encounters:  11/23/20 140 lb 3.2 oz (63.6 kg)  10/13/20 136 lb (61.7 kg)  09/02/20 132 lb (59.9 kg)    Physical  Exam  Results for orders placed or performed in visit on 01/19/20  CBC with Differential/Platelet  Result Value Ref Range   WBC 8.6 3.8 - 10.8 Thousand/uL   RBC 4.09 3.80 - 5.10 Million/uL   Hemoglobin 12.9 11.7 - 15.5 g/dL   HCT 38.9 35.0 - 45.0 %   MCV 95.1 80.0 - 100.0 fL   MCH 31.5 27.0 - 33.0 pg   MCHC 33.2 32.0 - 36.0 g/dL   RDW 13.1 11.0 - 15.0 %   Platelets 356 140 - 400 Thousand/uL   MPV 9.3 7.5 - 12.5 fL   Neutro Abs 3,810 1,500 - 7,800 cells/uL   Lymphs Abs 4,094 (H) 850 - 3,900 cells/uL   Absolute Monocytes 525 200 - 950 cells/uL   Eosinophils Absolute 112 15 - 500 cells/uL   Basophils Absolute 60 0 - 200 cells/uL   Neutrophils Relative % 44.3 %   Total Lymphocyte 47.6 %   Monocytes Relative 6.1 %   Eosinophils Relative 1.3 %   Basophils Relative 0.7 %  Comprehensive metabolic panel  Result Value Ref Range   Glucose, Bld 90 65 - 99 mg/dL   BUN 13 7 - 25 mg/dL   Creat 0.82 0.50 - 0.99 mg/dL   BUN/Creatinine Ratio NOT APPLICABLE 6 - 22 (calc)   Sodium 138 135 - 146 mmol/L   Potassium 3.4 (L) 3.5 - 5.3 mmol/L   Chloride 103 98 - 110 mmol/L   CO2 29 20 - 32 mmol/L   Calcium 9.0 8.6 - 10.4 mg/dL  Total Protein 7.0 6.1 - 8.1 g/dL   Albumin 3.9 3.6 - 5.1 g/dL   Globulin 3.1 1.9 - 3.7 g/dL (calc)   AG Ratio 1.3 1.0 - 2.5 (calc)   Total Bilirubin 0.6 0.2 - 1.2 mg/dL   Alkaline phosphatase (APISO) 84 37 - 153 U/L   AST 16 10 - 35 U/L   ALT 15 6 - 29 U/L      Assessment & Plan:   Problem List Items Addressed This Visit   None      Follow up plan: No follow-ups on file.

## 2020-12-10 ENCOUNTER — Encounter: Payer: Self-pay | Admitting: Family Medicine

## 2020-12-12 MED ORDER — HYDROCODONE-ACETAMINOPHEN 10-325 MG PO TABS: 1.0000 | ORAL_TABLET | Freq: Three times a day (TID) | ORAL | 0 refills | Status: DC | PRN

## 2020-12-12 NOTE — Telephone Encounter (Signed)
Ok to refill??  Last office visit 09/02/2020.  Last refill 11/15/2020.

## 2020-12-26 ENCOUNTER — Encounter: Payer: Self-pay | Admitting: Family Medicine

## 2020-12-29 ENCOUNTER — Encounter: Payer: Self-pay | Admitting: Family Medicine

## 2020-12-29 ENCOUNTER — Other Ambulatory Visit: Payer: Self-pay | Admitting: Physician Assistant

## 2020-12-29 MED ORDER — ACYCLOVIR 400 MG PO TABS
400.0000 mg | ORAL_TABLET | Freq: Two times a day (BID) | ORAL | 3 refills | Status: DC
Start: 2020-12-29 — End: 2021-11-07

## 2020-12-29 MED ORDER — ATORVASTATIN CALCIUM 40 MG PO TABS
40.0000 mg | ORAL_TABLET | Freq: Every day | ORAL | 3 refills | Status: DC
Start: 2020-12-29 — End: 2021-11-07

## 2020-12-29 MED ORDER — HYDROCHLOROTHIAZIDE 25 MG PO TABS
25.0000 mg | ORAL_TABLET | Freq: Every day | ORAL | 3 refills | Status: DC
Start: 2020-12-29 — End: 2021-04-14

## 2020-12-29 NOTE — Telephone Encounter (Signed)
Ok to refill??  Last office visit 09/12/2020.  Last refill 12/12/2020.

## 2020-12-29 NOTE — Telephone Encounter (Signed)
Patient has prescription for clonidine patches from cardiology on chart. States that she has been taking tablets prescribed by PCP.   Requesting refill on tablets.   Please advise.

## 2020-12-30 MED ORDER — HYDROCODONE-ACETAMINOPHEN 10-325 MG PO TABS: 1.0000 | ORAL_TABLET | Freq: Three times a day (TID) | ORAL | 0 refills | Status: DC | PRN

## 2020-12-30 MED ORDER — CLONIDINE HCL 0.1 MG PO TABS: 0.1000 mg | ORAL_TABLET | Freq: Two times a day (BID) | ORAL | 1 refills | Status: DC

## 2021-01-03 ENCOUNTER — Telehealth: Payer: Self-pay | Admitting: Family Medicine

## 2021-01-03 NOTE — Progress Notes (Signed)
  Chronic Care Management   Outreach Note  01/03/2021 Name: Alicia Bolton MRN: [id number] DOB: 14-Oct-1956  Referred by: Alycia Rossetti, MD Reason for referral : No chief complaint on file.   An unsuccessful telephone outreach was attempted today. The patient was referred to the pharmacist for assistance with care management and care coordination.   Follow Up Plan:   Carley Perdue [employer] Scheduler

## 2021-01-05 ENCOUNTER — Telehealth: Payer: Self-pay | Admitting: Family Medicine

## 2021-01-05 NOTE — Progress Notes (Signed)
  Chronic Care Management   Outreach Note  01/05/2021 Name: Alicia Bolton MRN: [id number] DOB: 1955/11/08  Referred by: Alycia Rossetti, MD Reason for referral : No chief complaint on file.   A second unsuccessful telephone outreach was attempted today. The patient was referred to pharmacist for assistance with care management and care coordination.  Follow Up Plan:   Carley Perdue [employer] Scheduler

## 2021-01-06 ENCOUNTER — Encounter: Payer: Self-pay | Admitting: Family Medicine

## 2021-01-12 ENCOUNTER — Other Ambulatory Visit (HOSPITAL_COMMUNITY): Payer: Self-pay | Admitting: Nurse Practitioner

## 2021-01-12 DIAGNOSIS — I480 Paroxysmal atrial fibrillation: Secondary | ICD-10-CM

## 2021-01-12 NOTE — Telephone Encounter (Signed)
Xarelto 20mg  refill request received. Pt is 65 years old, weight-63.6kg, Crea-0.82 on 01/19/2020, last seen by Richardson Dopp on 11/23/2020, Diagnosis-Afib, CrCl-70.84ml/min; Dose is appropriate based on dosing criteria. Will send in refill to requested pharmacy.    Called pt since she will needs recent labs; pt verbalized understanding and willing to come next week, she stated she was supposed to get them done with PCP and her PCP is no longer doing primary care. She will come next week in the morning & she is aware a refill will be sent.

## 2021-01-17 ENCOUNTER — Other Ambulatory Visit: Payer: Medicare HMO

## 2021-02-01 ENCOUNTER — Other Ambulatory Visit: Payer: Medicare HMO

## 2021-02-10 ENCOUNTER — Telehealth: Payer: Self-pay | Admitting: Family Medicine

## 2021-02-10 NOTE — Progress Notes (Signed)
  Chronic Care Management   Outreach Note  02/10/2021 Name: Alicia Bolton MRN: [id number] DOB: 09/27/1956  Referred by: Alycia Rossetti, MD Reason for referral : No chief complaint on file.   Third unsuccessful telephone outreach was attempted today. The patient was referred to the pharmacist for assistance with care management and care coordination.   Follow Up Plan:   Carley Perdue [employer] Scheduler

## 2021-02-14 ENCOUNTER — Telehealth: Payer: Self-pay | Admitting: Family Medicine

## 2021-02-15 ENCOUNTER — Other Ambulatory Visit: Payer: Self-pay | Admitting: Family Medicine

## 2021-02-15 MED ORDER — HYDROCODONE-ACETAMINOPHEN 10-325 MG PO TABS: 1.0000 | ORAL_TABLET | Freq: Three times a day (TID) | ORAL | 0 refills | Status: DC | PRN

## 2021-02-15 NOTE — Telephone Encounter (Signed)
Ok to refill??  Last office visit 09/02/2020.  Last refill 12/30/2020.

## 2021-03-13 ENCOUNTER — Other Ambulatory Visit: Payer: Self-pay | Admitting: Physician Assistant

## 2021-04-09 ENCOUNTER — Other Ambulatory Visit: Payer: Self-pay

## 2021-04-09 ENCOUNTER — Ambulatory Visit (HOSPITAL_COMMUNITY)
Admission: EM | Admit: 2021-04-09 | Discharge: 2021-04-09 | Disposition: A | Discharge status: Home / Self Care | Payer: Medicare HMO

## 2021-04-09 ENCOUNTER — Telehealth: Payer: Self-pay | Admitting: Cardiology

## 2021-04-09 ENCOUNTER — Emergency Department (HOSPITAL_COMMUNITY)
Admission: EM | Admit: 2021-04-09 | Discharge: 2021-04-10 | Disposition: A | Discharge status: Home / Self Care | Payer: Medicare HMO | Attending: Emergency Medicine | Admitting: Emergency Medicine

## 2021-04-09 DIAGNOSIS — Z5321 Procedure and treatment not carried out due to patient leaving prior to being seen by health care provider: Secondary | ICD-10-CM | POA: Diagnosis not present

## 2021-04-09 DIAGNOSIS — R079 Chest pain, unspecified: Secondary | ICD-10-CM | POA: Insufficient documentation

## 2021-04-09 DIAGNOSIS — R002 Palpitations: Secondary | ICD-10-CM | POA: Diagnosis not present

## 2021-04-09 LAB — CBC
HCT: 37.8 % (ref 36.0–46.0)
Hemoglobin: 12.7 g/dL (ref 12.0–15.0)
MCH: 32.2 pg (ref 26.0–34.0)
MCHC: 33.6 g/dL (ref 30.0–36.0)
MCV: 95.9 fL (ref 80.0–100.0)
Platelets: 378 10*3/uL (ref 150–400)
RBC: 3.94 MIL/uL (ref 3.87–5.11)
RDW: 14.9 % (ref 11.5–15.5)
WBC: 7.4 10*3/uL (ref 4.0–10.5)
nRBC: 0 % (ref 0.0–0.2)

## 2021-04-09 LAB — BASIC METABOLIC PANEL
Anion gap: 9 (ref 5–15)
BUN: 13 mg/dL (ref 8–23)
CO2: 25 mmol/L (ref 22–32)
Calcium: 9.1 mg/dL (ref 8.9–10.3)
Chloride: 103 mmol/L (ref 98–111)
Creatinine, Ser: 0.94 mg/dL (ref 0.44–1.00)
GFR, Estimated: >60 mL/min (ref >60)
Glucose, Bld: 116 mg/dL — ABNORMAL HIGH (ref 70–99)
Potassium: 3.2 mmol/L — ABNORMAL LOW (ref 3.5–5.1)
Sodium: 137 mmol/L (ref 135–145)

## 2021-04-09 LAB — TROPONIN I (HIGH SENSITIVITY): Troponin I (High Sensitivity): 11 ng/L (ref <18)

## 2021-04-09 NOTE — ED Notes (Signed)
Called pt x3 for room, no response. Called radiology, pt not found.

## 2021-04-09 NOTE — ED Notes (Signed)
Pt has been called multiple times for her room w/no answer. Called xray, ultrasound & triage and they didn't have her. Moving pt OTF.

## 2021-04-09 NOTE — Telephone Encounter (Signed)
Patient called in reporting left-sided chest pain.  States she started having intermittent episodes of sharp left-sided chest discomfort a couple of hours ago.  Episodes last just a few seconds at a time and then resolved.  No associated shortness of breath, nausea, diaphoresis.  States the pain is mostly near her incisional site from where she had mediastinal mass resection done in 2016.  Patient reports she is already on the way to urgent care to be evaluated.  Agreed with this plan.  Patient thanked me for prompt follow-up phone call.

## 2021-04-09 NOTE — ED Notes (Signed)
Pt came to UC by self POV, states having chest pain xs 1 hour. States "lightening bolt sensations" with hx of open heart surgery. Per Lina Sar PA pt is to go straight to emergency room. Pt agreed and ambulated self to car with no distress.

## 2021-04-09 NOTE — ED Notes (Signed)
This patient has not yet been roomed into 1.

## 2021-04-09 NOTE — ED Triage Notes (Signed)
Pt arrives pov with reports of chest pain and palpitations onset today approx 1 hour pta. Pt in NAD.

## 2021-04-11 ENCOUNTER — Telehealth: Payer: Self-pay | Admitting: *Deleted

## 2021-04-11 NOTE — Telephone Encounter (Signed)
If she has recurrent chest pain she should return to the ED.  I can see her at 745 one morning if she is ok with that.  Richardson Dopp, PA-C    04/11/2021 2:03 PM

## 2021-04-11 NOTE — Telephone Encounter (Signed)
S/w pt is coming in on Friday, June 17 @ 7:45 am. Chest pain has subsided.

## 2021-04-11 NOTE — Telephone Encounter (Signed)
Appt with Richardson Dopp, PA June 17 @ 7:45.  Chest pain has subsided.

## 2021-04-13 NOTE — Progress Notes (Signed)
Cardiology Office Note:    Date:  04/14/2021   ID:  Alicia Bolton, DOB April 16, 1956, MRN [id number]  PCP:  Default, Provider, MD   [facility] Providers Cardiologist:  Thompson Grayer, MD Cardiology APP:  Sharmon Revere      Referring MD: No ref. provider found   Chief Complaint:  Hospitalization Follow-up (ED visit for chest pain )    Patient Profile:    Alicia Bolton is a 65 y.o. 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  She went to the ED 04/09/21 with symptoms of chest pain.  She left before being seen by the provider.  One hs-Trop was neg and her EKG (personally reviewed) did not show any acute changes.  She returns for evaluation.  She is here alone.  She has chest pain off an on throughout the day on 6/12 before she went to the ED.  It was left sided and was pulsating.  She had no associated radiation to her neck or arms. She had no assoc shortness of breath, nausea, diaphoresis.  She has not had exertional chest pain or shortness of breath.  She had some more pain the next day but has not had any since.  She has had reflux like symptoms in the past that would resolve with drinking water, but this was different. She has not had syncope, orthopnea, leg edema.      Past Medical  History:  Diagnosis Date   Anxiety    takes Ativan daily as needed   Arthritis    Carpal tunnel syndrome 07/17/2013   Chest mass 04/17/2015   Chronic migraine 02/08/2016   DDD (degenerative disc disease), lumbar 04/17/2017   Dilated cardiomyopathy ([place]) 12/09/2019   EF 45-50 by echocardiogram 09/2019 // Myoview 11/2019: EF 46, no ischemia, mild apical thinning artifact; Low Risk    DISORDER, TOBACCO USE 06/27/2007   Qualifier: Diagnosis of  By: Jobe Igo MD, Shanon Brow     Elevated liver function tests 10/04/2014   Fibromyalgia    GAD (generalized anxiety disorder) 10/10/2017   History of bronchitis    few yrs ago   History of colon polyps    benign   History of migraine    last one over a month ago   Hyperlipidemia    takes Atorvastatin daily   Hypertension    takes Clonidine and HCTZ daily   Joint pain    Migraines    Muscle spasms of neck 06/06/2016   Neuromuscular disorder ([place])    Neuropathy 04/25/2016   Nocturia    PAF (paroxysmal atrial fibrillation) ([place]) 09/17/2018   Recurrent boils 04/25/2016   Spinal stenosis of cervical region 10/25/2014   Stroke ([place]) 72/6203   Embolic Right ACA,  ILR placed Dr. Rayann Heman.States slight weakness in left   Tear of medial meniscus of right knee 10/09/2013  Current Medications: Current Meds  Medication Sig   acyclovir (ZOVIRAX) 400 MG tablet Take 1 tablet (400 mg total) by mouth 2 (two) times daily.   atorvastatin (LIPITOR) 40 MG tablet Take 1 tablet (40 mg total) by mouth daily.   cloNIDine (CATAPRES) 0.1 MG tablet Take 1 tablet (0.1 mg total) by mouth 2 (two) times daily.   clotrimazole (LOTRIMIN) 1 % cream Apply 1 application topically 2 (two) times daily. To face   HYDROcodone-acetaminophen (NORCO) 10-325 MG tablet Take 1 tablet by mouth every 8 (eight) hours as needed. Chronic Pain. Dx: G89.4   LORazepam (ATIVAN) 0.5 MG tablet Take 1 tablet (0.5 mg total) by mouth 2 (two) times daily as needed. for anxiety   metoprolol succinate (TOPROL-XL)  25 MG 24 hr tablet TAKE 1 TABLET EVERY DAY   PEG-KCl-NaCl-NaSulf-Na Asc-C (PLENVU) 140 g SOLR Take 1 kit by mouth as directed. Use coupon: BIN: [id number] PNC: CNRX Group: [id number] ID: [id number]   spironolactone (ALDACTONE) 25 MG tablet Take 1 tablet (25 mg total) by mouth daily.   tiZANidine (ZANAFLEX) 4 MG tablet Take 1 tablet (4 mg total) by mouth every 6 (six) hours as needed for muscle spasms.   triamcinolone cream (KENALOG) 0.1 % Apply 1 application topically 2 (two) times daily.   XARELTO 20 MG TABS tablet TAKE 1 TABLET (20 MG TOTAL) BY MOUTH DAILY WITH SUPPER.   [DISCONTINUED] hydrochlorothiazide (HYDRODIURIL) 25 MG tablet Take 1 tablet (25 mg total) by mouth daily.     Allergies:   Azithromycin, Floxin [ofloxacin], and Penicillins   Social History   Tobacco Use   Smoking status: Some Days    Pack years: 0.00    Types: Cigarettes    Last attempt to quit: 07/07/2014    Years since quitting: 6.7   Smokeless tobacco: Never   Tobacco comments:    may smoke 2 cigarettes per month  Substance Use Topics   Alcohol use: Yes    Comment: occ   Drug use: No     Family Hx: The patient's family history includes Cancer in her maternal grandmother; Colon polyps in her maternal grandmother; Diabetes in her mother; Hypertension in her mother; Stroke in her maternal grandmother. There is no history of Colon cancer, Pancreatic cancer, or Esophageal cancer.  Review of Systems  Constitutional: Negative for fever and weight loss.  Respiratory:  Negative for cough.   Musculoskeletal:  Positive for muscle cramps.  Gastrointestinal:  Negative for hematochezia and melena.  Genitourinary:  Negative for hematuria.    EKGs/Labs/Other Test Reviewed:    EKG:  EKG is  not ordered today.  The ekg ordered today demonstrates n/a  Recent Labs: 04/09/2021: BUN 13; Creatinine, Ser 0.94; Hemoglobin 12.7; Platelets 378; Potassium 3.2; Sodium 137   Recent Lipid Panel Lab Results  Component Value Date/Time    CHOL 137 09/18/2019 10:16 AM   TRIG 101 09/18/2019 10:16 AM   HDL 39 (L) 09/18/2019 10:16 AM   LDLCALC 80 09/18/2019 10:16 AM      Risk Assessment/Calculations:    CHA2DS2-VASc Score = 5  This indicates a 7.2% annual risk of stroke. The patient's score is based upon: CHF History: Yes HTN History: Yes Diabetes History: No Stroke History: Yes Vascular Disease History: No Age Score: 0 Gender Score: 1     Physical Exam:    VS:  BP (!) 110/98   Pulse 80   Ht $R'5\' 6"'en$  (1.676 m)   Wt 129 lb 12.8 oz (58.9 kg)   SpO2  97%   BMI 20.95 kg/m     Wt Readings from Last 3 Encounters:  04/14/21 129 lb 12.8 oz (58.9 kg)  11/23/20 140 lb 3.2 oz (63.6 kg)  10/13/20 136 lb (61.7 kg)     Constitutional:      Appearance: Healthy appearance. Not in distress.  Neck:     Vascular: JVD normal.  Pulmonary:     Effort: Pulmonary effort is normal.     Breath sounds: No wheezing. No rales.  Cardiovascular:     Normal rate. Regular rhythm. Normal S1. Normal S2.      Murmurs: There is no murmur.  Edema:    Peripheral edema absent.  Abdominal:     Palpations: Abdomen is soft.  Skin:    General: Skin is warm and dry.  Neurological:     Mental Status: Alert and oriented to person, place and time.     Cranial Nerves: Cranial nerves are intact.         ASSESSMENT & PLAN:    1. Precordial chest pain Symptoms sound non-cardiac.  I reviewed her records from the ED. Her ECG was normal.  Her 1st hs-Trop was neg.  She did not have a 2nd.  She has not had any further chest pain since and had no exertional symptoms.  She does have a hx of mild cardiomyopathy.  Myoview in the past was c/w non-ischemic cardiomyopathy.  We discussed +/- proceeding with a coronary CTA vs watchful waiting.  I prefer to continue to observe for now and she is comfortable with this approach.  I will see her again in 3-4 mos for f/u.  She knows to contact me if she has more pain.  At that point, I would proceed with a  coronary CTA.  Today, I will send her for chest x-ray given her prior history of thymectomy and recent chest pain.  2. HFmrEF (heart failure with mildly reduced EF) EF 45.  As noted, previous Myoview low risk End suggestive of nonischemic cardiomyopathy.  Her blood pressure does limit titration of medications for CHF.  She does have a history of hypokalemia.  I will stop her HCTZ and place her on spironolactone.  Continue current dose of metoprolol succinate.  3. Hypokalemia As noted, she has hypokalemia.  I will stop her HCTZ and place her on spironolactone 25 mg daily.  Obtain BMET weekly x2.  4. Paroxysmal atrial fibrillation (HCC) Maintaining sinus rhythm.  She is tolerating anticoagulation.  Recent creatinine and hemoglobin normal.  Continue current dose of rivaroxaban.  5. Essential hypertension Blood pressure is controlled.  As noted, I will change HCTZ to spironolactone given hypokalemia.    Dispo:  Return in about 3 months (around 07/15/2021) for Routine Follow Up, w/ Richardson Dopp, PA-C.   Medication Adjustments/Labs and Tests Ordered: Current medicines are reviewed at length with the patient today.  Concerns regarding medicines are outlined above.  Tests Ordered: Orders Placed This Encounter  Procedures   DG Chest 2 View   Basic Metabolic Panel (BMET)   Basic Metabolic Panel (BMET)   Medication Changes: Meds ordered this encounter  Medications   spironolactone (ALDACTONE) 25 MG tablet    Sig: Take 1 tablet (25 mg total) by mouth daily.    Dispense:  30 tablet    Refill:  1    Signed, Richardson Dopp, PA-C  04/14/2021 8:24 AM    [facility] [address], [place], [place]  [address] Phone: [phone]; Fax: [phone]     

## 2021-04-14 ENCOUNTER — Ambulatory Visit: Payer: Medicare HMO | Admitting: Physician Assistant

## 2021-04-14 ENCOUNTER — Other Ambulatory Visit: Payer: Self-pay

## 2021-04-14 ENCOUNTER — Encounter: Payer: Self-pay | Admitting: Physician Assistant

## 2021-04-14 ENCOUNTER — Ambulatory Visit
Admission: RE | Admit: 2021-04-14 | Discharge: 2021-04-14 | Disposition: A | Discharge status: Home / Self Care | Payer: Medicare HMO | Source: Ambulatory Visit | Attending: Physician Assistant | Admitting: Physician Assistant

## 2021-04-14 VITALS — BP 110/98 | HR 80 | Ht 66.0 in | Wt 129.8 lb

## 2021-04-14 DIAGNOSIS — R072 Precordial pain: Secondary | ICD-10-CM

## 2021-04-14 DIAGNOSIS — E876 Hypokalemia: Secondary | ICD-10-CM

## 2021-04-14 DIAGNOSIS — I10 Essential (primary) hypertension: Secondary | ICD-10-CM

## 2021-04-14 DIAGNOSIS — I5020 Unspecified systolic (congestive) heart failure: Secondary | ICD-10-CM | POA: Diagnosis not present

## 2021-04-14 DIAGNOSIS — I480 Paroxysmal atrial fibrillation: Secondary | ICD-10-CM

## 2021-04-14 DIAGNOSIS — R079 Chest pain, unspecified: Secondary | ICD-10-CM | POA: Diagnosis not present

## 2021-04-14 MED ORDER — SPIRONOLACTONE 25 MG PO TABS: 25.0000 mg | ORAL_TABLET | Freq: Every day | ORAL | 1 refills | Status: DC

## 2021-04-14 NOTE — Progress Notes (Signed)
Pt has been made aware of normal result and verbalized understanding.  jw

## 2021-04-14 NOTE — Addendum Note (Signed)
Addended by: Gaetano Net on: 04/14/2021 02:56 PM   Modules accepted: Orders

## 2021-04-14 NOTE — Patient Instructions (Signed)
Medication Instructions:   START SPIRONOLACTONE one tablet by mouth ( 25 mg ) daily.  DISCONTINUE HCTZ  *If you need a refill on your cardiac medications before your next appointment, please call your pharmacy*   Lab Work: Your physician recommends that you return for lab work in: Thursday, June 23 between 7:30 _ 4:30. Your physician recommends that you return for lab work in: Thursday, June 30 between 7:30 -4:30.   If you have labs (blood work) drawn today and your tests are completely normal, you will receive your results only by: [place] (if you have MyChart) OR A paper copy in the mail If you have any lab test that is abnormal or we need to change your treatment, we will call you to review the results.   Testing/Procedures: A chest x-ray takes a picture of the organs and structures inside the chest, including the heart, lungs, and blood vessels. This test can show several things, including, whether the heart is enlarges; whether fluid is building up in the lungs; and whether pacemaker / defibrillator leads are still in place.  TODAY AT [place].    Follow-Up: At [facility], you and your health needs are our priority.  As part of our continuing mission to provide you with exceptional heart care, we have created designated Provider Care Teams.  These Care Teams include your primary Cardiologist (physician) and Advanced Practice Providers (APPs -  Physician Assistants and Nurse Practitioners) who all work together to provide you with the care you need, when you need it.  We recommend signing up for the patient portal called "MyChart".  Sign up information is provided on this After Visit Summary.  MyChart is used to connect with patients for Virtual Visits (Telemedicine).  Patients are able to view lab/test results, encounter notes, upcoming appointments, etc.  Non-urgent messages can be sent to your provider as well.   To learn more about what you can do  with MyChart, go to [email or web address].    Your next appointment:   4 month(s) WITH Richardson Dopp, [place] on Wednesday, October 5 @ 8:15 am.  The format for your next appointment:   In Person  Provider:   Richardson Dopp, PA-C   Other Instructions  -NONE

## 2021-04-20 ENCOUNTER — Other Ambulatory Visit: Payer: Medicare HMO

## 2021-04-20 ENCOUNTER — Other Ambulatory Visit: Payer: Self-pay

## 2021-04-20 DIAGNOSIS — R072 Precordial pain: Secondary | ICD-10-CM | POA: Diagnosis not present

## 2021-04-20 DIAGNOSIS — I10 Essential (primary) hypertension: Secondary | ICD-10-CM | POA: Diagnosis not present

## 2021-04-20 DIAGNOSIS — I480 Paroxysmal atrial fibrillation: Secondary | ICD-10-CM | POA: Diagnosis not present

## 2021-04-20 DIAGNOSIS — E876 Hypokalemia: Secondary | ICD-10-CM

## 2021-04-20 DIAGNOSIS — I5020 Unspecified systolic (congestive) heart failure: Secondary | ICD-10-CM

## 2021-04-20 LAB — BASIC METABOLIC PANEL
BUN/Creatinine Ratio: 14 (ref 12–28)
BUN: 11 mg/dL (ref 8–27)
CO2: 24 mmol/L (ref 20–29)
Calcium: 9.2 mg/dL (ref 8.7–10.3)
Chloride: 104 mmol/L (ref 96–106)
Creatinine, Ser: 0.76 mg/dL (ref 0.57–1.00)
Glucose: 91 mg/dL (ref 65–99)
Potassium: 4 mmol/L (ref 3.5–5.2)
Sodium: 139 mmol/L (ref 134–144)
eGFR: 87 mL/min/{1.73_m2} (ref >59)

## 2021-04-24 MED ORDER — LOSARTAN POTASSIUM 25 MG PO TABS: 25.0000 mg | ORAL_TABLET | Freq: Every day | ORAL | 11 refills | Status: DC

## 2021-04-24 NOTE — Telephone Encounter (Signed)
I'm surprised her BP is higher on the Spironolactone vs the HCTZ. However, since it is running higher, we should try to add Losartan as this would be helpful in managing her mild cardiomyopathy.  We had not added this in the past b/c her BP was too low. PLAN:  Start Losartan 25 mg once daily  BMET 1 week Monitor BP for 1 week and send readings to me (MyChart is ok). Richardson Dopp, PA-C    04/24/2021 4:09 PM

## 2021-04-24 NOTE — Telephone Encounter (Signed)
Called patient and discussed adding Losartan 25mg  qd. We discussed BP monitoring and how losartan can help lower her BP and help her cardiomyopathy. She agrees to come in for blood work next week and will monitor her BP at home.

## 2021-04-27 ENCOUNTER — Other Ambulatory Visit: Payer: Medicare HMO

## 2021-04-27 NOTE — Telephone Encounter (Addendum)
Called patient and spoke to her in regards to her blood pressure. While attempting to explain patient on when and how to take her blood pressure she interrupted me and became agitated that I was not addressing her concern of her diastolic number being greater than 90. I reassured patient that Nicki Reaper had further recommendations to address this concern. Patient continued to be unpleased with what I was explaining to her. I finally advised her that Nicki Reaper would like her to increase her losartan to 50 mg daily. Patient states she does not understand why we keep changing her medications and not addressing her concerns. I advised her that it can take time for medications to work and can take some adjusting to see what works best for each patient individually. Patient then got upset because I told her that her medication can take time to work however also told her she needed to increase losartan to 50 mg daily. Patient states that she is very upset and told me that I am unable to answer the questions that she needs answered and help her with her blood pressure. She insisted that she will not make any adjustments to medications until she speaks with [facility] directly. Advised patient that I would make Scott aware of our conversation.

## 2021-04-27 NOTE — Telephone Encounter (Signed)
I called pt and left message for her to call back.  This is what I wanted to talk with her about:  We tried switching HCTZ to Spironolactone for 2 reasons - (1) to help avoid low K+ and (2) to maximize her Rx for her cardiomyopathy.    I had not placed her on Losartan in the past b/c her BP was borderline low and did not want to make it lower.  With the recent increase in her BP, I felt that she could now take the Losartan which would also benefit her in terms of managing her cardiomyopathy.  Unfortunately, her BP has been uncontrolled with all the changes and I know frustrating for her.    I reviewed her chart.  She has had low and low normal K+ levels in the past on HCTZ.  I think the 2 options for management are: Continue with Spironolactone and Losartan at 50 mg once daily and monitor BPs over the next week  OR DC Spironolactone; DC Losartan; Resume HCTZ 25 mg once daily along with K+ 10 mEq once daily and recheck a BMET 1 week later.  I am ok with either option.  I would like to do what she is most comfortable with.   Richardson Dopp, PA-C    04/27/2021 1:52 PM

## 2021-04-27 NOTE — Telephone Encounter (Signed)
Please call patient.  Please try to decrease frequency of checking BP. I would recommended checking it 1-2 times a day only and if she is feeling bad.  Check a couple of hours after taking meds in the AM as it will start to increase just before it is time to take meds.  Make sure pt is resting for 20 mins with feet flat on the floor when she checks.  Also, no caffeine prior to checking.   The med added recently will take some time to work.  Losartan 25 mg is a low dose and it looks like she is going to need the usual starting dose. Increase Losartan to 50 mg once daily  She should have repeat BMET coming in the next 1 to 2 weeks.  Keep that appt.  If not, please arrange BMET in 1-2 weeks. Richardson Dopp, PA-C    04/27/2021 8:41 AM

## 2021-04-28 MED ORDER — HYDROCHLOROTHIAZIDE 25 MG PO TABS: 25.0000 mg | ORAL_TABLET | Freq: Every day | ORAL | 3 refills | Status: DC

## 2021-04-28 MED ORDER — POTASSIUM CHLORIDE ER 10 MEQ PO TBCR: 10.0000 meq | EXTENDED_RELEASE_TABLET | Freq: Every day | ORAL | 3 refills | Status: DC

## 2021-04-28 NOTE — Telephone Encounter (Signed)
I spoke with patient and gave her information from [place], [place].  She reports she never took Losartan and does not want to take.  She did not take Spironolactone this morning and is not going to take until she spoke with office.  Patient is asking if potassium she would need to take is the size of potassium she took in the past or if it the size of Atorvastatin.  Chart reviewed and patient previously took 20 meq potassium. I told her 10 meq dose was smaller but larger than atorvastatin.  I explained why potassium would be prescribed.  She is willing to take if needed.  The following options were given by Richardson Dopp, PA   I think the 2 options for management are: Continue with Spironolactone and Losartan at 50 mg once daily and monitor BPs over the next week  OR DC Spironolactone; DC Losartan; Resume HCTZ 25 mg once daily along with K+ 10 mEq once daily and recheck a BMET 1 week later  Patient would like to proceed with option 2.  She is not taking losartan.  Prescriptions sent to [place] [facility].  Patient will come in for lab work on July 8.

## 2021-04-28 NOTE — Addendum Note (Signed)
Addended by: Thompson Grayer on: 04/28/2021 08:52 AM   Modules accepted: Orders

## 2021-05-03 ENCOUNTER — Other Ambulatory Visit: Payer: Medicare HMO

## 2021-05-04 ENCOUNTER — Encounter: Payer: Self-pay | Admitting: Physician Assistant

## 2021-05-05 ENCOUNTER — Other Ambulatory Visit: Payer: Medicare HMO

## 2021-05-08 ENCOUNTER — Other Ambulatory Visit: Payer: Medicare HMO

## 2021-05-08 ENCOUNTER — Other Ambulatory Visit: Payer: Self-pay

## 2021-05-08 DIAGNOSIS — I10 Essential (primary) hypertension: Secondary | ICD-10-CM | POA: Diagnosis not present

## 2021-05-08 DIAGNOSIS — R072 Precordial pain: Secondary | ICD-10-CM

## 2021-05-08 DIAGNOSIS — I480 Paroxysmal atrial fibrillation: Secondary | ICD-10-CM | POA: Diagnosis not present

## 2021-05-08 DIAGNOSIS — I5020 Unspecified systolic (congestive) heart failure: Secondary | ICD-10-CM | POA: Diagnosis not present

## 2021-05-08 DIAGNOSIS — E876 Hypokalemia: Secondary | ICD-10-CM | POA: Diagnosis not present

## 2021-05-08 LAB — BASIC METABOLIC PANEL
BUN/Creatinine Ratio: 20 (ref 12–28)
BUN: 17 mg/dL (ref 8–27)
CO2: 23 mmol/L (ref 20–29)
Calcium: 9.1 mg/dL (ref 8.7–10.3)
Chloride: 102 mmol/L (ref 96–106)
Creatinine, Ser: 0.83 mg/dL (ref 0.57–1.00)
Glucose: 102 mg/dL — ABNORMAL HIGH (ref 65–99)
Potassium: 3.5 mmol/L (ref 3.5–5.2)
Sodium: 139 mmol/L (ref 134–144)
eGFR: 79 mL/min/{1.73_m2} (ref >59)

## 2021-05-09 NOTE — Telephone Encounter (Signed)
Lm to call back ./cy 

## 2021-05-15 ENCOUNTER — Ambulatory Visit: Payer: Medicare HMO | Admitting: Physician Assistant

## 2021-05-26 ENCOUNTER — Other Ambulatory Visit (HOSPITAL_COMMUNITY): Payer: Self-pay | Admitting: Nurse Practitioner

## 2021-05-26 NOTE — Telephone Encounter (Signed)
Prescription refill request for Xarelto received.  Indication:afib  Last office visit: [place], 04/14/2021 Weight: 58.9 kg  Age: 65 yo  Scr: 0.83, 05/08/2021 CrCl:  64 ml/min   Prescription refill sent.

## 2021-05-29 ENCOUNTER — Telehealth: Payer: Self-pay

## 2021-05-29 ENCOUNTER — Other Ambulatory Visit: Payer: Self-pay | Admitting: Family Medicine

## 2021-05-29 NOTE — Telephone Encounter (Signed)
Pt requested samples via MyChart.  Also indicated a need for patient assistance.  2 weeks worth of samples left at front desk for pickup along with information on starting process for financial aid.

## 2021-05-29 NOTE — Telephone Encounter (Signed)
Patient has transferred care to your office.  

## 2021-05-30 ENCOUNTER — Other Ambulatory Visit: Payer: Self-pay | Admitting: *Deleted

## 2021-05-30 DIAGNOSIS — E876 Hypokalemia: Secondary | ICD-10-CM

## 2021-06-08 ENCOUNTER — Other Ambulatory Visit: Payer: Self-pay

## 2021-06-08 ENCOUNTER — Ambulatory Visit (INDEPENDENT_AMBULATORY_CARE_PROVIDER_SITE_OTHER): Payer: Medicare HMO | Admitting: Internal Medicine

## 2021-06-08 ENCOUNTER — Encounter: Payer: Self-pay | Admitting: Internal Medicine

## 2021-06-08 VITALS — BP 110/70 | HR 83 | Temp 98.0°F | Ht 66.0 in | Wt 136.4 lb

## 2021-06-08 DIAGNOSIS — I700 Atherosclerosis of aorta: Secondary | ICD-10-CM | POA: Diagnosis not present

## 2021-06-08 DIAGNOSIS — I480 Paroxysmal atrial fibrillation: Secondary | ICD-10-CM

## 2021-06-08 DIAGNOSIS — G8929 Other chronic pain: Secondary | ICD-10-CM

## 2021-06-08 DIAGNOSIS — Z8673 Personal history of transient ischemic attack (TIA), and cerebral infarction without residual deficits: Secondary | ICD-10-CM | POA: Diagnosis not present

## 2021-06-08 DIAGNOSIS — I10 Essential (primary) hypertension: Secondary | ICD-10-CM | POA: Diagnosis not present

## 2021-06-08 DIAGNOSIS — I5020 Unspecified systolic (congestive) heart failure: Secondary | ICD-10-CM | POA: Diagnosis not present

## 2021-06-08 DIAGNOSIS — I420 Dilated cardiomyopathy: Secondary | ICD-10-CM

## 2021-06-08 DIAGNOSIS — M25561 Pain in right knee: Secondary | ICD-10-CM | POA: Diagnosis not present

## 2021-06-08 DIAGNOSIS — R222 Localized swelling, mass and lump, trunk: Secondary | ICD-10-CM

## 2021-06-08 DIAGNOSIS — M25562 Pain in left knee: Secondary | ICD-10-CM

## 2021-06-08 DIAGNOSIS — E782 Mixed hyperlipidemia: Secondary | ICD-10-CM

## 2021-06-08 DIAGNOSIS — I639 Cerebral infarction, unspecified: Secondary | ICD-10-CM

## 2021-06-08 NOTE — Progress Notes (Signed)
New Patient Office Visit     This visit occurred during the SARS-CoV-2 public health emergency.  Safety protocols were in place, including screening questions prior to the visit, additional usage of staff PPE, and extensive cleaning of exam room while observing appropriate contact time as indicated for disinfecting solutions.    CC/Reason for Visit: Establish care, follow-up chronic medical conditions Previous PCP: [facility] Last Visit: Unknown  HPI: Alicia Bolton is a 65 y.o. female who is coming in today for the above mentioned reasons.  She has a complex past medical history that is significant for hypertension, hyperlipidemia, aortic atherosclerosis, she had a stroke in December 2016 with no large residual deficits.  She also has a history of paroxysmal atrial fibrillation anticoagulated on Xarelto, she also has heart failure with reduced ejection fraction with the most recent EF of around 45% and a dilated cardiomyopathy.  She follows with cardiology routinely.  She also had some kind of benign mass resected from her chest at [facility] about 5 years ago.  She is a English as a second language teacher, she is disabled.  She is a single mother to 1 son.  She was a smoker but quit about 2 years ago in 2020.  She does not drink, she has allergies to penicillin and azithromycin.  Her past surgical history in addition to her chest mass is also positive for bilateral carpal tunnel release surgery, cholecystectomy, C-section, and her anterior neck fusion that she has had twice.  Her family history significant for a mother with type 2 diabetes and a maternal grandmother with colon cancer and a prior stroke.  She complains of multiple aches and pains today particularly of her knees and right hand and is requesting orthopedic referral.  She tells me probably coming her primary next couple months to have her colonoscopy with Dr. Tarri Glenn.  Past Medical/Surgical History: Past Medical History:  Diagnosis Date    Anxiety    takes Ativan daily as needed   Arthritis    Carpal tunnel syndrome 07/17/2013   Chest mass 04/17/2015   Chronic migraine 02/08/2016   DDD (degenerative disc disease), lumbar 04/17/2017   Dilated cardiomyopathy ([place]) 12/09/2019   EF 45-50 by echocardiogram 09/2019 // Myoview 11/2019: EF 46, no ischemia, mild apical thinning artifact; Low Risk    DISORDER, TOBACCO USE 06/27/2007   Qualifier: Diagnosis of  By: Jobe Igo MD, Shanon Brow     Elevated liver function tests 10/04/2014   Fibromyalgia    GAD (generalized anxiety disorder) 10/10/2017   History of bronchitis    few yrs ago   History of colon polyps    benign   History of migraine    last one over a month ago   Hyperlipidemia    takes Atorvastatin daily   Hypertension    takes Clonidine and HCTZ daily   Joint pain    Migraines    Muscle spasms of neck 06/06/2016   Neuromuscular disorder ([place])    Neuropathy 04/25/2016   Nocturia    PAF (paroxysmal atrial fibrillation) ([place]) 09/17/2018   Recurrent boils 04/25/2016   Spinal stenosis of cervical region 10/25/2014   Stroke ([place]) [id number]   Embolic Right ACA,  ILR placed Dr. Rayann Heman.States slight weakness in left   Tear of medial meniscus of right knee 10/09/2013    Past Surgical History:  Procedure Laterality Date   ABDOMINAL HYSTERECTOMY  1997   ANTERIOR CERVICAL DECOMP/DISCECTOMY FUSION N/A 10/25/2014   Procedure: CERVICAL THREE-FOUR, CERVICAL FOUR FIVE ANTERIOR CERVICAL DECOMPRESSION/DISCECTOMY  FUSION 2 LEVEL/HARDWARE REMOVALCERVICAL FIVE-SEVEN;  Surgeon: Elaina Hoops, MD;  Location: [place] [facility];  Service: Neurosurgery;  Laterality: N/A;   BREAST DUCTAL SYSTEM EXCISION Left 11/14/2016   Procedure: EXCISION DUCTAL SYSTEM LEFT BREAST;  Surgeon: Autumn Messing III, MD;  Location: [place];  Service: General;  Laterality: Left;   BREAST EXCISIONAL BIOPSY Right    benign   BREAST EXCISIONAL BIOPSY Left    benign   CARPAL TUNNEL RELEASE Left 10/25/2014   Procedure: CARPAL TUNNEL RELEASE;   Surgeon: Elaina Hoops, MD;  Location: [place] [facility];  Service: Neurosurgery;  Laterality: Left;   CARPAL TUNNEL RELEASE Right    CERVICAL FUSION  2007   CESAREAN SECTION  1985   CHEST SURGERY  04/2015   Benign chest mass removed- [place] N/A 10/06/2015   Procedure: Loop Recorder Insertion;  Surgeon: Thompson Grayer, MD;  Location: [place] [facility];  Service: Cardiovascular;  Laterality: N/A;   implantable loop recorder removal  08/10/2019   Medtr[employer] Reveal LINQ removed in office by Dr Rayann Heman   KNEE ARTHROSCOPY Right 1999   KNEE ARTHROSCOPY WITH MEDIAL MENISECTOMY Right 10/09/2013   Procedure: RIGHT KNEE ARTHROSCOPY WITH MEDIAL MENISECTOMY;  Surgeon: Johnny Bridge, MD;  Location: [place];  Service: Orthopedics;  Laterality: Right;  clean   TEE WITHOUT CARDIOVERSION N/A 10/05/2015   Procedure: TRANSESOPHAGEAL ECHOCARDIOGRAM (TEE);  Surgeon: Thayer Headings, MD;  Location: [facility];  Service: Cardiovascular;  Laterality: N/A;   thumb surgery Right     Social History:  reports that she has been smoking cigarettes. She has never used smokeless tobacco. She reports current alcohol use. She reports that she does not use drugs.  Allergies: Allergies  Allergen Reactions   Azithromycin Nausea And Vomiting   Floxin [Ofloxacin] Nausea And Vomiting    Makes sick to stomach and vomit   Penicillins Rash    Has patient had a PCN reaction causing immediate rash, facial/tongue/throat swelling, SOB or lightheadedness with hypotension: No Has patient had a PCN reaction causing severe rash involving mucus membranes or skin necrosis: No Has patient had a PCN reaction that required hospitalization No Has patient had a PCN reaction occurring within the last 10 years: No If all of the above answers are "NO", then may proceed with Cephalosporin use.    Spironolactone     BP increased; Headaches    Family History:   Family History  Problem Relation Age of Onset   Hypertension Mother    Diabetes Mother    Cancer Maternal Grandmother        colon   Stroke Maternal Grandmother    Colon polyps Maternal Grandmother    Colon cancer Neg Hx    Pancreatic cancer Neg Hx    Esophageal cancer Neg Hx      Current Outpatient Medications:    acyclovir (ZOVIRAX) 400 MG tablet, Take 1 tablet (400 mg total) by mouth 2 (two) times daily., Disp: 180 tablet, Rfl: 3   atorvastatin (LIPITOR) 40 MG tablet, Take 1 tablet (40 mg total) by mouth daily., Disp: 90 tablet, Rfl: 3   cloNIDine (CATAPRES) 0.1 MG tablet, Take 1 tablet (0.1 mg total) by mouth 2 (two) times daily., Disp: 180 tablet, Rfl: 1   clotrimazole (LOTRIMIN) 1 % cream, Apply 1 application topically 2 (two) times daily. To face, Disp: 30 g, Rfl: 1   hydrochlorothiazide (HYDRODIURIL)  25 MG tablet, Take 1 tablet (25 mg total) by mouth daily., Disp: 30 tablet, Rfl: 3   HYDROcodone-acetaminophen (NORCO) 10-325 MG tablet, Take 1 tablet by mouth every 8 (eight) hours as needed. Chronic Pain. Dx: G89.4, Disp: 30 tablet, Rfl: 0   LORazepam (ATIVAN) 0.5 MG tablet, Take 1 tablet (0.5 mg total) by mouth 2 (two) times daily as needed. for anxiety, Disp: 30 tablet, Rfl: 1   metoprolol succinate (TOPROL-XL) 25 MG 24 hr tablet, TAKE 1 TABLET EVERY DAY, Disp: 90 tablet, Rfl: 3   potassium chloride (KLOR-CON) 10 MEQ tablet, Take 1 tablet (10 mEq total) by mouth daily., Disp: 30 tablet, Rfl: 3   rivaroxaban (XARELTO) 20 MG TABS tablet, TAKE 1 TABLET (20 MG TOTAL) BY MOUTH DAILY WITH SUPPER., Disp: 30 tablet, Rfl: 5   triamcinolone cream (KENALOG) 0.1 %, Apply 1 application topically 2 (two) times daily., Disp: 30 g, Rfl: 1   tiZANidine (ZANAFLEX) 4 MG tablet, Take 1 tablet (4 mg total) by mouth every 6 (six) hours as needed for muscle spasms. (Patient not taking: Reported on 06/08/2021), Disp: 30 tablet, Rfl: 1  Review of Systems:  Constitutional: Denies fever, chills,  diaphoresis, appetite change and fatigue.  HEENT: Denies photophobia, eye pain, redness, hearing loss, ear pain, congestion, sore throat, rhinorrhea, sneezing, mouth sores, trouble swallowing, neck pain, neck stiffness and tinnitus.   Respiratory: Denies SOB, DOE, cough, chest tightness,  and wheezing.   Cardiovascular: Denies chest pain, palpitations and leg swelling.  Gastrointestinal: Denies nausea, vomiting, abdominal pain, diarrhea, constipation, blood in stool and abdominal distention.  Genitourinary: Denies dysuria, urgency, frequency, hematuria, flank pain and difficulty urinating.  Endocrine: Denies: hot or cold intolerance, sweats, changes in hair or nails, polyuria, polydipsia. Musculoskeletal: Denies myalgias, back pain, joint swelling, arthralgias and gait problem.  Skin: Denies pallor, rash and wound.  Neurological: Denies dizziness, seizures, syncope, weakness, light-headedness, numbness and headaches.  Hematological: Denies adenopathy. Easy bruising, personal or family bleeding history  Psychiatric/Behavioral: Denies suicidal ideation, mood changes, confusion, nervousness, sleep disturbance and agitation    Physical Exam: Vitals:   06/08/21 1322  BP: 110/70  Pulse: 83  Temp: 98 F (36.7 C)  TempSrc: Oral  SpO2: 98%  Weight: 136 lb 6.4 oz (61.9 kg)  Height: '5\' 6"'$  (1.676 m)   Body mass index is 22.02 kg/m.  Constitutional: NAD, calm, comfortable Eyes: PERRL, lids and conjunctivae normal ENMT: Mucous membranes are moist.  Respiratory: clear to auscultation bilaterally, no wheezing, no crackles. Normal respiratory effort. No accessory muscle use.  Cardiovascular: Regular rate and rhythm, no murmurs / rubs / gallops. No extremity edema.  Neurologic: Grossly intact and nonfocal Psychiatric: Normal judgment and insight. Alert and oriented x 3. Normal mood.    Impression and Plan:  Cryptogenic stroke ([place]) -In December 2016, she does not have a large residual  deficit.  Mixed hyperlipidemia -Last lipid panel in 2020 with a total cholesterol of 137, triglycerides 101 and LDL 80. -She is on atorvastatin 40 mg daily. -Recheck lipids next visit.  Primary hypertension -Well-controlled  HFrEF (heart failure with reduced ejection fraction) (HCC) Dilated cardiomyopathy (HCC) PAF (paroxysmal atrial fibrillation) (HCC) Chest mass Aortic atherosclerosis ([place]) -She follows with cardiology, will check labs when she returns for CPE, her most recent ejection fraction was 45%.  Chronic pain of both knees  - Plan: Ambulatory referral to Orthopedic Surgery per patient request.  Time spent: 45 minutes reviewing chart, interviewing and examining patient and formulating plan of care.  Lelon Frohlich, MD [facility]

## 2021-06-09 ENCOUNTER — Other Ambulatory Visit: Payer: Medicare HMO | Admitting: *Deleted

## 2021-06-09 DIAGNOSIS — E876 Hypokalemia: Secondary | ICD-10-CM

## 2021-06-09 LAB — BASIC METABOLIC PANEL
BUN/Creatinine Ratio: 16 (ref 12–28)
BUN: 14 mg/dL (ref 8–27)
CO2: 25 mmol/L (ref 20–29)
Calcium: 9.2 mg/dL (ref 8.7–10.3)
Chloride: 101 mmol/L (ref 96–106)
Creatinine, Ser: 0.87 mg/dL (ref 0.57–1.00)
Glucose: 93 mg/dL (ref 65–99)
Potassium: 4.3 mmol/L (ref 3.5–5.2)
Sodium: 138 mmol/L (ref 134–144)
eGFR: 74 mL/min/{1.73_m2} (ref >59)

## 2021-06-12 ENCOUNTER — Other Ambulatory Visit (HOSPITAL_COMMUNITY): Payer: Self-pay

## 2021-06-12 ENCOUNTER — Other Ambulatory Visit: Payer: Self-pay | Admitting: *Deleted

## 2021-06-12 ENCOUNTER — Telehealth: Payer: Self-pay | Admitting: Internal Medicine

## 2021-06-12 MED ORDER — RIVAROXABAN 20 MG PO TABS: 20.0000 mg | ORAL_TABLET | Freq: Every day | ORAL | 0 refills | Status: DC

## 2021-06-12 NOTE — Telephone Encounter (Signed)
Patient called regarding Xarelto samples and a paper Rx for Xarelto 30 day supply so that she can use with a local [facility] at a more affordable cost. She states that she has 1 pill left. She states that she spoke with [employer] and they are sending her an application for patient assistance as well. Made patient aware that I have left a 2 week supply of Xarelto and the paper Rx in a bag up front for her to pick up tomorrow.

## 2021-06-12 NOTE — Telephone Encounter (Signed)
Patient calling the office for samples of medication:   1.  What medication and dosage are you requesting samples for? rivaroxaban (XARELTO) 20 MG TABS tablet  2.  Are you currently out of this medication? Yes   

## 2021-06-13 ENCOUNTER — Other Ambulatory Visit (HOSPITAL_COMMUNITY): Payer: Self-pay

## 2021-06-13 MED ORDER — RIVAROXABAN 20 MG PO TABS: 20.0000 mg | ORAL_TABLET | Freq: Every day | ORAL | 0 refills | Status: DC

## 2021-06-26 DIAGNOSIS — L820 Inflamed seborrheic keratosis: Secondary | ICD-10-CM | POA: Diagnosis not present

## 2021-06-26 DIAGNOSIS — D225 Melanocytic nevi of trunk: Secondary | ICD-10-CM | POA: Diagnosis not present

## 2021-06-26 DIAGNOSIS — L821 Other seborrheic keratosis: Secondary | ICD-10-CM | POA: Diagnosis not present

## 2021-06-26 DIAGNOSIS — L218 Other seborrheic dermatitis: Secondary | ICD-10-CM | POA: Diagnosis not present

## 2021-06-27 ENCOUNTER — Ambulatory Visit: Payer: Medicare HMO | Admitting: Physician Assistant

## 2021-07-08 ENCOUNTER — Encounter: Payer: Self-pay | Admitting: Internal Medicine

## 2021-07-11 ENCOUNTER — Other Ambulatory Visit: Payer: Self-pay | Admitting: Internal Medicine

## 2021-07-11 DIAGNOSIS — M2550 Pain in unspecified joint: Secondary | ICD-10-CM

## 2021-07-11 MED ORDER — TIZANIDINE HCL 4 MG PO TABS: 4.0000 mg | ORAL_TABLET | Freq: Four times a day (QID) | ORAL | 1 refills | Status: DC | PRN

## 2021-07-11 MED ORDER — HYDROCODONE-ACETAMINOPHEN 10-325 MG PO TABS
1.0000 | ORAL_TABLET | Freq: Three times a day (TID) | ORAL | 0 refills | Status: DC | PRN
Start: 2021-07-11 — End: 2021-11-07

## 2021-07-11 NOTE — Telephone Encounter (Signed)
Susy Frizzle, MD last filled Hydrocodone.

## 2021-07-15 ENCOUNTER — Telehealth: Payer: Self-pay

## 2021-07-15 NOTE — Telephone Encounter (Signed)
Called and lvm for patient to schedule AWV. Sw, cma

## 2021-08-02 ENCOUNTER — Ambulatory Visit: Payer: Medicare HMO | Admitting: Physician Assistant

## 2021-08-07 ENCOUNTER — Other Ambulatory Visit: Payer: Self-pay | Admitting: Physician Assistant

## 2021-08-08 NOTE — Progress Notes (Signed)
Cardiology Office Note:    Date:  08/09/2021   ID:  Alicia Bolton, DOB January 13, 1956, MRN [id number]  PCP:  Isaac Bliss, Rayford Halsted, MD   [facility] Providers Cardiologist:  Thompson Grayer, MD Cardiology APP:  Sharmon [place]vere     Referring MD: No ref. provider found   Chief Complaint:  F/u for CHF, AFib    Patient Profile:   Alicia Bolton is a 65 y.o. 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 Alicia Bolton was last seen in 6/22 after a visit to the ED with chest pain.  She had no further chest pain and we did not pursue further testing.  I did try to switch her HCTZ to Spironolactone for better BP control in the setting of HFmrEF.  She could not tolerate this and I switched her back to HCTZ.  She returns for f/u.  She denies chest pain, significant shortness of breath, syncope, orthopnea, PND or significant pedal edema.  BPs at home have remained optimal.      Past Medical History:  Diagnosis Date   Anxiety    takes Ativan daily as needed   Arthritis    Carpal tunnel syndrome 07/17/2013   Chest mass 04/17/2015   Chronic migraine 02/08/2016   DDD (degenerative disc disease), lumbar 04/17/2017   Dilated cardiomyopathy ([place]) 12/09/2019   EF 45-50 by echocardiogram 09/2019 // Myoview 11/2019: EF 46, no ischemia, mild  apical thinning artifact; Low Risk    DISORDER, TOBACCO USE 06/27/2007   Qualifier: Diagnosis of  By: Jobe Igo MD, Shanon Brow     Elevated liver function tests 10/04/2014   Fibromyalgia    GAD (generalized anxiety disorder) 10/10/2017   History of bronchitis    few yrs ago   History of colon polyps    benign   History of migraine    last one over a month ago   Hyperlipidemia    takes Atorvastatin daily   Hypertension    takes Clonidine and HCTZ daily   Joint pain    Migraines    Muscle spasms of neck 06/06/2016   Neuromuscular disorder ([place])    Neuropathy 04/25/2016   Nocturia    PAF (paroxysmal atrial fibrillation) ([place]) 09/17/2018   Recurrent boils 04/25/2016   Spinal stenosis of cervical region 10/25/2014   Stroke ([place]) 03/3015   Embolic Right ACA,  ILR placed Dr. Rayann Heman.States slight weakness in left   Tear of medial meniscus of right knee 10/09/2013   Current Medications: Current Meds  Medication Sig   acyclovir (ZOVIRAX) 400 MG tablet Take 1 tablet (400 mg total) by mouth 2 (two) times daily.   Antiseborrheic Products, Misc. (PROMISEB) CREA Apply 1 application topically as needed (for skin breakouts).   atorvastatin (LIPITOR) 40 MG tablet Take 1 tablet (40 mg total) by mouth daily.   cloNIDine (CATAPRES) 0.1 MG tablet Take 1 tablet (0.1 mg total) by  mouth 2 (two) times daily.   clotrimazole (LOTRIMIN) 1 % cream Apply 1 application topically 2 (two) times daily. To face   hydrochlorothiazide (HYDRODIURIL) 25 MG tablet Take 1 tablet (25 mg total) by mouth daily.   HYDROcodone-acetaminophen (NORCO) 10-325 MG tablet Take 1 tablet by mouth every 8 (eight) hours as needed. Chronic Pain. Dx: G89.4   LORazepam (ATIVAN) 0.5 MG tablet Take 1 tablet (0.5 mg total) by mouth 2 (two) times daily as needed. for anxiety   metoprolol succinate (TOPROL-XL) 25 MG 24 hr tablet TAKE 1 TABLET EVERY DAY   rivaroxaban (XARELTO) 20 MG TABS tablet Take 1 tablet (20 mg total) by mouth daily with supper.    tiZANidine (ZANAFLEX) 4 MG tablet Take 1 tablet (4 mg total) by mouth every 6 (six) hours as needed for muscle spasms.   triamcinolone cream (KENALOG) 0.1 % Apply 1 application topically 2 (two) times daily.   [DISCONTINUED] potassium chloride (KLOR-CON) 10 MEQ tablet Take 1 tablet by mouth once daily    Allergies:   Azithromycin, Floxin [ofloxacin], Penicillins, and Spironolactone   Social History   Tobacco Use   Smoking status: Some Days    Types: Cigarettes    Last attempt to quit: 07/07/2014    Years since quitting: 7.0   Smokeless tobacco: Never   Tobacco comments:    may smoke 2 cigarettes per month  Substance Use Topics   Alcohol use: Yes    Comment: occ   Drug use: No    Family Hx: The patient's family history includes Cancer in her maternal grandmother; Colon polyps in her maternal grandmother; Diabetes in her mother; Hypertension in her mother; Stroke in her maternal grandmother. There is no history of Colon cancer, Pancreatic cancer, or Esophageal cancer.  Review of Systems  Respiratory:  Negative for hemoptysis.   Gastrointestinal:  Negative for hematochezia and melena.  Genitourinary:  Negative for hematuria.    EKGs/Labs/Other Test Reviewed:    EKG:  EKG is not ordered today.  The ekg ordered today demonstrates n/a  Recent Labs: 04/09/2021: Hemoglobin 12.7; Platelets 378 06/09/2021: BUN 14; Creatinine, Ser 0.87; Potassium 4.3; Sodium 138   Recent Lipid Panel Lab Results  Component Value Date/Time   CHOL 137 09/18/2019 10:16 AM   TRIG 101 09/18/2019 10:16 AM   HDL 39 (L) 09/18/2019 10:16 AM   LDLCALC 80 09/18/2019 10:16 AM     Risk Assessment/Calculations:    CHA2DS2-VASc Score = 5   This indicates a 7.2% annual risk of stroke. The patient's score is based upon: CHF History: 1 HTN History: 1 Diabetes History: 0 Stroke History: 2 Vascular Disease History: 0 Age Score: 0 Gender Score: 1         Physical Exam:    VS:  BP 120/64   Pulse 98   Ht  5\' 6"  (1.676 m)   Wt 138 lb (62.6 kg)   SpO2 98%   BMI 22.27 kg/m     Wt Readings from Last 3 Encounters:  08/09/21 138 lb (62.6 kg)  06/08/21 136 lb 6.4 oz (61.9 kg)  04/14/21 129 lb 12.8 oz (58.9 kg)    Constitutional:      Appearance: Healthy appearance. Not in distress.  Neck:     Vascular: JVD normal.  Pulmonary:     Effort: Pulmonary effort is normal.     Breath sounds: No wheezing. No rales.  Cardiovascular:     Normal rate. Regular rhythm. Normal S1. Normal S2.  Murmurs: There is no murmur.  Edema:    Peripheral edema absent.  Abdominal:     Palpations: Abdomen is soft. There is no hepatomegaly.  Skin:    General: Skin is warm and dry.  Neurological:     Mental Status: Alert and oriented to person, place and time.     Cranial Nerves: Cranial nerves are intact.       ASSESSMENT & PLAN:   1. HFmrEF (heart failure with mildly reduced EF) EF 45.  Myoview in 2021 suggested non-ischemic cardiomyopathy.  She has not had anginal symptoms.  NYHA II. Volume stable.  She has been on her current regimen of clonidine and HCTZ since her stroke with controlled BP.  I tried to switch HCTZ to Spironolactone but this caused her BP to increase with assoc HAs.  I have avoided adding other GDMT due to well controlled BP.  We discussed the other classes of medications that can be used for HF.  I will see her back in 6 mos with an echocardiogram.  If her EF is lower, I will try to add low dose ACE/ARB.  Continue metoprolol succinate 25 mg once daily.   2. Paroxysmal atrial fibrillation ([place]) She is tolerating Rivaroxaban.  Her most recent Hgb and Creatinine were normal.  Continue Rivaroxaban 20 mg once daily.   3. Essential hypertension BP is controlled.  Continue clonidine 0.1 mg twice daily, HCTZ 25 mg daily, metoprolol succinate 25 mg daily  4. Mixed hyperlipidemia Continue atorvastatin 40 mg daily.  Goal LDL <70.     Dispo:  Return in about 6 months (around 02/07/2022) for  Routine Follow Up, w/ Richardson Dopp, PA-C.   Medication Adjustments/Labs and Tests Ordered: Current medicines are reviewed at length with the patient today.  Concerns regarding medicines are outlined above.  Tests Ordered: Orders Placed This Encounter  Procedures   ECHOCARDIOGRAM COMPLETE    Medication Changes: Meds ordered this encounter  Medications   potassium chloride (KLOR-CON) 10 MEQ tablet    Sig: Take 1 tablet (10 mEq total) by mouth daily.    Dispense:  90 tablet    Refill:  2    Signed, Richardson Dopp, PA-C  08/09/2021 4:46 PM    [facility] [place], [place], [place]  [address] Phone: [phone]; Fax: [phone]

## 2021-08-09 ENCOUNTER — Encounter: Payer: Self-pay | Admitting: Physician Assistant

## 2021-08-09 ENCOUNTER — Other Ambulatory Visit: Payer: Self-pay

## 2021-08-09 ENCOUNTER — Ambulatory Visit: Payer: Medicare HMO | Admitting: Physician Assistant

## 2021-08-09 VITALS — BP 120/64 | HR 98 | Ht 66.0 in | Wt 138.0 lb

## 2021-08-09 DIAGNOSIS — I480 Paroxysmal atrial fibrillation: Secondary | ICD-10-CM

## 2021-08-09 DIAGNOSIS — I5020 Unspecified systolic (congestive) heart failure: Secondary | ICD-10-CM

## 2021-08-09 DIAGNOSIS — I10 Essential (primary) hypertension: Secondary | ICD-10-CM | POA: Diagnosis not present

## 2021-08-09 DIAGNOSIS — E782 Mixed hyperlipidemia: Secondary | ICD-10-CM

## 2021-08-09 MED ORDER — POTASSIUM CHLORIDE ER 10 MEQ PO TBCR: 10.0000 meq | EXTENDED_RELEASE_TABLET | Freq: Every day | ORAL | 2 refills | Status: DC

## 2021-08-09 NOTE — Patient Instructions (Signed)
Medication Instructions:   Your physician recommends that you continue on your current medications as directed. Please refer to the Current Medication list given to you today.  *If you need a refill on your cardiac medications before your next appointment, please call your pharmacy*   Lab Work:  -NONE-  If you have labs (blood work) drawn today and your tests are completely normal, you will receive your results only by: [place] (if you have MyChart) OR A paper copy in the mail If you have any lab test that is abnormal or we need to change your treatment, we will call you to review the results.   Testing/Procedures:  Your physician has requested that you have an echocardiogram. Echocardiography is a painless test that uses sound waves to create images of your heart. It provides your doctor with information about the size and shape of your heart and how well your heart's chambers and valves are working. This procedure takes approximately one hour. There are no restrictions for this procedure.    Follow-Up: At [facility], you and your health needs are our priority.  As part of our continuing mission to provide you with exceptional heart care, we have created designated Provider Care Teams.  These Care Teams include your primary Cardiologist (physician) and Advanced Practice Providers (APPs -  Physician Assistants and Nurse Practitioners) who all work together to provide you with the care you need, when you need it.  We recommend signing up for the patient portal called "MyChart".  Sign up information is provided on this After Visit Summary.  MyChart is used to connect with patients for Virtual Visits (Telemedicine).  Patients are able to view lab/test results, encounter notes, upcoming appointments, etc.  Non-urgent messages can be sent to your provider as well.   To learn more about what you can do with MyChart, go to [email or web address].    Your next appointment:   6  month(s)  The format for your next appointment:   In Person  Provider:   Richardson Dopp, PA-C   Other Instructions Your physician wants you to follow-up in: 6 months with Richardson Dopp, PA-C.  You will receive a reminder letter in the mail two months in advance. If you don't receive a letter, please call our office to schedule the follow-up appointment.

## 2021-09-07 ENCOUNTER — Encounter: Payer: Medicare HMO | Admitting: Internal Medicine

## 2021-10-13 ENCOUNTER — Telehealth: Payer: Self-pay | Admitting: Physician Assistant

## 2021-10-13 NOTE — Telephone Encounter (Signed)
Pt advised.  She will check her BP when she gets home and take her medication if SBP >100  Pt thanked nurse for quick call back.

## 2021-10-13 NOTE — Telephone Encounter (Signed)
°  Pt c/o medication issue:  1. Name of Medication: hydrochlorothiazide (HYDRODIURIL) 25 MG tablet  2. How are you currently taking this medication (dosage and times per day)? Take 1 tablet (25 mg total) by mouth daily.  3. Are you having a reaction (difficulty breathing--STAT)?   4. What is your medication issue? Pt accidentally took 2 of the hydrochlorothiazide, she has not taken her clonidine, she doesn't know if she needs to take it or wait since she took 2 hydrochlorothiazide

## 2021-10-13 NOTE — Telephone Encounter (Signed)
If she has a BP cuff, she can check her BP. Unless her BP is < 100, she should take the Clonidine.  Stopping Clonidine can make the BP go up significantly. Richardson Dopp, PA-C    10/13/2021 10:02 AM

## 2021-11-05 ENCOUNTER — Encounter: Payer: Self-pay | Admitting: Internal Medicine

## 2021-11-05 DIAGNOSIS — M2550 Pain in unspecified joint: Secondary | ICD-10-CM

## 2021-11-05 DIAGNOSIS — I639 Cerebral infarction, unspecified: Secondary | ICD-10-CM

## 2021-11-05 DIAGNOSIS — L732 Hidradenitis suppurativa: Secondary | ICD-10-CM

## 2021-11-06 ENCOUNTER — Encounter: Payer: Self-pay | Admitting: Internal Medicine

## 2021-11-06 MED ORDER — HYDROCHLOROTHIAZIDE 25 MG PO TABS: 25.0000 mg | ORAL_TABLET | Freq: Every day | ORAL | 2 refills | Status: DC

## 2021-11-06 MED ORDER — POTASSIUM CHLORIDE ER 10 MEQ PO TBCR: 10.0000 meq | EXTENDED_RELEASE_TABLET | Freq: Every day | ORAL | 2 refills | Status: DC

## 2021-11-06 MED ORDER — METOPROLOL SUCCINATE ER 25 MG PO TB24: 25.0000 mg | ORAL_TABLET | Freq: Every day | ORAL | 2 refills | Status: DC

## 2021-11-06 MED ORDER — RIVAROXABAN 20 MG PO TABS: 20.0000 mg | ORAL_TABLET | Freq: Every day | ORAL | 1 refills | Status: DC

## 2021-11-06 NOTE — Telephone Encounter (Signed)
Pt's medication Metoprolol, potassium and hydrochlorothiazide was sent to pt's new pharmacy [facility]. Confirmation received.

## 2021-11-06 NOTE — Telephone Encounter (Signed)
Pt last saw Richardson Dopp, PA on 08/09/21, last labs 06/09/21 Creat 0.87, age 66, weight 62.6kg, CrCl 64.56, based on CrCl pt is on appropriate dosage of Xarelto 20mg  QD for afib.  Will refill rx.

## 2021-11-07 ENCOUNTER — Encounter: Payer: Self-pay | Admitting: Internal Medicine

## 2021-11-07 MED ORDER — ACYCLOVIR 400 MG PO TABS: 400.0000 mg | ORAL_TABLET | Freq: Two times a day (BID) | ORAL | 1 refills | Status: DC

## 2021-11-07 MED ORDER — HYDROCODONE-ACETAMINOPHEN 10-325 MG PO TABS: 1.0000 | ORAL_TABLET | Freq: Three times a day (TID) | ORAL | 0 refills | Status: DC | PRN

## 2021-11-07 MED ORDER — CLONIDINE HCL 0.1 MG PO TABS: 0.1000 mg | ORAL_TABLET | Freq: Two times a day (BID) | ORAL | 1 refills | Status: DC

## 2021-11-07 MED ORDER — ATORVASTATIN CALCIUM 40 MG PO TABS: 40.0000 mg | ORAL_TABLET | Freq: Every day | ORAL | 1 refills | Status: DC

## 2021-11-07 NOTE — Addendum Note (Signed)
Addended by: Elza Rafter D on: 11/07/2021 10:40 AM   Modules accepted: Orders

## 2021-11-07 NOTE — Telephone Encounter (Signed)
Last refill of hydrocodone per controlled substance database: 07/11/21 Last Ov: 06/08/21 Next OV 12/07/21

## 2021-11-08 ENCOUNTER — Encounter: Payer: Self-pay | Admitting: Internal Medicine

## 2021-11-15 ENCOUNTER — Other Ambulatory Visit: Payer: Self-pay | Admitting: Internal Medicine

## 2021-11-15 DIAGNOSIS — Z1231 Encounter for screening mammogram for malignant neoplasm of breast: Secondary | ICD-10-CM

## 2021-11-29 ENCOUNTER — Ambulatory Visit: Payer: Medicare HMO

## 2021-12-05 ENCOUNTER — Ambulatory Visit
Admission: RE | Admit: 2021-12-05 | Discharge: 2021-12-05 | Disposition: A | Discharge status: Home / Self Care | Payer: No Typology Code available for payment source | Source: Ambulatory Visit | Attending: Internal Medicine | Admitting: Internal Medicine

## 2021-12-05 DIAGNOSIS — Z1231 Encounter for screening mammogram for malignant neoplasm of breast: Secondary | ICD-10-CM

## 2021-12-07 ENCOUNTER — Encounter: Payer: Self-pay | Admitting: Internal Medicine

## 2021-12-07 ENCOUNTER — Ambulatory Visit (INDEPENDENT_AMBULATORY_CARE_PROVIDER_SITE_OTHER): Payer: No Typology Code available for payment source | Admitting: Internal Medicine

## 2021-12-07 VITALS — BP 102/72 | HR 88 | Temp 98.1°F | Ht 65.0 in | Wt 143.5 lb

## 2021-12-07 DIAGNOSIS — Z1211 Encounter for screening for malignant neoplasm of colon: Secondary | ICD-10-CM | POA: Diagnosis not present

## 2021-12-07 DIAGNOSIS — I480 Paroxysmal atrial fibrillation: Secondary | ICD-10-CM | POA: Diagnosis not present

## 2021-12-07 DIAGNOSIS — I700 Atherosclerosis of aorta: Secondary | ICD-10-CM

## 2021-12-07 DIAGNOSIS — Z0000 Encounter for general adult medical examination without abnormal findings: Secondary | ICD-10-CM

## 2021-12-07 DIAGNOSIS — I5020 Unspecified systolic (congestive) heart failure: Secondary | ICD-10-CM

## 2021-12-07 DIAGNOSIS — E782 Mixed hyperlipidemia: Secondary | ICD-10-CM

## 2021-12-07 DIAGNOSIS — I420 Dilated cardiomyopathy: Secondary | ICD-10-CM

## 2021-12-07 DIAGNOSIS — I10 Essential (primary) hypertension: Secondary | ICD-10-CM

## 2021-12-07 LAB — CBC WITH DIFFERENTIAL/PLATELET
Basophils Absolute: 0 10*3/uL (ref 0.0–0.1)
Basophils Relative: 0.3 % (ref 0.0–3.0)
Eosinophils Absolute: 0.1 10*3/uL (ref 0.0–0.7)
Eosinophils Relative: 1 % (ref 0.0–5.0)
HCT: 36.4 % (ref 36.0–46.0)
Hemoglobin: 12.4 g/dL (ref 12.0–15.0)
Lymphocytes Relative: 34.6 % (ref 12.0–46.0)
Lymphs Abs: 2.6 10*3/uL (ref 0.7–4.0)
MCHC: 34.1 g/dL (ref 30.0–36.0)
MCV: 94.4 fl (ref 78.0–100.0)
Monocytes Absolute: 0.7 10*3/uL (ref 0.1–1.0)
Monocytes Relative: 9.2 % (ref 3.0–12.0)
Neutro Abs: 4.2 10*3/uL (ref 1.4–7.7)
Neutrophils Relative %: 54.9 % (ref 43.0–77.0)
Platelets: 371 10*3/uL (ref 150.0–400.0)
RBC: 3.86 Mil/uL — ABNORMAL LOW (ref 3.87–5.11)
RDW: 13.9 % (ref 11.5–15.5)
WBC: 7.6 10*3/uL (ref 4.0–10.5)

## 2021-12-07 LAB — COMPREHENSIVE METABOLIC PANEL
ALT: 10 U/L (ref 0–35)
AST: 14 U/L (ref 0–37)
Albumin: 3.8 g/dL (ref 3.5–5.2)
Alkaline Phosphatase: 85 U/L (ref 39–117)
BUN: 13 mg/dL (ref 6–23)
CO2: 31 mEq/L (ref 19–32)
Calcium: 9.1 mg/dL (ref 8.4–10.5)
Chloride: 101 mEq/L (ref 96–112)
Creatinine, Ser: 0.85 mg/dL (ref 0.40–1.20)
GFR: 72.23 mL/min (ref >60.00)
Glucose, Bld: 96 mg/dL (ref 70–99)
Potassium: 3.1 mEq/L — ABNORMAL LOW (ref 3.5–5.1)
Sodium: 139 mEq/L (ref 135–145)
Total Bilirubin: 0.8 mg/dL (ref 0.2–1.2)
Total Protein: 7.7 g/dL (ref 6.0–8.3)

## 2021-12-07 LAB — HEMOGLOBIN A1C: Hgb A1c MFr Bld: 6.1 % (ref 4.6–6.5)

## 2021-12-07 LAB — LIPID PANEL
Cholesterol: 128 mg/dL (ref 0–200)
HDL: 38.1 mg/dL — ABNORMAL LOW (ref >39.00)
LDL Cholesterol: 73 mg/dL (ref 0–99)
NonHDL: 90.05
Total CHOL/HDL Ratio: 3
Triglycerides: 83 mg/dL (ref 0.0–149.0)
VLDL: 16.6 mg/dL (ref 0.0–40.0)

## 2021-12-07 NOTE — Progress Notes (Signed)
Established Patient Office Visit     This visit occurred during the SARS-CoV-2 public health emergency.  Safety protocols were in place, including screening questions prior to the visit, additional usage of staff PPE, and extensive cleaning of exam room while observing appropriate contact time as indicated for disinfecting solutions.    CC/Reason for Visit: Annual preventive exam  HPI: Alicia Bolton is a 66 y.o. female who is coming in today for the above mentioned reasons. Past Medical History is significant for:  hypertension, hyperlipidemia, aortic atherosclerosis, she had a stroke in December 2016 with no large residual deficits.  She also has a history of paroxysmal atrial fibrillation anticoagulated on Xarelto, she also has heart failure with reduced ejection fraction with the most recent EF of around 45% and a dilated cardiomyopathy.  She follows with cardiology routinely.  She also had some kind of benign mass resected from her chest at [facility] about 5 years ago.  She has no acute concerns or complaints.  She had her mammogram last week, she would like to schedule her colonoscopy, she was told by her GYN she no longer needed Pap smears since she had a total hysterectomy in the 90s.  She is scheduled to see a dermatologist next month.  She has routine eye and dental care.  No perceived hearing issues.  She follows with her cardiologist routinely.  All immunizations are up-to-date except for shingles.   Past Medical/Surgical History: Past Medical History:  Diagnosis Date   Anxiety    takes Ativan daily as needed   Arthritis    Carpal tunnel syndrome 07/17/2013   Chest mass 04/17/2015   Chronic migraine 02/08/2016   DDD (degenerative disc disease), lumbar 04/17/2017   Dilated cardiomyopathy ([place]) 12/09/2019   EF 45-50 by echocardiogram 09/2019 // Myoview 11/2019: EF 46, no ischemia, mild apical thinning artifact; Low Risk    DISORDER, TOBACCO USE 06/27/2007   Qualifier: Diagnosis  of  By: Jobe Igo MD, Shanon Brow     Elevated liver function tests 10/04/2014   Fibromyalgia    GAD (generalized anxiety disorder) 10/10/2017   History of bronchitis    few yrs ago   History of colon polyps    benign   History of migraine    last one over a month ago   Hyperlipidemia    takes Atorvastatin daily   Hypertension    takes Clonidine and HCTZ daily   Joint pain    Migraines    Muscle spasms of neck 06/06/2016   Neuromuscular disorder ([place])    Neuropathy 04/25/2016   Nocturia    PAF (paroxysmal atrial fibrillation) ([place]) 09/17/2018   Recurrent boils 04/25/2016   Spinal stenosis of cervical region 10/25/2014   Stroke ([place]) 05/6577   Embolic Right ACA,  ILR placed Dr. Rayann Heman.States slight weakness in left   Tear of medial meniscus of right knee 10/09/2013    Past Surgical History:  Procedure Laterality Date   ABDOMINAL HYSTERECTOMY  1997   ANTERIOR CERVICAL DECOMP/DISCECTOMY FUSION N/A 10/25/2014   Procedure: CERVICAL THREE-FOUR, CERVICAL FOUR FIVE ANTERIOR CERVICAL DECOMPRESSION/DISCECTOMY FUSION 2 LEVEL/HARDWARE REMOVALCERVICAL FIVE-SEVEN;  Surgeon: Elaina Hoops, MD;  Location: [facility];  Service: Neurosurgery;  Laterality: N/A;   BREAST DUCTAL SYSTEM EXCISION Left 11/14/2016   Procedure: EXCISION DUCTAL SYSTEM LEFT BREAST;  Surgeon: Autumn Messing III, MD;  Location: [facility];  Service: General;  Laterality: Left;   BREAST EXCISIONAL BIOPSY Right    benign   BREAST EXCISIONAL BIOPSY Left  benign   CARPAL TUNNEL RELEASE Left 10/25/2014   Procedure: CARPAL TUNNEL RELEASE;  Surgeon: Elaina Hoops, MD;  Location: [facility];  Service: Neurosurgery;  Laterality: Left;   CARPAL TUNNEL RELEASE Right    CERVICAL FUSION  2007   CESAREAN SECTION  1985   CHEST SURGERY  04/2015   Benign chest mass removed- [place] N/A 10/06/2015   Procedure: Loop Recorder Insertion;  Surgeon: Thompson Grayer, MD;  Location: [facility] CV  LAB;  Service: Cardiovascular;  Laterality: N/A;   implantable loop recorder removal  08/10/2019   [employer]tr[employer] Reveal LINQ removed in office by Dr Rayann Heman   KNEE ARTHROSCOPY Right 1999   KNEE ARTHROSCOPY WITH MEDIAL MENISECTOMY Right 10/09/2013   Procedure: RIGHT KNEE ARTHROSCOPY WITH MEDIAL MENISECTOMY;  Surgeon: Johnny Bridge, MD;  Location: [facility];  Service: Orthopedics;  Laterality: Right;  clean   TEE WITHOUT CARDIOVERSION N/A 10/05/2015   Procedure: TRANSESOPHAGEAL ECHOCARDIOGRAM (TEE);  Surgeon: Thayer Headings, MD;  Location: [facility];  Service: Cardiovascular;  Laterality: N/A;   thumb surgery Right     Social History:  reports that she has been smoking cigarettes. She has never used smokeless tobacco. She reports current alcohol use. She reports that she does not use drugs.  Allergies: Allergies  Allergen Reactions   Azithromycin Nausea And Vomiting   Floxin [Ofloxacin] Nausea And Vomiting    Makes sick to stomach and vomit   Penicillins Rash    Has patient had a PCN reaction causing immediate rash, facial/tongue/throat swelling, SOB or lightheadedness with hypotension: No Has patient had a PCN reaction causing severe rash involving mucus membranes or skin necrosis: No Has patient had a PCN reaction that required hospitalization No Has patient had a PCN reaction occurring within the last 10 years: No If all of the above answers are "NO", then may proceed with Cephalosporin use.    Spironolactone     BP increased; Headaches    Family History:  Family History  Problem Relation Age of Onset   Hypertension Mother    Diabetes Mother    Cancer Maternal Grandmother        colon   Stroke Maternal Grandmother    Colon polyps Maternal Grandmother    Colon cancer Neg Hx    Pancreatic cancer Neg Hx    Esophageal cancer Neg Hx      Current Outpatient Medications:    acyclovir (ZOVIRAX) 400 MG tablet, Take 1 tablet (400 mg total) by mouth 2 (two) times  daily., Disp: 180 tablet, Rfl: 1   Antiseborrheic Products, Misc. (PROMISEB) CREA, Apply 1 application topically as needed (for skin breakouts)., Disp: , Rfl:    atorvastatin (LIPITOR) 40 MG tablet, Take 1 tablet (40 mg total) by mouth daily., Disp: 90 tablet, Rfl: 1   cloNIDine (CATAPRES) 0.1 MG tablet, Take 1 tablet (0.1 mg total) by mouth 2 (two) times daily., Disp: 180 tablet, Rfl: 1   clotrimazole (LOTRIMIN) 1 % cream, Apply 1 application topically 2 (two) times daily. To face, Disp: 30 g, Rfl: 1   hydrochlorothiazide (HYDRODIURIL) 25 MG tablet, Take 1 tablet (25 mg total) by mouth daily., Disp: 90 tablet, Rfl: 2   HYDROcodone-acetaminophen (NORCO) 10-325 MG tablet, Take 1 tablet by mouth every 8 (eight) hours as needed. Chronic Pain. Dx: G89.4, Disp: 30 tablet, Rfl: 0   LORazepam (ATIVAN) 0.5 MG tablet, Take 1  tablet (0.5 mg total) by mouth 2 (two) times daily as needed. for anxiety, Disp: 30 tablet, Rfl: 1   metoprolol succinate (TOPROL-XL) 25 MG 24 hr tablet, Take 1 tablet (25 mg total) by mouth daily., Disp: 90 tablet, Rfl: 2   potassium chloride (KLOR-CON) 10 MEQ tablet, Take 1 tablet (10 mEq total) by mouth daily., Disp: 90 tablet, Rfl: 2   rivaroxaban (XARELTO) 20 MG TABS tablet, Take 1 tablet (20 mg total) by mouth daily with supper., Disp: 90 tablet, Rfl: 1   tiZANidine (ZANAFLEX) 4 MG tablet, Take 1 tablet (4 mg total) by mouth every 6 (six) hours as needed for muscle spasms., Disp: 30 tablet, Rfl: 1   triamcinolone cream (KENALOG) 0.1 %, Apply 1 application topically 2 (two) times daily., Disp: 30 g, Rfl: 1  Review of Systems:  Constitutional: Denies fever, chills, diaphoresis, appetite change and fatigue.  HEENT: Denies photophobia, eye pain, redness, hearing loss, ear pain, congestion, sore throat, rhinorrhea, sneezing, mouth sores, trouble swallowing, neck pain, neck stiffness and tinnitus.   Respiratory: Denies SOB, DOE, cough, chest tightness,  and wheezing.   Cardiovascular:  Denies chest pain, palpitations and leg swelling.  Gastrointestinal: Denies nausea, vomiting, abdominal pain, diarrhea, constipation, blood in stool and abdominal distention.  Genitourinary: Denies dysuria, urgency, frequency, hematuria, flank pain and difficulty urinating.  Endocrine: Denies: hot or cold intolerance, sweats, changes in hair or nails, polyuria, polydipsia. Musculoskeletal: Denies myalgias, back pain, joint swelling, arthralgias and gait problem.  Skin: Denies pallor, rash and wound.  Neurological: Denies dizziness, seizures, syncope, weakness, light-headedness, numbness and headaches.  Hematological: Denies adenopathy. Easy bruising, personal or family bleeding history  Psychiatric/Behavioral: Denies suicidal ideation, mood changes, confusion, nervousness, sleep disturbance and agitation    Physical Exam: Vitals:   12/07/21 0839  BP: 102/72  Pulse: 88  Temp: 98.1 F (36.7 C)  TempSrc: Oral  SpO2: 98%  Weight: 143 lb 8 oz (65.1 kg)  Height: 5\' 5"  (1.651 m)    Body mass index is 23.88 kg/m.   Constitutional: NAD, calm, comfortable Eyes: PERRL, lids and conjunctivae normal ENMT: Mucous membranes are moist. Posterior pharynx clear of any exudate or lesions. Normal dentition. Tympanic membrane is pearly white, no erythema or bulging. Neck: normal, supple, no masses, no thyromegaly Respiratory: clear to auscultation bilaterally, no wheezing, no crackles. Normal respiratory effort. No accessory muscle use.  Cardiovascular: Regular rate and rhythm, no murmurs / rubs / gallops. No extremity edema. 2+ pedal pulses. No carotid bruits.  Abdomen: no tenderness, no masses palpated. No hepatosplenomegaly. Bowel sounds positive.  Musculoskeletal: no clubbing / cyanosis. No joint deformity upper and lower extremities. Good ROM, no contractures. Normal muscle tone.  Skin: no rashes, lesions, ulcers. No induration Neurologic: CN 2-12 grossly intact. Sensation intact, DTR normal.  Strength 5/5 in all 4.  Psychiatric: Normal judgment and insight. Alert and oriented x 3. Normal mood.    Impression and Plan:  Screening for malignant neoplasm of colon  - Plan: Ambulatory referral to Gastroenterology  Encounter for preventive health examination -Recommend routine eye and dental care. -Immunizations: All immunizations are up-to-date with the exception of shingles which she declines -Healthy lifestyle discussed in detail. -Labs to be updated today. -Colon cancer screening: Referral placed today, overdue -Breast cancer screening: 11/2021 -Cervical cancer screening: Not applicable -Lung cancer screening: Not applicable -Prostate cancer screening: Not applicable -DEXA: Not applicable  Dilated cardiomyopathy ([place]) HFrEF (heart failure with reduced ejection fraction) ([place])  - Plan: CBC with Differential/Platelet, Comprehensive metabolic panel,  Hemoglobin A1c -Compensated, followed by cardiology.  PAF (paroxysmal atrial fibrillation) (HCC) -Anticoagulated on Xarelto.  Mixed hyperlipidemia  - Plan: Lipid panel -On atorvastatin 40 mg daily.  Primary hypertension -Blood pressures well controlled.  Aortic atherosclerosis (HCC) -Noted, on statin    Patient Instructions  -Nice seeing you today!!  -Lab work today; will notify you once results are available.  -Schedule follow up in 6 months.      Lelon Frohlich, MD [facility]

## 2021-12-07 NOTE — Telephone Encounter (Signed)
error 

## 2021-12-07 NOTE — Patient Instructions (Signed)
-  Nice seeing you today!!  -Lab work today; will notify you once results are available.  -Schedule follow up in 6 months. 

## 2021-12-30 ENCOUNTER — Encounter: Payer: Self-pay | Admitting: Internal Medicine

## 2022-01-01 MED ORDER — CLONIDINE HCL 0.1 MG PO TABS: 0.1000 mg | ORAL_TABLET | Freq: Two times a day (BID) | ORAL | 1 refills | Status: DC

## 2022-01-17 DIAGNOSIS — H524 Presbyopia: Secondary | ICD-10-CM | POA: Diagnosis not present

## 2022-01-17 DIAGNOSIS — Z0100 Encounter for examination of eyes and vision without abnormal findings: Secondary | ICD-10-CM | POA: Diagnosis not present

## 2022-01-27 ENCOUNTER — Encounter: Payer: Self-pay | Admitting: Internal Medicine

## 2022-01-29 MED ORDER — POTASSIUM CHLORIDE ER 10 MEQ PO TBCR: 10.0000 meq | EXTENDED_RELEASE_TABLET | Freq: Every day | ORAL | 0 refills | Status: DC

## 2022-01-29 NOTE — Addendum Note (Signed)
Addended by: Westley Hummer B on: 01/29/2022 07:32 AM ? ? Modules accepted: Orders ? ?

## 2022-01-30 ENCOUNTER — Ambulatory Visit (HOSPITAL_COMMUNITY): Payer: No Typology Code available for payment source | Attending: Cardiology

## 2022-01-30 ENCOUNTER — Encounter: Payer: Self-pay | Admitting: Physician Assistant

## 2022-01-30 DIAGNOSIS — I10 Essential (primary) hypertension: Secondary | ICD-10-CM | POA: Diagnosis not present

## 2022-01-30 DIAGNOSIS — I5031 Acute diastolic (congestive) heart failure: Secondary | ICD-10-CM | POA: Diagnosis not present

## 2022-01-30 DIAGNOSIS — E782 Mixed hyperlipidemia: Secondary | ICD-10-CM | POA: Insufficient documentation

## 2022-01-30 DIAGNOSIS — I5020 Unspecified systolic (congestive) heart failure: Secondary | ICD-10-CM | POA: Diagnosis not present

## 2022-01-30 DIAGNOSIS — I480 Paroxysmal atrial fibrillation: Secondary | ICD-10-CM | POA: Diagnosis not present

## 2022-01-30 LAB — ECHOCARDIOGRAM COMPLETE
Area-P 1/2: 3.85 cm2
S' Lateral: 3.2 cm

## 2022-02-17 ENCOUNTER — Encounter: Payer: Self-pay | Admitting: Internal Medicine

## 2022-02-20 ENCOUNTER — Ambulatory Visit: Payer: No Typology Code available for payment source | Admitting: Physician Assistant

## 2022-03-20 NOTE — Telephone Encounter (Signed)
Let's DC Metoprolol Succinate. Start Carvedilol 3.125 mg twice daily. Contact me if hair loss continues. Richardson Dopp, PA-C    03/20/2022 5:00 PM

## 2022-03-21 NOTE — Telephone Encounter (Signed)
Call placed to pt regarding mychart message below. Per pt, she doesn't want to make any changes at this moment, that she is scheduled to come in on June 6th and this can be discussed at that time.  I advised pt I would let provider know.

## 2022-04-02 DIAGNOSIS — I5020 Unspecified systolic (congestive) heart failure: Secondary | ICD-10-CM | POA: Insufficient documentation

## 2022-04-02 NOTE — Progress Notes (Unsigned)
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 Echocardiogram 09/2019: EF 45-50, GLS -20.4 Myoview 2/21: EF 46, no ischemia; low risk Intol of Spironolactone  Echo 01/2022: EF 45-50, GLS -15.2 Hyperlipidemia Hypertension 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 female with the above problem list.  She was last seen in October 2022.  She had a follow-up echocardiogram that demonstrated stable EF.  GLS was lower at -15.2.  She did report recent symptoms of alopecia possibly related to beta-blocker therapy.  She returns for follow-up.   She is here alone.  Overall, she has been doing well.  She notes that her hair loss has been global and she has noticed significant thinning in the last several months.  She has not had chest pain, significant shortness of breath, orthopnea, leg edema, syncope.    Past Medical History:  Diagnosis Date   Anxiety    takes Ativan daily as needed    Arthritis    Carpal tunnel syndrome 07/17/2013   Chest mass 04/17/2015   Chronic migraine 02/08/2016   DDD (degenerative disc disease), lumbar 04/17/2017   Dilated cardiomyopathy ([place]) 12/09/2019   EF 45-50 by echocardiogram // Myoview 11/2019: EF 46, no ischemia, mild apical thinning artifact; Low Risk // Echo 4/23: EF 45-50, no RWMA, GR 1 DD, GLS -15.2, normal RVSF, normal PASP, RVSP 25.3, mild MR, AV sclerosis without stenosis   DISORDER, TOBACCO USE 06/27/2007   Qualifier: Diagnosis of  By: Jobe Igo MD, David     Elevated liver function tests 10/04/2014   Fibromyalgia    GAD (generalized anxiety disorder) 10/10/2017   History of bronchitis    few yrs ago   History of colon polyps    benign   History of migraine    last one over a month ago   Hyperlipidemia    takes Atorvastatin daily   Hypertension    takes Clonidine and HCTZ daily   Joint pain    Migraines    Muscle spasms of neck 06/06/2016   Neuromuscular disorder ([place])    Neuropathy 04/25/2016   Nocturia    PAF (paroxysmal atrial fibrillation) ([place]) 09/17/2018   Recurrent boils 04/25/2016   Spinal stenosis of cervical region 10/25/2014   Stroke ([place]) 36/1443   Embolic Right ACA,  ILR placed Dr. Rayann Heman.States slight weakness in left   Tear of medial meniscus of right knee 10/09/2013   Current Medications: Current Meds  Medication Sig   acyclovir (ZOVIRAX) 400 MG tablet Take 1 tablet (  400 mg total) by mouth 2 (two) times daily.   Antiseborrheic Products, Misc. (PROMISEB) CREA Apply 1 application topically as needed (for skin breakouts).   atorvastatin (LIPITOR) 40 MG tablet Take 1 tablet (40 mg total) by mouth daily.   cloNIDine (CATAPRES) 0.1 MG tablet Take 1 tablet (0.1 mg total) by mouth 2 (two) times daily.   clotrimazole (LOTRIMIN) 1 % cream Apply 1 application topically 2 (two) times daily. To face   hydrochlorothiazide (HYDRODIURIL) 25 MG tablet Take 1 tablet (25 mg total) by mouth daily.    HYDROcodone-acetaminophen (NORCO) 10-325 MG tablet Take 1 tablet by mouth every 8 (eight) hours as needed. Chronic Pain. Dx: G89.4   LORazepam (ATIVAN) 0.5 MG tablet Take 1 tablet (0.5 mg total) by mouth 2 (two) times daily as needed. for anxiety   losartan (COZAAR) 25 MG tablet Take 1 tablet (25 mg total) by mouth daily.   metoprolol succinate (TOPROL XL) 25 MG 24 hr tablet Take 1/2 tablet by mouth daily X's 3 days then stop it.   potassium chloride (KLOR-CON) 10 MEQ tablet Take 1 tablet (10 mEq total) by mouth daily.   rivaroxaban (XARELTO) 20 MG TABS tablet Take 1 tablet (20 mg total) by mouth daily with supper.   tiZANidine (ZANAFLEX) 4 MG tablet Take 1 tablet (4 mg total) by mouth every 6 (six) hours as needed for muscle spasms.   triamcinolone cream (KENALOG) 0.1 % Apply 1 application topically 2 (two) times daily.   [DISCONTINUED] metoprolol succinate (TOPROL-XL) 25 MG 24 hr tablet Take 1 tablet (25 mg total) by mouth daily.    Allergies:   Azithromycin, Floxin [ofloxacin], Penicillin g, Penicillins, and Spironolactone   Social History   Tobacco Use   Smoking status: Some Days    Types: Cigarettes    Last attempt to quit: 07/07/2014    Years since quitting: 7.7   Smokeless tobacco: Never   Tobacco comments:    may smoke 2 cigarettes per month  Substance Use Topics   Alcohol use: Yes    Comment: occ   Drug use: No    Family Hx: The patient's family history includes Cancer in her maternal grandmother; Colon polyps in her maternal grandmother; Diabetes in her mother; Hypertension in her mother; Stroke in her maternal grandmother. There is no history of Colon cancer, Pancreatic cancer, or Esophageal cancer.  Review of Systems  Gastrointestinal:  Negative for hematochezia.  Genitourinary:  Negative for hematuria.    EKGs/Labs/Other Test Reviewed:    EKG:  EKG is  ordered today.  The ekg ordered today demonstrates NSR, HR 74, left axis deviation, no ST-T wave changes, QTc  415  Recent Labs: 12/07/2021: ALT 10; BUN 13; Creatinine, Ser 0.85; Hemoglobin 12.4; Platelets 371.0; Potassium 3.1; Sodium 139   Recent Lipid Panel Recent Labs    12/07/21 0913  CHOL 128  TRIG 83.0  HDL 38.10*  VLDL 16.6  LDLCALC 73     Risk Assessment/Calculations/Metrics:    CHA2DS2-VASc Score = 7   This indicates a 11.2% annual risk of stroke. The patient's score is based upon: CHF History: 1 HTN History: 1 Diabetes History: 0 Stroke History: 2 Vascular Disease History: 1 Age Score: 1 Gender Score: 1             Physical Exam:    VS:  BP 132/80   Pulse 74   Ht '5\' 5"'$  (1.651 m)   Wt 145 lb (65.8 kg)   SpO2 97%   BMI 24.13 kg/m  Wt Readings from Last 3 Encounters:  04/03/22 145 lb (65.8 kg)  12/07/21 143 lb 8 oz (65.1 kg)  08/09/21 138 lb (62.6 kg)    Constitutional:      Appearance: Healthy appearance. Not in distress.  Neck:     Vascular: No JVR. JVD normal.  Pulmonary:     Effort: Pulmonary effort is normal.     Breath sounds: No wheezing. No rales.  Cardiovascular:     Normal rate. Regular rhythm. Normal S1. Normal S2.      Murmurs: There is no murmur.  Edema:    Peripheral edema absent.  Abdominal:     Palpations: Abdomen is soft.  Skin:    General: Skin is warm and dry.  Neurological:     Mental Status: Alert and oriented to person, place and time.     Cranial Nerves: Cranial nerves are intact.        ASSESSMENT & PLAN:   HFmrEF (heart failure with mildly reduced EF) EF has remained 45-50.  She is thought to have a nonischemic cardiomyopathy with low risk Myoview in the past.  Her most recent echocardiogram demonstrated stable EF.  However, her GLS is higher.  She continues to have minimal symptoms.  She is NYHA I-II.  She was unable to tolerate spironolactone.  She is currently having symptoms of hair loss.  There is some concern that this may be coming from metoprolol succinate.  I will try her off the metoprolol to see if her hair loss  improves.  If so, we can try carvedilol or bisoprolol.  If not, I would resume the metoprolol.  I would like to get her on an ACE/ARB.  We discussed the benefits of this.  Of note, she was previously seen by Dr. Rayann Heman in the past.  I will continue to follow her along with Dr. Johney Frame. Decrease metoprolol succinate to 12.5 mg daily x3 days, then stop Start losartan 25 mg daily BMET 2 weeks Follow-up with me in 3 months Depending upon response to stopping Metoprolol >> resume vs start Bisoprolol or Carvedilol  Consider repeat echo in April 2024 If GLS continues to change, consider cardiac MRI  Hair loss Stop beta-blocker for now.  Check TSH.  Essential hypertension Blood pressure is controlled.  She has been on a stable regimen for years.  Continue clonidine 0.1 mg twice daily, HCTZ 25 mg daily.  Start losartan 25 mg daily.  Recent potassium low.  Continue K+ 10 mEq daily.  Repeat BMET in 2 weeks.  Paroxysmal atrial fibrillation (HCC) Maintaining sinus rhythm.  She is tolerating anticoagulation.  Creatinine clearance is 69.  Continue rivaroxaban 20 mg daily.  Recent hemoglobin normal.  Aortic atherosclerosis ([place]) She is not on antiplatelet therapy as she is on Rivaroxaban.  Continue atorvastatin 40 mg daily.           Dispo:  Return in about 3 months (around 07/04/2022) for Routine Follow Up, w/ Richardson Dopp, PA-C.   Medication Adjustments/Labs and Tests Ordered: Current medicines are reviewed at length with the patient today.  Concerns regarding medicines are outlined above.  Tests Ordered: Orders Placed This Encounter  Procedures   Basic metabolic panel   TSH   EKG 12-Lead   Medication Changes: Meds ordered this encounter  Medications   losartan (COZAAR) 25 MG tablet    Sig: Take 1 tablet (25 mg total) by mouth daily.    Dispense:  90 tablet    Refill:  3   metoprolol  succinate (TOPROL XL) 25 MG 24 hr tablet    Sig: Take 1/2 tablet by mouth daily X's 3 days then stop it.     Dispense:  15 tablet    Refill:  0   Signed, Richardson Dopp, PA-C  04/03/2022 10:17 AM    [facility] [address], [place], [place]  [address] Phone: [phone]; Fax: [phone]

## 2022-04-03 ENCOUNTER — Encounter: Payer: Self-pay | Admitting: Physician Assistant

## 2022-04-03 ENCOUNTER — Ambulatory Visit (INDEPENDENT_AMBULATORY_CARE_PROVIDER_SITE_OTHER): Payer: No Typology Code available for payment source | Admitting: Physician Assistant

## 2022-04-03 VITALS — BP 132/80 | HR 74 | Ht 65.0 in | Wt 145.0 lb

## 2022-04-03 DIAGNOSIS — I5020 Unspecified systolic (congestive) heart failure: Secondary | ICD-10-CM

## 2022-04-03 DIAGNOSIS — I700 Atherosclerosis of aorta: Secondary | ICD-10-CM

## 2022-04-03 DIAGNOSIS — I480 Paroxysmal atrial fibrillation: Secondary | ICD-10-CM

## 2022-04-03 DIAGNOSIS — I10 Essential (primary) hypertension: Secondary | ICD-10-CM | POA: Diagnosis not present

## 2022-04-03 DIAGNOSIS — L659 Nonscarring hair loss, unspecified: Secondary | ICD-10-CM | POA: Insufficient documentation

## 2022-04-03 MED ORDER — METOPROLOL SUCCINATE ER 25 MG PO TB24: ORAL_TABLET | ORAL | 0 refills | Status: DC

## 2022-04-03 MED ORDER — LOSARTAN POTASSIUM 25 MG PO TABS: 25.0000 mg | ORAL_TABLET | Freq: Every day | ORAL | 3 refills | Status: DC

## 2022-04-03 NOTE — Assessment & Plan Note (Signed)
Blood pressure is controlled.  She has been on a stable regimen for years.  Continue clonidine 0.1 mg twice daily, HCTZ 25 mg daily.  Start losartan 25 mg daily.  Recent potassium low.  Continue K+ 10 mEq daily.  Repeat BMET in 2 weeks.

## 2022-04-03 NOTE — Patient Instructions (Addendum)
Medication Instructions:  Your physician has recommended you make the following change in your medication:   DECREASE the Metoprolol (Toprol) to 1/2 tablet daily for 3 days then stop  Once you have stopped the Metoprolol, you can start Losartan 25 mg taking 1 daily    *If you need a refill on your cardiac medications before your next appointment, please call your pharmacy*   Lab Work: 2 WEEKS:  BMET & TSH  If you have labs (blood work) drawn today and your tests are completely normal, you will receive your results only by: [place] (if you have MyChart) OR A paper copy in the mail If you have any lab test that is abnormal or we need to change your treatment, we will call you to review the results.   Testing/Procedures: None ordered   Follow-Up: At [facility], you and your health needs are our priority.  As part of our continuing mission to provide you with exceptional heart care, we have created designated Provider Care Teams.  These Care Teams include your primary Cardiologist (physician) and Advanced Practice Providers (APPs -  Physician Assistants and Nurse Practitioners) who all work together to provide you with the care you need, when you need it.  We recommend signing up for the patient portal called "MyChart".  Sign up information is provided on this After Visit Summary.  MyChart is used to connect with patients for Virtual Visits (Telemedicine).  Patients are able to view lab/test results, encounter notes, upcoming appointments, etc.  Non-urgent messages can be sent to your provider as well.   To learn more about what you can do with MyChart, go to [email or web address].    Your next appointment:   3 month(s)  The format for your next appointment:   In Person  Provider:   Thompson Grayer, MD  or Richardson Dopp, PA-C         Other Instructions   Important Information About Sugar

## 2022-04-03 NOTE — Assessment & Plan Note (Signed)
Stop beta-blocker for now.  Check TSH.

## 2022-04-03 NOTE — Assessment & Plan Note (Signed)
She is not on antiplatelet therapy as she is on Rivaroxaban.  Continue atorvastatin 40 mg daily.

## 2022-04-03 NOTE — Assessment & Plan Note (Signed)
Maintaining sinus rhythm.  She is tolerating anticoagulation.  Creatinine clearance is 69.  Continue rivaroxaban 20 mg daily.  Recent hemoglobin normal.

## 2022-04-03 NOTE — Assessment & Plan Note (Addendum)
Alicia Bolton has remained 45-50.  She is thought to have a nonischemic cardiomyopathy with low risk Myoview in the past.  Her most recent echocardiogram demonstrated stable Alicia Bolton.  However, her GLS is higher.  She continues to have minimal symptoms.  She is NYHA I-II.  She was unable to tolerate spironolactone.  She is currently having symptoms of hair loss.  There is some concern that this may be coming from metoprolol succinate.  I will try her off the metoprolol to see if her hair loss improves.  If so, we can try carvedilol or bisoprolol.  If not, I would resume the metoprolol.  I would like to get her on an ACE/ARB.  We discussed the benefits of this.  Of note, she was previously seen by Dr. Rayann Heman in the past.  I will continue to follow her along with Dr. Johney Frame.  Decrease metoprolol succinate to 12.5 mg daily x3 days, then stop  Start losartan 25 mg daily  BMET 2 weeks  Follow-up with me in 3 months  Depending upon response to stopping Metoprolol >> resume vs start Bisoprolol or Carvedilol   Consider repeat echo in April 2024  If GLS continues to change, consider cardiac MRI

## 2022-04-09 ENCOUNTER — Encounter: Payer: Self-pay | Admitting: Internal Medicine

## 2022-04-19 ENCOUNTER — Other Ambulatory Visit: Payer: No Typology Code available for payment source

## 2022-04-23 ENCOUNTER — Other Ambulatory Visit: Payer: Self-pay | Admitting: Physician Assistant

## 2022-04-23 DIAGNOSIS — I480 Paroxysmal atrial fibrillation: Secondary | ICD-10-CM

## 2022-05-31 ENCOUNTER — Encounter: Payer: Self-pay | Admitting: Internal Medicine

## 2022-05-31 ENCOUNTER — Other Ambulatory Visit: Payer: Self-pay | Admitting: Internal Medicine

## 2022-05-31 MED ORDER — LOSARTAN POTASSIUM 25 MG PO TABS: 25.0000 mg | ORAL_TABLET | Freq: Every day | ORAL | 3 refills | Status: DC

## 2022-06-04 ENCOUNTER — Ambulatory Visit (HOSPITAL_COMMUNITY): Payer: Self-pay

## 2022-06-05 ENCOUNTER — Ambulatory Visit (INDEPENDENT_AMBULATORY_CARE_PROVIDER_SITE_OTHER): Payer: No Typology Code available for payment source

## 2022-06-05 ENCOUNTER — Ambulatory Visit (HOSPITAL_COMMUNITY)
Admission: RE | Admit: 2022-06-05 | Discharge: 2022-06-05 | Disposition: A | Discharge status: Home / Self Care | Payer: No Typology Code available for payment source | Source: Ambulatory Visit | Attending: Emergency Medicine | Admitting: Emergency Medicine

## 2022-06-05 ENCOUNTER — Encounter (HOSPITAL_COMMUNITY): Payer: Self-pay

## 2022-06-05 VITALS — BP 125/81 | HR 80 | Temp 98.5°F | Resp 16

## 2022-06-05 DIAGNOSIS — M19032 Primary osteoarthritis, left wrist: Secondary | ICD-10-CM | POA: Diagnosis not present

## 2022-06-05 DIAGNOSIS — M25532 Pain in left wrist: Secondary | ICD-10-CM

## 2022-06-05 DIAGNOSIS — S92515A Nondisplaced fracture of proximal phalanx of left lesser toe(s), initial encounter for closed fracture: Secondary | ICD-10-CM

## 2022-06-05 DIAGNOSIS — S99922A Unspecified injury of left foot, initial encounter: Secondary | ICD-10-CM | POA: Diagnosis not present

## 2022-06-05 NOTE — Discharge Instructions (Addendum)
You can use the wrist brace and shoe for comfort.  Please follow up with the orthopedic specialists if symptoms persist.

## 2022-06-05 NOTE — ED Triage Notes (Signed)
Pt reports jamming left little toe against a sofa 4 days ago. C/O continued pain, swelling, redness in left lateral distal foot. Ambulates with limp. All left toes with CMS intact. Also reports reaching for her car door 3 days ago when she felt a "pop" in left wrist. No c/o pain extending from left ring finger down into left forearm, with concentrated pain in left anterior wrist. LUE CMS intact.

## 2022-06-05 NOTE — ED Provider Notes (Signed)
[facility]    CSN: [id number] Arrival date & time: 06/05/22  9562     History   Chief Complaint Chief Complaint  Patient presents with   Appointment   Foot Injury   Arm Pain    HPI Alicia Bolton is a 66 y.o. female.  Presents with 4-day history of left pinky toe pain.  Reports she hit it on a sofa.  Has had pain, swelling, redness.  Unable to place weight on the toes. Has been using ice and elevating the foot.  Additionally reports left wrist pain for 3 days.  Felt a pop and has experienced pain in the left ring finger into the wrist and forearm.  History of carpal tunnel, cervical stenosis Takes norco for chronic pain  Past Medical History:  Diagnosis Date   Anxiety    takes Ativan daily as needed   Arthritis    Carpal tunnel syndrome 07/17/2013   Chest mass 04/17/2015   Chronic migraine 02/08/2016   DDD (degenerative disc disease), lumbar 04/17/2017   Dilated cardiomyopathy ([place]) 12/09/2019   EF 45-50 by echocardiogram // Myoview 11/2019: EF 46, no ischemia, mild apical thinning artifact; Low Risk // Echo 4/23: EF 45-50, no RWMA, GR 1 DD, GLS -15.2, normal RVSF, normal PASP, RVSP 25.3, mild MR, AV sclerosis without stenosis   DISORDER, TOBACCO USE 06/27/2007   Qualifier: Diagnosis of  By: Jobe Igo MD, David     Elevated liver function tests 10/04/2014   Fibromyalgia    GAD (generalized anxiety disorder) 10/10/2017   History of bronchitis    few yrs ago   History of colon polyps    benign   History of migraine    last one over a month ago   Hyperlipidemia    takes Atorvastatin daily   Hypertension    takes Clonidine and HCTZ daily   Joint pain    Migraines    Muscle spasms of neck 06/06/2016   Neuromuscular disorder ([place])    Neuropathy 04/25/2016   Nocturia    PAF (paroxysmal atrial fibrillation) ([place]) 09/17/2018   Recurrent boils 04/25/2016   Spinal stenosis of cervical region 10/25/2014   Stroke ([place]) 13/0865   Embolic Right ACA,  ILR placed  Dr. 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   Dilated cardiomyopathy ([place]) 12/09/2019   Paroxysmal atrial fibrillation ([place]) 09/17/2018   GAD (generalized anxiety disorder) 10/10/2017   DDD (degenerative disc disease), lumbar 04/17/2017   Muscle spasms of neck 06/06/2016   Neuropathy 04/25/2016   Recurrent boils 04/25/2016   Hyperlipidemia 02/16/2016   Chronic migraine 02/08/2016   Essential hypertension 12/08/2015   Cryptogenic stroke ([place]) 10/02/2015   Chest mass 04/17/2015   Spinal stenosis of cervical region 10/25/2014   Elevated liver function tests 10/04/2014   Polyarthralgia 09/20/2014   Hidradenitis suppurativa 07/21/2014   Tear of medial meniscus of right knee 10/09/2013   Carpal tunnel syndrome 07/17/2013   HEADACHE 06/27/2007    Past Surgical History:  Procedure Laterality Date   ABDOMINAL HYSTERECTOMY  1997   ANTERIOR CERVICAL DECOMP/DISCECTOMY FUSION N/A 10/25/2014   Procedure: CERVICAL THREE-FOUR, CERVICAL FOUR FIVE ANTERIOR CERVICAL DECOMPRESSION/DISCECTOMY FUSION 2 LEVEL/HARDWARE REMOVALCERVICAL FIVE-SEVEN;  Surgeon: Elaina Hoops, MD;  Location: [facility];  Service: Neurosurgery;  Laterality: N/A;   BREAST DUCTAL SYSTEM EXCISION Left 11/14/2016   Procedure: EXCISION DUCTAL SYSTEM LEFT BREAST;  Surgeon: Autumn Messing III, MD;  Location: [facility];  Service: General;  Laterality: Left;   BREAST EXCISIONAL BIOPSY Right    benign   BREAST EXCISIONAL BIOPSY Left    benign   CARPAL TUNNEL RELEASE Left 10/25/2014   Procedure: CARPAL TUNNEL RELEASE;  Surgeon: Elaina Hoops, MD;  Location: [facility];  Service: Neurosurgery;  Laterality: Left;   CARPAL TUNNEL RELEASE Right    CERVICAL FUSION  2007   CESAREAN SECTION  1985   CHEST SURGERY  04/2015    Benign chest mass removed- [place] N/A 10/06/2015   Procedure: Loop Recorder Insertion;  Surgeon: Thompson Grayer, MD;  Location: [facility];  Service: Cardiovascular;  Laterality: N/A;   implantable loop recorder removal  08/10/2019   Medtronic Reveal LINQ removed in office by Dr Rayann Heman   KNEE ARTHROSCOPY Right 1999   KNEE ARTHROSCOPY WITH MEDIAL MENISECTOMY Right 10/09/2013   Procedure: RIGHT KNEE ARTHROSCOPY WITH MEDIAL MENISECTOMY;  Surgeon: Johnny Bridge, MD;  Location: [facility];  Service: Orthopedics;  Laterality: Right;  clean   TEE WITHOUT CARDIOVERSION N/A 10/05/2015   Procedure: TRANSESOPHAGEAL ECHOCARDIOGRAM (TEE);  Surgeon: Thayer Headings, MD;  Location: [facility];  Service: Cardiovascular;  Laterality: N/A;   thumb surgery Right     OB History     Gravida  2   Para  1   Term  1   Preterm      AB  1   Living  1      SAB      IAB  1   Ectopic      Multiple      Live Births               Home Medications    Prior to Admission medications   Medication Sig Start Date End Date Taking? Authorizing Provider  acyclovir (ZOVIRAX) 400 MG tablet Take 1 tablet (400 mg total) by mouth 2 (two) times daily. 11/07/21  Yes Isaac Bliss, Rayford Halsted, MD  atorvastatin (LIPITOR) 40 MG tablet Take 1 tablet (40 mg total) by mouth daily. 11/07/21  Yes Isaac Bliss, Rayford Halsted, MD  cloNIDine (CATAPRES) 0.1 MG tablet TAKE 1 TABLET TWICE A DAY 05/31/22  Yes Isaac Bliss, Rayford Halsted, MD  hydrochlorothiazide (HYDRODIURIL) 25 MG tablet Take 1 tablet (25 mg total) by mouth daily. 11/06/21  Yes Weaver, Scott T, PA-C  losartan (COZAAR) 25 MG tablet Take 1 tablet (25 mg total) by mouth daily. 05/31/22  Yes Freada Bergeron, MD  potassium chloride (KLOR-CON) 10 MEQ tablet Take 1 tablet (10 mEq total) by mouth daily. 01/29/22  Yes Isaac Bliss, Rayford Halsted, MD  XARELTO 20 MG TABS tablet  TAKE 1 TABLET DAILY WITH   SUPPER 04/23/22  Yes Freada Bergeron, MD  Antiseborrheic Products, Misc. (PROMISEB) CREA Apply 1 application topically as needed (for skin breakouts). 06/26/21   [provider]  clotrimazole (LOTRIMIN) 1 % cream Apply 1 application topically 2 (two) times daily. To face 03/30/20   Alycia Rossetti, MD  HYDROcodone-acetaminophen [facility]) 10-325 MG tablet Take 1 tablet by mouth every 8 (eight) hours as needed. Chronic Pain. Dx: G89.4 11/07/21   Isaac Bliss, Rayford Halsted, MD  LORazepam (ATIVAN) 0.5 MG tablet Take 1 tablet (  0.5 mg total) by mouth 2 (two) times daily as needed. for anxiety 09/17/18   Alycia Rossetti, MD  metoprolol succinate (TOPROL XL) 25 MG 24 hr tablet Take 1/2 tablet by mouth daily X's 3 days then stop it. 04/03/22 04/06/22  Richardson Dopp T, PA-C  tiZANidine (ZANAFLEX) 4 MG tablet Take 1 tablet (4 mg total) by mouth every 6 (six) hours as needed for muscle spasms. 07/11/21   Isaac Bliss, Rayford Halsted, MD  triamcinolone cream (KENALOG) 0.1 % Apply 1 application topically 2 (two) times daily. 08/10/20   Alycia Rossetti, MD    Family History Family History  Problem Relation Age of Onset   Hypertension Mother    Diabetes Mother    Cancer Maternal Grandmother        colon   Stroke Maternal Grandmother    Colon polyps Maternal Grandmother    Colon cancer Neg Hx    Pancreatic cancer Neg Hx    Esophageal cancer Neg Hx     Social History Social History   Tobacco Use   Smoking status: Former    Types: Cigarettes    Quit date: 07/07/2014    Years since quitting: 7.9   Smokeless tobacco: Never   Tobacco comments:    may smoke 2 cigarettes per month  Vaping Use   Vaping Use: Never used  Substance Use Topics   Alcohol use: Not Currently   Drug use: No     Allergies   Azithromycin, Floxin [ofloxacin], Other, Penicillin g, Penicillins, and Spironolactone   Review of Systems Review of Systems Per HPI  Physical Exam Triage Vital  Signs ED Triage Vitals  Enc Vitals Group     BP 06/05/22 0830 125/81     Pulse Rate 06/05/22 0830 80     Resp 06/05/22 0830 16     Temp 06/05/22 0830 98.5 F (36.9 C)     Temp Source 06/05/22 0830 Oral     SpO2 06/05/22 0830 96 %     Weight --      Height --      Head Circumference --      Peak Flow --      Pain Score 06/05/22 0833 4     Pain Loc --      Pain Edu? --      Excl. in [place]? --    No data found.  Updated Vital Signs BP 125/81   Pulse 80   Temp 98.5 F (36.9 C) (Oral)   Resp 16   SpO2 96%    Physical Exam Vitals and nursing note reviewed.  Constitutional:      General: She is not in acute distress.    Appearance: Normal appearance.  Cardiovascular:     Rate and Rhythm: Normal rate and regular rhythm.     Pulses: Normal pulses.     Heart sounds: Normal heart sounds.  Pulmonary:     Effort: Pulmonary effort is normal.     Breath sounds: Normal breath sounds.  Musculoskeletal:        General: Normal range of motion.     Comments: Full ROM of left wrist, pulses and sensation intact. Some minor pain reproduced with full extension against resistance. Left pinky toe tender to touch, mild swelling, no nail damage, cap refill < 2 seconds, sensation intact  Skin:    Capillary Refill: Capillary refill takes less than 2 seconds.  Neurological:     Mental Status: She is alert and oriented to person,  place, and time.      UC Treatments / Results  Labs (all labs ordered are listed, but only abnormal results are displayed) Labs Reviewed - No data to display  EKG   Radiology DG Foot Complete Left  Result Date: 06/05/2022 CLINICAL DATA:  66 year old female with history of injury to left fifth toe. EXAM: LEFT FOOT - COMPLETE 3+ VIEW COMPARISON:  None Available. FINDINGS: Mild cortical irregularity about the lateral aspect of the distal fifth proximal phalanx with suggestion of faint linear lucency horizontal cross the distal diaphysis. No evidence of displacement  or angulation. Mild plantar in enthesopathy. Soft tissues are unremarkable. IMPRESSION: Possible acute, nondisplaced fracture of the distal aspect of the fifth proximal phalanx. Electronically Signed   By: Ruthann Cancer M.D.   On: 06/05/2022 09:02   DG Wrist Complete Left  Result Date: 06/05/2022 CLINICAL DATA:  wrist pain EXAM: LEFT WRIST - COMPLETE 3+ VIEW COMPARISON:  None Available. FINDINGS: There is no evidence of fracture or dislocation. Mild degenerative changes and are more prominent at the first carpometacarpal joint with some narrowing of the joint space. Soft tissues are unremarkable. IMPRESSION: Mild degenerative changes and are more prominent at the first carpometacarpal joint. Electronically Signed   By: Frazier Richards M.D.   On: 06/05/2022 09:01    Procedures Procedures   Medications Ordered in UC Medications - No data to display  Initial Impression / Assessment and Plan / UC Course  I have reviewed the triage vital signs and the nursing notes.  Pertinent labs & imaging results that were available during my care of the patient were reviewed by me and considered in my medical decision making (see chart for details).  Left foot x-ray with possible nondisplaced fracture of proximal 5th phalanx, distal aspect. Will provide post op shoe for comfort, recommend avoid tight fitting shoes. Can buddy tape if needed. Left wrist x-ray mild degenerative changes, no acute abnormality. Recommend continue ice, elevate. Provided wrist brace for comfort. Provided contact information for orthopedic specialist.  Recommend follow-up with them if symptoms persist. Patient agrees to plan.  Final Clinical Impressions(s) / UC Diagnoses   Final diagnoses:  Closed nondisplaced fracture of proximal phalanx of lesser toe of left foot, initial encounter  Left wrist pain     Discharge Instructions      You can use the wrist brace and shoe for comfort.  Please follow up with the orthopedic  specialists if symptoms persist.      ED Prescriptions   None    PDMP not reviewed this encounter.   Abbrielle Batts, Wells Guiles, [place] 06/05/22 6295

## 2022-06-15 ENCOUNTER — Telehealth: Payer: Self-pay | Admitting: Internal Medicine

## 2022-06-15 NOTE — Telephone Encounter (Addendum)
Hans with [facility] called to ask if the following medication can be refilled for this Pt:  atorvastatin (LIPITOR) 40 MG tablet  Last OV:  12/07/2021 Rep was informed that MD may want to see Pt before refilling this medication.  Please advise.  [phone]  ext [place], [place] to Registered [place] Sites Phone:  [phone]  Fax:  [phone]

## 2022-06-25 ENCOUNTER — Telehealth: Payer: Self-pay | Admitting: Internal Medicine

## 2022-06-25 NOTE — Telephone Encounter (Signed)
Wants to know if atorvastatin (LIPITOR) 40 MG tablet is still an active prescription and if so requesting a refill for the patient  [facility] [place], [place] to Registered [place] Sites Phone:  [phone]  Fax:  [phone]

## 2022-06-26 MED ORDER — ATORVASTATIN CALCIUM 40 MG PO TABS: 40.0000 mg | ORAL_TABLET | Freq: Every day | ORAL | 1 refills | Status: DC

## 2022-06-26 NOTE — Telephone Encounter (Signed)
Refill sent.

## 2022-07-10 ENCOUNTER — Ambulatory Visit: Payer: No Typology Code available for payment source | Admitting: Physician Assistant

## 2022-07-23 DIAGNOSIS — I480 Paroxysmal atrial fibrillation: Secondary | ICD-10-CM

## 2022-07-24 NOTE — Telephone Encounter (Signed)
Can we see if she qualifies for any assistance? Richardson Dopp, PA-C    07/24/2022 11:19 AM

## 2022-07-25 MED ORDER — RIVAROXABAN 20 MG PO TABS: ORAL_TABLET | ORAL | 1 refills | Status: DC

## 2022-07-30 MED ORDER — RIVAROXABAN 20 MG PO TABS: ORAL_TABLET | ORAL | 1 refills | Status: DC

## 2022-07-30 NOTE — Telephone Encounter (Signed)
Notified pt that we were leaving 2 bottles of Xarelto 20 mg tablets at the front desk at [facility]. I advised the pt that if she has any other problems, questions or concerns, to give our office a call.

## 2022-08-19 NOTE — Progress Notes (Deleted)
Cardiology Office Note:    Date:  08/19/2022   ID:  [name], DOB Jan 13, 1956, MRN [id number]  PCP:  Isaac Bliss, Rayford Halsted, MD  [facility] Providers Cardiologist:  Freada Bergeron, MD Cardiology APP:  Liliane Shi, PA-C { Click to update primary MD,subspecialty MD or APP then REFRESH:1}  *** Referring MD: Isaac Bliss, [place]*   Chief Complaint:  No chief complaint on file. 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 female with the above problem list.  She was last seen in June 2023. I stopped her beta-blocker due to weight loss and started her on Losartan. She missed her f/u BMET due to being exposed to COVID-19. She returns for f/u. ***        Past Medical History:  Diagnosis Date   Anxiety    takes Ativan daily as needed   Arthritis    Carpal tunnel syndrome 07/17/2013   Chest mass 04/17/2015   Chronic migraine 02/08/2016   DDD (degenerative disc disease), lumbar 04/17/2017    Dilated cardiomyopathy ([place]) 12/09/2019   EF 45-50 by echocardiogram // Myoview 11/2019: EF 46, no ischemia, mild apical thinning artifact; Low Risk // Echo 4/23: EF 45-50, no RWMA, GR 1 DD, GLS -15.2, normal RVSF, normal PASP, RVSP 25.3, mild MR, AV sclerosis without stenosis   DISORDER, TOBACCO USE 06/27/2007   Qualifier: Diagnosis of  By: Jobe Igo MD, David     Elevated liver function tests 10/04/2014   Fibromyalgia    GAD (generalized anxiety disorder) 10/10/2017   History of bronchitis    few yrs ago   History of colon polyps    benign   History of migraine    last one over a month ago   Hyperlipidemia    takes Atorvastatin daily   Hypertension    takes Clonidine and HCTZ daily   Joint pain    Migraines    Muscle spasms of neck 06/06/2016   Neuromuscular disorder ([place])    Neuropathy 04/25/2016   Nocturia    PAF (paroxysmal atrial fibrillation) ([place]) 09/17/2018   Recurrent boils 04/25/2016   Spinal stenosis of cervical region 10/25/2014   Stroke ([place]) 16/0109   Embolic Right ACA,  ILR placed Dr. Rayann Heman.States slight weakness in left   Tear of medial meniscus of right knee 10/09/2013   Current Medications: No outpatient medications have been marked as taking for the 08/20/22 encounter (Appointment) with Richardson Dopp T, PA-C.    Allergies:   Azithromycin, Floxin [ofloxacin], Other, Penicillin g, Penicillins, and Spironolactone   Social History   Tobacco  Use   Smoking status: Former    Types: Cigarettes    Quit date: 07/07/2014    Years since quitting: 8.1   Smokeless tobacco: Never   Tobacco comments:    may smoke 2 cigarettes per month  Vaping Use   Vaping Use: Never used  Substance Use Topics   Alcohol use: Not Currently   Drug use: No    Family Hx: The patient's family history includes Cancer in her maternal grandmother; Colon polyps in her maternal grandmother; Diabetes in her mother; Hypertension in her mother; Stroke in her maternal grandmother. There is no  history of Colon cancer, Pancreatic cancer, or Esophageal cancer.  ROS   EKGs/Labs/Other Test Reviewed:    EKG:  EKG is *** ordered today.  The ekg ordered today demonstrates ***  Recent Labs: 12/07/2021: ALT 10; BUN 13; Creatinine, Ser 0.85; Hemoglobin 12.4; Platelets 371.0; Potassium 3.1; Sodium 139   Recent Lipid Panel Recent Labs    12/07/21 0913  CHOL 128  TRIG 83.0  HDL 38.10*  VLDL 16.6  LDLCALC 73     Risk Assessment/Calculations/Metrics:   {Does this patient have ATRIAL FIBRILLATION?:[phone]}     No BP recorded.  {Refresh Note OR Click here to enter BP  :1}***    Physical Exam:    VS:  There were no vitals taken for this visit.    Wt Readings from Last 3 Encounters:  04/03/22 145 lb (65.8 kg)  12/07/21 143 lb 8 oz (65.1 kg)  08/09/21 138 lb (62.6 kg)    Physical Exam ***     ASSESSMENT & PLAN:   No problem-specific Assessment & Plan notes found for this encounter.  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  She is thought to have a nonischemic cardiomyopathy with low risk Myoview in the past.  Her most recent echocardiogram demonstrated stable EF.  However, her GLS is higher.  She continues to have minimal symptoms.  She is NYHA I-II.  She was unable to tolerate spironolactone.  She is currently having symptoms of hair loss.  There is some concern that this may be coming from metoprolol succinate.  I will try her off the metoprolol to see if her hair loss improves.  If so, we can try carvedilol or bisoprolol.  If not, I would resume the metoprolol.  I would like to get her on an ACE/ARB.  We discussed the benefits of this.  Of note, she was previously seen by Dr. Rayann Heman in the past.  I will continue to follow her along with Dr. Johney Frame. Decrease metoprolol succinate to 12.5 mg daily x3 days, then stop Start losartan 25 mg daily BMET 2 weeks Follow-up with me in 3 months Depending upon response to stopping Metoprolol >> resume vs  start Bisoprolol or Carvedilol  Consider repeat echo in April 2024 If GLS continues to change, consider cardiac MRI   Hair loss Stop beta-blocker for now.  Check TSH.   Essential hypertension Blood pressure is controlled.  She has been on a stable regimen for years.  Continue clonidine 0.1 mg twice daily, HCTZ 25 mg daily.  Start losartan 25 mg daily.  Recent potassium low.  Continue K+ 10 mEq daily.  Repeat BMET in 2 weeks.   Paroxysmal atrial fibrillation (HCC) Maintaining sinus rhythm.  She is tolerating anticoagulation.  Creatinine clearance is 69.  Continue rivaroxaban 20 mg daily.  Recent hemoglobin normal.   Aortic atherosclerosis ([place]) She is not on antiplatelet therapy as  she is on Rivaroxaban.  Continue atorvastatin 40 mg daily.***      {Are you ordering a CV Procedure (e.g. stress test, cath, DCCV, TEE, etc)?   Press F2        :[id number]}   Dispo:  No follow-ups on file.   Medication Adjustments/Labs and Tests Ordered: Current medicines are reviewed at length with the patient today.  Concerns regarding medicines are outlined above.  Tests Ordered: No orders of the defined types were placed in this encounter.  Medication Changes: No orders of the defined types were placed in this encounter.  Signed, Richardson Dopp, PA-C  08/19/2022 [address] [place], [place],   [id number] Phone: [phone]; Fax: [phone]

## 2022-08-20 ENCOUNTER — Ambulatory Visit: Payer: No Typology Code available for payment source | Admitting: Physician Assistant

## 2022-08-20 DIAGNOSIS — I480 Paroxysmal atrial fibrillation: Secondary | ICD-10-CM

## 2022-08-20 DIAGNOSIS — I5020 Unspecified systolic (congestive) heart failure: Secondary | ICD-10-CM

## 2022-08-31 ENCOUNTER — Other Ambulatory Visit: Payer: Self-pay | Admitting: Internal Medicine

## 2022-09-12 ENCOUNTER — Ambulatory Visit: Payer: No Typology Code available for payment source | Admitting: Physician Assistant

## 2022-09-21 ENCOUNTER — Other Ambulatory Visit: Payer: Self-pay | Admitting: Physician Assistant

## 2022-10-09 NOTE — Progress Notes (Unsigned)
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  Nuclear stress EF: 46%.  There was no ST segment deviation noted during stress.  The study is normal.  This is a low risk study.  No prior study for comparison  The EF is mildly reduced, and there is mild global hypokinesis. There is no evidence of ischemia. Perfusion is normal, except for very mild apical thinning artifact. Low risk study.   ECHOCARDIOGRAM  ECHOCARDIOGRAM COMPLETE 01/30/2022  Narrative ECHOCARDIOGRAM REPORT    Patient Name:   Alicia Bolton Date of Exam: 01/30/2022 Medical Rec #:  [id number]        Height:       65.0 in Accession #:    [id number]       Weight:       143.5 lb Date of Birth:  October 30, 1955         BSA:          1.718 m Patient Age:    66 years         BP:           102/72 mmHg Patient Gender: F                HR:           67  bpm. Exam Location:  [place]  Procedure: 2D Echo, Cardiac Doppler, Color Doppler, 3D Echo and Strain Analysis  Indications:    I50.31 CHF  History:        Patient has prior history of Echocardiogram examinations, most recent 10/05/2019. CHF, Stroke, Arrythmias:Paroxysmal atrial fibrillation; Risk Factors:Hypertension and Dyslipidemia.  Sonographer:    Coralyn Helling RDCS Referring Phys: [place]   1. Left ventricular ejection fraction, by estimation, is 45 to 50%. Left ventricular ejection fraction by 3D volume is 49 %. The left ventricle has mildly decreased function. The left ventricle has no regional wall motion abnormalities. Left ventricular diastolic parameters are consistent with Grade I diastolic dysfunction (impaired relaxation). The average left ventricular global longitudinal strain is -15.2 %. The global longitudinal strain is abnormal. 2. Right ventricular systolic function is normal. The right ventricular size is normal. There is normal pulmonary artery systolic pressure. The estimated right ventricular systolic pressure is 75.1 mmHg. 3. The mitral valve is grossly normal. Mild mitral valve regurgitation. 4. The aortic valve is tricuspid. There is mild calcification of the aortic valve. There is mild thickening of the aortic valve. Aortic valve regurgitation  is not visualized. Aortic valve sclerosis/calcification is present, without any evidence of aortic stenosis. 5. The inferior vena cava is normal in size with greater than 50% respiratory variability, suggesting right atrial pressure of 3 mmHg.  Comparison(s): Compared to prior TTE in 10/05/2019, there is no significant change. GLS is now lower at 15.2 (previously 20.4%).  FINDINGS Left Ventricle: Left ventricular ejection fraction, by estimation, is 45 to 50%. Left ventricular ejection fraction by 3D volume is 49 %. The left ventricle has mildly decreased function. The left ventricle has no  regional wall motion abnormalities. The average left ventricular global longitudinal strain is -15.2 %. The global longitudinal strain is abnormal. The left ventricular internal cavity size was normal in size. There is no left ventricular hypertrophy. Left ventricular diastolic parameters are consistent with Grade I diastolic dysfunction (impaired relaxation).  Right Ventricle: The right ventricular size is normal. No increase in right ventricular wall thickness. Right ventricular systolic function is normal. There is normal pulmonary artery systolic pressure. The tricuspid regurgitant velocity is 2.36 m/s, and with an assumed right atrial pressure of 3 mmHg, the estimated right ventricular systolic pressure is 97.0 mmHg.  Left Atrium: Left atrial size was normal in size.  Right Atrium: Right atrial size was normal in size.  Pericardium: There is no evidence of pericardial effusion.  Mitral Valve: The mitral valve is grossly normal. There is mild thickening of the mitral valve leaflet(s). There is mild calcification of the mitral valve leaflet(s). Mild mitral annular calcification. Mild mitral valve regurgitation.  Tricuspid Valve: The tricuspid valve is normal in structure. Tricuspid valve regurgitation is mild.  Aortic Valve: The aortic valve is tricuspid. There is mild calcification of the aortic valve. There is mild thickening of the aortic valve. Aortic valve regurgitation is not visualized. Aortic valve sclerosis/calcification is present, without any evidence of aortic stenosis.  Pulmonic Valve: The pulmonic valve was normal in structure. Pulmonic valve regurgitation is trivial.  Aorta: The aortic root and ascending aorta are structurally normal, with no evidence of dilitation.  Venous: The inferior vena cava is normal in size with greater than 50% respiratory variability, suggesting right atrial pressure of 3 mmHg.  IAS/Shunts: The atrial septum is grossly normal.   LEFT  VENTRICLE PLAX 2D LVIDd:         4.30 cm         Diastology LVIDs:         3.20 cm         LV e' medial:    8.59 cm/s LV PW:         1.00 cm         LV E/e' medial:  9.2 LV IVS:        1.00 cm         LV e' lateral:   8.16 cm/s LVOT diam:     2.00 cm         LV E/e' lateral: 9.7 LV SV:         49 LV SV Index:   29              2D LVOT Area:     3.14 cm        Longitudinal Strain 2D Strain GLS  -15.2 % Avg:  3D Volume EF LV 3D EF:    Left ventricul ar ejection fraction by 3D volume is 49 %.  3D Volume EF: 3D EF:        49 % LV EDV:  122 ml LV ESV:       62 ml LV SV:        60 ml  RIGHT VENTRICLE             IVC RV S prime:     16.10 cm/s  IVC diam: 1.00 cm TAPSE (M-mode): 2.1 cm RVSP:           25.3 mmHg  LEFT ATRIUM             Index        RIGHT ATRIUM           Index LA diam:        3.10 cm 1.80 cm/m   RA Pressure: 3.00 mmHg LA Vol (A2C):   44.6 ml 25.96 ml/m  RA Area:     9.48 cm LA Vol (A4C):   29.1 ml 16.94 ml/m  RA Volume:   19.20 ml  11.18 ml/m LA Biplane Vol: 35.9 ml 20.90 ml/m AORTIC VALVE LVOT Vmax:   75.00 cm/s LVOT Vmean:  54.000 cm/s LVOT VTI:    0.156 m  AORTA Ao Root diam: 2.90 cm Ao Asc diam:  3.50 cm  MITRAL VALVE                TRICUSPID VALVE MV Area (PHT): 3.85 cm     TR Peak grad:   22.3 mmHg MV Decel Time: 197 msec     TR Vmax:        236.00 cm/s MV E velocity: 79.10 cm/s   Estimated RAP:  3.00 mmHg MV A velocity: 102.00 cm/s  RVSP:           25.3 mmHg MV E/A ratio:  0.78 SHUNTS Systemic VTI:  0.16 m Systemic Diam: 2.00 cm  Gwyndolyn Kaufman MD Electronically signed by Gwyndolyn Kaufman MD Signature Date/Time: 01/30/2022/10:44:49 AM    Final   TEE  ECHO TEE 10/05/2015  Narrative *[facility]* [address], [place] [address] [phone]  ------------------------------------------------------------------- Transesophageal Echocardiography  Patient:    Alicia Bolton, Alicia Bolton MR #:       [id number] Study Date: 10/05/2015 Gender:     F Age:        62 Height:     167.6 cm Weight:     64.5 kg BSA:        1.74 m^2 Pt. Status: Room:       5M10C  PERFORMING   Mertie Moores, M.D. Huey Bienenstock REFERRING    Brett Canales ADMITTING    Leonie Man, Pramod ATTENDING    Rosalin Hawking SONOGRAPHER  Mikki Santee  cc:  ------------------------------------------------------------------- LV EF: 50% -   55%  ------------------------------------------------------------------- Indications:      CVA 50.  ------------------------------------------------------------------- History:   Risk factors:  Current tobacco use. Hypertension.  ------------------------------------------------------------------- Study Conclusions  - Left ventricle: The cavity size was normal. Wall thickness was normal. Systolic function was normal. The estimated ejection fraction was in the range of 50% to 55%. - Aortic valve: No evidence of vegetation. - Mitral valve: There was mild regurgitation. - Left atrium: No evidence of thrombus in the atrial cavity or appendage. - Atrial septum: No defect or patent foramen ovale was identified. - Tricuspid valve: No evidence of vegetation.  Diagnostic transesophageal echocardiography.  2D and color Doppler. Birthdate:  Patient birthdate: [birth year].  Age:  Patient is 66 yr old.  Sex:  Gender: female.    BMI: 23  kg/m^2.  Blood pressure: 116/90  Patient status:  Outpatient.  Study date:  Study date: 10/05/2015. Study time: 08:56 AM.  Location:  Endoscopy.  -------------------------------------------------------------------  ------------------------------------------------------------------- Left ventricle:  The cavity size was normal. Wall thickness was normal. Systolic function was normal. The estimated ejection fraction was in the range of 50% to 55%.  ------------------------------------------------------------------- Aortic  valve:   Structurally normal valve.   Cusp separation was normal.  No evidence of vegetation.  Doppler:  There was no regurgitation.  ------------------------------------------------------------------- Aorta:  The aorta was mildly calcified.  ------------------------------------------------------------------- Mitral valve:   Doppler:  There was mild regurgitation.  ------------------------------------------------------------------- Left atrium:   No evidence of thrombus in the atrial cavity or appendage.  ------------------------------------------------------------------- Atrial septum:  No ASD or PFO by color doppler or bubble study No defect or patent foramen ovale was identified.  ------------------------------------------------------------------- Tricuspid valve:   Structurally normal valve.   Leaflet separation was normal.  No evidence of vegetation.  Doppler:  There was trivial regurgitation.  ------------------------------------------------------------------- Post procedure conclusions Ascending Aorta:  - The aorta was mildly calcified.  ------------------------------------------------------------------- Prepared and Electronically Authenticated by  Mertie Moores, M.D. 2016-12-07T17:55:33            History of Present Illness:   Alicia Bolton is a 66 y.o. female with the above problem list.  She was last seen 04/03/2022.  She was having issues with hair loss and I stopped her beta-blocker.  I placed her on losartan.  She returns for follow-up. She is here alone. She has not had chest pain. She notes fatigue and dyspnea on exertion with certain activities. This has been chronic since her CVA and thymectomy. She has not had orthopnea, leg edema, syncope.       EKG:  not done    Reviewed and updated this encounter:   Tobacco  Allergies  Meds  Problems  Med Hx  Surg Hx  Fam Hx     Review of Systems  Gastrointestinal:  Negative for hematochezia and melena.   Genitourinary:  Negative for hematuria.    Labs/Other Test Reviewed:   Recent Labs: 12/07/2021: ALT 10; BUN 13; Creatinine, Ser 0.85; Hemoglobin 12.4; Platelets 371.0; Potassium 3.1; Sodium 139  Recent Lipid Panel Recent Labs    12/07/21 0913  CHOL 128  TRIG 83.0  HDL 38.10*  VLDL 16.6  LDLCALC 73    Risk Assessment/Calculations/Metrics:    CHA2DS2-VASc Score = 7   This indicates a 11.2% annual risk of stroke. The patient's score is based upon: CHF History: 1 HTN History: 1 Diabetes History: 0 Stroke History: 2 Vascular Disease History: 1 Age Score: 1 Gender Score: 1            Physical Exam:   VS:  BP 120/76   Pulse 76   Ht _0  (1.676 m)   Wt 146 lb 12.8 oz (66.6 kg)   SpO2 98%   BMI 23.69 kg/m    Wt Readings from Last 3 Encounters:  10/10/22 146 lb 12.8 oz (66.6 kg)  04/03/22 145 lb (65.8 kg)  12/07/21 143 lb 8 oz (65.1 kg)    Constitutional:      Appearance: Healthy appearance. Not in distress.  Neck:     Vascular: JVD normal.  Pulmonary:     Effort: Pulmonary effort is normal.     Breath sounds: No wheezing. No rales.  Cardiovascular:     Normal rate. Regular rhythm. Normal S1. Normal S2.      Murmurs: There  is no murmur.  Edema:    Peripheral edema absent.  Abdominal:     Palpations: There is no hepatomegaly.          ASSESSMENT & PLAN:   HFmrEF (heart failure with mildly reduced EF) EF has remained 45-50.  She is thought to have a nonischemic cardiomyopathy with low risk Myoview in the past.  Her most recent echocardiogram demonstrated stable EF.  However, her GLS is higher.  At last visit, I stopped her Toprol XL due to hair loss. She is tolerating Losartan. She never got her f/u labs and declines getting them today. I explained the importance of this and she assures me she will return in the next week for labs.  Continue losartan 25 mg daily Arrange BMET Follow-up in 6 months with echocardiogram  Hair loss Arrange TSH  Essential  hypertension The patient's blood pressure is controlled on her current regimen.  Continue current therapy with clonidine 0.1 mg twice daily, HCTZ 25 mg daily, losartan 25 mg daily.  Paroxysmal atrial fibrillation (HCC) Maintaining sinus rhythm by exam.  As noted, we need a follow-up BMET and CBC.  She will return next week to have these done.  Continue Xarelto 20 mg daily.           Dispo:  Return in about 6 months (around 04/11/2023) for Routine Follow Up, w/ Richardson Dopp, PA-C.  Medication Adjustments/Labs and Tests Ordered: Current medicines are reviewed at length with the patient today.  Concerns regarding medicines are outlined above.  Tests Ordered: Orders Placed This Encounter  Procedures   Comp Met (CMET)   ECHOCARDIOGRAM COMPLETE   Medication Changes: No orders of the defined types were placed in this encounter.  Signed, Richardson Dopp, PA-C  10/10/2022 8:38 AM    [facility] [address], [place], [place]  [address] Phone: [phone]; Fax: [phone]

## 2022-10-10 ENCOUNTER — Encounter: Payer: Self-pay | Admitting: Physician Assistant

## 2022-10-10 ENCOUNTER — Ambulatory Visit: Payer: No Typology Code available for payment source | Attending: Physician Assistant | Admitting: Physician Assistant

## 2022-10-10 ENCOUNTER — Encounter: Payer: Self-pay | Admitting: Internal Medicine

## 2022-10-10 VITALS — BP 120/76 | HR 76 | Ht 66.0 in | Wt 146.8 lb

## 2022-10-10 DIAGNOSIS — I5020 Unspecified systolic (congestive) heart failure: Secondary | ICD-10-CM

## 2022-10-10 DIAGNOSIS — I10 Essential (primary) hypertension: Secondary | ICD-10-CM

## 2022-10-10 DIAGNOSIS — I420 Dilated cardiomyopathy: Secondary | ICD-10-CM

## 2022-10-10 DIAGNOSIS — L659 Nonscarring hair loss, unspecified: Secondary | ICD-10-CM | POA: Diagnosis not present

## 2022-10-10 DIAGNOSIS — I480 Paroxysmal atrial fibrillation: Secondary | ICD-10-CM

## 2022-10-10 NOTE — Assessment & Plan Note (Signed)
Arrange TSH

## 2022-10-10 NOTE — Assessment & Plan Note (Signed)
Maintaining sinus rhythm by exam.  As noted, we need a follow-up BMET and CBC.  She will return next week to have these done.  Continue Xarelto 20 mg daily.

## 2022-10-10 NOTE — Patient Instructions (Addendum)
Medication Instructions:  Your physician recommends that you continue on your current medications as directed. Please refer to the Current Medication list given to you today.  *If you need a refill on your cardiac medications before your next appointment, please call your pharmacy*   Lab Work: Soon, you need to come in for:  BMET, TSH, & CMET   If you have labs (blood work) drawn today and your tests are completely normal, you will receive your results only by: Alicia Bolton (if you have MyChart) OR A paper copy in the mail If you have any lab test that is abnormal or we need to change your treatment, we will call you to review the results.   Testing/Procedures: Your physician has requested that you have an echocardiogram IN 6 MONTHS 1 WEEK BEFORE YOU RETURN TO SEE SCOTT. Echocardiography is a painless test that uses sound waves to create images of your heart. It provides your doctor with information about the size and shape of your heart and how well your heart's chambers and valves are working. This procedure takes approximately one hour. There are no restrictions for this procedure. Please do NOT wear cologne, perfume, aftershave, or lotions (deodorant is allowed). Please arrive 15 minutes prior to your appointment time.    Follow-Up: At [facility], you and your health needs are our priority.  As part of our continuing mission to provide you with exceptional heart care, we have created designated Provider Care Teams.  These Care Teams include your primary Cardiologist (physician) and Advanced Practice Providers (APPs -  Physician Assistants and Nurse Practitioners) who all work together to provide you with the care you need, when you need it.  We recommend signing up for the patient portal called "MyChart".  Sign up information is provided on this After Visit Summary.  MyChart is used to connect with patients for Virtual Visits (Telemedicine).  Patients are able to view  lab/test results, encounter notes, upcoming appointments, etc.  Non-urgent messages can be sent to your provider as well.   To learn more about what you can do with MyChart, go to [email or web address].    Your next appointment:   6 month(s)  The format for your next appointment:   In Person  Provider:   Richardson Dopp, PA-C         Other Instructions   Important Information About Sugar

## 2022-10-10 NOTE — Assessment & Plan Note (Signed)
The patient's blood pressure is controlled on her current regimen.  Continue current therapy with clonidine 0.1 mg twice daily, HCTZ 25 mg daily, losartan 25 mg daily.

## 2022-10-10 NOTE — Assessment & Plan Note (Signed)
EF has remained 45-50.  She is thought to have a nonischemic cardiomyopathy with low risk Myoview in the past.  Her most recent echocardiogram demonstrated stable EF.  However, her GLS is higher.  At last visit, I stopped her Toprol XL due to hair loss. She is tolerating Losartan. She never got her f/u labs and declines getting them today. I explained the importance of this and she assures me she will return in the next week for labs.  Continue losartan 25 mg daily Arrange BMET Follow-up in 6 months with echocardiogram

## 2022-10-13 DIAGNOSIS — R58 Hemorrhage, not elsewhere classified: Secondary | ICD-10-CM

## 2022-10-16 ENCOUNTER — Ambulatory Visit: Payer: No Typology Code available for payment source | Attending: Physician Assistant

## 2022-10-16 ENCOUNTER — Other Ambulatory Visit: Payer: Self-pay | Admitting: *Deleted

## 2022-10-16 DIAGNOSIS — I5020 Unspecified systolic (congestive) heart failure: Secondary | ICD-10-CM

## 2022-10-16 DIAGNOSIS — Z7901 Long term (current) use of anticoagulants: Secondary | ICD-10-CM

## 2022-10-16 LAB — COMPREHENSIVE METABOLIC PANEL
ALT: 8 IU/L (ref 0–32)
AST: 14 IU/L (ref 0–40)
Albumin/Globulin Ratio: 1.5 (ref 1.2–2.2)
Albumin: 4 g/dL (ref 3.9–4.9)
Alkaline Phosphatase: 107 IU/L (ref 44–121)
BUN/Creatinine Ratio: 11 — ABNORMAL LOW (ref 12–28)
BUN: 10 mg/dL (ref 8–27)
Bilirubin Total: 1 mg/dL (ref 0.0–1.2)
CO2: 25 mmol/L (ref 20–29)
Calcium: 9.2 mg/dL (ref 8.7–10.3)
Chloride: 102 mmol/L (ref 96–106)
Creatinine, Ser: 0.87 mg/dL (ref 0.57–1.00)
Globulin, Total: 2.7 g/dL (ref 1.5–4.5)
Glucose: 99 mg/dL (ref 70–99)
Potassium: 3.9 mmol/L (ref 3.5–5.2)
Sodium: 139 mmol/L (ref 134–144)
Total Protein: 6.7 g/dL (ref 6.0–8.5)
eGFR: 74 mL/min/{1.73_m2} (ref >59)

## 2022-10-16 LAB — CBC
Hematocrit: 36 % (ref 34.0–46.6)
Hemoglobin: 12.6 g/dL (ref 11.1–15.9)
MCH: 32.2 pg (ref 26.6–33.0)
MCHC: 35 g/dL (ref 31.5–35.7)
MCV: 92 fL (ref 79–97)
Platelets: 340 10*3/uL (ref 150–450)
RBC: 3.91 x10E6/uL (ref 3.77–5.28)
RDW: 12.9 % (ref 11.7–15.4)
WBC: 7.3 10*3/uL (ref 3.4–10.8)

## 2022-10-25 NOTE — Telephone Encounter (Signed)
Please change timing of f/u echocardiogram to late March or early April 2024. Richardson Dopp, PA-C    10/25/2022 8:26 AM

## 2022-10-31 ENCOUNTER — Other Ambulatory Visit: Payer: Self-pay | Admitting: Physician Assistant

## 2022-10-31 ENCOUNTER — Other Ambulatory Visit: Payer: Self-pay | Admitting: Internal Medicine

## 2022-10-31 DIAGNOSIS — Z1231 Encounter for screening mammogram for malignant neoplasm of breast: Secondary | ICD-10-CM

## 2022-10-31 MED ORDER — POTASSIUM CHLORIDE CRYS ER 10 MEQ PO TBCR: 10.0000 meq | EXTENDED_RELEASE_TABLET | Freq: Every day | ORAL | 3 refills | Status: DC

## 2022-11-08 ENCOUNTER — Other Ambulatory Visit: Payer: Self-pay | Admitting: Internal Medicine

## 2022-11-08 DIAGNOSIS — M2550 Pain in unspecified joint: Secondary | ICD-10-CM

## 2022-11-08 MED ORDER — HYDROCODONE-ACETAMINOPHEN 10-325 MG PO TABS: 1.0000 | ORAL_TABLET | Freq: Three times a day (TID) | ORAL | 0 refills | Status: DC | PRN

## 2022-11-15 ENCOUNTER — Encounter: Payer: Self-pay | Admitting: Internal Medicine

## 2022-11-15 MED ORDER — POTASSIUM CHLORIDE CRYS ER 10 MEQ PO TBCR: 10.0000 meq | EXTENDED_RELEASE_TABLET | Freq: Every day | ORAL | 3 refills | Status: DC

## 2022-11-15 NOTE — Addendum Note (Signed)
Addended by: Michelle Nasuti on: 11/15/2022 09:02 AM   Modules accepted: Orders

## 2022-12-06 ENCOUNTER — Encounter (HOSPITAL_COMMUNITY): Payer: Self-pay | Admitting: *Deleted

## 2022-12-19 ENCOUNTER — Ambulatory Visit: Payer: No Typology Code available for payment source | Admitting: Internal Medicine

## 2022-12-19 DIAGNOSIS — E782 Mixed hyperlipidemia: Secondary | ICD-10-CM

## 2022-12-19 DIAGNOSIS — I10 Essential (primary) hypertension: Secondary | ICD-10-CM

## 2022-12-24 ENCOUNTER — Ambulatory Visit: Payer: No Typology Code available for payment source

## 2023-01-11 ENCOUNTER — Other Ambulatory Visit: Payer: Self-pay | Admitting: *Deleted

## 2023-01-11 DIAGNOSIS — I480 Paroxysmal atrial fibrillation: Secondary | ICD-10-CM

## 2023-01-11 MED ORDER — RIVAROXABAN 20 MG PO TABS: ORAL_TABLET | ORAL | 1 refills | Status: DC

## 2023-01-11 NOTE — Telephone Encounter (Signed)
Prescription refill request for Xarelto received.  Indication: afib  Last office visit: Kathlen Mody 10/10/2022 Weight: 66.6 kg  Age: 67 yo  Scr: 0.87, 10/26/2022 CrCl: 68 ml/min   Refill sent.

## 2023-01-16 ENCOUNTER — Other Ambulatory Visit: Payer: Self-pay | Admitting: Physician Assistant

## 2023-01-16 ENCOUNTER — Other Ambulatory Visit: Payer: Self-pay | Admitting: Internal Medicine

## 2023-01-17 NOTE — Telephone Encounter (Signed)
Farris Has, CMA, on last Rx order for Hydrochlorothiazide, it says for pt to stop spironolactone and losartan. Losartan is still on pt's medication list. Does pt still supposed to be taking losartan, before I send in Hydrochlorothiazide refill? Please address

## 2023-02-05 ENCOUNTER — Ambulatory Visit (HOSPITAL_COMMUNITY)
Admission: EM | Admit: 2023-02-05 | Discharge: 2023-02-05 | Disposition: A | Discharge status: Home / Self Care | Payer: No Typology Code available for payment source | Attending: Emergency Medicine | Admitting: Emergency Medicine

## 2023-02-05 ENCOUNTER — Encounter (HOSPITAL_COMMUNITY): Payer: Self-pay

## 2023-02-05 DIAGNOSIS — M25511 Pain in right shoulder: Secondary | ICD-10-CM

## 2023-02-05 MED ORDER — BACLOFEN 5 MG PO TABS: 10.0000 mg | ORAL_TABLET | Freq: Two times a day (BID) | ORAL | 0 refills | Status: AC | PRN

## 2023-02-05 NOTE — Discharge Instructions (Addendum)
Please use tylenol every 4-6 hours for pain control You can take the muscle relaxer twice daily. If the medication makes you drowsy, take only at bed time. Continue ice or heat several times daily.   Please follow up with the orthopedic specialists. They have walk-in hours or you can call to make an appointment.

## 2023-02-05 NOTE — ED Provider Notes (Signed)
[facility]    CSN: [id number] Arrival date & time: 02/05/23  0800      History   Chief Complaint Chief Complaint  Patient presents with   Shoulder Pain    HPI Alicia Bolton is a 67 y.o. female.  Her with 1 day history of right shoulder pain Woke up with the pain. Denies any injury, trauma, falls. Reports rotator cuff issue with this shoulder in the past She has not taken any medications for pain No weakness, numbness or tingling of the extremity  Hx neuropathy, polyarthralgia, cervical stenosis, fibromyalgia   Past Medical History:  Diagnosis Date   Anxiety    takes Ativan daily as needed   Arthritis    Carpal tunnel syndrome 07/17/2013   Chest mass 04/17/2015   Chronic migraine 02/08/2016   DDD (degenerative disc disease), lumbar 04/17/2017   Dilated cardiomyopathy 12/09/2019   EF 45-50 by echocardiogram // Myoview 11/2019: EF 46, no ischemia, mild apical thinning artifact; Low Risk // Echo 4/23: EF 45-50, no RWMA, GR 1 DD, GLS -15.2, normal RVSF, normal PASP, RVSP 25.3, mild MR, AV sclerosis without stenosis   DISORDER, TOBACCO USE 06/27/2007   Qualifier: Diagnosis of  By: Reche Dixon MD, David     Elevated liver function tests 10/04/2014   Fibromyalgia    GAD (generalized anxiety disorder) 10/10/2017   History of bronchitis    few yrs ago   History of colon polyps    benign   History of migraine    last one over a month ago   Hyperlipidemia    takes Atorvastatin daily   Hypertension    takes Clonidine and HCTZ daily   Joint pain    Migraines    Muscle spasms of neck 06/06/2016   Neuromuscular disorder    Neuropathy 04/25/2016   Nocturia    PAF (paroxysmal atrial fibrillation) 09/17/2018   Recurrent boils 04/25/2016   Spinal stenosis of cervical region 10/25/2014   Stroke 09/2015   Embolic Right ACA,  ILR placed Dr. 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   Dilated cardiomyopathy 12/09/2019   Paroxysmal atrial fibrillation 09/17/2018   GAD (generalized anxiety disorder) 10/10/2017   DDD (degenerative disc disease), lumbar 04/17/2017   Muscle spasms of neck 06/06/2016   Neuropathy 04/25/2016   Recurrent boils 04/25/2016   Hyperlipidemia 02/16/2016   Chronic migraine 02/08/2016   Essential hypertension 12/08/2015   Cryptogenic stroke 10/02/2015   Chest mass 04/17/2015   Spinal stenosis of cervical region 10/25/2014   Elevated liver function tests 10/04/2014   Polyarthralgia 09/20/2014   Hidradenitis suppurativa 07/21/2014   Tear of medial meniscus of right knee 10/09/2013   Carpal tunnel syndrome 07/17/2013   HEADACHE 06/27/2007    Past Surgical History:  Procedure Laterality Date   ABDOMINAL HYSTERECTOMY  1997   ANTERIOR CERVICAL DECOMP/DISCECTOMY FUSION N/A 10/25/2014   Procedure: CERVICAL THREE-FOUR, CERVICAL FOUR FIVE ANTERIOR CERVICAL DECOMPRESSION/DISCECTOMY FUSION 2 LEVEL/HARDWARE REMOVALCERVICAL FIVE-SEVEN;  Surgeon: Mariam Dollar, MD;  Location: [facility];  Service: Neurosurgery;  Laterality: N/A;   BREAST DUCTAL SYSTEM EXCISION Left 11/14/2016   Procedure: EXCISION DUCTAL SYSTEM LEFT BREAST;  Surgeon: Chevis Pretty III, MD;  Location: [facility];  Service: General;  Laterality: Left;   BREAST EXCISIONAL BIOPSY  Right    benign   BREAST EXCISIONAL BIOPSY Left    benign   CARPAL TUNNEL RELEASE Left 10/25/2014   Procedure: CARPAL TUNNEL RELEASE;  Surgeon: Mariam Dollar, MD;  Location: [facility];  Service: Neurosurgery;  Laterality: Left;   CARPAL TUNNEL RELEASE Right    CERVICAL FUSION  2007   CESAREAN SECTION  1985   CHEST SURGERY  04/2015   Benign chest mass removed- [facility]    CHOLECYSTECTOMY  1998   COLONOSCOPY     EP IMPLANTABLE DEVICE N/A 10/06/2015   Procedure: Loop Recorder  Insertion;  Surgeon: Hillis Range, MD;  Location: [facility];  Service: Cardiovascular;  Laterality: N/A;   implantable loop recorder removal  08/10/2019   [employer] Reveal LINQ removed in office by Dr Johney Frame   KNEE ARTHROSCOPY Right 1999   KNEE ARTHROSCOPY WITH MEDIAL MENISECTOMY Right 10/09/2013   Procedure: RIGHT KNEE ARTHROSCOPY WITH MEDIAL MENISECTOMY;  Surgeon: Eulas Post, MD;  Location: [facility];  Service: Orthopedics;  Laterality: Right;  clean   TEE WITHOUT CARDIOVERSION N/A 10/05/2015   Procedure: TRANSESOPHAGEAL ECHOCARDIOGRAM (TEE);  Surgeon: Vesta Mixer, MD;  Location: [facility];  Service: Cardiovascular;  Laterality: N/A;   thumb surgery Right     OB History     Gravida  2   Para  1   Term  1   Preterm      AB  1   Living  1      SAB      IAB  1   Ectopic      Multiple      Live Births               Home Medications    Prior to Admission medications   Medication Sig Start Date End Date Taking? Authorizing Provider  baclofen 5 MG TABS Take 2 tablets (10 mg total) by mouth 2 (two) times daily as needed for muscle spasms. 02/05/23  Yes Carlen Rebuck, Lurena Joiner, PA-C  acyclovir (ZOVIRAX) 400 MG tablet TAKE 1 TABLET TWICE A DAY 01/16/23   [name], [name], MD  Antiseborrheic Products, Misc. (PROMISEB) CREA Apply 1 application topically as needed (for skin breakouts). 06/26/21   [provider]  atorvastatin (LIPITOR) 40 MG tablet TAKE 1 TABLET DAILY 10/31/22   [name], [name], MD  cloNIDine (CATAPRES) 0.1 MG tablet TAKE 1 TABLET TWICE A DAY 10/31/22   [name], [name], MD  clotrimazole (LOTRIMIN) 1 % cream Apply 1 application topically 2 (two) times daily. To face 03/30/20   Salley Scarlet, MD  hydrochlorothiazide (HYDRODIURIL) 25 MG tablet Take 1 tablet (25 mg total) by mouth daily. 01/17/23   [name] T, PA-C  HYDROcodone-acetaminophen (NORCO) 10-325 MG tablet Take 1 tablet by mouth every  8 (eight) hours as needed. Chronic Pain. Dx: G89.4 11/08/22   [name], [name], MD  LORazepam (ATIVAN) 0.5 MG tablet Take 1 tablet (0.5 mg total) by mouth 2 (two) times daily as needed. for anxiety 09/17/18   Salley Scarlet, MD  losartan (COZAAR) 25 MG tablet Take 1 tablet (25 mg total) by mouth daily. 05/31/22   Meriam Sprague, MD  potassium chloride (KLOR-CON M10) 10 MEQ tablet Take 1 tablet (10 mEq total) by mouth daily. 11/15/22   [name] T, PA-C  rivaroxaban (XARELTO) 20 MG TABS tablet TAKE 1 TABLET DAILY WITH   SUPPER 01/11/23   Meriam Sprague, MD  triamcinolone cream (KENALOG) 0.1 % Apply 1 application topically 2 (two) times daily. 08/10/20   Salley Scarleturham, Kawanta F, MD    Family History Family History  Problem Relation Age of Onset   Hypertension Mother    Diabetes Mother    Cancer Maternal Grandmother        colon   Stroke Maternal Grandmother    Colon polyps Maternal Grandmother    Colon cancer Neg Hx    Pancreatic cancer Neg Hx    Esophageal cancer Neg Hx     Social History Social History   Tobacco Use   Smoking status: Former    Types: Cigarettes    Quit date: 07/07/2014    Years since quitting: 8.5   Smokeless tobacco: Never   Tobacco comments:    may smoke 2 cigarettes per month  Vaping Use   Vaping Use: Never used  Substance Use Topics   Alcohol use: Not Currently   Drug use: No     Allergies   Azithromycin, Floxin [ofloxacin], Other, Penicillin g, Penicillins, and Spironolactone   Review of Systems Review of Systems As per HPI  Physical Exam Triage Vital Signs ED Triage Vitals  Enc Vitals Group     BP 02/05/23 0810 126/86     Pulse Rate 02/05/23 0810 81     Resp 02/05/23 0810 14     Temp 02/05/23 0810 98.1 F (36.7 C)     Temp Source 02/05/23 0810 Oral     SpO2 02/05/23 0810 96 %     Weight --      Height --      Head Circumference --      Peak Flow --      Pain Score 02/05/23 0808 8     Pain Loc --      Pain Edu? --       Excl. in GC? --    No data found.  Updated Vital Signs BP 126/86 (BP Location: Left Arm)   Pulse 81   Temp 98.1 F (36.7 C) (Oral)   Resp 14   SpO2 96%   Physical Exam Vitals and nursing note reviewed.  Constitutional:      General: She is not in acute distress.    Appearance: Normal appearance.  HENT:     Mouth/Throat:     Pharynx: Oropharynx is clear.  Cardiovascular:     Rate and Rhythm: Normal rate and regular rhythm.     Heart sounds: Normal heart sounds.  Pulmonary:     Effort: Pulmonary effort is normal.     Breath sounds: Normal breath sounds.  Musculoskeletal:        General: No swelling, tenderness, deformity or signs of injury. Normal range of motion.     Right shoulder: Normal. No deformity or bony tenderness. Normal range of motion.     Left shoulder: Normal.     Cervical back: Normal range of motion. No rigidity or tenderness.  Skin:    General: Skin is warm and dry.     Capillary Refill: Capillary refill takes less than 2 seconds.     Findings: No bruising or rash.  Neurological:     Mental Status: She is alert and oriented to person, place, and time.     Comments: Strength 5/5 bilaterally. Strong radial pulses. Sensation intact      UC Treatments / Results  Labs (all labs ordered are listed, but only abnormal results are displayed) Labs Reviewed - No data to display  EKG  Radiology No results found.  Procedures Procedures (including critical care time)  Medications Ordered in UC Medications - No data to display  Initial Impression / Assessment and Plan / UC Course  I have reviewed the triage vital signs and the nursing notes.  Pertinent labs & imaging results that were available during my care of the patient were reviewed by me and considered in my medical decision making (see chart for details).  No indication for xray imaging today. Patient requesting narcotic medication for pain control. Discussed use of tylenol and muscle  relaxer for a few days, in combination with ice to the area. Recommend she follow with orthopedic specialist for further evaluation if symptoms are persisting. There is no indication for narcotics at this time.  Final Clinical Impressions(s) / UC Diagnoses   Final diagnoses:  Acute pain of right shoulder     Discharge Instructions      Please use tylenol every 4-6 hours for pain control You can take the muscle relaxer twice daily. If the medication makes you drowsy, take only at bed time. Continue ice or heat several times daily.   Please follow up with the orthopedic specialists. They have walk-in hours or you can call to make an appointment.      ED Prescriptions     Medication Sig Dispense Auth. Provider   baclofen 5 MG TABS Take 2 tablets (10 mg total) by mouth 2 (two) times daily as needed for muscle spasms. 15 tablet Deola Rewis, Lurena Joiner, PA-C      I have reviewed the PDMP during this encounter.   Marlow Baars, [place] 02/05/23 1859

## 2023-02-05 NOTE — ED Triage Notes (Signed)
Pt presents with c/o rt shoulder pain that began yesterday. NKI

## 2023-02-13 DIAGNOSIS — M7541 Impingement syndrome of right shoulder: Secondary | ICD-10-CM | POA: Diagnosis not present

## 2023-02-13 DIAGNOSIS — M7551 Bursitis of right shoulder: Secondary | ICD-10-CM | POA: Diagnosis not present

## 2023-02-13 DIAGNOSIS — M25511 Pain in right shoulder: Secondary | ICD-10-CM | POA: Diagnosis not present

## 2023-02-13 DIAGNOSIS — M19011 Primary osteoarthritis, right shoulder: Secondary | ICD-10-CM | POA: Diagnosis not present

## 2023-02-18 ENCOUNTER — Ambulatory Visit
Admission: RE | Admit: 2023-02-18 | Discharge: 2023-02-18 | Disposition: A | Discharge status: Home / Self Care | Payer: No Typology Code available for payment source | Source: Ambulatory Visit | Attending: Internal Medicine | Admitting: Internal Medicine

## 2023-02-18 DIAGNOSIS — Z1231 Encounter for screening mammogram for malignant neoplasm of breast: Secondary | ICD-10-CM

## 2023-03-08 ENCOUNTER — Telehealth: Payer: Self-pay | Admitting: Internal Medicine

## 2023-03-08 NOTE — Telephone Encounter (Signed)
Alicia Bolton with [facility]  [phone] Ext [phone]  *Cannot seem to reach Pt regarding  Medication Non-Adherence for: Atorvastatin 40 mg  Pt's phone number confirmed  Asking if we could let Pt know they are trying to reach her?

## 2023-03-10 ENCOUNTER — Encounter: Payer: Self-pay | Admitting: Internal Medicine

## 2023-03-11 ENCOUNTER — Other Ambulatory Visit: Payer: Self-pay | Admitting: Internal Medicine

## 2023-03-11 NOTE — Telephone Encounter (Signed)
Spoke with patient and an appointment was scheduled. 

## 2023-03-12 ENCOUNTER — Encounter: Payer: Self-pay | Admitting: Internal Medicine

## 2023-03-12 ENCOUNTER — Ambulatory Visit: Payer: No Typology Code available for payment source | Admitting: Internal Medicine

## 2023-03-12 VITALS — BP 110/80 | HR 70 | Temp 98.1°F | Ht 65.0 in | Wt 143.4 lb

## 2023-03-12 DIAGNOSIS — L03011 Cellulitis of right finger: Secondary | ICD-10-CM | POA: Diagnosis not present

## 2023-03-12 DIAGNOSIS — E782 Mixed hyperlipidemia: Secondary | ICD-10-CM

## 2023-03-12 DIAGNOSIS — R1319 Other dysphagia: Secondary | ICD-10-CM

## 2023-03-12 DIAGNOSIS — Z0000 Encounter for general adult medical examination without abnormal findings: Secondary | ICD-10-CM | POA: Diagnosis not present

## 2023-03-12 DIAGNOSIS — Z1211 Encounter for screening for malignant neoplasm of colon: Secondary | ICD-10-CM

## 2023-03-12 DIAGNOSIS — R7989 Other specified abnormal findings of blood chemistry: Secondary | ICD-10-CM | POA: Diagnosis not present

## 2023-03-12 DIAGNOSIS — I10 Essential (primary) hypertension: Secondary | ICD-10-CM | POA: Diagnosis not present

## 2023-03-12 DIAGNOSIS — Z13820 Encounter for screening for osteoporosis: Secondary | ICD-10-CM

## 2023-03-12 DIAGNOSIS — I480 Paroxysmal atrial fibrillation: Secondary | ICD-10-CM | POA: Diagnosis not present

## 2023-03-12 LAB — CBC WITH DIFFERENTIAL/PLATELET
Basophils Absolute: 0 10*3/uL (ref 0.0–0.1)
Basophils Relative: 0.3 % (ref 0.0–3.0)
Eosinophils Absolute: 0.1 10*3/uL (ref 0.0–0.7)
Eosinophils Relative: 1.5 % (ref 0.0–5.0)
HCT: 39 % (ref 36.0–46.0)
Hemoglobin: 13.4 g/dL (ref 12.0–15.0)
Lymphocytes Relative: 32.8 % (ref 12.0–46.0)
Lymphs Abs: 2.5 10*3/uL (ref 0.7–4.0)
MCHC: 34.5 g/dL (ref 30.0–36.0)
MCV: 95.8 fl (ref 78.0–100.0)
Monocytes Absolute: 0.5 10*3/uL (ref 0.1–1.0)
Monocytes Relative: 6.1 % (ref 3.0–12.0)
Neutro Abs: 4.5 10*3/uL (ref 1.4–7.7)
Neutrophils Relative %: 59.3 % (ref 43.0–77.0)
Platelets: 364 10*3/uL (ref 150.0–400.0)
RBC: 4.07 Mil/uL (ref 3.87–5.11)
RDW: 14 % (ref 11.5–15.5)
WBC: 7.6 10*3/uL (ref 4.0–10.5)

## 2023-03-12 LAB — COMPREHENSIVE METABOLIC PANEL
ALT: 13 U/L (ref 0–35)
AST: 15 U/L (ref 0–37)
Albumin: 4 g/dL (ref 3.5–5.2)
Alkaline Phosphatase: 112 U/L (ref 39–117)
BUN: 15 mg/dL (ref 6–23)
CO2: 27 mEq/L (ref 19–32)
Calcium: 9.4 mg/dL (ref 8.4–10.5)
Chloride: 102 mEq/L (ref 96–112)
Creatinine, Ser: 0.94 mg/dL (ref 0.40–1.20)
GFR: 63.45 mL/min (ref >60.00)
Glucose, Bld: 100 mg/dL — ABNORMAL HIGH (ref 70–99)
Potassium: 3.6 mEq/L (ref 3.5–5.1)
Sodium: 137 mEq/L (ref 135–145)
Total Bilirubin: 1.1 mg/dL (ref 0.2–1.2)
Total Protein: 7.5 g/dL (ref 6.0–8.3)

## 2023-03-12 LAB — LIPID PANEL
Cholesterol: 161 mg/dL (ref 0–200)
HDL: 50.3 mg/dL (ref >39.00)
LDL Cholesterol: 92 mg/dL (ref 0–99)
NonHDL: 110.97
Total CHOL/HDL Ratio: 3
Triglycerides: 93 mg/dL (ref 0.0–149.0)
VLDL: 18.6 mg/dL (ref 0.0–40.0)

## 2023-03-12 LAB — TSH: TSH: 0.81 u[IU]/mL (ref 0.35–5.50)

## 2023-03-12 MED ORDER — DOXYCYCLINE HYCLATE 100 MG PO TABS
100.0000 mg | ORAL_TABLET | Freq: Two times a day (BID) | ORAL | 0 refills | Status: AC
Start: 2023-03-12 — End: 2023-03-19

## 2023-03-12 NOTE — Progress Notes (Signed)
Established Patient Office Visit     CC/Reason for Visit: Annual preventive exam, subsequent Medicare wellness visit, discuss acute concern  HPI: Alicia Bolton is a 67 y.o. female who is coming in today for the above mentioned reasons. Past Medical History is significant for:   hypertension, hyperlipidemia, aortic atherosclerosis, she had a stroke in December 2016 with no large residual deficits.  She also has a history of paroxysmal atrial fibrillation anticoagulated on Xarelto, she also has heart failure with reduced ejection fraction with the most recent EF of around 45% and a dilated cardiomyopathy.  She follows with cardiology routinely.  She also had some kind of benign mass resected from her chest at [facility] about 5 years ago.  Since I last saw her she had 2 cortisone injections to her right shoulder for bursitis.  She has had a total abdominal hysterectomy so no longer requires Pap smears, she had her mammogram in April.  She is overdue for screening colonoscopy.  She has also been experiencing some dysphagia with sensation of food getting stuck, since she will be seeing GI for colonoscopy may also consider EGD with dilatation but will of course defer to GI.  She follows routinely with cardiology.  She has a right thumb paronychia.  It is already draining spontaneously, purulent material.  There is some erythema surrounding the tip of her thumb.  She has routine eye and dental care, no perceived hearing difficulties.   Past Medical/Surgical History: Past Medical History:  Diagnosis Date   Anxiety    takes Ativan daily as needed   Arthritis    Carpal tunnel syndrome 07/17/2013   Chest mass 04/17/2015   Chronic migraine 02/08/2016   DDD (degenerative disc disease), lumbar 04/17/2017   Dilated cardiomyopathy (HCC) 12/09/2019   EF 45-50 by echocardiogram // Myoview 11/2019: EF 46, no ischemia, mild apical thinning artifact; Low Risk // Echo 4/23: EF 45-50, no RWMA, GR 1 DD, GLS  -15.2, normal RVSF, normal PASP, RVSP 25.3, mild MR, AV sclerosis without stenosis   DISORDER, TOBACCO USE 06/27/2007   Qualifier: Diagnosis of  By: Reche Dixon MD, David     Elevated liver function tests 10/04/2014   Fibromyalgia    GAD (generalized anxiety disorder) 10/10/2017   History of bronchitis    few yrs ago   History of colon polyps    benign   History of migraine    last one over a month ago   Hyperlipidemia    takes Atorvastatin daily   Hypertension    takes Clonidine and HCTZ daily   Joint pain    Migraines    Muscle spasms of neck 06/06/2016   Neuromuscular disorder (HCC)    Neuropathy 04/25/2016   Nocturia    PAF (paroxysmal atrial fibrillation) (HCC) 09/17/2018   Recurrent boils 04/25/2016   Spinal stenosis of cervical region 10/25/2014   Stroke (HCC) 09/2015   Embolic Right ACA,  ILR placed Dr. Johney Frame.States slight weakness in left   Tear of medial meniscus of right knee 10/09/2013    Past Surgical History:  Procedure Laterality Date   ABDOMINAL HYSTERECTOMY  1997   ANTERIOR CERVICAL DECOMP/DISCECTOMY FUSION N/A 10/25/2014   Procedure: CERVICAL THREE-FOUR, CERVICAL FOUR FIVE ANTERIOR CERVICAL DECOMPRESSION/DISCECTOMY FUSION 2 LEVEL/HARDWARE REMOVALCERVICAL FIVE-SEVEN;  Surgeon: Mariam Dollar, MD;  Location: [facility]UR[facility]S;  Service: Neurosurgery;  Laterality: N/A;   BREAST DUCTAL SYSTEM EXCISION Left 11/14/2016   Procedure: EXCISION DUCTAL SYSTEM LEFT BREAST;  Surgeon: Chevis Pretty III, MD;  Location: [facility] OR;  Service: General;  Laterality: Left;   BREAST EXCISIONAL BIOPSY Right    benign   BREAST EXCISIONAL BIOPSY Left    benign   CARPAL TUNNEL RELEASE Left 10/25/2014   Procedure: CARPAL TUNNEL RELEASE;  Surgeon: Mariam Dollar, MD;  Location: [facility];  Service: Neurosurgery;  Laterality: Left;   CARPAL TUNNEL RELEASE Right    CERVICAL FUSION  2007   CESAREAN SECTION  1985   CHEST SURGERY  04/2015   Benign chest mass removed- [facility]    CHOLECYSTECTOMY  1998    COLONOSCOPY     EP IMPLANTABLE DEVICE N/A 10/06/2015   Procedure: Loop Recorder Insertion;  Surgeon: Hillis Range, MD;  Location: [facility] INVASIVE CV LAB;  Service: Cardiovascular;  Laterality: N/A;   implantable loop recorder removal  08/10/2019   [employer]tr[employer] Reveal LINQ removed in office by Dr Johney Frame   KNEE ARTHROSCOPY Right 1999   KNEE ARTHROSCOPY WITH MEDIAL MENISECTOMY Right 10/09/2013   Procedure: RIGHT KNEE ARTHROSCOPY WITH MEDIAL MENISECTOMY;  Surgeon: Eulas Post, MD;  Location: [facility];  Service: Orthopedics;  Laterality: Right;  clean   TEE WITHOUT CARDIOVERSION N/A 10/05/2015   Procedure: TRANSESOPHAGEAL ECHOCARDIOGRAM (TEE);  Surgeon: Vesta Mixer, MD;  Location: [facility] ENDOSCOPY;  Service: Cardiovascular;  Laterality: N/A;   thumb surgery Right     Social History:  reports that she quit smoking about 8 years ago. Her smoking use included cigarettes. She has never used smokeless tobacco. She reports that she does not currently use alcohol. She reports that she does not use drugs.  Allergies: Allergies  Allergen Reactions   Azithromycin Nausea And Vomiting   Floxin [Ofloxacin] Nausea And Vomiting    Makes sick to stomach and vomit   Other     "Not supposed to take Tylenol or ASA"   Penicillin G     Other reaction(s): Unknown   Penicillins Rash    Has patient had a PCN reaction causing immediate rash, facial/tongue/throat swelling, SOB or lightheadedness with hypotension: No Has patient had a PCN reaction causing severe rash involving mucus membranes or skin necrosis: No Has patient had a PCN reaction that required hospitalization No Has patient had a PCN reaction occurring within the last 10 years: No If all of the above answers are "NO", then may proceed with Cephalosporin use.    Spironolactone     BP increased; Headaches    Family History:  Family History  Problem Relation Age of Onset   Hypertension Mother    Diabetes Mother    Cancer Maternal  Grandmother        colon   Stroke Maternal Grandmother    Colon polyps Maternal Grandmother    Colon cancer Neg Hx    Pancreatic cancer Neg Hx    Esophageal cancer Neg Hx      Current Outpatient Medications:    acyclovir (ZOVIRAX) 400 MG tablet, TAKE 1 TABLET TWICE A DAY, Disp: 180 tablet, Rfl: 0   Antiseborrheic Products, Misc. (PROMISEB) CREA, Apply 1 application topically as needed (for skin breakouts)., Disp: , Rfl:    atorvastatin (LIPITOR) 40 MG tablet, TAKE 1 TABLET DAILY, Disp: 90 tablet, Rfl: 0   baclofen 5 MG TABS, Take 2 tablets (10 mg total) by mouth 2 (two) times daily as needed for muscle spasms., Disp: 15 tablet, Rfl: 0   cloNIDine (CATAPRES) 0.1 MG tablet, TAKE 1 TABLET TWICE A DAY, Disp: 180 tablet, Rfl: 0   clotrimazole (LOTRIMIN)  1 % cream, Apply 1 application topically 2 (two) times daily. To face, Disp: 30 g, Rfl: 1   doxycycline (VIBRA-TABS) 100 MG tablet, Take 1 tablet (100 mg total) by mouth 2 (two) times daily for 7 days., Disp: 14 tablet, Rfl: 0   hydrochlorothiazide (HYDRODIURIL) 25 MG tablet, Take 1 tablet (25 mg total) by mouth daily., Disp: 90 tablet, Rfl: 2   HYDROcodone-acetaminophen (NORCO) 10-325 MG tablet, Take 1 tablet by mouth every 8 (eight) hours as needed. Chronic Pain. Dx: G89.4, Disp: 30 tablet, Rfl: 0   LORazepam (ATIVAN) 0.5 MG tablet, Take 1 tablet (0.5 mg total) by mouth 2 (two) times daily as needed. for anxiety, Disp: 30 tablet, Rfl: 1   losartan (COZAAR) 25 MG tablet, Take 1 tablet (25 mg total) by mouth daily., Disp: 90 tablet, Rfl: 3   potassium chloride (KLOR-CON M10) 10 MEQ tablet, Take 1 tablet (10 mEq total) by mouth daily., Disp: 90 tablet, Rfl: 3   rivaroxaban (XARELTO) 20 MG TABS tablet, TAKE 1 TABLET DAILY WITH   SUPPER, Disp: 90 tablet, Rfl: 1   triamcinolone cream (KENALOG) 0.1 %, Apply 1 application topically 2 (two) times daily., Disp: 30 g, Rfl: 1  Review of Systems:  Negative unless indicated in HPI.   Physical  Exam: Vitals:   03/12/23 0815  BP: 110/80  Pulse: 70  Temp: 98.1 F (36.7 C)  TempSrc: Oral  SpO2: 97%  Weight: 143 lb 6.4 oz (65 kg)  Height: 5\' 5"  (1.651 m)    Body mass index is 23.86 kg/m.   Physical Exam Vitals reviewed.  Constitutional:      General: She is not in acute distress.    Appearance: Normal appearance. She is not ill-appearing, toxic-appearing or diaphoretic.  HENT:     Head: Normocephalic.     Right Ear: Tympanic membrane, ear canal and external ear normal. There is no impacted cerumen.     Left Ear: Tympanic membrane, ear canal and external ear normal. There is no impacted cerumen.     Nose: Nose normal.     Mouth/Throat:     Mouth: Mucous membranes are moist.     Pharynx: Oropharynx is clear. No oropharyngeal exudate or posterior oropharyngeal erythema.  Eyes:     General: No scleral icterus.       Right eye: No discharge.        Left eye: No discharge.     Conjunctiva/sclera: Conjunctivae normal.     Pupils: Pupils are equal, round, and reactive to light.  Neck:     Vascular: No carotid bruit.  Cardiovascular:     Rate and Rhythm: Normal rate and regular rhythm.     Pulses: Normal pulses.     Heart sounds: Normal heart sounds.  Pulmonary:     Effort: Pulmonary effort is normal. No respiratory distress.     Breath sounds: Normal breath sounds.  Abdominal:     General: Abdomen is flat. Bowel sounds are normal.     Palpations: Abdomen is soft.  Musculoskeletal:        General: Normal range of motion.     Cervical back: Normal range of motion.  Skin:    General: Skin is warm and dry.  Neurological:     General: No focal deficit present.     Mental Status: She is alert and oriented to person, place, and time. Mental status is at baseline.  Psychiatric:        Mood and Affect: Mood normal.  Behavior: Behavior normal.        Thought Content: Thought content normal.        Judgment: Judgment normal.    Subsequent Medicare wellness  visit   1. Risk factors, based on past  M,S,F - Cardiac Risk Factors include: advanced age (>77men, >30 women)   2.  Physical activities: Dietary issues and exercise activities discussed:  Current Exercise Habits: Home exercise routine, Type of exercise: walking, Time (Minutes): 30, Frequency (Times/Week): 7, Weekly Exercise (Minutes/Week): 210, Intensity: Moderate   3.  Depression/mood:  Flowsheet Row Office Visit from 03/12/2023 in [facility] at [facility] Total Score 2        4.  ADL's:    03/12/2023    8:13 AM  In your present state of health, do you have any difficulty performing the following activities:  Hearing? 0  Vision? 0  Difficulty concentrating or making decisions? 0  Walking or climbing stairs? 1  Comment right hip and knee pain  Dressing or bathing? 0  Doing errands, shopping? 0  Preparing Food and eating ? N  Using the Toilet? N  In the past six months, have you accidently leaked urine? N  Do you have problems with loss of bowel control? N  Managing your Medications? N  Managing your Finances? N  Housekeeping or managing your Housekeeping? N     5.  Fall risk:     08/19/2020    3:52 PM 06/08/2021    1:21 PM 12/07/2021    8:52 AM 06/05/2022    8:37 AM 03/12/2023    8:38 AM  Fall Risk  Falls in the past year? 0 0 0  0  Was there an injury with Fall? 0 0 0  0  Fall Risk Category Calculator 0 0 0  0  Fall Risk Category (Retired) Low Low Low    (RETIRED) Patient Fall Risk Level    Low fall risk   Fall risk Follow up Falls evaluation completed  Falls evaluation completed  Falls evaluation completed     6.  Home safety: No problems identified   7.  Height weight, and visual acuity: height and weight as above, vision/hearing: Vision Screening   Right eye Left eye Both eyes  Without correction 20/25 20/25 20/25   With correction        8.  Counseling: Counseling given: Not Answered Tobacco comments: may smoke 2 cigarettes per  month    9. Lab orders based on risk factors: Laboratory update will be reviewed   10. Cognitive assessment:        03/12/2023    8:18 AM  6CIT Screen  What Year? 0 points  What month? 0 points  What time? 0 points  Count back from 20 0 points  Months in reverse 0 points  Repeat phrase 0 points  Total Score 0 points     11. Screening: Patient provided with a written and personalized 5-10 year screening schedule in the AVS. Health Maintenance  Topic Date Due   Pneumonia Vaccine (1 of 2 - PCV) Never done   Zoster (Shingles) Vaccine (1 of 2) Never done   DEXA scan (bone density measurement)  Never done   COVID-19 Vaccine (5 - 2023-24 season) 06/29/2022   Colon Cancer Screening  03/11/2024*   Flu Shot  05/30/2023   Medicare Annual Wellness Visit  03/11/2024   DTaP/Tdap/Td vaccine (2 - Td or Tdap) 01/20/2025   Mammogram  02/17/2025   Hepatitis C  Screening: USPSTF Recommendation to screen - Ages 20-79 yo.  Completed   HPV Vaccine  Aged Out  *Topic was postponed. The date shown is not the original due date.    12. Provider List Update: Patient Care Team    Relationship Specialty Notifications Start End  [name], [name], MD PCP - General Internal Medicine  04/20/21   Meriam Sprague, MD PCP - Cardiology Cardiology  04/03/22   Kennon Rounds Physician Assistant Cardiology  04/14/21      13. Advance Directives: Does Patient Have a Medical Advance Directive?: No Would patient like information on creating a medical advance directive?: Yes (Inpatient - patient defers creating a medical advance directive and declines information at this time)  14. Opioids: Patient is not on any opioid prescriptions and has no risk factors for a substance use disorder.   15.   Goals      Protect My Health     Timeframe:  Long-Range Goal Priority:  Medium Start Date:                             Expected End Date:                       Follow Up Date 03/11/2024    -  schedule and keep appointment for annual check-up    Why is this important?   Screening tests can find diseases early when they are easier to treat.  Your doctor or nurse will talk with you about which tests are important for you.  Getting shots for common diseases like the flu and shingles will help prevent them.     Notes:          I have personally reviewed and noted the following in the patient's chart:   Medical and social history Use of alcohol, tobacco or illicit drugs  Current medications and supplements Functional ability and status Nutritional status Physical activity Advanced directives List of other physicians Hospitalizations, surgeries, and ER visits in previous 12 months Vitals Screenings to include cognitive, depression, and falls Referrals and appointments  In addition, I have reviewed and discussed with patient certain preventive protocols, quality metrics, and best practice recommendations. A written personalized care plan for preventive services as well as general preventive health recommendations were provided to patient.   Impression and Plan:  Medicare annual wellness visit, subsequent  Mixed hyperlipidemia -     Lipid panel; Future -     AMB Referral to [facility] Coordinaton (ACO Patients)  Primary hypertension -     CBC with Differential/Platelet; Future -     Comprehensive metabolic panel; Future -     AMB Referral to [facility] Coordinaton (ACO Patients)  Paroxysmal atrial fibrillation (HCC) -     TSH; Future -     [facility] to [facility] ([facility] Patients)  Elevated liver function tests -     AMB Referral to [facility] Coordinaton (ACO Patients)  Screening for malignant neoplasm of colon -     Ambulatory referral to Gastroenterology  Encounter for screening for osteoporosis -     DG Bone Density; Future  Esophageal dysphagia -     Ambulatory referral to Gastroenterology  Paronychia of right thumb -      Doxycycline Hyclate; Take 1 tablet (100 mg total) by mouth 2 (two) times daily for 7 days.  Dispense: 14 tablet; Refill: 0    -  Recommend routine eye and dental care. -Healthy lifestyle discussed in detail. -Labs to be updated today. -Prostate cancer screening: N/A Health Maintenance  Topic Date Due   Pneumonia Vaccine (1 of 2 - PCV) Never done   Zoster (Shingles) Vaccine (1 of 2) Never done   DEXA scan (bone density measurement)  Never done   COVID-19 Vaccine (5 - 2023-24 season) 06/29/2022   Colon Cancer Screening  03/11/2024*   Flu Shot  05/30/2023   Medicare Annual Wellness Visit  03/11/2024   DTaP/Tdap/Td vaccine (2 - Td or Tdap) 01/20/2025   Mammogram  02/17/2025   Hepatitis C Screening: USPSTF Recommendation to screen - Ages 18-79 yo.  Completed   HPV Vaccine  Aged Out  *Topic was postponed. The date shown is not the original due date.    -Doxycycline will be sent for treatment of paronychia of her right thumb, is already draining spontaneously so I&D not required. -Advised to update shingles and RSV and COVID-vaccine at pharmacy. -Referral to GI will be sent for screening colonoscopy as well as for evaluation of dysphagia,?  Need for EGD with dilatation.     Chaya Jan, MD [facility] at [facility]

## 2023-03-13 ENCOUNTER — Encounter: Payer: No Typology Code available for payment source | Admitting: Internal Medicine

## 2023-03-24 ENCOUNTER — Encounter: Payer: Self-pay | Admitting: Internal Medicine

## 2023-03-27 ENCOUNTER — Telehealth: Payer: Self-pay

## 2023-03-27 NOTE — Progress Notes (Unsigned)
Care Management & Coordination Services Pharmacy Team  Reason for Encounter: Appointment Reminder  Contacted patient to confirm telephone appointment with Delano Metz, PharmD on 04/01/2023 at 10:00. {[facility] Outreach:[id number]}  Have you seen any other providers since your last visit? **{YES NO:22349}  Any changes in your medications or health? {YES NO:22349}  Any side effects from any medications? {YES NO:22349}  Do you have an symptoms or problems not managed by your medications? {YES NO:22349}  Any concerns about your health right now? {YES NO:22349}  Has your provider asked that you check blood pressure, blood sugar, or follow special diet at home? {YES NO:22349}  Do you get any type of exercise on a regular basis? {YES NO:22349}  Can you think of a goal you would like to reach for your health? ***  Do you have any problems getting your medications? {YES NO:22349}  Is there anything that you would like to discuss during the appointment? ***  Please bring medications and supplements to appointment    Chart review:  Recent office visits:  03/12/2023 [name], [name], MD - Patient was seen for Medicare annual wellness visit, subsequent and additional concerns. Started doxycycline 100 mg bid.   Recent consult visits:  02/13/2023 [name] (ortho) - Patient was seen for Osteoarthritis of right acromioclavicular joint and additional concerns.   10/11/2023 [name] PA-C (cardiology) - Patient was seen for HFmrEF and additional concerns. Discontinued Metoprolol.  Hospital visits:  Patient was seen at [facility] on 02/05/2023 (32 min) due to Acute pain of right shoulder .    New?Medications Started at [facility] Discharge:?? Baclofen 10 mg bid prn Medication Changes at Hospital Discharge: None Medications Discontinued at Hospital Discharge: Tizanidine  Medications that remain the same after Hospital Discharge:??  -All other medications will  remain the same.     Care Gaps: AWV - completed 03/12/2023 Last BP - 110/80 on 03/12/2023 Pneumovax - never done Shingrix - never done Dexa - never done Covid - overdue Colonoscopy - postponed  Star Rating Drugs:  Atorvastatin 40 mg - last filled 03/17/2023 [facility] Losartan 25 mg - last filled 02/28/2023 [facility]   Inetta Fermo [facility]  Clinical Pharmacist Assistant [phone]

## 2023-03-28 NOTE — Progress Notes (Unsigned)
Care Management & Coordination Services Pharmacy Note  03/28/2023 Name:  Alicia Bolton MRN:  [id number] DOB:  [birth year]  Summary: ***  Recommendations/Changes made from today's visit: ***  Follow up plan: ***   Subjective: Alicia Bolton is an 67 y.o. year old female who is a primary patient of Alicia Bolton, Alicia Patricia, MD.  The care coordination team was consulted for assistance with disease management and care coordination needs.    {CCMTELEPHONEFACETOFACE:21091510} for initial visit.  Recent office visits: 03/12/2023 Alicia Bolton, Alicia Patricia, MD - Patient was seen for Medicare annual wellness visit, subsequent and additional concerns. Started doxycycline 100 mg bid.     Recent consult visits: 02/13/2023 Alicia Bolton (ortho) - Patient was seen for Osteoarthritis of right acromioclavicular joint and additional concerns.    10/11/2023 Alicia Newcomer PA-C (cardiology) - Patient was seen for HFmrEF and additional concerns. Discontinued Metoprolol.  Hospital visits: 02/05/23 [facility] - For acute pain of right shoulder, LOS 32 min, START Baclofen BID prn   Objective:  Lab Results  Component Value Date   CREATININE 0.94 03/12/2023   BUN 15 03/12/2023   GFR 63.45 03/12/2023   EGFR 74 10/16/2022   GFRNONAA >60 04/09/2021   GFRAA >60 11/07/2016   NA 137 03/12/2023   K 3.6 03/12/2023   CALCIUM 9.4 03/12/2023   CO2 27 03/12/2023   GLUCOSE 100 (H) 03/12/2023    Lab Results  Component Value Date/Time   HGBA1C 6.1 12/07/2021 09:13 AM   HGBA1C 5.4 08/20/2017 09:13 AM   GFR 63.45 03/12/2023 08:54 AM   GFR 72.23 12/07/2021 09:13 AM    Last diabetic Eye exam: No results found for: "HMDIABEYEEXA"  Last diabetic Foot exam: No results found for: "HMDIABFOOTEX"   Lab Results  Component Value Date   CHOL 161 03/12/2023   HDL 50.30 03/12/2023   LDLCALC 92 03/12/2023   TRIG 93.0 03/12/2023   CHOLHDL 3 03/12/2023       Latest Ref Rng & Units 03/12/2023     8:54 AM 10/16/2022    8:07 AM 12/07/2021    9:13 AM  Hepatic Function  Total Protein 6.0 - 8.3 g/dL 7.5  6.7  7.7   Albumin 3.5 - 5.2 g/dL 4.0  4.0  3.8   AST 0 - 37 U/L 15  14  14    ALT 0 - 35 U/L 13  8  10    Alk Phosphatase 39 - 117 U/L 112  107  85   Total Bilirubin 0.2 - 1.2 mg/dL 1.1  1.0  0.8     Lab Results  Component Value Date/Time   TSH 0.81 03/12/2023 08:54 AM   TSH 1.16 09/17/2018 10:32 AM   FREET4 0.93 03/22/2015 11:50 AM       Latest Ref Rng & Units 03/12/2023    8:54 AM 10/16/2022    8:07 AM 12/07/2021    9:13 AM  CBC  WBC 4.0 - 10.5 K/uL 7.6  7.3  7.6   Hemoglobin 12.0 - 15.0 g/dL 40.9  81.1  91.4   Hematocrit 36.0 - 46.0 % 39.0  36.0  36.4   Platelets 150.0 - 400.0 K/uL 364.0  340  371.0     Lab Results  Component Value Date/Time   VITAMINB12 455 04/25/2016 08:20 AM   VITAMINB12 305 06/25/2013 02:11 PM    Clinical ASCVD: Yes  The ASCVD Risk score (Arnett DK, et al., 2019) failed to calculate for the following reasons:   The patient  has a prior MI or stroke diagnosis       03/12/2023    8:38 AM 12/07/2021    8:51 AM 06/08/2021    1:21 PM  Depression screen PHQ 2/9  Decreased Interest 0 0 0  Down, Depressed, Hopeless 0 0 0  PHQ - 2 Score 0 0 0  Altered sleeping 1 1 0  Tired, decreased energy 1 1 0  Change in appetite 0 0 0  Feeling bad or failure about yourself  0 0 0  Trouble concentrating 0 0 0  Moving slowly or fidgety/restless 0 0 0  Suicidal thoughts 0 0 0  PHQ-9 Score 2 2 0  Difficult doing work/chores  Not difficult at all Not difficult at all     Social History   Tobacco Use  Smoking Status Former   Types: Cigarettes   Quit date: 07/07/2014   Years since quitting: 8.7  Smokeless Tobacco Never  Tobacco Comments   may smoke 2 cigarettes per month   BP Readings from Last 3 Encounters:  03/12/23 110/80  02/05/23 126/86  10/10/22 120/76   Pulse Readings from Last 3 Encounters:  03/12/23 70  02/05/23 81  10/10/22 76   Wt  Readings from Last 3 Encounters:  03/12/23 143 lb 6.4 oz (65 kg)  10/10/22 146 lb 12.8 oz (66.6 kg)  04/03/22 145 lb (65.8 kg)   BMI Readings from Last 3 Encounters:  03/12/23 23.86 kg/m  10/10/22 23.69 kg/m  04/03/22 24.13 kg/m    Allergies  Allergen Reactions   Azithromycin Nausea And Vomiting   Floxin [Ofloxacin] Nausea And Vomiting    Makes sick to stomach and vomit   Other     "Not supposed to take Tylenol or ASA"   Penicillin G     Other reaction(s): Unknown   Penicillins Rash    Has patient had a PCN reaction causing immediate rash, facial/tongue/throat swelling, SOB or lightheadedness with hypotension: No Has patient had a PCN reaction causing severe rash involving mucus membranes or skin necrosis: No Has patient had a PCN reaction that required hospitalization No Has patient had a PCN reaction occurring within the last 10 years: No If all of the above answers are "NO", then may proceed with Cephalosporin use.    Spironolactone     BP increased; Headaches    Medications Reviewed Today     Reviewed by Alicia Bolton, Alicia Patricia, MD (Physician) on 03/12/23 at 0850  Med List Status: <None>   Medication Order Taking? Sig Documenting Provider Last Dose Status Informant  acyclovir (ZOVIRAX) 400 MG tablet [id number] Yes TAKE 1 TABLET TWICE A DAY Alicia Bolton, Alicia Patricia, MD Taking Active   Antiseborrheic Products, Misc. (PROMISEB) CREA [id number] Yes Apply 1 application topically as needed (for skin breakouts). [provider] Taking Active   atorvastatin (LIPITOR) 40 MG tablet [id number] Yes TAKE 1 TABLET DAILY Alicia Bolton, Alicia Patricia, MD Taking Active   baclofen 5 MG TABS [id number] Yes Take 2 tablets (10 mg total) by mouth 2 (two) times daily as needed for muscle spasms. Rising, Lurena Joiner, PA-C Taking Active   cloNIDine (CATAPRES) 0.1 MG tablet [id number] Yes TAKE 1 TABLET TWICE A DAY Alicia Bolton, Alicia Patricia, MD Taking Active   clotrimazole (LOTRIMIN) 1 %  cream [id number] Yes Apply 1 application topically 2 (two) times daily. To face [place], Velna Hatchet, MD Taking Active   doxycycline (VIBRA-TABS) 100 MG tablet [id number] Yes Take 1 tablet (100 mg total) by mouth 2 (two) times daily  for 7 days. Alicia Bolton, Alicia Patricia, MD  Active   hydrochlorothiazide (HYDRODIURIL) 25 MG tablet [id number] Yes Take 1 tablet (25 mg total) by mouth daily. Alicia Bolton T, PA-C Taking Active   HYDROcodone-acetaminophen [facility]) 10-325 MG tablet [id number] Yes Take 1 tablet by mouth every 8 (eight) hours as needed. Chronic Pain. Dx: N82.9 Alicia Bolton, Alicia Patricia, MD Taking Active   LORazepam (ATIVAN) 0.5 MG tablet [id number] Yes Take 1 tablet (0.5 mg total) by mouth 2 (two) times daily as needed. for anxiety Badger, Velna Hatchet, MD Taking Active   losartan (COZAAR) 25 MG tablet [id number] Yes Take 1 tablet (25 mg total) by mouth daily. Meriam Sprague, MD Taking Active   potassium chloride (KLOR-CON M10) 10 MEQ tablet [id number] Yes Take 1 tablet (10 mEq total) by mouth daily. Alicia Bolton T, PA-C Taking Active   rivaroxaban (XARELTO) 20 MG TABS tablet [id number] Yes TAKE 1 TABLET DAILY WITH   SUPPER Meriam Sprague, MD Taking Active   triamcinolone cream (KENALOG) 0.1 % [id number] Yes Apply 1 application topically 2 (two) times daily. [place], Velna Hatchet, MD Taking Active             SDOH:  (Social Determinants of Health) assessments and interventions performed: Yes SDOH Interventions    Flowsheet Row Office Visit from 03/12/2023 in [facility] [place]  SDOH Interventions   Food Insecurity Interventions Intervention Not Indicated  Housing Interventions Intervention Not Indicated  Utilities Interventions Intervention Not Indicated  Financial Strain Interventions Intervention Not Indicated  Stress Interventions Intervention Not Indicated       Medication Assistance: {MEDASSISTANCEINFO:25044}  Medication Access: Name and location of  current pharmacy:  [facility] [address] ([place]), [place] - [address] [place] ([place]) [place] [address] Phone: [phone] Fax: [phone]  [facility] [place], [place] - [address] [facility] Portal to Registered [facility] Sites [address] [place] [place] [address] Phone: [phone] Fax: [phone]  [facility] [address] [place] - [address] [place] [address] Phone: [phone] Fax: [phone]  Within the past 30 days, how often has patient missed a dose of medication? *** Is a pillbox or other method used to improve adherence? {YES/NO:21197} Factors that may affect medication adherence? {CHL DESC; BARRIERS:21522} Are meds synced by current pharmacy? {YES/NO:21197} Are meds delivered by current pharmacy? {YES/NO:21197} Does patient experience delays in picking up medications due to transportation concerns? {YES/NO:21197}  Compliance/Adherence/Medication fill history: Care Gaps: AWV - completed 03/12/2023 Last BP - 110/80 on 03/12/2023 Pneumovax - never done Shingrix - never done Dexa - never done - ordered Covid - overdue Colonoscopy - postponed  Star-Rating Drugs: Atorvastatin 40 mg - [facility] Losartan 25 mg - [facility]     Assessment/Plan Hypertension (BP goal <130/80) -Controlled -Current treatment: Clonidine 0.1mg  1 BID HCTZ 25mg  1 qam Losartan 25mg  1 qd -Medications previously tried: Metoprolol, Spironolactone  -Current home readings: *** -Current dietary habits: *** -Current exercise habits: *** -{ACTIONS;DENIES/REPORTS:21021675::"Denies"} hypotensive/hypertensive symptoms -Educated on {CCM BP Counseling:25124} -Counseled to monitor BP at home ***, document, and provide log at future appointments -{CCMPHARMDINTERVENTION:25122}  Hyperlipidemia: (LDL goal <  70) -Uncontrolled -Current treatment: Atorvastatin 40mg  1 qd -Medications previously tried: None  -Current dietary patterns: *** -Current exercise habits: *** -Educated on {CCM HLD Counseling:25126} -{CCMPHARMDINTERVENTION:25122}  Atrial Fibrillation (Goal: prevent stroke and major bleeding) -{US  controlled/uncontrolled:25276} -CHADSVASC: 7 -Current treatment: Xarelto 20mg  1 qd -Medications previously tried: Metoprolol -Home BP and HR readings: ***  -Counseled on {CCMAFIBCOUNSELING:25120} -{CCMPHARMDINTERVENTION:25122}  Depression/Anxiety (Goal: Well-controlled mood that still allows for ADLs) -Not assessed today -Current treatment: Lorazepam 0.5mg  1 BID prn   DDD (Goal: Pain control that still allows for ADLs) -Not assessed today -Current treatment  Norco 10-325mg  1 q8h prn Baclofen 5mg  BID prn  Query hypokalemia (Goal: K WNL) -Not assessed today -Current treatment  Klor-Con M10 1 qd  OTC  -Current treatment  ***    Sherrill Raring Clinical Pharmacist [phone]

## 2023-04-09 ENCOUNTER — Ambulatory Visit (HOSPITAL_COMMUNITY): Payer: No Typology Code available for payment source | Attending: Cardiology

## 2023-04-09 DIAGNOSIS — I5020 Unspecified systolic (congestive) heart failure: Secondary | ICD-10-CM | POA: Diagnosis not present

## 2023-04-09 LAB — ECHOCARDIOGRAM COMPLETE
Area-P 1/2: 3.97 cm2
S' Lateral: 3.5 cm

## 2023-04-10 ENCOUNTER — Encounter: Payer: Self-pay | Admitting: Physician Assistant

## 2023-04-17 ENCOUNTER — Other Ambulatory Visit: Payer: Self-pay | Admitting: Internal Medicine

## 2023-04-19 NOTE — Progress Notes (Unsigned)
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 female returns for follow-up of atrial fibrillation, CHF.  She was last seen in December 2023.  Most recent follow-up echocardiogram demonstrated somewhat improved LV function with EF 50-55.  She is here alone.  She has been doing well without shortness of breath, orthopnea, leg edema, syncope.  She does note occasional episodes of chest discomfort.  She can feel this at any time.  Drinking water usually helps with resolution.  She has no associated symptoms.  She has felt this while walking at times.  She also notes difficulty swallowing.  She has been referred to gastroenterology for colonoscopy/endoscopy.  Review of Systems  Gastrointestinal:  Negative for hematochezia and melena.       Studies Reviewed:   EKG Interpretation  Date/Time:  Tuesday April 23 2023 08:48:23 EDT Ventricular Rate:  76 PR Interval:  144 QRS Duration: 86 QT Interval:  402 QTC Calculation: 452 R Axis:   -30 Text  Interpretation: Normal sinus rhythm Left axis deviation No ST-TW changes. No change when compared to prior ECGs. Confirmed by [name] [phone]) on 04/23/2023 8:53:17 AM    Risk Assessment/Calculations:    CHA2DS2-VASc Score = 6   This indicates a 9.7% annual risk of stroke. The patient's score is based upon: CHF History: 1 HTN History: 1 Diabetes History: 0 Stroke History: 2 Vascular Disease History: 0 Age Score: 1 Gender Score: 1            Physical Exam:   VS:  BP 104/62   Pulse 76   Ht 5\' 6"  (1.676 m)   Wt 140 lb 6.4 oz (63.7 kg)   SpO2 95%   BMI 22.66 kg/m    Wt Readings from Last 3 Encounters:  04/23/23 140 lb 6.4 oz (63.7 kg)  03/12/23 143 lb 6.4 oz (65 kg)  10/10/22 146 lb 12.8 oz (66.6 kg)    Constitutional:      Appearance: Healthy appearance. Not in distress.  Neck:     Vascular: JVD normal.  Pulmonary:     Breath sounds: Normal breath sounds. No wheezing. No rales.  Cardiovascular:     Normal rate. Regular rhythm.     Murmurs: There is no murmur.  Edema:    Peripheral edema absent.  Abdominal:     Palpations: Abdomen is soft.       ASSESSMENT AND PLAN:   Precordial chest pain Her chest discomfort mostly sounds consistent with GI etiology (non-cardiac).  She does get some exertional symptoms (possibly cardiac).  She has never  had cardiac catheterization or coronary CTA.  Myoview in 2021 was low risk.  EKG today does not demonstrate any acute changes.  I have recommended proceeding with a plain exercise treadmill test to rule out ischemia.  As long as this is low risk, she will not require further testing.  HFmrEF (heart failure with mildly reduced EF) Probable Non-ischemic cardiomyopathy. EF improved to 50-55. NYHA. She could not tolerate beta-blocker due to alopecia.  She could not tolerate spironolactone due to uncontrolled blood pressure.  Continue losartan 25 mg daily.  At this, I do not think she needs addition of SGLT2 inhibitor.  I will  continue to follow her along with Dr. Raynelle Jan given Dr. Devin Going departure.  Follow-up 6 months.  Paroxysmal atrial fibrillation (HCC) Maintaining sinus rhythm.  She is tolerating anticoagulation.  Primary care labs reviewed from May 2024.  Her hemoglobin was normal at 13.4, creatinine normal at 0.94.  Her calculated creatinine clearance is 59.  Continue Xarelto 20 mg daily.  Essential hypertension Blood pressure is well-controlled.  Continue clonidine 0.1 mg twice daily, HCTZ 25 mg daily, losartan 25 mg daily.  Hyperlipidemia Primary care labs reviewed.  Patient's LDL is optimal on current dose of atorvastatin.    Informed Consent   Shared Decision Making/Informed Consent The risks [chest pain, shortness of breath, cardiac arrhythmias, dizziness, blood pressure fluctuations, myocardial infarction, stroke/transient ischemic attack, and life-threatening complications (estimated to be 1 in 10,000)], benefits (risk stratification, diagnosing coronary artery disease, treatment guidance) and alternatives of an exercise tolerance test were discussed in detail with Ms. Branton and she agrees to proceed.     Dispo:  Return in about 6 months (around 10/23/2023) for Routine Follow Up, w/ [name], PA-C.  Signed, [name], PA-C

## 2023-04-23 ENCOUNTER — Ambulatory Visit: Payer: No Typology Code available for payment source | Attending: Physician Assistant | Admitting: Physician Assistant

## 2023-04-23 ENCOUNTER — Encounter: Payer: Self-pay | Admitting: Physician Assistant

## 2023-04-23 VITALS — BP 104/62 | HR 76 | Ht 66.0 in | Wt 140.4 lb

## 2023-04-23 DIAGNOSIS — I10 Essential (primary) hypertension: Secondary | ICD-10-CM

## 2023-04-23 DIAGNOSIS — E782 Mixed hyperlipidemia: Secondary | ICD-10-CM

## 2023-04-23 DIAGNOSIS — I480 Paroxysmal atrial fibrillation: Secondary | ICD-10-CM

## 2023-04-23 DIAGNOSIS — I5020 Unspecified systolic (congestive) heart failure: Secondary | ICD-10-CM | POA: Diagnosis not present

## 2023-04-23 DIAGNOSIS — R072 Precordial pain: Secondary | ICD-10-CM | POA: Diagnosis not present

## 2023-04-23 MED ORDER — ATORVASTATIN CALCIUM 40 MG PO TABS: 40.0000 mg | ORAL_TABLET | Freq: Every day | ORAL | 3 refills | Status: DC

## 2023-04-23 MED ORDER — POTASSIUM CHLORIDE CRYS ER 10 MEQ PO TBCR: 10.0000 meq | EXTENDED_RELEASE_TABLET | Freq: Every day | ORAL | 3 refills | Status: DC

## 2023-04-23 MED ORDER — LOSARTAN POTASSIUM 25 MG PO TABS: 25.0000 mg | ORAL_TABLET | Freq: Every day | ORAL | 3 refills | Status: DC

## 2023-04-23 MED ORDER — CLONIDINE HCL 0.1 MG PO TABS: 0.1000 mg | ORAL_TABLET | Freq: Two times a day (BID) | ORAL | 3 refills | Status: DC

## 2023-04-23 MED ORDER — HYDROCHLOROTHIAZIDE 25 MG PO TABS: 25.0000 mg | ORAL_TABLET | Freq: Every day | ORAL | 3 refills | Status: DC

## 2023-04-23 MED ORDER — RIVAROXABAN 20 MG PO TABS
ORAL_TABLET | ORAL | 3 refills | Status: DC
Start: 2023-04-23 — End: 2023-06-20

## 2023-04-23 NOTE — Assessment & Plan Note (Signed)
Her chest discomfort mostly sounds consistent with GI etiology (non-cardiac).  She does get some exertional symptoms (possibly cardiac).  She has never had cardiac catheterization or coronary CTA.  Myoview in 2021 was low risk.  EKG today does not demonstrate any acute changes.  I have recommended proceeding with a plain exercise treadmill test to rule out ischemia.  As long as this is low risk, she will not require further testing.

## 2023-04-23 NOTE — Assessment & Plan Note (Signed)
Primary care labs reviewed.  Patient's LDL is optimal on current dose of atorvastatin.

## 2023-04-23 NOTE — Patient Instructions (Addendum)
Medication Instructions:  Your physician recommends that you continue on your current medications as directed. Please refer to the Current Medication list given to you today.  *If you need a refill on your cardiac medications before your next appointment, please call your pharmacy*   Lab Work: None ordered  If you have labs (blood work) drawn today and your tests are completely normal, you will receive your results only by: MyChart Message (if you have MyChart) OR A paper copy in the mail If you have any lab test that is abnormal or we need to change your treatment, we will call you to review the results.   Testing/Procedures: Your physician has requested that you have an exercise tolerance test. For further information please visit [email or web address] Please also follow instruction sheet, BELOW:  - DO NOT TAKE YOUR HCTZ OR LOSARTAN  MEDICATION THE MORNING OF THE STRESS TEST.  BRING IT WITH YOU TO YOUR APPOINTMENT.  - DO NOT EAT, DRINK, OR USE TOBACCO PRODUCTS WITHIN 4 HOURS OF THE TEST - DRESS PREPARED TO EXERCISE IN A COMFORTABLE, 2 PIECE CLOTHING OUTFIT AND WALKING SHOES - BRING ANY PRESCRIPTION MEDICATIONS WITH YOU  - NOTIFY THE OFFICE 24 HOURS IN ADVANCE IF YOU NEED TO CANCEL - CALL THE OFFICE [phone] IF YOU HAVE ANY QUESTIONS     Follow-Up: At [facility], you and your health needs are our priority.  As part of our continuing mission to provide you with exceptional heart care, we have created designated Provider Care Teams.  These Care Teams include your primary Cardiologist (physician) and Advanced Practice Providers (APPs -  Physician Assistants and Nurse Practitioners) who all work together to provide you with the care you need, when you need it.  We recommend signing up for the patient portal called "MyChart".  Sign up information is provided on this After Visit Summary.  MyChart is used to connect with patients for Virtual Visits (Telemedicine).  Patients are  able to view lab/test results, encounter notes, upcoming appointments, etc.  Non-urgent messages can be sent to your provider as well.   To learn more about what you can do with MyChart, go to [email or web address].    Your next appointment:   6 month(s)  Provider:   [name], PA-C         Other Instructions

## 2023-04-23 NOTE — Assessment & Plan Note (Signed)
Blood pressure is well-controlled.  Continue clonidine 0.1 mg twice daily, HCTZ 25 mg daily, losartan 25 mg daily.

## 2023-04-23 NOTE — Assessment & Plan Note (Signed)
Maintaining sinus rhythm.  She is tolerating anticoagulation.  Primary care labs reviewed from May 2024.  Her hemoglobin was normal at 13.4, creatinine normal at 0.94.  Her calculated creatinine clearance is 59.  Continue Xarelto 20 mg daily.

## 2023-04-23 NOTE — Assessment & Plan Note (Signed)
Probable Non-ischemic cardiomyopathy. EF improved to 50-55. NYHA. She could not tolerate beta-blocker due to alopecia.  She could not tolerate spironolactone due to uncontrolled blood pressure.  Continue losartan 25 mg daily.  At this, I do not think she needs addition of SGLT2 inhibitor.  I will continue to follow her along with Dr. Raynelle Jan given Dr. Devin Going departure.  Follow-up 6 months.

## 2023-05-07 ENCOUNTER — Ambulatory Visit (INDEPENDENT_AMBULATORY_CARE_PROVIDER_SITE_OTHER): Payer: No Typology Code available for payment source | Admitting: Gastroenterology

## 2023-05-07 ENCOUNTER — Encounter: Payer: Self-pay | Admitting: Gastroenterology

## 2023-05-07 VITALS — BP 110/70 | HR 84 | Ht 64.0 in | Wt 140.5 lb

## 2023-05-07 DIAGNOSIS — R1310 Dysphagia, unspecified: Secondary | ICD-10-CM

## 2023-05-07 DIAGNOSIS — K219 Gastro-esophageal reflux disease without esophagitis: Secondary | ICD-10-CM | POA: Insufficient documentation

## 2023-05-07 DIAGNOSIS — R1013 Epigastric pain: Secondary | ICD-10-CM | POA: Diagnosis not present

## 2023-05-07 DIAGNOSIS — Z86010 Personal history of colonic polyps: Secondary | ICD-10-CM | POA: Diagnosis not present

## 2023-05-07 DIAGNOSIS — Z7901 Long term (current) use of anticoagulants: Secondary | ICD-10-CM

## 2023-05-07 MED ORDER — NA SULFATE-K SULFATE-MG SULF 17.5-3.13-1.6 GM/177ML PO SOLN: 1.0000 | Freq: Once | ORAL | 0 refills | Status: AC

## 2023-05-07 NOTE — Progress Notes (Signed)
I agree with the assessment and plan as outlined by Ms. Zehr. 

## 2023-05-07 NOTE — Progress Notes (Signed)
05/07/2023 Alicia Bolton [id number] 1956/02/03   HISTORY OF PRESENT ILLNESS: This is a 67 year old female who was previously seen by Dr. Orvan Falconer.  She prefers a female physician so her care will be assigned to Dr. Leonides Schanz.  When she last saw Dr. Orvan Falconer in 2021 she was scheduled for colonoscopy, but canceled due to the ongoing state of COVID at that time.  Her last colonoscopy was 07/2010 at which time she had a diminutive tubular adenoma removed.  She would like to reschedule the colonoscopy, but has also been having some upper GI issues.  She says that anytime that she eats or drinks anything it feels like there is an area in her esophagus where it gets stuck and hurts and causes her to have hiccups, but once it passes through that area it is just fine.  She also describes a second sensation that she describes as a strong pain in the epigastrium that comes on randomly.  She says that she can be sitting or walking, but when she feels it come on if she can get something to drink that will soothe it and calm it down.  She is not on any medication for heartburn or reflux and does not feel like that is the issue.  She is on Xarelto for paroxysmal atrial fibrillation and follows with [name], PA-C and Dr. Shari Prows.  She denies any issues with her moving her bowels.  No rectal bleeding.  Past Medical History:  Diagnosis Date   Anxiety    takes Ativan daily as needed   Arthritis    Carpal tunnel syndrome 07/17/2013   Chest mass 04/17/2015   Chronic migraine 02/08/2016   DDD (degenerative disc disease), lumbar 04/17/2017   Dilated cardiomyopathy (HCC) 12/09/2019   EF 45-50 by echocardiogram // Myoview 11/2019: EF 46, no ischemia, mild apical thinning artifact; Low Risk // Echo 4/23: EF 45-50, no RWMA, GR 1 DD, GLS -15.2, normal RVSF, normal PASP, RVSP 25.3, mild MR, AV sclerosis without stenosis // TTE 04/09/2023: EF 50-55, no RWMA, GR 1 DD, mildly reduced RVSF, mild LAE, mild MR, AV sclerosis,  RAP 3, average GLS -16.9   DISORDER, TOBACCO USE 06/27/2007   Qualifier: Diagnosis of  By: Reche Dixon MD, David     Elevated liver function tests 10/04/2014   Fibromyalgia    GAD (generalized anxiety disorder) 10/10/2017   History of bronchitis    few yrs ago   History of colon polyps    benign   History of migraine    last one over a month ago   Hyperlipidemia    takes Atorvastatin daily   Hypertension    takes Clonidine and HCTZ daily   Joint pain    Migraines    Muscle spasms of neck 06/06/2016   Neuromuscular disorder (HCC)    Neuropathy 04/25/2016   Nocturia    PAF (paroxysmal atrial fibrillation) (HCC) 09/17/2018   Recurrent boils 04/25/2016   Spinal stenosis of cervical region 10/25/2014   Stroke (HCC) 09/2015   Embolic Right ACA,  ILR placed Dr. Johney Frame.States slight weakness in left   Tear of medial meniscus of right knee 10/09/2013   Past Surgical History:  Procedure Laterality Date   ABDOMINAL HYSTERECTOMY  1997   ANTERIOR CERVICAL DECOMP/DISCECTOMY FUSION N/A 10/25/2014   Procedure: CERVICAL THREE-FOUR, CERVICAL FOUR FIVE ANTERIOR CERVICAL DECOMPRESSION/DISCECTOMY FUSION 2 LEVEL/HARDWARE REMOVALCERVICAL FIVE-SEVEN;  Surgeon: Mariam Dollar, MD;  Location: [facility];  Service: Neurosurgery;  Laterality: N/A;   BREAST  DUCTAL SYSTEM EXCISION Left 11/14/2016   Procedure: EXCISION DUCTAL SYSTEM LEFT BREAST;  Surgeon: Chevis Pretty III, MD;  Location: [facility];  Service: General;  Laterality: Left;   BREAST EXCISIONAL BIOPSY Right    benign   BREAST EXCISIONAL BIOPSY Left    benign   CARPAL TUNNEL RELEASE Left 10/25/2014   Procedure: CARPAL TUNNEL RELEASE;  Surgeon: Mariam Dollar, MD;  Location: [facility];  Service: Neurosurgery;  Laterality: Left;   CARPAL TUNNEL RELEASE Right    CERVICAL FUSION  2007   CESAREAN SECTION  1985   CHEST SURGERY  04/2015   Benign chest mass removed- [facility]    CHOLECYSTECTOMY  1998   COLONOSCOPY     EP IMPLANTABLE DEVICE N/A 10/06/2015    Procedure: Loop Recorder Insertion;  Surgeon: Hillis Range, MD;  Location: [facility];  Service: Cardiovascular;  Laterality: N/A;   implantable loop recorder removal  08/10/2019   Medtronic Reveal LINQ removed in office by Dr Johney Frame   KNEE ARTHROSCOPY Right 1999   KNEE ARTHROSCOPY WITH MEDIAL MENISECTOMY Right 10/09/2013   Procedure: RIGHT KNEE ARTHROSCOPY WITH MEDIAL MENISECTOMY;  Surgeon: Eulas Post, MD;  Location: [facility];  Service: Orthopedics;  Laterality: Right;  clean   TEE WITHOUT CARDIOVERSION N/A 10/05/2015   Procedure: TRANSESOPHAGEAL ECHOCARDIOGRAM (TEE);  Surgeon: Vesta Mixer, MD;  Location: [facility];  Service: Cardiovascular;  Laterality: N/A;   thumb surgery Right     reports that she quit smoking about 8 years ago. Her smoking use included cigarettes. She has never used smokeless tobacco. She reports that she does not currently use alcohol. She reports that she does not use drugs. family history includes Cancer in her maternal grandmother; Colon polyps in her maternal grandmother; Diabetes in her mother; Hypertension in her mother; Stroke in her maternal grandmother. Allergies  Allergen Reactions   Azithromycin Nausea And Vomiting   Baclofen Nausea And Vomiting   Other     "Not supposed to take Tylenol or ASA"   Ofloxacin Nausea And Vomiting and Rash    Makes sick to stomach and vomit  Other Reaction(s): GI Intolerance   Penicillin G Rash    Other reaction(s): Unknown  Other Reaction(s): GI Intolerance   Penicillins Rash    Has patient had a PCN reaction causing immediate rash, facial/tongue/throat swelling, SOB or lightheadedness with hypotension: No Has patient had a PCN reaction causing severe rash involving mucus membranes or skin necrosis: No Has patient had a PCN reaction that required hospitalization No Has patient had a PCN reaction occurring within the last 10 years: No If all of the above answers are "NO", then may  proceed with Cephalosporin use.    Spironolactone     BP increased; Headaches      Outpatient Encounter Medications as of 05/07/2023  Medication Sig   acyclovir (ZOVIRAX) 400 MG tablet TAKE 1 TABLET TWICE A DAY   Antiseborrheic Products, Misc. (PROMISEB) CREA Apply 1 application topically as needed (for skin breakouts).   atorvastatin (LIPITOR) 40 MG tablet Take 1 tablet (40 mg total) by mouth daily.   baclofen 5 MG TABS Take 2 tablets (10 mg total) by mouth 2 (two) times daily as needed for muscle spasms.   cloNIDine (CATAPRES) 0.1 MG tablet Take 1 tablet (0.1 mg total) by mouth 2 (two) times daily.   clotrimazole (LOTRIMIN) 1 % cream Apply 1 application topically 2 (two) times daily. To face   hydrochlorothiazide (HYDRODIURIL) 25 MG  tablet Take 1 tablet (25 mg total) by mouth daily.   HYDROcodone-acetaminophen (NORCO) 10-325 MG tablet Take 1 tablet by mouth every 8 (eight) hours as needed. Chronic Pain. Dx: G89.4   LORazepam (ATIVAN) 0.5 MG tablet Take 1 tablet (0.5 mg total) by mouth 2 (two) times daily as needed. for anxiety   losartan (COZAAR) 25 MG tablet Take 1 tablet (25 mg total) by mouth daily.   potassium chloride (KLOR-CON M10) 10 MEQ tablet Take 1 tablet (10 mEq total) by mouth daily.   rivaroxaban (XARELTO) 20 MG TABS tablet TAKE 1 TABLET DAILY WITH   SUPPER   triamcinolone cream (KENALOG) 0.1 % Apply 1 application topically 2 (two) times daily.   No facility-administered encounter medications on file as of 05/07/2023.    REVIEW OF SYSTEMS  : All other systems reviewed and negative except where noted in the History of Present Illness.   PHYSICAL EXAM: BP 110/70 (BP Location: Left Arm, Patient Position: Sitting, Cuff Size: Normal)   Pulse 84   Ht 5\' 4"  (1.626 m)   Wt 140 lb 8 oz (63.7 kg)   BMI 24.12 kg/m  General: Well developed female in no acute distress Head: Normocephalic and atraumatic Eyes:  Sclerae anicteric, conjunctiva pink. Ears: Normal auditory  acuity Lungs: Clear throughout to auscultation; no W/R/R. Heart: Regular rate and rhythm; no M/R/G. Abdomen: Soft, non-distended.  BS present.  Mild epigastric TTP. Rectal:  Will be done at the time of colonoscopy. Musculoskeletal: Symmetrical with no gross deformities  Skin: No lesions on visible extremities Extremities: No edema  Neurological: Alert oriented x 4, grossly non-focal Psychological:  Alert and cooperative. Normal mood and affect  ASSESSMENT AND PLAN: *Personal history of colon polyps: Last colonoscopy was October 2011 with a tubular adenoma removed.  Will plan for colonoscopy with Dr. Leonides Schanz.  Patient prefers female physician. *Epigastric abdominal pain and dysphagia: Describes 2 different sensations, one in a certain area that feels like food and drink gets stuck and causes her to have hiccups, but then eventually goes down just fine once it passes that area and then a sharp pain in the epigastrium that gets better if she tries to drink something.  That occurs randomly.  Will evaluate both of those with EGD.  She does not feel like it is heartburn or reflux related. *Chronic anticoagulation with Xarelto due to paroxysmal atrial fibrillation:  Will hold Xaretlo for 2 days prior to endoscopic procedures - will instruct when and how to resume after procedure. Benefits and risks of procedure explained including risks of bleeding, perforation, infection, missed lesions, reactions to medications and possible need for hospitalization and surgery for complications. Additional rare but real risk of stroke or other vascular clotting events off of Xarelto also explained and need to seek urgent help if any signs of these problems occur. Will communicate by phone or EMR with patient's prescribing provider to confirm that holding Xarelto is reasonable in this case.     CC:  [name], [place]*

## 2023-05-07 NOTE — Patient Instructions (Addendum)
You will be contacted by our office prior to your procedure for directions on holding your Xarelto.  If you do not hear from our office 1 week prior to your scheduled procedure, please call [phone] to discuss.    You have been scheduled for an endoscopy and colonoscopy. Please follow the written instructions given to you at your visit today.  Please pick up your prep supplies at the pharmacy within the next 1-3 days.  If you use inhalers (even only as needed), please bring them with you on the day of your procedure.  DO NOT TAKE 7 DAYS PRIOR TO TEST- Trulicity (dulaglutide) Ozempic, Wegovy (semaglutide) Mounjaro (tirzepatide) Bydureon Bcise (exanatide extended release)  DO NOT TAKE 1 DAY PRIOR TO YOUR TEST Rybelsus (semaglutide) Adlyxin (lixisenatide) Victoza (liraglutide) Byetta (exanatide)  _______________________________________________________  If your blood pressure at your visit was 140/90 or greater, please contact your primary care physician to follow up on this.  _______________________________________________________  If you are age 52 or older, your body mass index should be between 23-30. Your Body mass index is 24.12 kg/m. If this is out of the aforementioned range listed, please consider follow up with your Primary Care Provider.  If you are age 31 or younger, your body mass index should be between 19-25. Your Body mass index is 24.12 kg/m. If this is out of the aformentioned range listed, please consider follow up with your Primary Care Provider.   ________________________________________________________  The [facility] providers would like to encourage you to use [facility] to communicate with providers for non-urgent requests or questions.  Due to long hold times on the telephone, sending your provider a message by [facility] may be a faster and more efficient way to get a response.  Please allow 48 business hours for a response.  Please remember that this is for  non-urgent requests.  _______________________________________________________

## 2023-05-08 ENCOUNTER — Ambulatory Visit: Payer: No Typology Code available for payment source | Attending: Physician Assistant

## 2023-05-08 DIAGNOSIS — R072 Precordial pain: Secondary | ICD-10-CM | POA: Diagnosis not present

## 2023-05-10 ENCOUNTER — Encounter: Payer: Self-pay | Admitting: Physician Assistant

## 2023-05-10 DIAGNOSIS — Z9289 Personal history of other medical treatment: Secondary | ICD-10-CM | POA: Insufficient documentation

## 2023-05-10 LAB — EXERCISE TOLERANCE TEST
Angina Index: 0
Base ST Depression (mm): 0 mm
Duke Treadmill Score: 5
Estimated workload: 6.4
Exercise duration (min): 4 min
Exercise duration (sec): 35 s
MPHR: 154 {beats}/min
Peak HR: 164 {beats}/min
Percent HR: 106 %
RPE: 15
Rest HR: 78 {beats}/min
ST Depression (mm): 0 mm

## 2023-05-15 ENCOUNTER — Telehealth: Payer: Self-pay | Admitting: *Deleted

## 2023-05-15 NOTE — Telephone Encounter (Signed)
[facility] Pre-operative Risk Assessment     Request for surgical clearance:     Endoscopy Procedure  What type of surgery is being performed?     EGD/Colon  When is this surgery scheduled?     06/26/23  What type of clearance is required ?   Pharmacy  Are there any medications that need to be held prior to surgery and how long? Xarelto 2 days  Practice name and name of physician performing surgery?      [facility]  What is your office phone and fax number?      Phone- [phone]  Fax- [phone]  Anesthesia type (None, local, MAC, general) ?       MAC

## 2023-05-15 NOTE — Telephone Encounter (Signed)
     Primary Cardiologist: Christell Constant, MD  Chart reviewed as part of pre-operative protocol coverage. Given past medical history and time since last visit, based on ACC/AHA guidelines, Alicia Bolton would be at acceptable risk for the planned procedure without further cardiovascular testing.   Patient with diagnosis of afib on Xarelto for anticoagulation.     Procedure: EGD/colonoscopy Date of procedure: 06/26/23   CHA2DS2-VASc Score = 7  This indicates a 11.2% annual risk of stroke. The patient's score is based upon: CHF History: 1 HTN History: 1 Diabetes History: 0 Stroke History: 2 Vascular Disease History: 1 Age Score: 1 Gender Score: 1   Stroke in 2016   CrCl 45mL/min Platelet count 364K   Prefer 1 day Xarelto hold prior to procedure given elevated CV risk including prior stroke. Pt should resume anticoag as soon as safely possible after.     I will route this recommendation to the requesting party via Epic fax function and remove from pre-op pool.  Please call with questions.  Thomasene Ripple. Manroop Jakubowicz NP-C     05/15/2023, 4:39 PM [facility] [address] Office [phone] Fax [phone]

## 2023-05-15 NOTE — Telephone Encounter (Signed)
Patient with diagnosis of afib on Xarelto for anticoagulation.    Procedure: EGD/colonoscopy Date of procedure: 06/26/23  CHA2DS2-VASc Score = 7  This indicates a 11.2% annual risk of stroke. The patient's score is based upon: CHF History: 1 HTN History: 1 Diabetes History: 0 Stroke History: 2 Vascular Disease History: 1 Age Score: 1 Gender Score: 1  Stroke in 2016  CrCl 15mL/min Platelet count 364K  Prefer 1 day Xarelto hold prior to procedure given elevated CV risk including prior stroke. Pt should resume anticoag as soon as safely possible after.  **This guidance is not considered finalized until pre-operative APP has relayed final recommendations.**

## 2023-05-24 NOTE — Telephone Encounter (Signed)
Patient informed of clearance approval.

## 2023-05-26 ENCOUNTER — Other Ambulatory Visit: Payer: Self-pay | Admitting: Internal Medicine

## 2023-05-26 DIAGNOSIS — M2550 Pain in unspecified joint: Secondary | ICD-10-CM

## 2023-05-27 ENCOUNTER — Encounter: Payer: Self-pay | Admitting: Internal Medicine

## 2023-05-27 DIAGNOSIS — M2550 Pain in unspecified joint: Secondary | ICD-10-CM

## 2023-05-27 MED ORDER — HYDROCODONE-ACETAMINOPHEN 10-325 MG PO TABS
1.0000 | ORAL_TABLET | Freq: Three times a day (TID) | ORAL | 0 refills | Status: DC | PRN
Start: 2023-05-27 — End: 2023-05-28

## 2023-05-27 NOTE — Telephone Encounter (Signed)
Patient states she takes pain medication for her neck, head, and shoulder pain as needed.  She has had pain this weekend.

## 2023-05-28 DIAGNOSIS — M7542 Impingement syndrome of left shoulder: Secondary | ICD-10-CM | POA: Diagnosis not present

## 2023-05-28 MED ORDER — HYDROCODONE-ACETAMINOPHEN 10-325 MG PO TABS
1.0000 | ORAL_TABLET | Freq: Three times a day (TID) | ORAL | 0 refills | Status: DC | PRN
Start: 2023-05-28 — End: 2024-01-22

## 2023-06-16 ENCOUNTER — Encounter: Payer: Self-pay | Admitting: Internal Medicine

## 2023-06-17 NOTE — Telephone Encounter (Signed)
Last tdap 12/2514

## 2023-06-18 ENCOUNTER — Encounter (HOSPITAL_COMMUNITY): Payer: Self-pay

## 2023-06-18 ENCOUNTER — Ambulatory Visit (HOSPITAL_COMMUNITY)
Admission: RE | Admit: 2023-06-18 | Discharge: 2023-06-18 | Disposition: A | Discharge status: Home / Self Care | Payer: No Typology Code available for payment source | Source: Ambulatory Visit | Attending: Nurse Practitioner | Admitting: Nurse Practitioner

## 2023-06-18 VITALS — BP 112/73 | HR 86 | Temp 98.4°F | Resp 15

## 2023-06-18 DIAGNOSIS — Z23 Encounter for immunization: Secondary | ICD-10-CM

## 2023-06-18 DIAGNOSIS — S60419A Abrasion of unspecified finger, initial encounter: Secondary | ICD-10-CM | POA: Diagnosis not present

## 2023-06-18 MED ORDER — TETANUS-DIPHTH-ACELL PERTUSSIS 5-2.5-18.5 LF-MCG/0.5 IM SUSY
0.5000 mL | PREFILLED_SYRINGE | Freq: Once | INTRAMUSCULAR | Status: AC
  Administered 2023-06-18: 0.5 mL via INTRAMUSCULAR

## 2023-06-18 MED ORDER — TETANUS-DIPHTH-ACELL PERTUSSIS 5-2.5-18.5 LF-MCG/0.5 IM SUSY
PREFILLED_SYRINGE | INTRAMUSCULAR | Status: AC
  Filled 2023-06-18: qty 0.5

## 2023-06-18 NOTE — Discharge Instructions (Addendum)
We have updated tetanus shot today.    Please keep the wound clean and dry.  Seek care if signs or symptoms of infection develop.

## 2023-06-18 NOTE — ED Provider Notes (Addendum)
[facility]    CSN: [id number] Arrival date & time: 06/18/23  1214      History   Chief Complaint Chief Complaint  Patient presents with   other    Appt 1230 need Tdap    Finger Injury    HPI Alicia Bolton is a 67 y.o. female.   Patient presents today requesting tetanus shot.  Reports on Sunday, she was cutting raw chicken and accidentally "nicked" her finger with a knife.  She reports she cleaned the area with warm soap and water and then poured alcohol on the area.  She denies drainage or oozing from the abrasion.  She thinks it is all the way healed.  Denies decreased range of motion to the finger and denies numbness or tingling in the fingertip.    Past Medical History:  Diagnosis Date   Anxiety    takes Ativan daily as needed   Arthritis    Carpal tunnel syndrome 07/17/2013   Chest mass 04/17/2015   Chronic migraine 02/08/2016   DDD (degenerative disc disease), lumbar 04/17/2017   Dilated cardiomyopathy (HCC) 12/09/2019   EF 45-50 by echocardiogram // Myoview 11/2019: EF 46, no ischemia, mild apical thinning artifact; Low Risk // Echo 4/23: EF 45-50, no RWMA, GR 1 DD, GLS -15.2, normal RVSF, normal PASP, RVSP 25.3, mild MR, AV sclerosis without stenosis // TTE 04/09/2023: EF 50-55, no RWMA, GR 1 DD, mildly reduced RVSF, mild LAE, mild MR, AV sclerosis, RAP 3, average GLS -16.9   DISORDER, TOBACCO USE 06/27/2007   Qualifier: Diagnosis of  By: Reche Dixon MD, David     Elevated liver function tests 10/04/2014   Fibromyalgia    GAD (generalized anxiety disorder) 10/10/2017   History of bronchitis    few yrs ago   History of cardiovascular stress test    ETT 05/09/23: normal   History of colon polyps    benign   History of migraine    last one over a month ago   Hyperlipidemia    takes Atorvastatin daily   Hypertension    takes Clonidine and HCTZ daily   Joint pain    Migraines    Muscle spasms of neck 06/06/2016   Neuromuscular disorder (HCC)     Neuropathy 04/25/2016   Nocturia    PAF (paroxysmal atrial fibrillation) (HCC) 09/17/2018   Recurrent boils 04/25/2016   Spinal stenosis of cervical region 10/25/2014   Stroke (HCC) 09/2015   Embolic Right ACA,  ILR placed Dr. 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   Dilated cardiomyopathy (HCC) 12/09/2019   Paroxysmal atrial fibrillation (HCC) 09/17/2018   GAD (generalized anxiety disorder) 10/10/2017   DDD (degenerative disc disease), lumbar 04/17/2017   Muscle spasms of neck 06/06/2016   Neuropathy 04/25/2016   Recurrent boils 04/25/2016   Hyperlipidemia 02/16/2016   Chronic migraine 02/08/2016   Essential hypertension 12/08/2015   Cryptogenic stroke (HCC) 10/02/2015   Chest mass 04/17/2015   Spinal stenosis of cervical region 10/25/2014   Elevated liver function  tests 10/04/2014   Polyarthralgia 09/20/2014   Hidradenitis suppurativa 07/21/2014   Tear of medial meniscus of right knee 10/09/2013   Carpal tunnel syndrome 07/17/2013   HEADACHE 06/27/2007    Past Surgical History:  Procedure Laterality Date   ABDOMINAL HYSTERECTOMY  1997   ANTERIOR CERVICAL DECOMP/DISCECTOMY FUSION N/A 10/25/2014   Procedure: CERVICAL THREE-FOUR, CERVICAL FOUR FIVE ANTERIOR CERVICAL DECOMPRESSION/DISCECTOMY FUSION 2 LEVEL/HARDWARE REMOVALCERVICAL FIVE-SEVEN;  Surgeon: Mariam Dollar, MD;  Location: [facility];  Service: Neurosurgery;  Laterality: N/A;   BREAST DUCTAL SYSTEM EXCISION Left 11/14/2016   Procedure:  EXCISION DUCTAL SYSTEM LEFT BREAST;  Surgeon: Chevis Pretty III, MD;  Location: [facility];  Service: General;  Laterality: Left;   BREAST EXCISIONAL BIOPSY Right    benign   BREAST EXCISIONAL BIOPSY Left    benign   CARPAL TUNNEL RELEASE Left 10/25/2014   Procedure: CARPAL TUNNEL RELEASE;  Surgeon: Mariam Dollar, MD;  Location: [facility];  Service: Neurosurgery;  Laterality: Left;   CARPAL TUNNEL RELEASE Right    CERVICAL FUSION  2007   CESAREAN SECTION  1985   CHEST SURGERY  04/2015   Benign chest mass removed- [facility]    CHOLECYSTECTOMY  1998   COLONOSCOPY     EP IMPLANTABLE DEVICE N/A 10/06/2015   Procedure: Loop Recorder Insertion;  Surgeon: Hillis Range, MD;  Location: [facility];  Service: Cardiovascular;  Laterality: N/A;   implantable loop recorder removal  08/10/2019   [employer] Reveal LINQ removed in office by Dr Johney Frame   KNEE ARTHROSCOPY Right 1999   KNEE ARTHROSCOPY WITH MEDIAL MENISECTOMY Right 10/09/2013   Procedure: RIGHT KNEE ARTHROSCOPY WITH MEDIAL MENISECTOMY;  Surgeon: Eulas Post, MD;  Location: [facility];  Service: Orthopedics;  Laterality: Right;  clean   TEE WITHOUT CARDIOVERSION N/A 10/05/2015   Procedure: TRANSESOPHAGEAL ECHOCARDIOGRAM (TEE);  Surgeon: Vesta Mixer, MD;  Location: [facility];  Service: Cardiovascular;  Laterality: N/A;   thumb surgery Right     OB History     Gravida  2   Para  1   Term  1   Preterm      AB  1   Living  1      SAB      IAB  1   Ectopic      Multiple      Live Births               Home Medications    Prior to Admission medications   Medication Sig Start Date End Date Taking? Authorizing Provider  acyclovir (ZOVIRAX) 400 MG tablet TAKE 1 TABLET TWICE A DAY 04/17/23   [name], [name], MD  Antiseborrheic Products, Misc. (PROMISEB) CREA Apply 1 application topically as needed (for skin breakouts). 06/26/21   [provider]  atorvastatin (LIPITOR) 40 MG  tablet Take 1 tablet (40 mg total) by mouth daily. 04/23/23   [name] T, PA-C  baclofen 5 MG TABS Take 2 tablets (10 mg total) by mouth 2 (two) times daily as needed for muscle spasms. 02/05/23   Rising, Lurena Joiner, PA-C  cloNIDine (CATAPRES) 0.1 MG tablet Take 1 tablet (0.1 mg total) by mouth 2 (two) times daily. 04/23/23   [name] T, PA-C  clotrimazole (LOTRIMIN) 1 % cream Apply 1 application topically 2 (two) times daily. To face 03/30/20   Salley Scarlet, MD  hydrochlorothiazide (HYDRODIURIL) 25 MG tablet Take 1 tablet (25 mg total) by mouth daily.  04/23/23   [name] T, PA-C  HYDROcodone-acetaminophen (NORCO) 10-325 MG tablet Take 1 tablet by mouth every 8 (eight) hours as needed. Chronic Pain. Dx: G89.4 05/28/23   [name], [name], MD  LORazepam (ATIVAN) 0.5 MG tablet Take 1 tablet (0.5 mg total) by mouth 2 (two) times daily as needed. for anxiety 09/17/18   Salley Scarlet, MD  losartan (COZAAR) 25 MG tablet Take 1 tablet (25 mg total) by mouth daily. 04/23/23   [name] T, PA-C  potassium chloride (KLOR-CON M10) 10 MEQ tablet Take 1 tablet (10 mEq total) by mouth daily. 04/23/23   [name] T, PA-C  rivaroxaban (XARELTO) 20 MG TABS tablet TAKE 1 TABLET DAILY WITH   SUPPER 04/23/23   [name] T, PA-C  triamcinolone cream (KENALOG) 0.1 % Apply 1 application topically 2 (two) times daily. 08/10/20   Salley Scarlet, MD    Family History Family History  Problem Relation Age of Onset   Hypertension Mother    Diabetes Mother    Cancer Maternal Grandmother        colon   Stroke Maternal Grandmother    Colon polyps Maternal Grandmother    Colon cancer Neg Hx    Pancreatic cancer Neg Hx    Esophageal cancer Neg Hx     Social History Social History   Tobacco Use   Smoking status: Former    Current packs/day: 0.00    Types: Cigarettes    Quit date: 07/07/2014    Years since quitting: 8.9   Smokeless tobacco: Never   Tobacco comments:    may smoke 2  cigarettes per month  Vaping Use   Vaping status: Never Used  Substance Use Topics   Alcohol use: Not Currently   Drug use: No     Allergies   Azithromycin, Baclofen, Other, Ofloxacin, Penicillin g, Penicillins, and Spironolactone   Review of Systems Review of Systems Per HPI  Physical Exam Triage Vital Signs ED Triage Vitals  Encounter Vitals Group     BP 06/18/23 1242 112/73     Systolic BP Percentile --      Diastolic BP Percentile --      Pulse Rate 06/18/23 1242 86     Resp 06/18/23 1242 15     Temp 06/18/23 1242 98.4 F (36.9 C)     Temp Source 06/18/23 1242 Oral     SpO2 06/18/23 1242 98 %     Weight --      Height --      Head Circumference --      Peak Flow --      Pain Score 06/18/23 1241 1     Pain Loc --      Pain Education --      Exclude from Growth Chart --    No data found.  Updated Vital Signs BP 112/73 (BP Location: Left Arm)   Pulse 86   Temp 98.4 F (36.9 C) (Oral)   Resp 15   SpO2 98%   Visual Acuity Right Eye Distance:   Left Eye Distance:   Bilateral Distance:    Right Eye Near:   Left Eye Near:    Bilateral Near:     Physical Exam Vitals and nursing note reviewed.  Constitutional:      General: She is not in acute distress.    Appearance: Normal appearance. She is not toxic-appearing.  HENT:     Mouth/Throat:     Mouth: Mucous membranes are  moist.     Pharynx: Oropharynx is clear.  Pulmonary:     Effort: Pulmonary effort is normal. No respiratory distress.  Skin:    General: Skin is warm and dry.     Capillary Refill: Capillary refill takes less than 2 seconds.     Coloration: Skin is not jaundiced or pale.     Findings: No erythema.     Comments: Pinpoint abrasion noted to second digit immediately distal to the MTP joint laterally.  No surrounding erythema, warmth, fluctuance.  Distal extremity is neurovascularly intact.  Patient has full range of motion and strength of the extremity.  Neurological:     Mental  Status: She is alert and oriented to person, place, and time.  Psychiatric:        Behavior: Behavior is cooperative.      UC Treatments / Results  Labs (all labs ordered are listed, but only abnormal results are displayed) Labs Reviewed - No data to display  EKG   Radiology No results found.  Procedures Procedures (including critical care time)  Medications Ordered in UC Medications  Tdap (BOOSTRIX) injection 0.5 mL (0.5 mLs Intramuscular Given 06/18/23 1305)    Initial Impression / Assessment and Plan / UC Course  I have reviewed the triage vital signs and the nursing notes.  Pertinent labs & imaging results that were available during my care of the patient were reviewed by me and considered in my medical decision making (see chart for details).   Patient is well-appearing, normotensive, afebrile, not tachycardic, not tachypneic, oxygenating well on room air.    1. Abrasion of finger, initial encounter 2. Need for tetanus booster Wound care discussed with patient Tetanus updated today Return precautions discussed with patient   The patient was given the opportunity to ask questions.  All questions answered to their satisfaction.  The patient is in agreement to this plan.    Final Clinical Impressions(s) / UC Diagnoses   Final diagnoses:  Abrasion of finger, initial encounter  Need for tetanus booster     Discharge Instructions      We have updated tetanus shot today.    Please keep the wound clean and dry.  Seek care if signs or symptoms of infection develop.    ED Prescriptions   None    PDMP not reviewed this encounter.   Valentino Nose, NP 06/18/23 1301    Valentino Nose, NP 06/18/23 [phone]

## 2023-06-18 NOTE — ED Triage Notes (Signed)
Pt reports cut cut finger on Sunday when cutting up raw [place]. Pt messaged her PCP who advised pt today to come get an updated Tetanus shot.

## 2023-06-20 ENCOUNTER — Other Ambulatory Visit: Payer: Self-pay | Admitting: *Deleted

## 2023-06-20 DIAGNOSIS — I480 Paroxysmal atrial fibrillation: Secondary | ICD-10-CM

## 2023-06-20 MED ORDER — RIVAROXABAN 20 MG PO TABS
ORAL_TABLET | ORAL | 1 refills | Status: DC
Start: 2023-06-20 — End: 2023-11-19

## 2023-06-20 NOTE — Telephone Encounter (Signed)
Please make sure her prescription is taken care of for the Xarelto. [name], PA-C    06/20/2023 8:16 AM

## 2023-06-26 ENCOUNTER — Encounter: Payer: Self-pay | Admitting: Internal Medicine

## 2023-06-26 ENCOUNTER — Ambulatory Visit: Payer: No Typology Code available for payment source | Admitting: Internal Medicine

## 2023-06-26 VITALS — BP 149/99 | HR 81 | Temp 97.3°F | Resp 98 | Ht 64.0 in | Wt 140.0 lb

## 2023-06-26 DIAGNOSIS — Z09 Encounter for follow-up examination after completed treatment for conditions other than malignant neoplasm: Secondary | ICD-10-CM

## 2023-06-26 DIAGNOSIS — D122 Benign neoplasm of ascending colon: Secondary | ICD-10-CM | POA: Diagnosis not present

## 2023-06-26 DIAGNOSIS — Z86010 Personal history of colonic polyps: Secondary | ICD-10-CM | POA: Diagnosis not present

## 2023-06-26 DIAGNOSIS — D125 Benign neoplasm of sigmoid colon: Secondary | ICD-10-CM

## 2023-06-26 DIAGNOSIS — K2970 Gastritis, unspecified, without bleeding: Secondary | ICD-10-CM

## 2023-06-26 DIAGNOSIS — Z7901 Long term (current) use of anticoagulants: Secondary | ICD-10-CM

## 2023-06-26 DIAGNOSIS — K2980 Duodenitis without bleeding: Secondary | ICD-10-CM | POA: Diagnosis not present

## 2023-06-26 DIAGNOSIS — K2950 Unspecified chronic gastritis without bleeding: Secondary | ICD-10-CM | POA: Diagnosis not present

## 2023-06-26 DIAGNOSIS — R1310 Dysphagia, unspecified: Secondary | ICD-10-CM

## 2023-06-26 DIAGNOSIS — D123 Benign neoplasm of transverse colon: Secondary | ICD-10-CM

## 2023-06-26 DIAGNOSIS — I10 Essential (primary) hypertension: Secondary | ICD-10-CM | POA: Diagnosis not present

## 2023-06-26 DIAGNOSIS — R1013 Epigastric pain: Secondary | ICD-10-CM

## 2023-06-26 DIAGNOSIS — D130 Benign neoplasm of esophagus: Secondary | ICD-10-CM | POA: Diagnosis not present

## 2023-06-26 DIAGNOSIS — D120 Benign neoplasm of cecum: Secondary | ICD-10-CM

## 2023-06-26 DIAGNOSIS — K2990 Gastroduodenitis, unspecified, without bleeding: Secondary | ICD-10-CM | POA: Diagnosis not present

## 2023-06-26 DIAGNOSIS — K2281 Esophageal polyp: Secondary | ICD-10-CM

## 2023-06-26 DIAGNOSIS — D175 Benign lipomatous neoplasm of intra-abdominal organs: Secondary | ICD-10-CM | POA: Diagnosis not present

## 2023-06-26 MED ORDER — OMEPRAZOLE 40 MG PO CPDR: 40.0000 mg | DELAYED_RELEASE_CAPSULE | Freq: Two times a day (BID) | ORAL | 6 refills | Status: DC

## 2023-06-26 MED ORDER — SODIUM CHLORIDE 0.9 % IV SOLN: 500.0000 mL | Freq: Once | INTRAVENOUS | Status: DC

## 2023-06-26 NOTE — Op Note (Signed)
[facility] Patient Name: Alicia Bolton Procedure Date: 06/26/2023 7:56 AM MRN: [id number] Endoscopist: Madelyn Brunner [place] , , [id number] Age: 67 Referring MD:  Date of Birth: 1955/12/28 Gender: Female Account #: [id number] Procedure:                Upper GI endoscopy Indications:              Epigastric abdominal pain, Dysphagia Medicines:                Monitored Anesthesia Care Procedure:                Pre-Anesthesia Assessment:                           - Prior to the procedure, a History and Physical                            was performed, and patient medications and                            allergies were reviewed. The patient's tolerance of                            previous anesthesia was also reviewed. The risks                            and benefits of the procedure and the sedation                            options and risks were discussed with the patient.                            All questions were answered, and informed consent                            was obtained. Prior Anticoagulants: The patient has                            taken Xarelto (rivaroxaban), last dose was 2 days                            prior to procedure. ASA Grade Assessment: III - A                            patient with severe systemic disease. After                            reviewing the risks and benefits, the patient was                            deemed in satisfactory condition to undergo the                            procedure.  After obtaining informed consent, the endoscope was                            passed under direct vision. Throughout the                            procedure, the patient's blood pressure, pulse, and                            oxygen saturations were monitored continuously. The                            Olympus Scope SN [id number] was introduced through the                            mouth, and advanced to the second  part of duodenum.                            The upper GI endoscopy was accomplished without                            difficulty. The patient tolerated the procedure                            well. Scope In: Scope Out: Findings:                 Two 5-6 mm polyps with no bleeding were found in                            the proximal esophagus. Biopsies were taken with a                            cold forceps for histology.                           Biopsies were taken with a cold forceps in the mid                            esophagus and in the distal esophagus for histology.                           Localized inflammation characterized by congestion                            (edema), erosions and erythema was found in the                            gastric antrum. Biopsies were taken with a cold                            forceps for histology.                           A  single 15 mm submucosal nodule with a localized                            distribution was found in the second portion of the                            duodenum. Biopsies were taken with a cold forceps                            for histology. Complications:            No immediate complications. Estimated Blood Loss:     Estimated blood loss was minimal. Impression:               - Esophageal polyp(s) were found. Biopsied.                           - Gastritis. Biopsied.                           - Submucosal nodule found in the duodenum. Biopsied.                           - Biopsies were taken with a cold forceps for                            histology in the mid esophagus and in the distal                            esophagus. Recommendation:           - Await pathology results.                           - Use Prilosec (omeprazole) 40 mg PO BID for 8                            weeks, then daily.                           - Return to GI clinic in 2-3 months.                           - Perform a  colonoscopy today. Dr Particia Lather "[name]" [place],  06/26/2023 9:02:20 AM

## 2023-06-26 NOTE — Progress Notes (Signed)
Sedate, gd SR, tolerated procedure well, VSS, report to RN 

## 2023-06-26 NOTE — Op Note (Signed)
[facility] Patient Name: Alicia Bolton Procedure Date: 06/26/2023 7:55 AM MRN: [id number] Endoscopist: Madelyn Brunner [place] , , [id number] Age: 67 Referring MD:  Date of Birth: [birth year] Gender: Female Account #: [id number] Procedure:                Colonoscopy Indications:              High risk colon cancer surveillance: Personal                            history of colonic polyps Medicines:                Monitored Anesthesia Care Procedure:                Pre-Anesthesia Assessment:                           - Prior to the procedure, a History and Physical                            was performed, and patient medications and                            allergies were reviewed. The patient's tolerance of                            previous anesthesia was also reviewed. The risks                            and benefits of the procedure and the sedation                            options and risks were discussed with the patient.                            All questions were answered, and informed consent                            was obtained. Prior Anticoagulants: The patient has                            taken Xarelto (rivaroxaban), last dose was 2 days                            prior to procedure. ASA Grade Assessment: III - A                            patient with severe systemic disease. After                            reviewing the risks and benefits, the patient was                            deemed in satisfactory condition to undergo the  procedure.                           After obtaining informed consent, the colonoscope                            was passed under direct vision. Throughout the                            procedure, the patient's blood pressure, pulse, and                            oxygen saturations were monitored continuously. The                            Olympus Scope [id number] was introduced through the                             anus and advanced to the the terminal ileum. The                            colonoscopy was performed without difficulty. The                            patient tolerated the procedure well. The quality                            of the bowel preparation was good. The terminal                            ileum, ileocecal valve, appendiceal orifice, and                            rectum were photographed. Scope In: 8:24:09 AM Scope Out: 8:49:29 AM Scope Withdrawal Time: 0 hours 19 minutes 18 seconds  Total Procedure Duration: 0 hours 25 minutes 20 seconds  Findings:                 The terminal ileum appeared normal.                           Eight sessile polyps were found in the transverse                            colon, ascending colon and cecum. The polyps were 3                            to 7 mm in size. These polyps were removed with a                            cold snare. Resection and retrieval were complete.                           One 20 mm submucosal nodule (suspected lipoma) was  found in the ascending colon. Biopsies were taken                            with a cold forceps for histology.                           A 16 mm polyp was found in the transverse colon.                            The polyp was sessile. The polyp was removed with a                            hot snare. Resection and retrieval were complete.                           A 20 mm polyp was found in the sigmoid colon. The                            polyp was pedunculated. The polyp was removed with                            a hot snare. Resection and retrieval were complete.                           Non-bleeding internal hemorrhoids were found during                            retroflexion. Complications:            No immediate complications. Estimated Blood Loss:     Estimated blood loss was minimal. Impression:               - The examined portion of  the ileum was normal.                           - Eight 3 to 7 mm polyps in the transverse colon,                            in the ascending colon and in the cecum, removed                            with a cold snare. Resected and retrieved.                           - Submucosal nodule in the ascending colon.                            Biopsied.                           - One 16 mm polyp in the transverse colon, removed                            with a  hot snare. Resected and retrieved.                           - One 20 mm polyp in the sigmoid colon, removed                            with a hot snare. Resected and retrieved.                           - Non-bleeding internal hemorrhoids. Recommendation:           - Discharge patient to home (with escort).                           - Await pathology results.                           - Okay to restart Xarelto tomorrow.                           - The findings and recommendations were discussed                            with the patient. Dr Particia Lather "[place]" [place],  06/26/2023 9:09:27 AM

## 2023-06-26 NOTE — Progress Notes (Signed)
Called to room to assist during endoscopic procedure.  Patient ID and intended procedure confirmed with present staff. Received instructions for my participation in the procedure from the performing physician.  

## 2023-06-26 NOTE — Progress Notes (Signed)
GASTROENTEROLOGY PROCEDURE H&P NOTE   Primary Care Physician: [name], [name], MD    Reason for Procedure:   Epigastric ab pain, dysphagia, history of colon polyps  Plan:    Colonoscopy  Patient is appropriate for endoscopic procedure(s) in the ambulatory ([facility]) setting.  The nature of the procedure, as well as the risks, benefits, and alternatives were carefully and thoroughly reviewed with the patient. Ample time for discussion and questions allowed. The patient understood, was satisfied, and agreed to proceed.     HPI: SAJDA TIER is a 67 y.o. female who presents for colonoscopy for evaluation of epigastric ab pain, dysphagia, and history of colon polyps .  Patient was most recently seen in the Gastroenterology Clinic on 05/07/23.  No interval change in medical history since that appointment. Please refer to that note for full details regarding GI history and clinical presentation.   Past Medical History:  Diagnosis Date   Anxiety    takes Ativan daily as needed   Arthritis    Carpal tunnel syndrome 07/17/2013   Chest mass 04/17/2015   Chronic migraine 02/08/2016   DDD (degenerative disc disease), lumbar 04/17/2017   Dilated cardiomyopathy (HCC) 12/09/2019   EF 45-50 by echocardiogram // Myoview 11/2019: EF 46, no ischemia, mild apical thinning artifact; Low Risk // Echo 4/23: EF 45-50, no RWMA, GR 1 DD, GLS -15.2, normal RVSF, normal PASP, RVSP 25.3, mild MR, AV sclerosis without stenosis // TTE 04/09/2023: EF 50-55, no RWMA, GR 1 DD, mildly reduced RVSF, mild LAE, mild MR, AV sclerosis, RAP 3, average GLS -16.9   DISORDER, TOBACCO USE 06/27/2007   Qualifier: Diagnosis of  By: Reche Dixon MD, David     Elevated liver function tests 10/04/2014   Fibromyalgia    GAD (generalized anxiety disorder) 10/10/2017   History of bronchitis    few yrs ago   History of cardiovascular stress test    ETT 05/09/23: normal   History of colon polyps    benign   History of  migraine    last one over a month ago   Hyperlipidemia    takes Atorvastatin daily   Hypertension    takes Clonidine and HCTZ daily   Joint pain    Migraines    Muscle spasms of neck 06/06/2016   Neuromuscular disorder (HCC)    Neuropathy 04/25/2016   Nocturia    PAF (paroxysmal atrial fibrillation) (HCC) 09/17/2018   Recurrent boils 04/25/2016   Spinal stenosis of cervical region 10/25/2014   Stroke (HCC) 09/2015   Embolic Right ACA,  ILR placed Dr. Johney Frame.States slight weakness in left   Tear of medial meniscus of right knee 10/09/2013    Past Surgical History:  Procedure Laterality Date   ABDOMINAL HYSTERECTOMY  1997   ANTERIOR CERVICAL DECOMP/DISCECTOMY FUSION N/A 10/25/2014   Procedure: CERVICAL THREE-FOUR, CERVICAL FOUR FIVE ANTERIOR CERVICAL DECOMPRESSION/DISCECTOMY FUSION 2 LEVEL/HARDWARE REMOVALCERVICAL FIVE-SEVEN;  Surgeon: Mariam Dollar, MD;  Location: [facility];  Service: Neurosurgery;  Laterality: N/A;   BREAST DUCTAL SYSTEM EXCISION Left 11/14/2016   Procedure: EXCISION DUCTAL SYSTEM LEFT BREAST;  Surgeon: Chevis Pretty III, MD;  Location: [facility] OR;  Service: General;  Laterality: Left;   BREAST EXCISIONAL BIOPSY Right    benign   BREAST EXCISIONAL BIOPSY Left    benign   CARPAL TUNNEL RELEASE Left 10/25/2014   Procedure: CARPAL TUNNEL RELEASE;  Surgeon: Mariam Dollar, MD;  Location: [facility];  Service: Neurosurgery;  Laterality: Left;   CARPAL  TUNNEL RELEASE Right    CERVICAL FUSION  2007   CESAREAN SECTION  1985   CHEST SURGERY  04/2015   Benign chest mass removed- [facility]    CHOLECYSTECTOMY  1998   COLONOSCOPY     EP IMPLANTABLE DEVICE N/A 10/06/2015   Procedure: Loop Recorder Insertion;  Surgeon: Hillis Range, MD;  Location: [facility] INVAS[facility] CV LAB;  Service: Cardiovascular;  Laterality: N/A;   implantable loop recorder removal  08/10/2019   Medtronic Reveal LINQ removed in office by Dr Johney Frame   KNEE ARTHROSCOPY Right 1999   KNEE ARTHROSCOPY WITH MEDIAL  MENISECTOMY Right 10/09/2013   Procedure: RIGHT KNEE ARTHROSCOPY WITH MEDIAL MENISECTOMY;  Surgeon: Eulas Post, MD;  Location:  SURGERY CENTER;  Service: Orthopedics;  Laterality: Right;  clean   TEE WITHOUT CARDIOVERSION N/A 10/05/2015   Procedure: TRANSESOPHAGEAL ECHOCARDIOGRAM (TEE);  Surgeon: Vesta Mixer, MD;  Location: [facility] ENDOSCOPY;  Service: Cardiovascular;  Laterality: N/A;   thumb surgery Right     Prior to Admission medications   Medication Sig Start Date End Date Taking? Authorizing Provider  acyclovir (ZOVIRAX) 400 MG tablet TAKE 1 TABLET TWICE A DAY 04/17/23  Yes [name], [name], MD  atorvastatin (LIPITOR) 40 MG tablet Take 1 tablet (40 mg total) by mouth daily. 04/23/23  Yes Weaver, Scott T, PA-C  cloNIDine (CATAPRES) 0.1 MG tablet Take 1 tablet (0.1 mg total) by mouth 2 (two) times daily. 04/23/23  Yes Weaver, Scott T, PA-C  hydrochlorothiazide (HYDRODIURIL) 25 MG tablet Take 1 tablet (25 mg total) by mouth daily. 04/23/23  Yes Weaver, Scott T, PA-C  losartan (COZAAR) 25 MG tablet Take 1 tablet (25 mg total) by mouth daily. 04/23/23  Yes Weaver, Scott T, PA-C  potassium chloride (KLOR-CON M10) 10 MEQ tablet Take 1 tablet (10 mEq total) by mouth daily. 04/23/23  Yes [name] T, PA-C  Antiseborrheic Products, Misc. (PROMISEB) CREA Apply 1 application topically as needed (for skin breakouts). 06/26/21   [provider]  baclofen 5 MG TABS Take 2 tablets (10 mg total) by mouth 2 (two) times daily as needed for muscle spasms. 02/05/23   Rising, Lurena Joiner, PA-C  clotrimazole (LOTRIMIN) 1 % cream Apply 1 application topically 2 (two) times daily. To face 03/30/20   Salley Scarlet, MD  HYDROcodone-acetaminophen [facility]) 10-325 MG tablet Take 1 tablet by mouth every 8 (eight) hours as needed. Chronic Pain. Dx: G89.4 05/28/23   [name], [name], MD  LORazepam (ATIVAN) 0.5 MG tablet Take 1 tablet (0.5 mg total) by mouth 2 (two) times daily as needed. for  anxiety 09/17/18   Salley Scarlet, MD  rivaroxaban (XARELTO) 20 MG TABS tablet TAKE 1 TABLET DAILY WITH   SUPPER 06/20/23   [name] T, PA-C  triamcinolone cream (KENALOG) 0.1 % Apply 1 application topically 2 (two) times daily. Patient not taking: Reported on 06/26/2023 08/10/20   Salley Scarlet, MD    Current Outpatient Medications  Medication Sig Dispense Refill   acyclovir (ZOVIRAX) 400 MG tablet TAKE 1 TABLET TWICE A DAY 180 tablet 1   atorvastatin (LIPITOR) 40 MG tablet Take 1 tablet (40 mg total) by mouth daily. 90 tablet 3   cloNIDine (CATAPRES) 0.1 MG tablet Take 1 tablet (0.1 mg total) by mouth 2 (two) times daily. 180 tablet 3   hydrochlorothiazide (HYDRODIURIL) 25 MG tablet Take 1 tablet (25 mg total) by mouth daily. 90 tablet 3   losartan (COZAAR) 25 MG tablet Take 1 tablet (  25 mg total) by mouth daily. 90 tablet 3   potassium chloride (KLOR-CON M10) 10 MEQ tablet Take 1 tablet (10 mEq total) by mouth daily. 90 tablet 3   Antiseborrheic Products, Misc. (PROMISEB) CREA Apply 1 application topically as needed (for skin breakouts).     baclofen 5 MG TABS Take 2 tablets (10 mg total) by mouth 2 (two) times daily as needed for muscle spasms. 15 tablet 0   clotrimazole (LOTRIMIN) 1 % cream Apply 1 application topically 2 (two) times daily. To face 30 g 1   HYDROcodone-acetaminophen (NORCO) 10-325 MG tablet Take 1 tablet by mouth every 8 (eight) hours as needed. Chronic Pain. Dx: G89.4 21 tablet 0   LORazepam (ATIVAN) 0.5 MG tablet Take 1 tablet (0.5 mg total) by mouth 2 (two) times daily as needed. for anxiety 30 tablet 1   rivaroxaban (XARELTO) 20 MG TABS tablet TAKE 1 TABLET DAILY WITH   SUPPER 90 tablet 1   triamcinolone cream (KENALOG) 0.1 % Apply 1 application topically 2 (two) times daily. (Patient not taking: Reported on 06/26/2023) 30 g 1   Current Facility-Administered Medications  Medication Dose Route Frequency Provider Last Rate Last Admin   0.9 %  sodium chloride  infusion  500 mL Intravenous Once Imogene Burn, MD        Allergies as of 06/26/2023 - Review Complete 06/26/2023  Allergen Reaction Noted   Azithromycin Nausea And Vomiting 01/31/2018   Baclofen Nausea And Vomiting 05/07/2023   Other  06/05/2022   Ofloxacin Nausea And Vomiting and Rash 02/18/2012   Penicillin g Rash 04/26/2015   Penicillins Rash 06/27/2007   Spironolactone  05/04/2021    Family History  Problem Relation Age of Onset   Hypertension Mother    Diabetes Mother    Colon cancer Maternal Grandmother    Cancer Maternal Grandmother        colon   Stroke Maternal Grandmother    Colon polyps Maternal Grandmother    Pancreatic cancer Neg Hx    Esophageal cancer Neg Hx    Stomach cancer Neg Hx     Social History   Socioeconomic History   Marital status: Single    Spouse name: Not on file   Number of children: Not on file   Years of education: Not on file   Highest education level: Not on file  Occupational History   Not on file  Tobacco Use   Smoking status: Former    Current packs/day: 0.00    Types: Cigarettes    Quit date: 07/07/2014    Years since quitting: 8.9   Smokeless tobacco: Never   Tobacco comments:    may smoke 2 cigarettes per month  Vaping Use   Vaping status: Never Used  Substance and Sexual Activity   Alcohol use: Not Currently   Drug use: No   Sexual activity: Not on file  Other Topics Concern   Not on file  Social History Narrative   Not on file   Social Determinants of Health   Financial Resource Strain: Low Risk  (03/12/2023)   Overall Financial Resource Strain (CARDIA)    Difficulty of Paying Living Expenses: Not hard at all  Food Insecurity: No Food Insecurity (03/12/2023)   Hunger Vital Sign    Worried About Running Out of Food in the Last Year: Never true    Ran Out of Food in the Last Year: Never true  Transportation Needs: Unmet Transportation Needs (03/12/2023)   PRAPARE - Transportation  Lack of Transportation  (Medical): Yes    Lack of Transportation (Non-Medical): Yes  Physical Activity: Sufficiently Active (03/12/2023)   Exercise Vital Sign    Days of Exercise per Week: 7 days    Minutes of Exercise per Session: 40 min  Stress: No Stress Concern Present (03/12/2023)   [employer] of Occupational Health - Occupational Stress Questionnaire    Feeling of Stress : Not at all  Social Connections: Socially Isolated (03/12/2023)   Social Connection and Isolation Panel [NHANES]    Frequency of Communication with Friends and Family: More than three times a week    Frequency of Social Gatherings with Friends and Family: More than three times a week    Attends Religious Services: Never    Database administrator or Organizations: No    Attends Banker Meetings: Never    Marital Status: Divorced  Catering manager Violence: Not At Risk (03/12/2023)   Humiliation, Afraid, Rape, and Kick questionnaire    Fear of Current or Ex-Partner: No    Emotionally Abused: No    Physically Abused: No    Sexually Abused: No    Physical Exam: Vital signs in last 24 hours: BP 112/68   Pulse 77   Temp (!) 97.3 F (36.3 C)   Ht 5\' 4"  (1.626 m)   Wt 140 lb (63.5 kg)   SpO2 96%   BMI 24.03 kg/m  GEN: NAD EYE: Sclerae anicteric ENT: MMM CV: Non-tachycardic Pulm: No increased WOB GI: Soft NEURO:  Alert & Oriented   Eulah Pont, MD [facility]   06/26/2023 7:32 AM

## 2023-06-26 NOTE — Patient Instructions (Signed)
Please read handouts provided. Await pathology results. Restart Xarelto tomorrow. Prilosec 40 mg twice daily for 8 weeks, then daily. Return to GI clinic in 2-3 months.   YOU HAD AN ENDOSCOPIC PROCEDURE TODAY AT THE [facility]:   Refer to the procedure report that was given to you for any specific questions about what was found during the examination.  If the procedure report does not answer your questions, please call your gastroenterologist to clarify.  If you requested that your care partner not be given the details of your procedure findings, then the procedure report has been included in a sealed envelope for you to review at your convenience later.  YOU SHOULD EXPECT: Some feelings of bloating in the abdomen. Passage of more gas than usual.  Walking can help get rid of the air that was put into your GI tract during the procedure and reduce the bloating. If you had a lower endoscopy (such as a colonoscopy or flexible sigmoidoscopy) you may notice spotting of blood in your stool or on the toilet paper. If you underwent a bowel prep for your procedure, you may not have a normal bowel movement for a few days.  Please Note:  You might notice some irritation and congestion in your nose or some drainage.  This is from the oxygen used during your procedure.  There is no need for concern and it should clear up in a day or so.  SYMPTOMS TO REPORT IMMEDIATELY:  Following lower endoscopy (colonoscopy or flexible sigmoidoscopy):  Excessive amounts of blood in the stool  Significant tenderness or worsening of abdominal pains  Swelling of the abdomen that is new, acute  Fever of 100F or higher  Following upper endoscopy (EGD)  Vomiting of blood or coffee ground material  New chest pain or pain under the shoulder blades  Painful or persistently difficult swallowing  New shortness of breath  Fever of 100F or higher  Black, tarry-looking stools  For urgent or emergent issues, a  gastroenterologist can be reached at any hour by calling [phone]. Do not use MyChart messaging for urgent concerns.    DIET:  We do recommend a small meal at first, but then you may proceed to your regular diet.  Drink plenty of fluids but you should avoid alcoholic beverages for 24 hours.  ACTIVITY:  You should plan to take it easy for the rest of today and you should NOT DRIVE or use heavy machinery until tomorrow (because of the sedation medicines used during the test).    FOLLOW UP: Our staff will call the number listed on your records the next business day following your procedure.  We will call around 7:15- 8:00 am to check on you and address any questions or concerns that you may have regarding the information given to you following your procedure. If we do not reach you, we will leave a message.     If any biopsies were taken you will be contacted by phone or by letter within the next 1-3 weeks.  Please call us at [phone] if you have not heard about the biopsies in 3 weeks.    SIGNATURES/CONFIDENTIALITY: You and/or your care partner have signed paperwork which will be entered into your electronic medical record.  These signatures attest to the fact that that the information above on your After Visit Summary has been reviewed and is understood.  Full responsibility of the confidentiality of this discharge information lies with you and/or your care-partner.

## 2023-06-27 ENCOUNTER — Telehealth: Payer: Self-pay | Admitting: *Deleted

## 2023-06-27 ENCOUNTER — Encounter: Payer: Self-pay | Admitting: Internal Medicine

## 2023-06-27 NOTE — Telephone Encounter (Signed)
  Follow up Call-     06/26/2023    7:18 AM  Call back number  Post procedure Call Back phone  # [phone]  Permission to leave phone message Yes     Patient questions:  Do you have a fever, pain , or abdominal swelling? No. Pain Score  0 *  Have you tolerated food without any problems? Yes.    Have you been able to return to your normal activities? Yes.    Do you have any questions about your discharge instructions: Diet   No. Medications  No. Follow up visit  No.  Do you have questions or concerns about your Care? Yes.    Actions: * If pain score is 4 or above: No action needed, pain <4.

## 2023-06-28 ENCOUNTER — Other Ambulatory Visit: Payer: Self-pay

## 2023-06-28 DIAGNOSIS — K319 Disease of stomach and duodenum, unspecified: Secondary | ICD-10-CM

## 2023-06-28 NOTE — Progress Notes (Signed)
Called the patient to let her know that her biopsies showed inflammation in the small bowel and stomach. Esophageal polyps were benign but precancerous. I believe that I was able to remove the polyps when I took biopsies of the polyps. I do suspect that the biopsies of the duodenal nodule were not deep enough to sample the nodule well. Thus I discussed this with my colleague Dr. Meridee Score and he agrees with offering an endoscopic ultrasound to the patient to better characterize the duodenal nodule. Patient is agreeable to the endoscopic ultrasound procedure.

## 2023-06-30 ENCOUNTER — Encounter: Payer: Self-pay | Admitting: Internal Medicine

## 2023-07-15 ENCOUNTER — Encounter: Payer: Self-pay | Admitting: Internal Medicine

## 2023-07-15 ENCOUNTER — Encounter: Payer: Self-pay | Admitting: Family Medicine

## 2023-07-15 ENCOUNTER — Telehealth (INDEPENDENT_AMBULATORY_CARE_PROVIDER_SITE_OTHER): Payer: No Typology Code available for payment source | Admitting: Family Medicine

## 2023-07-15 DIAGNOSIS — U071 COVID-19: Secondary | ICD-10-CM

## 2023-07-15 MED ORDER — NIRMATRELVIR/RITONAVIR (PAXLOVID)TABLET
3.0000 | ORAL_TABLET | Freq: Two times a day (BID) | ORAL | 0 refills | Status: AC
Start: 2023-07-15 — End: 2023-07-20

## 2023-07-15 NOTE — Progress Notes (Signed)
Established Patient Office Visit   Subjective:  Patient ID: Alicia Bolton, female    DOB: 21-Jan-1956  Age: 67 y.o. MRN: [id number]  Chief Complaint  Patient presents with   Covid Positive    Sore throat and fever x 1 day.     HPI Encounter Diagnoses  Name Primary?   COVID-19 Yes   Presents with a 1 day history of scratchy throat, fever chills, cough, fatigue and malaise.  She tested positive for COVID with her home test kit.  She denies myalgias or arthralgias.  She denies wheezing or difficulty breathing.  She is felt weak   Review of Systems  Constitutional:  Positive for chills, fever and malaise/fatigue.  HENT:  Positive for congestion and sore throat.   Eyes:  Negative for blurred vision, discharge and redness.  Respiratory:  Positive for cough. Negative for shortness of breath and wheezing.   Cardiovascular: Negative.   Gastrointestinal:  Negative for abdominal pain.  Genitourinary: Negative.   Musculoskeletal: Negative.  Negative for joint pain and myalgias.  Skin:  Negative for rash.  Neurological:  Positive for weakness. Negative for tingling and loss of consciousness.  Endo/Heme/Allergies:  Negative for polydipsia.     Current Outpatient Medications:    acyclovir (ZOVIRAX) 400 MG tablet, TAKE 1 TABLET TWICE A DAY, Disp: 180 tablet, Rfl: 1   Antiseborrheic Products, Misc. (PROMISEB) CREA, Apply 1 application topically as needed (for skin breakouts)., Disp: , Rfl:    atorvastatin (LIPITOR) 40 MG tablet, Take 1 tablet (40 mg total) by mouth daily., Disp: 90 tablet, Rfl: 3   baclofen 5 MG TABS, Take 2 tablets (10 mg total) by mouth 2 (two) times daily as needed for muscle spasms., Disp: 15 tablet, Rfl: 0   cloNIDine (CATAPRES) 0.1 MG tablet, Take 1 tablet (0.1 mg total) by mouth 2 (two) times daily., Disp: 180 tablet, Rfl: 3   clotrimazole (LOTRIMIN) 1 % cream, Apply 1 application topically 2 (two) times daily. To face, Disp: 30 g, Rfl: 1   hydrochlorothiazide  (HYDRODIURIL) 25 MG tablet, Take 1 tablet (25 mg total) by mouth daily., Disp: 90 tablet, Rfl: 3   HYDROcodone-acetaminophen (NORCO) 10-325 MG tablet, Take 1 tablet by mouth every 8 (eight) hours as needed. Chronic Pain. Dx: G89.4, Disp: 21 tablet, Rfl: 0   LORazepam (ATIVAN) 0.5 MG tablet, Take 1 tablet (0.5 mg total) by mouth 2 (two) times daily as needed. for anxiety, Disp: 30 tablet, Rfl: 1   losartan (COZAAR) 25 MG tablet, Take 1 tablet (25 mg total) by mouth daily., Disp: 90 tablet, Rfl: 3   nirmatrelvir/ritonavir (PAXLOVID) 20 x 150 MG & 10 x 100MG  TABS, Take 3 tablets by mouth 2 (two) times daily for 5 days. (Take nirmatrelvir 150 mg two tablets twice daily for 5 days and ritonavir 100 mg one tablet twice daily for 5 days) Patient GFR is 63, Disp: 30 tablet, Rfl: 0   omeprazole (PRILOSEC) 40 MG capsule, Take 1 capsule (40 mg total) by mouth 2 (two) times daily. Prilosec 40 mg twice daily for 8 weeks, then daily., Disp: 90 capsule, Rfl: 6   potassium chloride (KLOR-CON M10) 10 MEQ tablet, Take 1 tablet (10 mEq total) by mouth daily., Disp: 90 tablet, Rfl: 3   rivaroxaban (XARELTO) 20 MG TABS tablet, TAKE 1 TABLET DAILY WITH   SUPPER, Disp: 90 tablet, Rfl: 1   triamcinolone cream (KENALOG) 0.1 %, Apply 1 application topically 2 (two) times daily., Disp: 30 g, Rfl: 1  Objective:     There were no vitals taken for this visit.   Physical Exam Constitutional:      General: She is not in acute distress.    Appearance: Normal appearance. She is not ill-appearing, toxic-appearing or diaphoretic.  HENT:     Head: Normocephalic and atraumatic.     Right Ear: External ear normal.     Left Ear: External ear normal.  Eyes:     Extraocular Movements: Extraocular movements intact.  Pulmonary:     Effort: Pulmonary effort is normal. No respiratory distress.  Skin:    General: Skin is warm and dry.  Neurological:     Mental Status: She is alert and oriented to person, place, and time.   Psychiatric:        Mood and Affect: Mood normal.        Behavior: Behavior normal.      No results found for any visits on 07/15/23.    The ASCVD Risk score (Arnett DK, et al., 2019) failed to calculate for the following reasons:   The patient has a prior MI or stroke diagnosis    Assessment & Plan:   COVID-19 -     nirmatrelvir/ritonavir; Take 3 tablets by mouth 2 (two) times daily for 5 days. (Take nirmatrelvir 150 mg two tablets twice daily for 5 days and ritonavir 100 mg one tablet twice daily for 5 days) Patient GFR is 63  Dispense: 30 tablet; Refill: 0    Return if symptoms worsen or fail to improve.  Advised to hold Xarelto and rosuvastatin.  Quarantine for 5 days.  Mliss Sax, MD  Virtual Visit via Video Note  I connected with Alicia Bolton on 07/15/23 at  4:00 PM EDT by a video enabled telemedicine application and verified that I am speaking with the correct person using two identifiers.  Location: Patient: at home alone in a room. Her Sister is in the house. Provider: work   I discussed the limitations of evaluation and management by telemedicine and the availability of in person appointments. The patient expressed understanding and agreed to proceed.  History of Present Illness:    Observations/Objective:   Assessment and Plan:   Follow Up Instructions:    I discussed the assessment and treatment plan with the patient. The patient was provided an opportunity to ask questions and all were answered. The patient agreed with the plan and demonstrated an understanding of the instructions.   The patient was advised to call back or seek an in-person evaluation if the symptoms worsen or if the condition fails to improve as anticipated.  I provided 25 minutes of non-face-to-face time during this encounter.   Mliss Sax, MD

## 2023-07-18 ENCOUNTER — Encounter: Payer: Self-pay | Admitting: Internal Medicine

## 2023-07-29 ENCOUNTER — Encounter: Payer: Self-pay | Admitting: Internal Medicine

## 2023-08-26 ENCOUNTER — Ambulatory Visit (INDEPENDENT_AMBULATORY_CARE_PROVIDER_SITE_OTHER): Payer: No Typology Code available for payment source | Admitting: Internal Medicine

## 2023-08-26 VITALS — BP 118/70 | HR 100 | Ht 64.0 in | Wt 124.0 lb

## 2023-08-26 DIAGNOSIS — K3189 Other diseases of stomach and duodenum: Secondary | ICD-10-CM

## 2023-08-26 DIAGNOSIS — Z860100 Personal history of colon polyps, unspecified: Secondary | ICD-10-CM

## 2023-08-26 DIAGNOSIS — R1013 Epigastric pain: Secondary | ICD-10-CM

## 2023-08-26 MED ORDER — OMEPRAZOLE 40 MG PO CPDR: 40.0000 mg | DELAYED_RELEASE_CAPSULE | Freq: Every day | ORAL | 5 refills | Status: DC

## 2023-08-26 NOTE — Patient Instructions (Addendum)
We have sent the following medications to your pharmacy for you to pick up at your convenience: Omeprazole take 1 capsule by mouth daily  If your blood pressure at your visit was 140/90 or greater, please contact your primary care physician to follow up on this.  _______________________________________________________  If you are age 66 or older, your body mass index should be between 23-30. Your Body mass index is 21.28 kg/m. If this is out of the aforementioned range listed, please consider follow up with your Primary Care Provider.  If you are age 28 or younger, your body mass index should be between 19-25. Your Body mass index is 21.28 kg/m. If this is out of the aformentioned range listed, please consider follow up with your Primary Care Provider.   ________________________________________________________  The [facility] providers would like to encourage you to use [facility] to communicate with providers for non-urgent requests or questions.  Due to long hold times on the telephone, sending your provider a message by [facility] may be a faster and more efficient way to get a response.  Please allow 48 business hours for a response.  Please remember that this is for non-urgent requests.  _______________________________________________________   Thank you for entrusting me with your care and for choosing [facility], Dr. Eulah Pont

## 2023-08-26 NOTE — Progress Notes (Signed)
08/26/2023 Alicia Bolton [id number] 21-Jun-1956   HISTORY OF PRESENT ILLNESS: This is a 67 year old female with history of A-fib on Xarelto, arthritis, colon polyps, anxiety, CVA, and migraine presents for follow-up of dysphagia and epigastric abdominal pain.  Interval History: She has lost 16 lbs since her EGD/colonoscopy but she got COVID about a week after her endoscopy procedures so it is possible that the COVID infection led to her weight loss. She hasn't been eating well due to poor appetite. Denies N&V. Denies chest burning or regurgitation.  Her former epigastric abdominal pain has now resolved. She is not having any ab pain at all.  She completed 8 weeks of omeprazole and is currently on omeprazole every day. Dysphagia has improved as well. She is still having normal regular BMs.  Wt Readings from Last 3 Encounters:  08/26/23 124 lb (56.2 kg)  06/26/23 140 lb (63.5 kg)  05/07/23 140 lb 8 oz (63.7 kg)   Past Medical History:  Diagnosis Date   Anxiety    takes Ativan daily as needed   Arthritis    Carpal tunnel syndrome 07/17/2013   Chest mass 04/17/2015   Chronic migraine 02/08/2016   DDD (degenerative disc disease), lumbar 04/17/2017   Dilated cardiomyopathy (HCC) 12/09/2019   EF 45-50 by echocardiogram // Myoview 11/2019: EF 46, no ischemia, mild apical thinning artifact; Low Risk // Echo 4/23: EF 45-50, no RWMA, GR 1 DD, GLS -15.2, normal RVSF, normal PASP, RVSP 25.3, mild MR, AV sclerosis without stenosis // TTE 04/09/2023: EF 50-55, no RWMA, GR 1 DD, mildly reduced RVSF, mild LAE, mild MR, AV sclerosis, RAP 3, average GLS -16.9   DISORDER, TOBACCO USE 06/27/2007   Qualifier: Diagnosis of  By: Reche Dixon MD, David     Elevated liver function tests 10/04/2014   Fibromyalgia    GAD (generalized anxiety disorder) 10/10/2017   History of bronchitis    few yrs ago   History of cardiovascular stress test    ETT 05/09/23: normal   History of colon polyps    benign   History  of migraine    last one over a month ago   Hyperlipidemia    takes Atorvastatin daily   Hypertension    takes Clonidine and HCTZ daily   Joint pain    Migraines    Muscle spasms of neck 06/06/2016   Neuromuscular disorder (HCC)    Neuropathy 04/25/2016   Nocturia    PAF (paroxysmal atrial fibrillation) (HCC) 09/17/2018   Recurrent boils 04/25/2016   Spinal stenosis of cervical region 10/25/2014   Stroke (HCC) 09/2015   Embolic Right ACA,  ILR placed Dr. Johney Frame.States slight weakness in left   Tear of medial meniscus of right knee 10/09/2013   Past Surgical History:  Procedure Laterality Date   ABDOMINAL HYSTERECTOMY  1997   ANTERIOR CERVICAL DECOMP/DISCECTOMY FUSION N/A 10/25/2014   Procedure: CERVICAL THREE-FOUR, CERVICAL FOUR FIVE ANTERIOR CERVICAL DECOMPRESSION/DISCECTOMY FUSION 2 LEVEL/HARDWARE REMOVALCERVICAL FIVE-SEVEN;  Surgeon: Mariam Dollar, MD;  Location: [facility] NEURO ORS;  Service: Neurosurgery;  Laterality: N/A;   BREAST DUCTAL SYSTEM EXCISION Left 11/14/2016   Procedure: EXCISION DUCTAL SYSTEM LEFT BREAST;  Surgeon: Chevis Pretty III, MD;  Location: [facility] OR;  Service: General;  Laterality: Left;   BREAST EXCISIONAL BIOPSY Right    benign   BREAST EXCISIONAL BIOPSY Left    benign   CARPAL TUNNEL RELEASE Left 10/25/2014   Procedure: CARPAL TUNNEL RELEASE;  Surgeon: Mariam Dollar, MD;  Location: [facility] NEURO  ORS;  Service: Neurosurgery;  Laterality: Left;   CARPAL TUNNEL RELEASE Right    CERVICAL FUSION  2007   CESAREAN SECTION  1985   CHEST SURGERY  04/2015   Benign chest mass removed- [facility]    CHOLECYSTECTOMY  1998   COLONOSCOPY     EP IMPLANTABLE DEVICE N/A 10/06/2015   Procedure: Loop Recorder Insertion;  Surgeon: Hillis Range, MD;  Location: [facility] INVASIVE CV LAB;  Service: Cardiovascular;  Laterality: N/A;   implantable loop recorder removal  08/10/2019   [employer] Reveal LINQ removed in office by Dr Johney Frame   KNEE ARTHROSCOPY Right 1999   KNEE ARTHROSCOPY WITH MEDIAL  MENISECTOMY Right 10/09/2013   Procedure: RIGHT KNEE ARTHROSCOPY WITH MEDIAL MENISECTOMY;  Surgeon: Eulas Post, MD;  Location: [facility];  Service: Orthopedics;  Laterality: Right;  clean   TEE WITHOUT CARDIOVERSION N/A 10/05/2015   Procedure: TRANSESOPHAGEAL ECHOCARDIOGRAM (TEE);  Surgeon: Vesta Mixer, MD;  Location: [facility] ENDOSCOPY;  Service: Cardiovascular;  Laterality: N/A;   thumb surgery Right     reports that she quit smoking about 9 years ago. Her smoking use included cigarettes. She has never used smokeless tobacco. She reports that she does not currently use alcohol. She reports that she does not use drugs. family history includes Cancer in her maternal grandmother; Colon cancer in her maternal grandmother; Colon polyps in her maternal grandmother; Diabetes in her mother; Hypertension in her mother; Stroke in her maternal grandmother. Allergies  Allergen Reactions   Azithromycin Nausea And Vomiting   Baclofen Nausea And Vomiting   Other     "Not supposed to take Tylenol or ASA"   Ofloxacin Nausea And Vomiting and Rash    Makes sick to stomach and vomit  Other Reaction(s): GI Intolerance   Penicillin G Rash    Other reaction(s): Unknown  Other Reaction(s): GI Intolerance   Penicillins Rash    Has patient had a PCN reaction causing immediate rash, facial/tongue/throat swelling, SOB or lightheadedness with hypotension: No Has patient had a PCN reaction causing severe rash involving mucus membranes or skin necrosis: No Has patient had a PCN reaction that required hospitalization No Has patient had a PCN reaction occurring within the last 10 years: No If all of the above answers are "NO", then may proceed with Cephalosporin use.    Spironolactone     BP increased; Headaches      Outpatient Encounter Medications as of 08/26/2023  Medication Sig   acyclovir (ZOVIRAX) 400 MG tablet TAKE 1 TABLET TWICE A DAY   Antiseborrheic Products, Misc. (PROMISEB) CREA  Apply 1 application topically as needed (for skin breakouts).   atorvastatin (LIPITOR) 40 MG tablet Take 1 tablet (40 mg total) by mouth daily.   baclofen 5 MG TABS Take 2 tablets (10 mg total) by mouth 2 (two) times daily as needed for muscle spasms.   cloNIDine (CATAPRES) 0.1 MG tablet Take 1 tablet (0.1 mg total) by mouth 2 (two) times daily.   clotrimazole (LOTRIMIN) 1 % cream Apply 1 application topically 2 (two) times daily. To face   hydrochlorothiazide (HYDRODIURIL) 25 MG tablet Take 1 tablet (25 mg total) by mouth daily.   HYDROcodone-acetaminophen (NORCO) 10-325 MG tablet Take 1 tablet by mouth every 8 (eight) hours as needed. Chronic Pain. Dx: G89.4   LORazepam (ATIVAN) 0.5 MG tablet Take 1 tablet (0.5 mg total) by mouth 2 (two) times daily as needed. for anxiety   losartan (COZAAR) 25 MG tablet Take 1 tablet (25  mg total) by mouth daily.   omeprazole (PRILOSEC) 40 MG capsule Take 1 capsule (40 mg total) by mouth 2 (two) times daily. Prilosec 40 mg twice daily for 8 weeks, then daily.   potassium chloride (KLOR-CON M10) 10 MEQ tablet Take 1 tablet (10 mEq total) by mouth daily.   rivaroxaban (XARELTO) 20 MG TABS tablet TAKE 1 TABLET DAILY WITH   SUPPER   triamcinolone cream (KENALOG) 0.1 % Apply 1 application topically 2 (two) times daily.   No facility-administered encounter medications on file as of 08/26/2023.    PHYSICAL EXAM: BP 118/70   Pulse 100   Ht 5\' 4"  (1.626 m)   Wt 124 lb (56.2 kg)   BMI 21.28 kg/m  General: Well developed female in no acute distress Head: Normocephalic and atraumatic Eyes:  Sclerae anicteric, conjunctiva pink. Ears: Normal auditory acuity Lungs: Clear throughout to auscultation; no W/R/R. Heart: Regular rate and rhythm; no M/R/G. Abdomen: Soft, non-distended.  BS present.  Non-tender Musculoskeletal: Symmetrical with no gross deformities  Skin: No lesions on visible extremities Extremities: No edema  Neurological: Alert oriented x 4, grossly  non-focal Psychological:  Alert and cooperative. Normal mood and affect  Labs 02/2023: CBC nml. CMP unremarkable. TSH nml.  EGD 06/26/23: - Esophageal polyp( s) were found. Biopsied. - Gastritis. Biopsied. - Submucosal nodule found in the duodenum. Biopsied. - Biopsies were taken with a cold forceps for histology in the mid esophagus and in the distal esophagus. Path: 1. Duodenum, Biopsy DUODENAL MUCOSA WITH MILD CHANGES OF PEPTIC DUODENITIS. NEGATIVE FOR FEATURES OF SPRUE OR GRANULOMAS. NEGATIVE FOR DYSPLASIA. 2. Stomach, biopsy, antrum and body ANTRAL AND OXYNTIC MUCOSA WITH SLIGHT CHRONIC INFLAMMATION. NEGATIVE FOR HELICOBACTER PYLORI. 3. Esophagus, biopsy, polyp (multiple) FINDINGS CONSISTENT WITH SQUAMOUS PAPILLOMA. NEGATIVE FOR DYSPLASIA OR MALIGNANCY. 4. Esophagus, biopsy, random sites UNREMARKABLE SQUAMOUS MUCOSA. NEGATIVE FOR EOSINOPHILIC ESOPHAGITIS.  Colonoscopy 06/26/23: - The examined portion of the ileum was normal. - Eight 3 to 7 mm polyps in the transverse colon, in the ascending colon and in the cecum, removed with a cold snare. Resected and retrieved. - Submucosal nodule in the ascending colon. Biopsied. - One 16 mm polyp in the transverse colon, removed with a hot snare. Resected and retrieved. - One 20 mm polyp in the sigmoid colon, removed with a hot snare. Resected and retrieved. - Non- bleeding internal hemorrhoids. Path: 5. Sigmoid Colon Polyp, (1) TUBULAR ADENOMA WITHOUT HIGH GRADE DYSPLASIA. STALK MARGIN NEGATIVE FOR ADENOMA 6. Colon, polyp(s), transverse - 3, and ascending - 2, and cecal - 2 (7) TUBULAR ADENOMA (S) WITHOUT HIGH GRADE DYSPLASIA. 7. Ascending Colon Biopsy, nodule COLONIC MUCOSA AND ADIPOSE TISSUE CONSISTENT WITH SUBMUCOSAL LIPOMA. 8. Transverse Colon Polyp, (1) TUBULAR ADENOMA WITH FOCI OF HIGH-GRADE GLANDULAR DYSPLASIA. NEGATIVE FOR INVASIVE CARCINOMA.  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  Patient did have several questions about her prior procedures that I answered today.  An EUS has been scheduled for further evaluation of her duodenal nodule since it is suspected that forceps biopsies taken during EGD were not deep enough to adequately sample the duodenal nodule.  Patient is symptomatically doing better with improvement in her prior epigastric abdominal pain and dysphagia.  Suspect that  GERD or gastritis may have played a role in her symptoms and that this improved with PPI therapy. - Continue omeprazole 40 mg every day. Refill. Could potentially decrease the dosage of this in the future - Scheduled for upcoming EUS for duodenal nodule - Next colonoscopy is due in 05/2024 for polyp surveillance - RTC PRN depending on the results of EUS  I spent 40 minutes of time, including in depth chart review, independent review of results as outlined above, communicating results with the patient directly, face-to-face time with the patient, coordinating care, ordering studies and medications as appropriate, and documentation.

## 2023-08-27 ENCOUNTER — Encounter (HOSPITAL_COMMUNITY): Payer: Self-pay | Admitting: Gastroenterology

## 2023-08-27 ENCOUNTER — Encounter: Payer: Self-pay | Admitting: Internal Medicine

## 2023-09-03 ENCOUNTER — Encounter (HOSPITAL_COMMUNITY): Payer: Self-pay | Admitting: Gastroenterology

## 2023-09-03 ENCOUNTER — Other Ambulatory Visit: Payer: Self-pay

## 2023-09-03 NOTE — Progress Notes (Addendum)
PCP - Atalissa Nation , MD Cardiologist - [name] , PA  PPM/ICD -  Device Orders -  Rep Notified -   Chest x-ray -  EKG -  Stress Test -  ECHO -  Cardiac Cath -   Sleep Study -  CPAP -   Fasting Blood Sugar -  Checks Blood Sugar _____ times a day  Blood Thinner Instructions: Xarelto stop 2 days prior Last dose 09-03-23 Aspirin Instructions:  ERAS Protcol - PRE-SURGERY Ensure or G2-    COVID vaccine -yes  Activity--Able to climb a flight of stairs with no CP or SOB Anesthesia review:   Patient denies shortness of breath, fever, cough and chest pain at PAT appointment   All instructions explained to the patient, with a verbal understanding of the material. Patient agrees to go over the instructions while at home for a better understanding. Patient also instructed to self quarantine after being tested for COVID-19. The opportunity to ask questions was provided.

## 2023-09-03 NOTE — Progress Notes (Addendum)
Second Attempt to obtain medical history. Unable to reach pt. At this time. HIPAA complaint voicemail left with pre-surgical testing number.

## 2023-09-05 ENCOUNTER — Ambulatory Visit (HOSPITAL_COMMUNITY)
Admission: RE | Admit: 2023-09-05 | Discharge: 2023-09-05 | Disposition: A | Discharge status: Home / Self Care | Payer: No Typology Code available for payment source | Attending: Gastroenterology | Admitting: Gastroenterology

## 2023-09-05 ENCOUNTER — Ambulatory Visit (HOSPITAL_BASED_OUTPATIENT_CLINIC_OR_DEPARTMENT_OTHER): Payer: No Typology Code available for payment source | Admitting: Registered Nurse

## 2023-09-05 ENCOUNTER — Other Ambulatory Visit: Payer: Self-pay

## 2023-09-05 ENCOUNTER — Ambulatory Visit (HOSPITAL_COMMUNITY): Payer: No Typology Code available for payment source | Admitting: Registered Nurse

## 2023-09-05 ENCOUNTER — Encounter (HOSPITAL_COMMUNITY)
Admission: RE | Disposition: A | Discharge status: Home / Self Care | Payer: Self-pay | Source: Home / Self Care | Attending: Gastroenterology

## 2023-09-05 ENCOUNTER — Encounter (HOSPITAL_COMMUNITY): Payer: Self-pay | Admitting: Gastroenterology

## 2023-09-05 DIAGNOSIS — I899 Noninfective disorder of lymphatic vessels and lymph nodes, unspecified: Secondary | ICD-10-CM | POA: Insufficient documentation

## 2023-09-05 DIAGNOSIS — I700 Atherosclerosis of aorta: Secondary | ICD-10-CM | POA: Diagnosis not present

## 2023-09-05 DIAGNOSIS — I69954 Hemiplegia and hemiparesis following unspecified cerebrovascular disease affecting left non-dominant side: Secondary | ICD-10-CM | POA: Diagnosis not present

## 2023-09-05 DIAGNOSIS — K449 Diaphragmatic hernia without obstruction or gangrene: Secondary | ICD-10-CM | POA: Insufficient documentation

## 2023-09-05 DIAGNOSIS — M797 Fibromyalgia: Secondary | ICD-10-CM | POA: Insufficient documentation

## 2023-09-05 DIAGNOSIS — K2970 Gastritis, unspecified, without bleeding: Secondary | ICD-10-CM | POA: Insufficient documentation

## 2023-09-05 DIAGNOSIS — I420 Dilated cardiomyopathy: Secondary | ICD-10-CM | POA: Insufficient documentation

## 2023-09-05 DIAGNOSIS — K319 Disease of stomach and duodenum, unspecified: Secondary | ICD-10-CM | POA: Diagnosis not present

## 2023-09-05 DIAGNOSIS — K219 Gastro-esophageal reflux disease without esophagitis: Secondary | ICD-10-CM | POA: Diagnosis not present

## 2023-09-05 DIAGNOSIS — Z800 Family history of malignant neoplasm of digestive organs: Secondary | ICD-10-CM | POA: Diagnosis not present

## 2023-09-05 DIAGNOSIS — I509 Heart failure, unspecified: Secondary | ICD-10-CM | POA: Diagnosis not present

## 2023-09-05 DIAGNOSIS — Z87891 Personal history of nicotine dependence: Secondary | ICD-10-CM | POA: Diagnosis not present

## 2023-09-05 DIAGNOSIS — I480 Paroxysmal atrial fibrillation: Secondary | ICD-10-CM | POA: Diagnosis not present

## 2023-09-05 DIAGNOSIS — Z860100 Personal history of colon polyps, unspecified: Secondary | ICD-10-CM | POA: Diagnosis not present

## 2023-09-05 DIAGNOSIS — I110 Hypertensive heart disease with heart failure: Secondary | ICD-10-CM | POA: Insufficient documentation

## 2023-09-05 DIAGNOSIS — Z7901 Long term (current) use of anticoagulants: Secondary | ICD-10-CM | POA: Insufficient documentation

## 2023-09-05 DIAGNOSIS — I4891 Unspecified atrial fibrillation: Secondary | ICD-10-CM

## 2023-09-05 DIAGNOSIS — K3189 Other diseases of stomach and duodenum: Secondary | ICD-10-CM | POA: Diagnosis not present

## 2023-09-05 DIAGNOSIS — K2289 Other specified disease of esophagus: Secondary | ICD-10-CM | POA: Diagnosis not present

## 2023-09-05 HISTORY — DX: Gastro-esophageal reflux disease without esophagitis: K21.9

## 2023-09-05 HISTORY — DX: Cardiac arrhythmia, unspecified: I49.9

## 2023-09-05 HISTORY — PX: ESOPHAGOGASTRODUODENOSCOPY: SHX5428

## 2023-09-05 HISTORY — PX: EUS: SHX5427

## 2023-09-05 HISTORY — DX: Other complications of anesthesia, initial encounter: T88.59XA

## 2023-09-05 SURGERY — UPPER ENDOSCOPIC ULTRASOUND (EUS) RADIAL
Anesthesia: Monitor Anesthesia Care

## 2023-09-05 MED ORDER — DEXMEDETOMIDINE HCL IN NACL 80 MCG/20ML IV SOLN
INTRAVENOUS | Status: AC
  Filled 2023-09-05: qty 20

## 2023-09-05 MED ORDER — PROPOFOL 1000 MG/100ML IV EMUL
INTRAVENOUS | Status: AC
  Filled 2023-09-05: qty 100

## 2023-09-05 MED ORDER — PROPOFOL 500 MG/50ML IV EMUL
INTRAVENOUS | Status: DC | PRN
  Administered 2023-09-05: 50 mg via INTRAVENOUS
  Administered 2023-09-05: 100 ug/kg/min via INTRAVENOUS
  Administered 2023-09-05 (×2): 50 mg via INTRAVENOUS

## 2023-09-05 MED ORDER — METOPROLOL TARTRATE 5 MG/5ML IV SOLN
INTRAVENOUS | Status: DC | PRN
Start: 2023-09-05 — End: 2023-09-05
  Administered 2023-09-05 (×2): 2.5 mg via INTRAVENOUS

## 2023-09-05 MED ORDER — LIDOCAINE HCL (CARDIAC) PF 100 MG/5ML IV SOSY
PREFILLED_SYRINGE | INTRAVENOUS | Status: DC | PRN
Start: 2023-09-05 — End: 2023-09-05
  Administered 2023-09-05: 100 mg via INTRAVENOUS

## 2023-09-05 MED ORDER — PROPOFOL 10 MG/ML IV BOLUS
INTRAVENOUS | Status: AC
  Filled 2023-09-05: qty 20

## 2023-09-05 MED ORDER — DEXMEDETOMIDINE HCL IN NACL 200 MCG/50ML IV SOLN
INTRAVENOUS | Status: DC | PRN
Start: 2023-09-05 — End: 2023-09-05
  Administered 2023-09-05: 4 ug via INTRAVENOUS
  Administered 2023-09-05 (×4): 8 ug via INTRAVENOUS

## 2023-09-05 MED ORDER — LACTATED RINGERS IV SOLN: INTRAVENOUS | Status: DC | PRN

## 2023-09-05 NOTE — Anesthesia Preprocedure Evaluation (Addendum)
Anesthesia Evaluation  Patient identified by MRN, date of birth, ID band Patient awake    Reviewed: Allergy & Precautions, NPO status , Patient's Chart, lab work & pertinent test results  History of Anesthesia Complications (+) PONV and history of anesthetic complications  Airway Mallampati: III  TM Distance: >3 FB Neck ROM: Full    Dental  (+) Edentulous Upper   Pulmonary former smoker   Pulmonary exam normal breath sounds clear to auscultation       Cardiovascular hypertension (113/81  preop), Pt. on medications +CHF (last echo 03/2023 grade 1 diastolic dysfunction, LVEF 45-50%, mild reduction in RV systolic function)  Normal cardiovascular exam+ dysrhythmias (xarelto) Atrial Fibrillation + Valvular Problems/Murmurs (mild MR) MR  Rhythm:Regular Rate:Normal  Echo 03/2023  1. Left ventricular ejection fraction, by estimation, is 50 to 55%. The  left ventricle has low normal function. The left ventricle has no regional  wall motion abnormalities. Left ventricular diastolic parameters are  consistent with Grade I diastolic  dysfunction (impaired relaxation).   2. Right ventricular systolic function is mildly reduced. The right  ventricular size is normal.   3. Left atrial size was mildly dilated.   4. The mitral valve is normal in structure. Mild mitral valve  regurgitation. No evidence of mitral stenosis.   5. The aortic valve is tricuspid. There is mild calcification of the  aortic valve. Aortic valve regurgitation is not visualized. Aortic valve  sclerosis/calcification is present, without any evidence of aortic  stenosis.   6. The inferior vena cava is normal in size with greater than 50%  respiratory variability, suggesting right atrial pressure of 3 mmHg.      Neuro/Psych  Headaches PSYCHIATRIC DISORDERS Anxiety     CVA (2016, residual L weakness), Residual Symptoms    GI/Hepatic Neg liver ROS,GERD  Controlled,,Duodenal  lesion   Endo/Other  negative endocrine ROS    Renal/GU negative Renal ROS  negative genitourinary   Musculoskeletal  (+) Arthritis , Osteoarthritis,  Fibromyalgia -  Abdominal   Peds  Hematology negative hematology ROS (+)   Anesthesia Other Findings   Reproductive/Obstetrics negative OB ROS                             Anesthesia Physical Anesthesia Plan  ASA: 3  Anesthesia Plan: MAC   Post-op Pain Management:    Induction:   PONV Risk Score and Plan: 2 and Propofol infusion and TIVA  Airway Management Planned: Natural Airway and Simple Face Mask  Additional Equipment: None  Intra-op Plan:   Post-operative Plan:   Informed Consent: I have reviewed the patients History and Physical, chart, labs and discussed the procedure including the risks, benefits and alternatives for the proposed anesthesia with the patient or authorized representative who has indicated his/her understanding and acceptance.       Plan Discussed with: CRNA  Anesthesia Plan Comments:         Anesthesia Quick Evaluation

## 2023-09-05 NOTE — H&P (Signed)
GASTROENTEROLOGY PROCEDURE H&P NOTE   Primary Care Physician: [name], [name], MD  HPI: Alicia Bolton is a 67 y.o. female who presents for EGD/EUS to evaluate a duodenal SEL.  Past Medical History:  Diagnosis Date   Anxiety    takes Ativan daily as needed   Arthritis    Carpal tunnel syndrome 07/17/2013   Chest mass 04/17/2015   Chronic migraine 02/08/2016   Complication of anesthesia    DDD (degenerative disc disease), lumbar 04/17/2017   Dilated cardiomyopathy (HCC) 12/09/2019   EF 45-50 by echocardiogram // Myoview 11/2019: EF 46, no ischemia, mild apical thinning artifact; Low Risk // Echo 4/23: EF 45-50, no RWMA, GR 1 DD, GLS -15.2, normal RVSF, normal PASP, RVSP 25.3, mild MR, AV sclerosis without stenosis // TTE 04/09/2023: EF 50-55, no RWMA, GR 1 DD, mildly reduced RVSF, mild LAE, mild MR, AV sclerosis, RAP 3, average GLS -16.9   DISORDER, TOBACCO USE 06/27/2007   Qualifier: Diagnosis of  By: Reche Dixon MD, David     Dysrhythmia    Afib   Elevated liver function tests 10/04/2014   Fibromyalgia    GAD (generalized anxiety disorder) 10/10/2017   GERD (gastroesophageal reflux disease)    History of bronchitis    few yrs ago   History of cardiovascular stress test    ETT 05/09/23: normal   History of colon polyps    benign   History of migraine    last one over a month ago   Hyperlipidemia    takes Atorvastatin daily   Hypertension    takes Clonidine and HCTZ daily   Joint pain    Migraines    Muscle spasms of neck 06/06/2016   Neuromuscular disorder (HCC)    Neuropathy 04/25/2016   Nocturia    PAF (paroxysmal atrial fibrillation) (HCC) 09/17/2018   PONV (postoperative nausea and vomiting)    does ok with profolol   Recurrent boils 04/25/2016   Spinal stenosis of cervical region 10/25/2014   Stroke (HCC) 09/2015   Embolic Right ACA,  ILR placed Dr. Johney Frame.States slight weakness in left   Tear of medial meniscus of right knee 10/09/2013   Past  Surgical History:  Procedure Laterality Date   ABDOMINAL HYSTERECTOMY  1997   ANTERIOR CERVICAL DECOMP/DISCECTOMY FUSION N/A 10/25/2014   Procedure: CERVICAL THREE-FOUR, CERVICAL FOUR FIVE ANTERIOR CERVICAL DECOMPRESSION/DISCECTOMY FUSION 2 LEVEL/HARDWARE REMOVALCERVICAL FIVE-SEVEN;  Surgeon: Mariam Dollar, MD;  Location: [facility];  Service: Neurosurgery;  Laterality: N/A;   BREAST DUCTAL SYSTEM EXCISION Left 11/14/2016   Procedure: EXCISION DUCTAL SYSTEM LEFT BREAST;  Surgeon: Chevis Pretty III, MD;  Location: [facility] OR;  Service: General;  Laterality: Left;   BREAST EXCISIONAL BIOPSY Right    benign   BREAST EXCISIONAL BIOPSY Left    benign   CARPAL TUNNEL RELEASE Left 10/25/2014   Procedure: CARPAL TUNNEL RELEASE;  Surgeon: Mariam Dollar, MD;  Location: [facility];  Service: Neurosurgery;  Laterality: Left;   CARPAL TUNNEL RELEASE Right    CERVICAL FUSION  2007   x2   2012   CESAREAN SECTION  1985   CHEST SURGERY  04/2015   Benign chest mass removed- [facility]    CHOLECYSTECTOMY  1998   COLONOSCOPY     EP IMPLANTABLE DEVICE N/A 10/06/2015   Procedure: Loop Recorder Insertion;  Surgeon: Hillis Range, MD;  Location: [facility];  Service: Cardiovascular;  Laterality: N/A;   implantable loop recorder removal  08/10/2019  Medtronic Reveal LINQ removed in office by Dr Johney Frame   KNEE ARTHROSCOPY Right 1999   KNEE ARTHROSCOPY WITH MEDIAL MENISECTOMY Right 10/09/2013   Procedure: RIGHT KNEE ARTHROSCOPY WITH MEDIAL MENISECTOMY;  Surgeon: Eulas Post, MD;  Location: [facility];  Service: Orthopedics;  Laterality: Right;  clean   TEE WITHOUT CARDIOVERSION N/A 10/05/2015   Procedure: TRANSESOPHAGEAL ECHOCARDIOGRAM (TEE);  Surgeon: Vesta Mixer, MD;  Location: [facility];  Service: Cardiovascular;  Laterality: N/A;   thumb surgery Right    No current facility-administered medications for this encounter.   No current facility-administered medications for this  encounter. Allergies  Allergen Reactions   Azithromycin Nausea And Vomiting   Baclofen Nausea And Vomiting   Other     "Not supposed to take Tylenol or ASA"   Ofloxacin Nausea And Vomiting and Rash    Makes sick to stomach and vomit  Other Reaction(s): GI Intolerance   Penicillin G Rash    Other reaction(s): Unknown  Other Reaction(s): GI Intolerance   Penicillins Rash    Has patient had a PCN reaction causing immediate rash, facial/tongue/throat swelling, SOB or lightheadedness with hypotension: No Has patient had a PCN reaction causing severe rash involving mucus membranes or skin necrosis: No Has patient had a PCN reaction that required hospitalization No Has patient had a PCN reaction occurring within the last 10 years: No If all of the above answers are "NO", then may proceed with Cephalosporin use.    Spironolactone     BP increased; Headaches   Family History  Problem Relation Age of Onset   Hypertension Mother    Diabetes Mother    Colon cancer Maternal Grandmother    Cancer Maternal Grandmother        colon   Stroke Maternal Grandmother    Colon polyps Maternal Grandmother    Pancreatic cancer Neg Hx    Esophageal cancer Neg Hx    Stomach cancer Neg Hx    Social History   Socioeconomic History   Marital status: Single    Spouse name: Not on file   Number of children: Not on file   Years of education: Not on file   Highest education level: Not on file  Occupational History   Not on file  Tobacco Use   Smoking status: Former    Current packs/day: 0.00    Types: Cigarettes    Quit date: 07/07/2014    Years since quitting: 9.1   Smokeless tobacco: Never   Tobacco comments:    may smoke 2 cigarettes per month  Vaping Use   Vaping status: Never Used  Substance and Sexual Activity   Alcohol use: Not Currently   Drug use: No   Sexual activity: Not Currently    Birth control/protection: Surgical  Other Topics Concern   Not on file  Social History  Narrative   Not on file   Social Determinants of Health   Financial Resource Strain: Low Risk  (03/12/2023)   Overall Financial Resource Strain (CARDIA)    Difficulty of Paying Living Expenses: Not hard at all  Food Insecurity: No Food Insecurity (03/12/2023)   Hunger Vital Sign    Worried About Running Out of Food in the Last Year: Never true    Ran Out of Food in the Last Year: Never true  Transportation Needs: Unmet Transportation Needs (03/12/2023)   PRAPARE - Administrator, Civil Service (Medical): Yes    Lack of Transportation (Non-Medical): Yes  Physical  Activity: Sufficiently Active (03/12/2023)   Exercise Vital Sign    Days of Exercise per Week: 7 days    Minutes of Exercise per Session: 40 min  Stress: No Stress Concern Present (03/12/2023)   [employer] of Occupational Health - Occupational Stress Questionnaire    Feeling of Stress : Not at all  Social Connections: Socially Isolated (03/12/2023)   Social Connection and Isolation Panel [NHANES]    Frequency of Communication with Friends and Family: More than three times a week    Frequency of Social Gatherings with Friends and Family: More than three times a week    Attends Religious Services: Never    Database administrator or Organizations: No    Attends Banker Meetings: Never    Marital Status: Divorced  Catering manager Violence: Not At Risk (03/12/2023)   Humiliation, Afraid, Rape, and Kick questionnaire    Fear of Current or Ex-Partner: No    Emotionally Abused: No    Physically Abused: No    Sexually Abused: No    Physical Exam: There were no vitals filed for this visit. There is no height or weight on file to calculate BMI. GEN: NAD EYE: Sclerae anicteric ENT: MMM CV: Non-tachycardic GI: Soft, NT/ND NEURO:  Alert & Oriented x 3  Lab Results: No results for input(s): "WBC", "HGB", "HCT", "PLT" in the last 72 hours. BMET No results for input(s): "NA", "K", "CL", "CO2",  "GLUCOSE", "BUN", "CREATININE", "CALCIUM" in the last 72 hours. LFT No results for input(s): "PROT", "ALBUMIN", "AST", "ALT", "ALKPHOS", "BILITOT", "BILIDIR", "IBILI" in the last 72 hours. PT/INR No results for input(s): "LABPROT", "INR" in the last 72 hours.   Impression / Plan: This is a 67 y.o.female who presents for EGD/EUS to evaluate a duodenal SEL.  The risks of an EUS including intestinal perforation, bleeding, infection, aspiration, and medication effects were discussed as was the possibility it may not give a definitive diagnosis if a biopsy is performed.  When a biopsy of the pancreas is done as part of the EUS, there is an additional risk of pancreatitis at the rate of about 1-2%.  It was explained that procedure related pancreatitis is typically mild, although it can be severe and even life threatening, which is why we do not perform random pancreatic biopsies and only biopsy a lesion/area we feel is concerning enough to warrant the risk.  The risks and benefits of endoscopic evaluation/treatment were discussed with the patient and/or family; these include but are not limited to the risk of perforation, infection, bleeding, missed lesions, lack of diagnosis, severe illness requiring hospitalization, as well as anesthesia and sedation related illnesses.  The patient's history has been reviewed, patient examined, no change in status, and deemed stable for procedure.  The patient and/or family is agreeable to proceed.    Corliss Parish, MD  Gastroenterology Advanced Endoscopy Office # [phone]

## 2023-09-05 NOTE — Op Note (Signed)
[facility] Patient Name: Alicia Bolton Procedure Date: 09/05/2023 MRN: [id number] Attending MD: Corliss Parish , MD, [id number] Date of Birth: 05/19/1956 CSN: [id number] Age: 67 Admit Type: Outpatient Procedure:                Upper EUS Indications:              Duodenal deformity on endoscopy/Subepithelial tumor                            vs. extrinsic compression, Large mucosal folds                            found in the duodenum on endoscopy Providers:                Corliss Parish, MD, Fransisca Connors, Salley Scarlet, Technician, Crystal Grant,CRNA Referring MD:             Madelyn Brunner" Leonides Schanz Medicines:                Monitored Anesthesia Care Complications:            No immediate complications. Estimated Blood Loss:     Estimated blood loss was minimal. Procedure:                Pre-Anesthesia Assessment:                           - Prior to the procedure, a History and Physical                            was performed, and patient medications and                            allergies were reviewed. The patient's tolerance of                            previous anesthesia was also reviewed. The risks                            and benefits of the procedure and the sedation                            options and risks were discussed with the patient.                            All questions were answered, and informed consent                            was obtained. Prior Anticoagulants: The patient has                            taken Xarelto (rivaroxaban), last dose was 2 days  prior to procedure. ASA Grade Assessment: III - A                            patient with severe systemic disease. After                            reviewing the risks and benefits, the patient was                            deemed in satisfactory condition to undergo the                            procedure.                            After obtaining informed consent, the endoscope was                            passed under direct vision. Throughout the                            procedure, the patient's blood pressure, pulse, and                            oxygen saturations were monitored continuously. The                            [id number] ([id number]) Olympus therapeutic endoscope                            was introduced through the mouth, and advanced to                            the second part of duodenum. The [id number]                            ([id number]) Olympus duodenoscope was introduced                            through the mouth, and advanced to the area of                            papilla. The [id number]-[id number] ([id number]) Olympus radial                            ultrasound scope was introduced through the mouth,                            and advanced to the duodenum for ultrasound                            examination from the stomach and duodenum. The  [id number] ([id number]) Olympus linear ultrasound scope                            was introduced through the mouth, and advanced to                            the duodenum for ultrasound examination from the                            stomach and duodenum. The upper EUS was                            accomplished without difficulty. The patient                            tolerated the procedure. Scope In: Scope Out: Findings:      ENDOSCOPIC FINDING: :      No gross lesions were noted in the entire esophagus.      The Z-line was irregular and was found 35 cm from the incisors.      A 3 cm hiatal hernia was present.      Segmental moderate inflammation characterized by erythema, friability       and granularity was found in the entire examined stomach. Previously       biopsied and negatie for HP.      Congested mucosa without active bleeding and with no stigmata of       bleeding was found in the area of the minor  papilla.      The major papilla was normal.      No other gross lesions were noted in the duodenal bulb, in the first       portion of the duodenum and in the second portion of the duodenum.      ENDOSONOGRAPHIC FINDING: :      There was no sign of significant endosonographic abnormality in the       pancreatic head, genu of the pancreas, pancreatic body and pancreatic       tail.      Localized wall thickening was visualized endosonographically in the area       of minor papilla, within the duodenum. This appeared to primarily be due       to thickening within the deep mucosa (Layer 2) and submucosa (Layer 3).       The thickness of the abnormal layers.      Endosonographic imaging of the ampulla showed no intramural       (subepithelial) lesion.      The diameter of the main pancreatic duct (MPD) measured:      - HOP 2.1 mm (head of pancreas)      - NOP 2.8 mm (neck of pancreas)      - BOP 1.7 mm (body of the pancreas)      - TOP 1.0 mm (tail of the pancreas).      There was no sign of significant endosonographic abnormality in the       common bile duct (5.5 mm). Ducts with regular contour were identified.      Endosonographic imaging in the visualized portion of the liver showed no       mass.      No malignant-appearing lymph  nodes were visualized in the celiac region       (level 20) and perigastric region.      The celiac was visualized. Impression:               EGD Impression:                           - No gross lesions in the entire esophagus. Z-line                            irregular, 35 cm from the incisors.                           - 3 cm hiatal hernia.                           - Gastritis - previously biopsied and negative for                            HP.                           - Congested duodenal mucosa at the area of the                            minor papilla.                           - Normal major papilla.                           - No other gross  lesions in the duodenal bulb, in                            the first portion of the duodenum and in the second                            portion of the duodenum.                           EUS Impression:                           - There was no sign of significant pathology in the                            pancreatic head, genu of the pancreas, pancreatic                            body and pancreatic tail. Endosonographically, the                            MPD had a normal appearance.                           - Wall thickening was seen in the  area of minor                            papilla, within the duodenum. It appeared to                            primarily be within the deep mucosa (Layer 2) and                            submucosa (Layer 3). This is the area of concern                            previously based on similar endoscopic pictures (no                            evidence of a SEL).                           - No intramural lesion at the ampulla.                           - There was no sign of significant pathology in the                            common bile duct.                           - No malignant-appearing lymph nodes were                            visualized in the celiac region (level 20) and                            perigastric region. Moderate Sedation:      Not Applicable - Patient had care per Anesthesia. Recommendation:           - The patient will be observed post-procedure,                            until all discharge criteria are met.                           - Discharge patient to home.                           - Patient has a contact number available for                            emergencies. The signs and symptoms of potential                            delayed complications were discussed with the                            patient. Return to normal activities tomorrow.  Written discharge instructions were  provided to the                            patient.                           - Resume previous diet.                           - Observe patient's clinical course.                           - Further workup as per primary GI.                           - The findings and recommendations were discussed                            with the patient.                           - The findings and recommendations were discussed                            with the patient's family. Procedure Code(s):        --- Professional ---                           [phone], Esophagogastroduodenoscopy, flexible,                            transoral; with endoscopic ultrasound examination                            limited to the esophagus, stomach or duodenum, and                            adjacent structures Diagnosis Code(s):        --- Professional ---                           K22.89, Other specified disease of esophagus                           K44.9, Diaphragmatic hernia without obstruction or                            gangrene                           K29.70, Gastritis, unspecified, without bleeding                           K31.89, Other diseases of stomach and duodenum                           I89.9, Noninfective disorder of lymphatic vessels  and lymph nodes, unspecified CPT copyright 2022 American Medical Association. All rights reserved. The codes documented in this report are preliminary and upon coder review may  be revised to meet current compliance requirements. Corliss Parish, MD 09/05/2023 9:56:42 AM Number of Addenda: 0

## 2023-09-05 NOTE — Discharge Instructions (Signed)

## 2023-09-05 NOTE — Anesthesia Postprocedure Evaluation (Signed)
Anesthesia Post Note  Patient: Alicia Bolton  Procedure(s) Performed: UPPER ENDOSCOPIC ULTRASOUND (EUS) RADIAL ESOPHAGOGASTRODUODENOSCOPY (EGD)     Patient location during evaluation: PACU Anesthesia Type: MAC Level of consciousness: awake and alert Pain management: pain level controlled Vital Signs Assessment: post-procedure vital signs reviewed and stable Respiratory status: spontaneous breathing, nonlabored ventilation and respiratory function stable Cardiovascular status: blood pressure returned to baseline and stable Postop Assessment: no apparent nausea or vomiting Anesthetic complications: no   No notable events documented.  Last Vitals:  Vitals:   09/05/23 0949 09/05/23 0959  BP: 116/73 136/85  Pulse: 76 70  Resp: 20 18  Temp:    SpO2: 95% 99%    Last Pain:  Vitals:   09/05/23 0959  TempSrc:   PainSc: 0-No pain                 Lannie Fields

## 2023-09-05 NOTE — Transfer of Care (Signed)
Immediate Anesthesia Transfer of Care Note  Patient: Alicia Bolton  Procedure(s) Performed: UPPER ENDOSCOPIC ULTRASOUND (EUS) RADIAL ESOPHAGOGASTRODUODENOSCOPY (EGD)  Patient Location: Endoscopy Unit  Anesthesia Type:MAC  Level of Consciousness: drowsy  Airway & Oxygen Therapy: Patient Spontanous Breathing and Patient connected to face mask oxygen  Post-op Assessment: Report given to RN  Post vital signs: Reviewed and stable  Last Vitals:  Vitals Value Taken Time  BP 110/72 09/05/23 0941  Temp 35.9 C 09/05/23 0941  Pulse 81 09/05/23 0942  Resp 25 09/05/23 0942  SpO2 97 % 09/05/23 0942  Vitals shown include unfiled device data.  Last Pain:  Vitals:   09/05/23 0941  TempSrc: Tympanic  PainSc: 0-No pain         Complications: No notable events documented.

## 2023-09-05 NOTE — Anesthesia Procedure Notes (Signed)
Procedure Name: MAC Date/Time: 09/05/2023 9:05 AM  Performed by: Lily Lovings, CRNAPre-anesthesia Checklist: Patient identified, Emergency Drugs available, Suction available and Patient being monitored Patient Re-evaluated:Patient Re-evaluated prior to induction Oxygen Delivery Method: Simple face mask Preoxygenation: Pre-oxygenation with 100% oxygen Induction Type: IV induction Comments: pom

## 2023-09-06 ENCOUNTER — Encounter: Payer: Self-pay | Admitting: Internal Medicine

## 2023-09-08 ENCOUNTER — Encounter (HOSPITAL_COMMUNITY): Payer: Self-pay | Admitting: Gastroenterology

## 2023-09-10 ENCOUNTER — Encounter: Payer: Self-pay | Admitting: Internal Medicine

## 2023-09-10 MED ORDER — OMEPRAZOLE 40 MG PO CPDR: 40.0000 mg | DELAYED_RELEASE_CAPSULE | Freq: Every day | ORAL | 1 refills | Status: DC

## 2023-10-03 ENCOUNTER — Encounter: Payer: Self-pay | Admitting: Internal Medicine

## 2023-10-03 DIAGNOSIS — Z13820 Encounter for screening for osteoporosis: Secondary | ICD-10-CM

## 2023-10-14 ENCOUNTER — Ambulatory Visit: Payer: No Typology Code available for payment source | Admitting: Internal Medicine

## 2023-10-14 DIAGNOSIS — R7303 Prediabetes: Secondary | ICD-10-CM

## 2023-10-28 ENCOUNTER — Encounter: Payer: Self-pay | Admitting: Internal Medicine

## 2023-11-14 ENCOUNTER — Encounter: Payer: Self-pay | Admitting: Internal Medicine

## 2023-11-18 NOTE — Progress Notes (Unsigned)
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 Echocardiogram 09/2019: EF 45-50, GLS -20.4 Myoview 2/21: EF 46, no ischemia; low risk Intol of Spironolactone (BP); beta-blocker (hair loss) Echo 01/30/2022: EF 45-50, GLS -15.2 TTE 04/09/2023: EF 50-55, no RWMA, GR 1 DD, mild reduced RVSF, mild LAE, mild MR, AV sclerosis, RAP 3, average GLS -16.9 ETT 05/08/23: neg for ischemia; no arrhythmias  Hyperlipidemia Hypertension 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  Alicia Bolton is a 68 y.o. female who returns for follow-up of A-fib, CHF.  She was last seen in June 2024.  She noted some chest discomfort that sounded noncardiac.  Exercise treadmill test was arranged and was low risk. She is here alone. She recently switched insurance companies and requires her prescriptions to be sent to a home pharmacy service. The patient is particularly concerned about her Xarelto prescription, as she only has a week's supply left. Since the last visit, the patient underwent an endoscopy where several precancerous nodules were removed. One nodule, located in the duodenum of the small intestines, was of particular concern due to its unusual location and appearance. The nodule was ultimately determined to be benign. The patient  reports feeling generally well, with no chest pain or shortness of breath. She occasionally experiences lightheadedness when rising from a prolonged stooped position. She also reports occasional migraines, which she manages by avoiding known triggers. The patient has not experienced any falls recently. She had COVID a few months ago and lost her appetite. She lost weight, down to 121 pounds. However, she has since gained back some weight, now weighing 127 pounds.     Review of Systems  Constitutional: Positive for weight loss.  -See HPI    Studies Reviewed:       Creatinine clearance: 53.53 mL/min      Risk Assessment/Calculations:    CHA2DS2-VASc Score = 7   This indicates a 11.2% annual risk of stroke. The patient's score is based upon: CHF History: 1 HTN History: 1 Diabetes History: 0 Stroke History: 2 Vascular Disease History: 1 Age Score: 1 Gender Score: 1            Physical Exam:   VS:  BP 100/60   Pulse 78   Ht 5\' 6"  (1.676 m)   Wt 127 lb 12.8 oz (58 kg)   SpO2 98%   BMI 20.63 kg/m    Wt Readings from Last 3 Encounters:  11/19/23 127 lb 12.8 oz (58 kg)  08/26/23 124 lb (56.2 kg)  06/26/23 140 lb (63.5 kg)    Constitutional:      Appearance: Healthy appearance. Not in distress.  Neck:     Vascular: JVD normal.  Pulmonary:     Breath sounds: Normal breath sounds. No wheezing. No rales.  Cardiovascular:     Normal  rate. Regular rhythm.     Murmurs: There is no murmur.  Edema:    Peripheral edema absent.  Abdominal:     Palpations: Abdomen is soft.        Assessment and Plan:   Assessment & Plan Paroxysmal atrial fibrillation (HCC) Maintaining NSR by exam. Recent CrCl 53 mL/min.  -Continue Xarelto 20 mg once daily  -Follow up 6 mos.  Dilated cardiomyopathy (HCC) EF has improved to low normal by last echocardiogram in 03/2023.  -Continue Losartan 25 mg once daily  -She was previously intol of beta-blocker (hair loss), spironolactone (BP  increased) Essential hypertension BP is well controlled.  -Continue hydrochlorothiazide 25 mg once daily, clonidine 0.1 mg twice daily, Losartan 25 mg once daily, K+10 mEq once daily.      Dispo:  Return in about 6 months (around 05/18/2024) for Routine Follow Up, w/ [name], PA-C.  Signed, [name], PA-C

## 2023-11-18 NOTE — Telephone Encounter (Signed)
Left message on machine for patient to schedule an in office visit.

## 2023-11-19 ENCOUNTER — Encounter: Payer: Self-pay | Admitting: Physician Assistant

## 2023-11-19 ENCOUNTER — Encounter: Payer: Self-pay | Admitting: Internal Medicine

## 2023-11-19 ENCOUNTER — Ambulatory Visit: Payer: Medicare (Managed Care) | Attending: Physician Assistant | Admitting: Physician Assistant

## 2023-11-19 ENCOUNTER — Other Ambulatory Visit: Payer: Self-pay

## 2023-11-19 VITALS — BP 100/60 | HR 78 | Ht 66.0 in | Wt 127.8 lb

## 2023-11-19 DIAGNOSIS — I480 Paroxysmal atrial fibrillation: Secondary | ICD-10-CM | POA: Diagnosis not present

## 2023-11-19 DIAGNOSIS — I420 Dilated cardiomyopathy: Secondary | ICD-10-CM

## 2023-11-19 DIAGNOSIS — I10 Essential (primary) hypertension: Secondary | ICD-10-CM | POA: Diagnosis not present

## 2023-11-19 MED ORDER — ATORVASTATIN CALCIUM 40 MG PO TABS: 40.0000 mg | ORAL_TABLET | Freq: Every day | ORAL | 1 refills | Status: DC

## 2023-11-19 MED ORDER — POTASSIUM CHLORIDE CRYS ER 10 MEQ PO TBCR: 10.0000 meq | EXTENDED_RELEASE_TABLET | Freq: Every day | ORAL | 3 refills | Status: DC

## 2023-11-19 MED ORDER — HYDROCHLOROTHIAZIDE 25 MG PO TABS: 25.0000 mg | ORAL_TABLET | Freq: Every day | ORAL | 3 refills | Status: DC

## 2023-11-19 MED ORDER — OMEPRAZOLE 40 MG PO CPDR: 40.0000 mg | DELAYED_RELEASE_CAPSULE | Freq: Every day | ORAL | 1 refills | Status: DC

## 2023-11-19 MED ORDER — ATORVASTATIN CALCIUM 40 MG PO TABS: 40.0000 mg | ORAL_TABLET | Freq: Every day | ORAL | 3 refills | Status: DC

## 2023-11-19 MED ORDER — RIVAROXABAN 20 MG PO TABS: ORAL_TABLET | ORAL | 0 refills | Status: DC

## 2023-11-19 MED ORDER — LOSARTAN POTASSIUM 25 MG PO TABS: 25.0000 mg | ORAL_TABLET | Freq: Every day | ORAL | 3 refills | Status: DC

## 2023-11-19 MED ORDER — CLONIDINE HCL 0.1 MG PO TABS: 0.1000 mg | ORAL_TABLET | Freq: Two times a day (BID) | ORAL | 3 refills | Status: DC

## 2023-11-19 MED ORDER — ACYCLOVIR 400 MG PO TABS: 400.0000 mg | ORAL_TABLET | Freq: Two times a day (BID) | ORAL | 1 refills | Status: DC

## 2023-11-19 MED ORDER — RIVAROXABAN 20 MG PO TABS: 20.0000 mg | ORAL_TABLET | Freq: Every day | ORAL | 3 refills | Status: DC

## 2023-11-19 MED ORDER — RIVAROXABAN 20 MG PO TABS: ORAL_TABLET | ORAL | 3 refills | Status: DC

## 2023-11-19 NOTE — Addendum Note (Signed)
Addended by: Lamar Benes on: 11/19/2023 01:08 PM   Modules accepted: Orders

## 2023-11-19 NOTE — Telephone Encounter (Signed)
Note sent via MyChart.  

## 2023-11-19 NOTE — Assessment & Plan Note (Signed)
Maintaining NSR by exam. Recent CrCl 53 mL/min.  -Continue Xarelto 20 mg once daily  -Follow up 6 mos.

## 2023-11-19 NOTE — Assessment & Plan Note (Signed)
BP is well controlled.  -Continue hydrochlorothiazide 25 mg once daily, clonidine 0.1 mg twice daily, Losartan 25 mg once daily, K+10 mEq once daily.

## 2023-11-19 NOTE — Patient Instructions (Signed)
Medication Instructions:  Your physician recommends that you continue on your current medications as directed. Please refer to the Current Medication list given to you today.  *If you need a refill on your cardiac medications before your next appointment, please call your pharmacy*   Lab Work: NONE ordered at this time of appointment   Testing/Procedures: NONE ordered at this time of appointment   Follow-Up: At [facility], you and your health needs are our priority.  As part of our continuing mission to provide you with exceptional heart care, we have created designated Provider Care Teams.  These Care Teams include your primary Cardiologist (physician) and Advanced Practice Providers (APPs -  Physician Assistants and Nurse Practitioners) who all work together to provide you with the care you need, when you need it.  We recommend signing up for the patient portal called "MyChart".  Sign up information is provided on this After Visit Summary.  MyChart is used to connect with patients for Virtual Visits (Telemedicine).  Patients are able to view lab/test results, encounter notes, upcoming appointments, etc.  Non-urgent messages can be sent to your provider as well.   To learn more about what you can do with MyChart, go to [email or web address].    Your next appointment:   6 month(s)  Provider:   [name], PA-C

## 2023-11-19 NOTE — Assessment & Plan Note (Signed)
EF has improved to low normal by last echocardiogram in 03/2023.  -Continue Losartan 25 mg once daily  -She was previously intol of beta-blocker (hair loss), spironolactone (BP increased)

## 2023-11-26 ENCOUNTER — Encounter: Payer: No Typology Code available for payment source | Admitting: Internal Medicine

## 2024-01-20 ENCOUNTER — Other Ambulatory Visit: Payer: Self-pay | Admitting: Internal Medicine

## 2024-01-20 DIAGNOSIS — Z0000 Encounter for general adult medical examination without abnormal findings: Secondary | ICD-10-CM

## 2024-01-21 ENCOUNTER — Encounter: Payer: Self-pay | Admitting: Internal Medicine

## 2024-01-22 ENCOUNTER — Ambulatory Visit (INDEPENDENT_AMBULATORY_CARE_PROVIDER_SITE_OTHER): Payer: Medicare (Managed Care) | Admitting: Internal Medicine

## 2024-01-22 VITALS — BP 120/78 | HR 94 | Temp 98.2°F | Wt 128.1 lb

## 2024-01-22 DIAGNOSIS — L732 Hidradenitis suppurativa: Secondary | ICD-10-CM | POA: Diagnosis not present

## 2024-01-22 DIAGNOSIS — M2550 Pain in unspecified joint: Secondary | ICD-10-CM

## 2024-01-22 DIAGNOSIS — M7542 Impingement syndrome of left shoulder: Secondary | ICD-10-CM

## 2024-01-22 MED ORDER — HYDROCODONE-ACETAMINOPHEN 10-325 MG PO TABS: 1.0000 | ORAL_TABLET | Freq: Three times a day (TID) | ORAL | 0 refills | Status: AC | PRN

## 2024-01-22 MED ORDER — MELOXICAM 7.5 MG PO TABS: 7.5000 mg | ORAL_TABLET | Freq: Every day | ORAL | 0 refills | Status: AC

## 2024-01-22 NOTE — Progress Notes (Signed)
 Established Patient Office Visit     CC/Reason for Visit: Left shoulder pain  HPI: Alicia Bolton is a 68 y.o. female who is coming in today for the above mentioned reasons.  This has been ongoing for years.  She has seen orthopedics and was diagnosed with left coracoid impingement syndrome.  She received an injection last July and is just now wearing off.  She already has an appointment scheduled to see orthopedics again at the end of the month.  She is also requesting a surgical referral due to recurrent hidradenitis suppurativa.  She mainly gets it in her armpits and groin area.   Past Medical/Surgical History: Past Medical History:  Diagnosis Date   Anxiety    takes Ativan daily as needed   Arthritis    Carpal tunnel syndrome 07/17/2013   Chest mass 04/17/2015   Chronic migraine 02/08/2016   Complication of anesthesia    DDD (degenerative disc disease), lumbar 04/17/2017   Dilated cardiomyopathy (HCC) 12/09/2019   EF 45-50 by echocardiogram // Myoview 11/2019: EF 46, no ischemia, mild apical thinning artifact; Low Risk // Echo 4/23: EF 45-50, no RWMA, GR 1 DD, GLS -15.2, normal RVSF, normal PASP, RVSP 25.3, mild MR, AV sclerosis without stenosis // TTE 04/09/2023: EF 50-55, no RWMA, GR 1 DD, mildly reduced RVSF, mild LAE, mild MR, AV sclerosis, RAP 3, average GLS -16.9   DISORDER, TOBACCO USE 06/27/2007   Qualifier: Diagnosis of  By: Reche Dixon MD, David     Dysrhythmia    Afib   Elevated liver function tests 10/04/2014   Fibromyalgia    GAD (generalized anxiety disorder) 10/10/2017   GERD (gastroesophageal reflux disease)    History of bronchitis    few yrs ago   History of cardiovascular stress test    ETT 05/09/23: normal   History of colon polyps    benign   History of migraine    last one over a month ago   Hyperlipidemia    takes Atorvastatin daily   Hypertension    takes Clonidine and HCTZ daily   Joint pain    Migraines    Muscle spasms of neck 06/06/2016    Neuromuscular disorder (HCC)    Neuropathy 04/25/2016   Nocturia    PAF (paroxysmal atrial fibrillation) (HCC) 09/17/2018   PONV (postoperative nausea and vomiting)    does ok with profolol   Recurrent boils 04/25/2016   Spinal stenosis of cervical region 10/25/2014   Stroke (HCC) 09/2015   Embolic Right ACA,  ILR placed Dr. Johney Frame.States slight weakness in left   Tear of medial meniscus of right knee 10/09/2013    Past Surgical History:  Procedure Laterality Date   ABDOMINAL HYSTERECTOMY  1997   ANTERIOR CERVICAL DECOMP/DISCECTOMY FUSION N/A 10/25/2014   Procedure: CERVICAL THREE-FOUR, CERVICAL FOUR FIVE ANTERIOR CERVICAL DECOMPRESSION/DISCECTOMY FUSION 2 LEVEL/HARDWARE REMOVALCERVICAL FIVE-SEVEN;  Surgeon: Mariam Dollar, MD;  Location: [facility];  Service: Neurosurgery;  Laterality: N/A;   BREAST DUCTAL SYSTEM EXCISION Left 11/14/2016   Procedure: EXCISION DUCTAL SYSTEM LEFT BREAST;  Surgeon: Chevis Pretty III, MD;  Location: [facility] OR;  Service: General;  Laterality: Left;   BREAST EXCISIONAL BIOPSY Right    benign   BREAST EXCISIONAL BIOPSY Left    benign   CARPAL TUNNEL RELEASE Left 10/25/2014   Procedure: CARPAL TUNNEL RELEASE;  Surgeon: Mariam Dollar, MD;  Location: [facility];  Service: Neurosurgery;  Laterality: Left;   CARPAL TUNNEL RELEASE Right  CERVICAL FUSION  2007   x2   2012   CESAREAN SECTION  1985   CHEST SURGERY  04/2015   Benign chest mass removed- [facility]    CHOLECYSTECTOMY  1998   COLONOSCOPY     EP IMPLANTABLE DEVICE N/A 10/06/2015   Procedure: Loop Recorder Insertion;  Surgeon: Hillis Range, MD;  Location: [facility];  Service: Cardiovascular;  Laterality: N/A;   ESOPHAGOGASTRODUODENOSCOPY N/A 09/05/2023   Procedure: ESOPHAGOGASTRODUODENOSCOPY (EGD);  Surgeon: Lemar Lofty., MD;  Location: [facility];  Service: Gastroenterology;  Laterality: N/A;   EUS N/A 09/05/2023   Procedure: UPPER ENDOSCOPIC ULTRASOUND (EUS) RADIAL;  Surgeon:  Lemar Lofty., MD;  Location: [facility];  Service: Gastroenterology;  Laterality: N/A;   implantable loop recorder removal  08/10/2019   [employer]tr[employer] Reveal LINQ removed in office by Dr Johney Frame   KNEE ARTHROSCOPY Right 1999   KNEE ARTHROSCOPY WITH MEDIAL MENISECTOMY Right 10/09/2013   Procedure: RIGHT KNEE ARTHROSCOPY WITH MEDIAL MENISECTOMY;  Surgeon: Eulas Post, MD;  Location: [facility];  Service: Orthopedics;  Laterality: Right;  clean   TEE WITHOUT CARDIOVERSION N/A 10/05/2015   Procedure: TRANSESOPHAGEAL ECHOCARDIOGRAM (TEE);  Surgeon: Vesta Mixer, MD;  Location: [facility];  Service: Cardiovascular;  Laterality: N/A;   thumb surgery Right     Social History:  reports that she quit smoking about 9 years ago. Her smoking use included cigarettes. She has never used smokeless tobacco. She reports that she does not currently use alcohol. She reports that she does not use drugs.  Allergies: Allergies  Allergen Reactions   Azithromycin Nausea And Vomiting   Baclofen Nausea And Vomiting   Other     "Not supposed to take Tylenol or ASA"   Ofloxacin Nausea And Vomiting and Rash    Makes sick to stomach and vomit  Other Reaction(s): GI Intolerance   Penicillin G Rash    Other reaction(s): Unknown  Other Reaction(s): GI Intolerance   Penicillins Rash    Has patient had a PCN reaction causing immediate rash, facial/tongue/throat swelling, SOB or lightheadedness with hypotension: No Has patient had a PCN reaction causing severe rash involving mucus membranes or skin necrosis: No Has patient had a PCN reaction that required hospitalization No Has patient had a PCN reaction occurring within the last 10 years: No If all of the above answers are "NO", then may proceed with Cephalosporin use.    Spironolactone     BP increased; Headaches    Family History:  Family History  Problem Relation Age of Onset   Hypertension Mother    Diabetes Mother     Colon cancer Maternal Grandmother    Cancer Maternal Grandmother        colon   Stroke Maternal Grandmother    Colon polyps Maternal Grandmother    Pancreatic cancer Neg Hx    Esophageal cancer Neg Hx    Stomach cancer Neg Hx      Current Outpatient Medications:    acyclovir (ZOVIRAX) 400 MG tablet, Take 1 tablet (400 mg total) by mouth 2 (two) times daily., Disp: 180 tablet, Rfl: 1   Antiseborrheic Products, Misc. (PROMISEB) CREA, Apply 1 application topically as needed (for skin breakouts)., Disp: , Rfl:    atorvastatin (LIPITOR) 40 MG tablet, Take 1 tablet (40 mg total) by mouth daily., Disp: 90 tablet, Rfl: 3   baclofen 5 MG TABS, Take 2 tablets (10 mg total) by mouth 2 (two) times daily as needed for muscle  spasms., Disp: 15 tablet, Rfl: 0   cloNIDine (CATAPRES) 0.1 MG tablet, Take 1 tablet (0.1 mg total) by mouth 2 (two) times daily., Disp: 180 tablet, Rfl: 3   clotrimazole (LOTRIMIN) 1 % cream, Apply 1 application topically 2 (two) times daily. To face, Disp: 30 g, Rfl: 1   hydrochlorothiazide (HYDRODIURIL) 25 MG tablet, Take 1 tablet (25 mg total) by mouth daily., Disp: 90 tablet, Rfl: 3   LORazepam (ATIVAN) 0.5 MG tablet, Take 1 tablet (0.5 mg total) by mouth 2 (two) times daily as needed. for anxiety, Disp: 30 tablet, Rfl: 1   losartan (COZAAR) 25 MG tablet, Take 1 tablet (25 mg total) by mouth daily., Disp: 90 tablet, Rfl: 3   meloxicam (MOBIC) 7.5 MG tablet, Take 1 tablet (7.5 mg total) by mouth daily., Disp: 30 tablet, Rfl: 0   omeprazole (PRILOSEC) 40 MG capsule, Take 1 capsule (40 mg total) by mouth daily., Disp: 90 capsule, Rfl: 1   potassium chloride (KLOR-CON M10) 10 MEQ tablet, Take 1 tablet (10 mEq total) by mouth daily., Disp: 90 tablet, Rfl: 3   rivaroxaban (XARELTO) 20 MG TABS tablet, TAKE 1 TABLET DAILY WITH   SUPPER, Disp: 14 tablet, Rfl: 0   rivaroxaban (XARELTO) 20 MG TABS tablet, Take 1 tablet (20 mg total) by mouth daily with supper., Disp: 90 tablet, Rfl: 3    triamcinolone cream (KENALOG) 0.1 %, Apply 1 application topically 2 (two) times daily., Disp: 30 g, Rfl: 1   HYDROcodone-acetaminophen (NORCO) 10-325 MG tablet, Take 1 tablet by mouth every 8 (eight) hours as needed. Chronic Pain. Dx: G89.4, Disp: 21 tablet, Rfl: 0  Review of Systems:  Negative unless indicated in HPI.   Physical Exam: Vitals:   01/22/24 0756  BP: 120/78  Pulse: 94  Temp: 98.2 F (36.8 C)  TempSrc: Oral  SpO2: 98%  Weight: 128 lb 1.6 oz (58.1 kg)    Body mass index is 20.68 kg/m.    Impression and Plan:  Coracoid impingement of left shoulder -     Meloxicam; Take 1 tablet (7.5 mg total) by mouth daily.  Dispense: 30 tablet; Refill: 0  Hidradenitis suppurativa -     Ambulatory referral to General Surgery  Polyarthralgia -     HYDROcodone-Acetaminophen; Take 1 tablet by mouth every 8 (eight) hours as needed. Chronic Pain. Dx: G88.4  Dispense: 21 tablet; Refill: 0   -Send some meloxicam to help with her shoulder pain until she can get in with orthopedics.  She is also requesting a refill of hydrocodone.  She was last given 21 tablets in July 2024. -Agree with referral to general surgery for hidradenitis suppurativa.  Time spent:30 minutes reviewing chart, interviewing and examining patient and formulating plan of care.     Chaya Jan, MD [facility]

## 2024-01-27 DIAGNOSIS — M7542 Impingement syndrome of left shoulder: Secondary | ICD-10-CM | POA: Diagnosis not present

## 2024-02-03 ENCOUNTER — Ambulatory Visit: Payer: No Typology Code available for payment source

## 2024-02-03 ENCOUNTER — Encounter: Payer: No Typology Code available for payment source | Admitting: Internal Medicine

## 2024-02-12 ENCOUNTER — Ambulatory Visit (INDEPENDENT_AMBULATORY_CARE_PROVIDER_SITE_OTHER): Admitting: Podiatry

## 2024-02-12 DIAGNOSIS — Z91199 Patient's noncompliance with other medical treatment and regimen due to unspecified reason: Secondary | ICD-10-CM

## 2024-02-12 NOTE — Progress Notes (Signed)
 No show

## 2024-02-20 ENCOUNTER — Telehealth: Payer: Self-pay

## 2024-02-20 ENCOUNTER — Other Ambulatory Visit (HOSPITAL_COMMUNITY): Payer: Self-pay

## 2024-02-20 NOTE — Telephone Encounter (Signed)
 Pharmacy Patient Advocate Encounter  Insurance verification completed.   The patient is insured through [facility]   Ran test claim for XARELTO . Currently a quantity of 30 is a 30 day supply and the co-pay is $139.52 . The current 30 day co-pay is, $139.52.  No PA needed at this time.  This test claim was processed through [facility]- copay amounts may vary at other pharmacies due to pharmacy/plan contracts, or as the patient moves through the different stages of their insurance plan.   Advised nursing that for Xarelto  PAP, pt would need to spend 4% to qualify. If pt hasn't spent enough we should recommend them to call the plan and ask for a medicare payment plan option

## 2024-02-24 ENCOUNTER — Ambulatory Visit
Admission: RE | Admit: 2024-02-24 | Discharge: 2024-02-24 | Disposition: A | Discharge status: Home / Self Care | Payer: Medicare (Managed Care) | Source: Ambulatory Visit | Attending: Internal Medicine | Admitting: Internal Medicine

## 2024-02-24 DIAGNOSIS — Z1231 Encounter for screening mammogram for malignant neoplasm of breast: Secondary | ICD-10-CM | POA: Diagnosis not present

## 2024-02-24 DIAGNOSIS — Z0000 Encounter for general adult medical examination without abnormal findings: Secondary | ICD-10-CM

## 2024-02-25 ENCOUNTER — Other Ambulatory Visit: Payer: Self-pay

## 2024-02-25 ENCOUNTER — Encounter (HOSPITAL_COMMUNITY): Payer: Self-pay

## 2024-02-25 ENCOUNTER — Ambulatory Visit (HOSPITAL_COMMUNITY): Admission: EM | Admit: 2024-02-25 | Discharge: 2024-02-25 | Disposition: A | Discharge status: Home / Self Care

## 2024-02-25 DIAGNOSIS — M67432 Ganglion, left wrist: Secondary | ICD-10-CM

## 2024-02-25 NOTE — ED Provider Notes (Signed)
 [facility]    CSN: [id number] Arrival date & time: 02/25/24  1805      History   Chief Complaint Chief Complaint  Patient presents with   Hand Problem    HPI Alicia [facility] is a 68 y.o. female.   HPI  She reports concerns for swelling of her left wrist  She states the spot developed without injury or known trauma She states she went for a walk with her dog and went to lightly scratch the area and noticed a swollen area along the radial aspect of the wrist (palmar side) She repots this developed earlier today She is concerned for potential bleeding or clot due to being on blood thinners   Past Medical History:  Diagnosis Date   Anxiety    takes Ativan  daily as needed   Arthritis    Carpal tunnel syndrome 07/17/2013   Chest mass 04/17/2015   Chronic migraine 02/08/2016   Complication of anesthesia    DDD (degenerative disc disease), lumbar 04/17/2017   Dilated cardiomyopathy (HCC) 12/09/2019   EF 45-50 by echocardiogram // Myoview  11/2019: EF 46, no ischemia, mild apical thinning artifact; Low Risk // Echo 4/23: EF 45-50, no RWMA, GR 1 DD, GLS -15.2, normal RVSF, normal PASP, RVSP 25.3, mild MR, AV sclerosis without stenosis // TTE 04/09/2023: EF 50-55, no RWMA, GR 1 DD, mildly reduced RVSF, mild LAE, mild MR, AV sclerosis, RAP 3, average GLS -16.9   DISORDER, TOBACCO USE 06/27/2007   Qualifier: Diagnosis of  By: Areatha Ku MD, David     Dysrhythmia    Afib   Elevated liver function tests 10/04/2014   Fibromyalgia    GAD (generalized anxiety disorder) 10/10/2017   GERD (gastroesophageal reflux disease)    History of bronchitis    few yrs ago   History of cardiovascular stress test    ETT 05/09/23: normal   History of colon polyps    benign   History of migraine    last one over a month ago   Hyperlipidemia    takes Atorvastatin  daily   Hypertension    takes Clonidine  and HCTZ daily   Joint pain    Migraines    Muscle spasms of neck 06/06/2016    Neuromuscular disorder (HCC)    Neuropathy 04/25/2016   Nocturia    PAF (paroxysmal atrial fibrillation) (HCC) 09/17/2018   PONV (postoperative nausea and vomiting)    does ok with profolol   Recurrent boils 04/25/2016   Spinal stenosis of cervical region 10/25/2014   Stroke (HCC) 09/2015   Embolic Right ACA,  ILR placed Dr. 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   Dilated cardiomyopathy (HCC) 12/09/2019   Paroxysmal atrial fibrillation (HCC) 09/17/2018   GAD (generalized anxiety disorder) 10/10/2017   DDD (degenerative disc disease), lumbar 04/17/2017   Muscle spasms of neck 06/06/2016   Neuropathy 04/25/2016   Recurrent boils 04/25/2016   Hyperlipidemia 02/16/2016   Chronic migraine 02/08/2016   Essential hypertension 12/08/2015  Cryptogenic stroke (HCC) 10/02/2015   Chest mass 04/17/2015   Spinal stenosis of cervical region 10/25/2014   Elevated liver function tests 10/04/2014   Polyarthralgia 09/20/2014   Hidradenitis suppurativa 07/21/2014   Tear of medial meniscus of right knee 10/09/2013   Carpal tunnel syndrome 07/17/2013   HEADACHE 06/27/2007    Past Surgical History:  Procedure Laterality Date   ABDOMINAL HYSTERECTOMY  1997   ANTERIOR CERVICAL DECOMP/DISCECTOMY FUSION N/A 10/25/2014   Procedure: CERVICAL THREE-FOUR, CERVICAL FOUR FIVE ANTERIOR CERVICAL DECOMPRESSION/DISCECTOMY FUSION 2 LEVEL/HARDWARE REMOVALCERVICAL FIVE-SEVEN;  Surgeon: Ferris Hua,  MD;  Location: [facility];  Service: Neurosurgery;  Laterality: N/A;   BREAST DUCTAL SYSTEM EXCISION Left 11/14/2016   Procedure: EXCISION DUCTAL SYSTEM LEFT BREAST;  Surgeon: Lillette Reid III, MD;  Location: [facility];  Service: General;  Laterality: Left;   BREAST EXCISIONAL BIOPSY Right    benign   BREAST EXCISIONAL BIOPSY Left    benign   CARPAL TUNNEL RELEASE Left 10/25/2014   Procedure: CARPAL TUNNEL RELEASE;  Surgeon: Ferris Hua, MD;  Location: [facility];  Service: Neurosurgery;  Laterality: Left;   CARPAL TUNNEL RELEASE Right    CERVICAL FUSION  2007   x2   2012   CESAREAN SECTION  1985   CHEST SURGERY  04/2015   Benign chest mass removed- [facility]    CHOLECYSTECTOMY  1998   COLONOSCOPY     EP IMPLANTABLE DEVICE N/A 10/06/2015   Procedure: Loop Recorder Insertion;  Surgeon: Jolly Needle, MD;  Location: [facility];  Service: Cardiovascular;  Laterality: N/A;   ESOPHAGOGASTRODUODENOSCOPY N/A 09/05/2023   Procedure: ESOPHAGOGASTRODUODENOSCOPY (EGD);  Surgeon: Normie Becton., MD;  Location: [facility];  Service: Gastroenterology;  Laterality: N/A;   EUS N/A 09/05/2023   Procedure: UPPER ENDOSCOPIC ULTRASOUND (EUS) RADIAL;  Surgeon: Normie Becton., MD;  Location: [facility];  Service: Gastroenterology;  Laterality: N/A;   implantable loop recorder removal  08/10/2019   [employer] Reveal LINQ removed in office by Dr Nunzio Belch   KNEE ARTHROSCOPY Right 1999   KNEE ARTHROSCOPY WITH MEDIAL MENISECTOMY Right 10/09/2013   Procedure: RIGHT KNEE ARTHROSCOPY WITH MEDIAL MENISECTOMY;  Surgeon: Neville Barbone, MD;  Location: [facility];  Service: Orthopedics;  Laterality: Right;  clean   TEE WITHOUT CARDIOVERSION N/A 10/05/2015   Procedure: TRANSESOPHAGEAL ECHOCARDIOGRAM (TEE);  Surgeon: Lake Pilgrim, MD;  Location: [facility];  Service: Cardiovascular;  Laterality: N/A;   thumb surgery Right     OB History     Gravida  2   Para  1   Term   1   Preterm      AB  1   Living  1      SAB      IAB  1   Ectopic      Multiple      Live Births               Home Medications    Prior to Admission medications   Medication Sig Start Date End Date Taking? Authorizing Provider  acyclovir  (ZOVIRAX ) 400 MG tablet Take 1 tablet (400 mg total) by mouth 2 (two) times daily. 11/19/23   Zilphia Hilt, Charyl Coppersmith, MD  Antiseborrheic Products, Misc. (PROMISEB) CREA Apply 1 application topically as needed (for skin breakouts). 06/26/21   [provider]  atorvastatin  (LIPITOR) 40 MG tablet Take 1 tablet (40 mg total) by mouth daily. 11/19/23   Marlyse Single T, PA-C  baclofen   5 MG TABS Take 2 tablets (10 mg total) by mouth 2 (two) times daily as needed for muscle spasms. 02/05/23   Rising, Ivette Marks, PA-C  cloNIDine  (CATAPRES ) 0.1 MG tablet Take 1 tablet (0.1 mg total) by mouth 2 (two) times daily. 11/19/23   Marlyse Single T, PA-C  clotrimazole  (LOTRIMIN ) 1 % cream Apply 1 application topically 2 (two) times daily. To face 03/30/20   Mathis Som, MD  hydrochlorothiazide  (HYDRODIURIL ) 25 MG tablet Take 1 tablet (25 mg total) by mouth daily. 11/19/23   Marlyse Single T, PA-C  HYDROcodone -acetaminophen  (NORCO) 10-325 MG tablet Take 1 tablet by mouth every 8 (eight) hours as needed. Chronic Pain. Dx: G89.4 01/22/24   Zilphia Hilt, Charyl Coppersmith, MD  LORazepam  (ATIVAN ) 0.5 MG tablet Take 1 tablet (0.5 mg total) by mouth 2 (two) times daily as needed. for anxiety 09/17/18   Mathis Som, MD  losartan  (COZAAR ) 25 MG tablet Take 1 tablet (25 mg total) by mouth daily. 11/19/23   Marlyse Single T, PA-C  meloxicam  (MOBIC ) 7.5 MG tablet Take 1 tablet (7.5 mg total) by mouth daily. 01/22/24   Zilphia Hilt, Charyl Coppersmith, MD  omeprazole  (PRILOSEC) 40 MG capsule Take 1 capsule (40 mg total) by mouth daily. 11/19/23   Daina Drum, MD  potassium chloride  (KLOR-CON  M10) 10 MEQ tablet Take 1 tablet (10 mEq total) by mouth daily. 11/19/23   Marlyse Single T, PA-C  rivaroxaban  (XARELTO ) 20 MG TABS tablet TAKE 1 TABLET DAILY WITH   SUPPER 11/19/23   Marlyse Single T, PA-C  rivaroxaban  (XARELTO ) 20 MG TABS tablet Take 1 tablet (20 mg total) by mouth daily with supper. 11/19/23   Marlyse Single T, PA-C  triamcinolone  cream (KENALOG ) 0.1 % Apply 1 application topically 2 (two) times daily. 08/10/20   Mathis Som, MD    Family History Family History  Problem Relation Age of Onset   Hypertension Mother    Diabetes Mother    Colon cancer Maternal Grandmother    Cancer Maternal Grandmother        colon   Stroke Maternal Grandmother    Colon polyps Maternal Grandmother    Pancreatic cancer Neg Hx    Esophageal cancer Neg Hx    Stomach cancer Neg Hx     Social History Social History   Tobacco Use   Smoking status: Former    Current packs/day: 0.00    Types: Cigarettes    Quit date: 07/07/2014    Years since quitting: 9.6   Smokeless tobacco: Never   Tobacco comments:    may smoke 2 cigarettes per month  Vaping Use   Vaping status: Never Used  Substance Use Topics   Alcohol use: Not Currently   Drug use: No     Allergies   Azithromycin, Baclofen , Other, Ofloxacin, Penicillin g, Penicillins, and Spironolactone    Review of Systems Review of Systems  Skin:        Swollen area to the left wrist       Physical Exam Triage Vital Signs ED Triage Vitals  Encounter Vitals Group     BP 02/25/24 1851 123/80     Systolic BP Percentile --      Diastolic BP Percentile --      Pulse Rate 02/25/24 1851 81     Resp 02/25/24 1851 18     Temp 02/25/24 1851 98.7 F (37.1 C)     Temp Source 02/25/24 1851 Oral     SpO2 02/25/24 1851  95 %     Weight --      Height --      Head Circumference --      Peak Flow --      Pain Score 02/25/24 1850 0     Pain Loc --      Pain Education --      Exclude from Growth Chart --    No data found.  Updated Vital Signs BP 123/80 (BP Location: Right Arm)   Pulse 81   Temp 98.7 F  (37.1 C) (Oral)   Resp 18   SpO2 95%   Visual Acuity Right Eye Distance:   Left Eye Distance:   Bilateral Distance:    Right Eye Near:   Left Eye Near:    Bilateral Near:     Physical Exam Vitals reviewed.  Constitutional:      General: She is awake.     Appearance: Normal appearance. She is well-developed and well-groomed.  HENT:     Head: Normocephalic and atraumatic.  Musculoskeletal:     Left hand: Swelling present. No deformity, lacerations or tenderness. Normal range of motion. Normal strength. Normal capillary refill. Normal pulse.       Hands:     Comments: Patient has approx 2-3 cm diameter firm, mobile nodule along radial aspect of left wrist.  Area is tender to deep palpation Radial pulse is intact and brisk and there is normal cap refill to the thumb She has intact range of motion with regards to finger flexion, extension, abduction. She has intact range of motion with wrist movements with regards to flexion, extension, lateral flexion. There is no overlying warmth, redness, distal swelling present.  No signs of bruising, skin breakdown, bleeding or purulent drainage over the indicated area.  Neurological:     Mental Status: She is alert.  Psychiatric:        Behavior: Behavior is cooperative.      UC Treatments / Results  Labs (all labs ordered are listed, but only abnormal results are displayed) Labs Reviewed - No data to display  EKG   Radiology No results found.   Procedures Procedures (including critical care time)  Medications Ordered in UC Medications - No data to display  Initial Impression / Assessment and Plan / UC Course  I have reviewed the triage vital signs and the nursing notes.  Pertinent labs & imaging results that were available during my care of the patient were reviewed by me and considered in my medical decision making (see chart for details).      Final Clinical Impressions(s) / UC Diagnoses   Final diagnoses:   Ganglion cyst of volar aspect of left wrist   Patient presents today with concerns of a raised area on her left wrist.  She reports that this appeared suddenly earlier today and denies pain, discomfort, decreased range of motion or discoloration.  She is concerned about a potential clot or bleeding beneath the skin as she is on a blood thinner.  Physical exam appears most consistent with likely ganglion cyst but cannot rule out trauma or superficial thrombus.  I am less suspicious for the latter options as there is no signs of pain, increased warmth to the area, bruising, skin injury.  Reviewed with patient that ganglion cyst can sometimes resolve with warm compresses and massage but if symptoms are persisting she should follow-up with orthopedics for further evaluation and management.  Patient voiced agreement understanding with recommendations.  ED return precautions reviewed and  provided in after visit summary.  Follow-up as needed.    Discharge Instructions      You were seen today for concerns of swelling along your left wrist.  At this time your exam appears most consistent with a ganglion cyst.  These are typically benign formations that can resolve on their own but sometimes may need drainage by orthopedics. You can apply warm compresses to the area to help encourage reduction.  If the area starts to become bigger or you start to have some difficulty bending your wrist I recommend following up with orthopedics for evaluation and potential management.  If you start to have decreased range of motion of your wrist, swelling of the hands or fingers, redness, increased discomfort I recommend returning to urgent care or going to the emergency room for further evaluation and management.     ED Prescriptions   None    PDMP not reviewed this encounter.   Artemisia Auvil, Pearla Bottom, PA-C 02/28/24 1200

## 2024-02-25 NOTE — ED Triage Notes (Signed)
 Pt states that she was out walking and she had a "raised area" on her wrist/hand area. Pt is worried because she is on thinners. Pt states there is no pain at the site.

## 2024-02-25 NOTE — Discharge Instructions (Signed)
 You were seen today for concerns of swelling along your left wrist.  At this time your exam appears most consistent with a ganglion cyst.  These are typically benign formations that can resolve on their own but sometimes may need drainage by orthopedics. You can apply warm compresses to the area to help encourage reduction.  If the area starts to become bigger or you start to have some difficulty bending your wrist I recommend following up with orthopedics for evaluation and potential management.  If you start to have decreased range of motion of your wrist, swelling of the hands or fingers, redness, increased discomfort I recommend returning to urgent care or going to the emergency room for further evaluation and management.

## 2024-03-11 ENCOUNTER — Ambulatory Visit (INDEPENDENT_AMBULATORY_CARE_PROVIDER_SITE_OTHER): Payer: No Typology Code available for payment source | Admitting: Internal Medicine

## 2024-03-11 ENCOUNTER — Ambulatory Visit: Payer: Self-pay | Admitting: Internal Medicine

## 2024-03-11 ENCOUNTER — Encounter: Payer: Self-pay | Admitting: Internal Medicine

## 2024-03-11 VITALS — BP 120/80 | HR 91 | Temp 98.3°F | Ht 64.5 in | Wt 129.8 lb

## 2024-03-11 DIAGNOSIS — I480 Paroxysmal atrial fibrillation: Secondary | ICD-10-CM | POA: Diagnosis not present

## 2024-03-11 DIAGNOSIS — Z0000 Encounter for general adult medical examination without abnormal findings: Secondary | ICD-10-CM

## 2024-03-11 DIAGNOSIS — I10 Essential (primary) hypertension: Secondary | ICD-10-CM | POA: Diagnosis not present

## 2024-03-11 DIAGNOSIS — E559 Vitamin D deficiency, unspecified: Secondary | ICD-10-CM | POA: Insufficient documentation

## 2024-03-11 DIAGNOSIS — I5020 Unspecified systolic (congestive) heart failure: Secondary | ICD-10-CM

## 2024-03-11 DIAGNOSIS — R7989 Other specified abnormal findings of blood chemistry: Secondary | ICD-10-CM | POA: Diagnosis not present

## 2024-03-11 DIAGNOSIS — R1319 Other dysphagia: Secondary | ICD-10-CM

## 2024-03-11 DIAGNOSIS — Z780 Asymptomatic menopausal state: Secondary | ICD-10-CM

## 2024-03-11 DIAGNOSIS — E782 Mixed hyperlipidemia: Secondary | ICD-10-CM

## 2024-03-11 LAB — CBC WITH DIFFERENTIAL/PLATELET
Basophils Absolute: 0 10*3/uL (ref 0.0–0.1)
Basophils Relative: 0.5 % (ref 0.0–3.0)
Eosinophils Absolute: 0.1 10*3/uL (ref 0.0–0.7)
Eosinophils Relative: 1.3 % (ref 0.0–5.0)
HCT: 38.5 % (ref 36.0–46.0)
Hemoglobin: 13.2 g/dL (ref 12.0–15.0)
Lymphocytes Relative: 44.1 % (ref 12.0–46.0)
Lymphs Abs: 3.3 10*3/uL (ref 0.7–4.0)
MCHC: 34.2 g/dL (ref 30.0–36.0)
MCV: 97.7 fl (ref 78.0–100.0)
Monocytes Absolute: 0.6 10*3/uL (ref 0.1–1.0)
Monocytes Relative: 7.6 % (ref 3.0–12.0)
Neutro Abs: 3.5 10*3/uL (ref 1.4–7.7)
Neutrophils Relative %: 46.5 % (ref 43.0–77.0)
Platelets: 345 10*3/uL (ref 150.0–400.0)
RBC: 3.94 Mil/uL (ref 3.87–5.11)
RDW: 13.8 % (ref 11.5–15.5)
WBC: 7.5 10*3/uL (ref 4.0–10.5)

## 2024-03-11 LAB — COMPREHENSIVE METABOLIC PANEL WITH GFR
ALT: 13 U/L (ref 0–35)
AST: 19 U/L (ref 0–37)
Albumin: 4.3 g/dL (ref 3.5–5.2)
Alkaline Phosphatase: 80 U/L (ref 39–117)
BUN: 14 mg/dL (ref 6–23)
CO2: 25 meq/L (ref 19–32)
Calcium: 9.2 mg/dL (ref 8.4–10.5)
Chloride: 102 meq/L (ref 96–112)
Creatinine, Ser: 0.94 mg/dL (ref 0.40–1.20)
GFR: 63.01 mL/min (ref >60.00)
Glucose, Bld: 79 mg/dL (ref 70–99)
Potassium: 3.4 meq/L — ABNORMAL LOW (ref 3.5–5.1)
Sodium: 137 meq/L (ref 135–145)
Total Bilirubin: 0.7 mg/dL (ref 0.2–1.2)
Total Protein: 7.3 g/dL (ref 6.0–8.3)

## 2024-03-11 LAB — VITAMIN B12: Vitamin B-12: 234 pg/mL (ref 211–911)

## 2024-03-11 LAB — LIPID PANEL
Cholesterol: 160 mg/dL (ref 0–200)
HDL: 51.6 mg/dL (ref >39.00)
LDL Cholesterol: 84 mg/dL (ref 0–99)
NonHDL: 108.43
Total CHOL/HDL Ratio: 3
Triglycerides: 124 mg/dL (ref 0.0–149.0)
VLDL: 24.8 mg/dL (ref 0.0–40.0)

## 2024-03-11 LAB — TSH: TSH: 1.17 u[IU]/mL (ref 0.35–5.50)

## 2024-03-11 LAB — VITAMIN D 25 HYDROXY (VIT D DEFICIENCY, FRACTURES): VITD: 9.34 ng/mL — ABNORMAL LOW (ref 30.00–100.00)

## 2024-03-11 MED ORDER — ACYCLOVIR 400 MG PO TABS: 400.0000 mg | ORAL_TABLET | Freq: Two times a day (BID) | ORAL | 1 refills | Status: DC

## 2024-03-11 MED ORDER — VITAMIN D (ERGOCALCIFEROL) 1.25 MG (50000 UNIT) PO CAPS: 50000.0000 [IU] | ORAL_CAPSULE | ORAL | 0 refills | Status: DC

## 2024-03-11 NOTE — Addendum Note (Signed)
 Addended by: Nicolina Barrios B on: 03/11/2024 08:47 AM   Modules accepted: Orders

## 2024-03-11 NOTE — Progress Notes (Signed)
 Established Patient Office Visit     CC/Reason for Visit: Annual preventive exam and subsequent Medicare wellness visit  HPI: Alicia Bolton is a 68 y.o. female who is coming in today for the above mentioned reasons. Past Medical History is significant for: hypertension, hyperlipidemia, aortic atherosclerosis, she had a stroke in December 2016 with no large residual deficits.  She also has a history of paroxysmal atrial fibrillation anticoagulated on Xarelto , she also has heart failure with reduced ejection fraction with the most recent EF of around 45% and a dilated cardiomyopathy.  She follows with cardiology routinely.  She also had some kind of benign mass resected from her chest at [facility] about 5 years ago.  She is overdue for an eye exam and will schedule, has routine dental care.  Is due for pneumonia and shingles vaccines.  She had a mammogram at the breast center, has a DEXA scan already scheduled for July, had a colonoscopy in 2024.  She no longer does Pap smears as she has had a total abdominal hysterectomy.   Past Medical/Surgical History: Past Medical History:  Diagnosis Date   Anxiety    takes Ativan  daily as needed   Arthritis    Carpal tunnel syndrome 07/17/2013   Chest mass 04/17/2015   Chronic migraine 02/08/2016   Complication of anesthesia    DDD (degenerative disc disease), lumbar 04/17/2017   Dilated cardiomyopathy (HCC) 12/09/2019   EF 45-50 by echocardiogram // Myoview  11/2019: EF 46, no ischemia, mild apical thinning artifact; Low Risk // Echo 4/23: EF 45-50, no RWMA, GR 1 DD, GLS -15.2, normal RVSF, normal PASP, RVSP 25.3, mild MR, AV sclerosis without stenosis // TTE 04/09/2023: EF 50-55, no RWMA, GR 1 DD, mildly reduced RVSF, mild LAE, mild MR, AV sclerosis, RAP 3, average GLS -16.9   DISORDER, TOBACCO USE 06/27/2007   Qualifier: Diagnosis of  By: Areatha Ku MD, David     Dysrhythmia    Afib   Elevated liver function tests 10/04/2014   Fibromyalgia    GAD  (generalized anxiety disorder) 10/10/2017   GERD (gastroesophageal reflux disease)    History of bronchitis    few yrs ago   History of cardiovascular stress test    ETT 05/09/23: normal   History of colon polyps    benign   History of migraine    last one over a month ago   Hyperlipidemia    takes Atorvastatin  daily   Hypertension    takes Clonidine  and HCTZ daily   Joint pain    Migraines    Muscle spasms of neck 06/06/2016   Neuromuscular disorder (HCC)    Neuropathy 04/25/2016   Nocturia    PAF (paroxysmal atrial fibrillation) (HCC) 09/17/2018   PONV (postoperative nausea and vomiting)    does ok with profolol   Recurrent boils 04/25/2016   Spinal stenosis of cervical region 10/25/2014   Stroke (HCC) 09/2015   Embolic Right ACA,  ILR placed Dr. Nunzio Belch.States slight weakness in left   Tear of medial meniscus of right knee 10/09/2013    Past Surgical History:  Procedure Laterality Date   ABDOMINAL HYSTERECTOMY  1997   ANTERIOR CERVICAL DECOMP/DISCECTOMY FUSION N/A 10/25/2014   Procedure: CERVICAL THREE-FOUR, CERVICAL FOUR FIVE ANTERIOR CERVICAL DECOMPRESSION/DISCECTOMY FUSION 2 LEVEL/HARDWARE REMOVALCERVICAL FIVE-SEVEN;  Surgeon: Ferris Hua, MD;  Location: [facility];  Service: Neurosurgery;  Laterality: N/A;   BREAST DUCTAL SYSTEM EXCISION Left 11/14/2016   Procedure: EXCISION DUCTAL SYSTEM LEFT BREAST;  Surgeon:  Lillette Reid III, MD;  Location: [facility] OR;  Service: General;  Laterality: Left;   BREAST EXCISIONAL BIOPSY Right    benign   BREAST EXCISIONAL BIOPSY Left    benign   CARPAL TUNNEL RELEASE Left 10/25/2014   Procedure: CARPAL TUNNEL RELEASE;  Surgeon: Ferris Hua, MD;  Location: [facility];  Service: Neurosurgery;  Laterality: Left;   CARPAL TUNNEL RELEASE Right    CERVICAL FUSION  2007   x2   2012   CESAREAN SECTION  1985   CHEST SURGERY  04/2015   Benign chest mass removed- [facility]    CHOLECYSTECTOMY  1998   COLONOSCOPY     EP IMPLANTABLE DEVICE N/A  10/06/2015   Procedure: Loop Recorder Insertion;  Surgeon: Jolly Needle, MD;  Location: [facility];  Service: Cardiovascular;  Laterality: N/A;   ESOPHAGOGASTRODUODENOSCOPY N/A 09/05/2023   Procedure: ESOPHAGOGASTRODUODENOSCOPY (EGD);  Surgeon: Normie Becton., MD;  Location: [facility];  Service: Gastroenterology;  Laterality: N/A;   EUS N/A 09/05/2023   Procedure: UPPER ENDOSCOPIC ULTRASOUND (EUS) RADIAL;  Surgeon: Normie Becton., MD;  Location: [facility];  Service: Gastroenterology;  Laterality: N/A;   implantable loop recorder removal  08/10/2019   Medtronic Reveal LINQ removed in office by Dr Nunzio Belch   KNEE ARTHROSCOPY Right 1999   KNEE ARTHROSCOPY WITH MEDIAL MENISECTOMY Right 10/09/2013   Procedure: RIGHT KNEE ARTHROSCOPY WITH MEDIAL MENISECTOMY;  Surgeon: Neville Barbone, MD;  Location: [facility];  Service: Orthopedics;  Laterality: Right;  clean   TEE WITHOUT CARDIOVERSION N/A 10/05/2015   Procedure: TRANSESOPHAGEAL ECHOCARDIOGRAM (TEE);  Surgeon: Lake Pilgrim, MD;  Location: [facility] ENDOSCOPY;  Service: Cardiovascular;  Laterality: N/A;   thumb surgery Right     Social History:  reports that she quit smoking about 9 years ago. Her smoking use included cigarettes. She has never used smokeless tobacco. She reports that she does not currently use alcohol. She reports that she does not use drugs.  Allergies: Allergies  Allergen Reactions   Azithromycin Nausea And Vomiting   Baclofen  Nausea And Vomiting   Other     "Not supposed to take Tylenol  or ASA"   Ofloxacin Nausea And Vomiting and Rash    Makes sick to stomach and vomit  Other Reaction(s): GI Intolerance   Penicillin G Rash    Other reaction(s): Unknown  Other Reaction(s): GI Intolerance   Penicillins Rash    Has patient had a PCN reaction causing immediate rash, facial/tongue/throat swelling, SOB or lightheadedness with hypotension: No Has patient had a PCN reaction causing  severe rash involving mucus membranes or skin necrosis: No Has patient had a PCN reaction that required hospitalization No Has patient had a PCN reaction occurring within the last 10 years: No If all of the above answers are "NO", then may proceed with Cephalosporin use.    Spironolactone      BP increased; Headaches    Family History:  Family History  Problem Relation Age of Onset   Hypertension Mother    Diabetes Mother    Colon cancer Maternal Grandmother    Cancer Maternal Grandmother        colon   Stroke Maternal Grandmother    Colon polyps Maternal Grandmother    Pancreatic cancer Neg Hx    Esophageal cancer Neg Hx    Stomach cancer Neg Hx      Current Outpatient Medications:    acyclovir  (ZOVIRAX ) 400 MG tablet, Take 1 tablet (400 mg total) by  mouth 2 (two) times daily., Disp: 180 tablet, Rfl: 1   Antiseborrheic Products, Misc. (PROMISEB) CREA, Apply 1 application topically as needed (for skin breakouts)., Disp: , Rfl:    atorvastatin  (LIPITOR) 40 MG tablet, Take 1 tablet (40 mg total) by mouth daily., Disp: 90 tablet, Rfl: 3   baclofen  5 MG TABS, Take 2 tablets (10 mg total) by mouth 2 (two) times daily as needed for muscle spasms., Disp: 15 tablet, Rfl: 0   cloNIDine  (CATAPRES ) 0.1 MG tablet, Take 1 tablet (0.1 mg total) by mouth 2 (two) times daily., Disp: 180 tablet, Rfl: 3   clotrimazole  (LOTRIMIN ) 1 % cream, Apply 1 application topically 2 (two) times daily. To face, Disp: 30 g, Rfl: 1   hydrochlorothiazide  (HYDRODIURIL ) 25 MG tablet, Take 1 tablet (25 mg total) by mouth daily., Disp: 90 tablet, Rfl: 3   HYDROcodone -acetaminophen  (NORCO) 10-325 MG tablet, Take 1 tablet by mouth every 8 (eight) hours as needed. Chronic Pain. Dx: G89.4, Disp: 21 tablet, Rfl: 0   LORazepam  (ATIVAN ) 0.5 MG tablet, Take 1 tablet (0.5 mg total) by mouth 2 (two) times daily as needed. for anxiety, Disp: 30 tablet, Rfl: 1   losartan  (COZAAR ) 25 MG tablet, Take 1 tablet (25 mg total) by mouth  daily., Disp: 90 tablet, Rfl: 3   meloxicam  (MOBIC ) 7.5 MG tablet, Take 1 tablet (7.5 mg total) by mouth daily., Disp: 30 tablet, Rfl: 0   omeprazole  (PRILOSEC) 40 MG capsule, Take 1 capsule (40 mg total) by mouth daily., Disp: 90 capsule, Rfl: 1   potassium chloride  (KLOR-CON  M10) 10 MEQ tablet, Take 1 tablet (10 mEq total) by mouth daily., Disp: 90 tablet, Rfl: 3   rivaroxaban  (XARELTO ) 20 MG TABS tablet, TAKE 1 TABLET DAILY WITH   SUPPER, Disp: 14 tablet, Rfl: 0   rivaroxaban  (XARELTO ) 20 MG TABS tablet, Take 1 tablet (20 mg total) by mouth daily with supper., Disp: 90 tablet, Rfl: 3   triamcinolone  cream (KENALOG ) 0.1 %, Apply 1 application topically 2 (two) times daily., Disp: 30 g, Rfl: 1  Review of Systems:  Negative unless indicated in HPI.   Physical Exam: Vitals:   03/11/24 0659  BP: 120/80  Pulse: 91  Temp: 98.3 F (36.8 C)  TempSrc: Oral  SpO2: 98%  Weight: 129 lb 12.8 oz (58.9 kg)  Height: 5' 4.5" (1.638 m)    Body mass index is 21.94 kg/m.   Physical Exam Vitals reviewed.  Constitutional:      General: She is not in acute distress.    Appearance: Normal appearance. She is not ill-appearing, toxic-appearing or diaphoretic.  HENT:     Head: Normocephalic.     Right Ear: Tympanic membrane, ear canal and external ear normal. There is no impacted cerumen.     Left Ear: Tympanic membrane, ear canal and external ear normal. There is no impacted cerumen.     Nose: Nose normal.     Mouth/Throat:     Mouth: Mucous membranes are moist.     Pharynx: Oropharynx is clear. No oropharyngeal exudate or posterior oropharyngeal erythema.  Eyes:     General: No scleral icterus.       Right eye: No discharge.        Left eye: No discharge.     Conjunctiva/sclera: Conjunctivae normal.     Pupils: Pupils are equal, round, and reactive to light.  Neck:     Vascular: No carotid bruit.  Cardiovascular:     Rate and Rhythm: Normal rate  and regular rhythm.     Pulses: Normal  pulses.     Heart sounds: Normal heart sounds.  Pulmonary:     Effort: Pulmonary effort is normal. No respiratory distress.     Breath sounds: Normal breath sounds.  Abdominal:     General: Abdomen is flat. Bowel sounds are normal.     Palpations: Abdomen is soft.  Musculoskeletal:        General: Normal range of motion.     Cervical back: Normal range of motion.  Skin:    General: Skin is warm and dry.  Neurological:     General: No focal deficit present.     Mental Status: She is alert and oriented to person, place, and time. Mental status is at baseline.  Psychiatric:        Mood and Affect: Mood normal.        Behavior: Behavior normal.        Thought Content: Thought content normal.        Judgment: Judgment normal.    Subsequent Medicare wellness visit   1. Risk factors, based on past  M,S,F - Cardiac Risk Factors include: advanced age (>19men, >64 women);hypertension   2.  Physical activities: Dietary issues and exercise activities discussed:      3.  Depression/mood:  Flowsheet Row Office Visit from 03/12/2023 in [facility] Total Score 2        4.  ADL's:    03/10/2024    5:41 AM  In your present state of health, do you have any difficulty performing the following activities:  Hearing? 0  Vision? 0  Difficulty concentrating or making decisions? 0  Walking or climbing stairs? 0  Dressing or bathing? 0  Doing errands, shopping? 0  Preparing Food and eating ? N  Using the Toilet? N  In the past six months, have you accidently leaked urine? N  Do you have problems with loss of bowel control? N  Managing your Medications? N  Managing your Finances? N     5.  Fall risk:     12/07/2021    8:52 AM 06/05/2022    8:37 AM 03/12/2023    8:38 AM 01/22/2024    7:56 AM 03/11/2024    7:00 AM  Fall Risk  Falls in the past year? 0  0 0 0  Was there an injury with Fall? 0  0 0 0  Fall Risk Category Calculator 0  0 0 0  Fall Risk  Category (Retired) Low      (RETIRED) Patient Fall Risk Level  Low fall risk     Fall risk Follow up Falls evaluation completed  Falls evaluation completed Falls evaluation completed Falls evaluation completed     6.  Home safety: No problems identified   7.  Height weight, and visual acuity: height and weight as above, vision/hearing: Vision Screening   Right eye Left eye Both eyes  Without correction 20/25 20/25 20/25   With correction        8.  Counseling: Counseling given: Not Answered Tobacco comments: may smoke 2 cigarettes per month    9. Lab orders based on risk factors: Laboratory update will be reviewed   10. Cognitive assessment:        03/11/2024    7:00 AM 03/12/2023    8:18 AM  6CIT Screen  What Year? 0 points 0 points  What month? 0 points 0 points  What time? 0  points 0 points  Count back from 20 0 points 0 points  Months in reverse 0 points 0 points  Repeat phrase 0 points 0 points  Total Score 0 points 0 points     11. Screening: Patient provided with a written and personalized 5-10 year screening schedule in the AVS. Health Maintenance  Topic Date Due   Pneumonia Vaccine (1 of 2 - PCV) Never done   Zoster (Shingles) Vaccine (1 of 2) Never done   DEXA scan (bone density measurement)  Never done   COVID-19 Vaccine (5 - 2024-25 season) 06/30/2023   Colon Cancer Screening  06/25/2024   Flu Shot  05/29/2024   Medicare Annual Wellness Visit  03/11/2025   Mammogram  02/23/2026   DTaP/Tdap/Td vaccine (3 - Td or Tdap) 06/17/2033   Hepatitis C Screening  Completed   HPV Vaccine  Aged Out   Meningitis B Vaccine  Aged Out    12. Provider List Update: Patient Care Team    Relationship Specialty Notifications Start End  Zilphia Hilt, Charyl Coppersmith, MD PCP - General Internal Medicine  04/20/21   Jann Melody, MD PCP - Cardiology Cardiology  04/23/23   Sherwood Donath Physician Assistant Cardiology  04/14/21      13. Advance  Directives: Does Patient Have a Medical Advance Directive?: No Would patient like information on creating a medical advance directive?: No - Patient declined  14. Opioids: Patient is not on any opioid prescriptions and has no risk factors for a substance use disorder.   15.   Goals      Activity and Exercise Increased     Evidence-based guidance:  Review current exercise levels.  Assess patient perspective on exercise or activity level, barriers to increasing activity, motivation and readiness for change.  Recommend or set healthy exercise goal based on individual tolerance.  Encourage small steps toward making change in amount of exercise or activity.  Urge reduction of sedentary activities or screen time.  Promote group activities within the community or with family or support person.  Consider referral to rehabiliation therapist for assessment and exercise/activity plan.   Notes:      Protect My Health     Timeframe:  Long-Range Goal Priority:  Medium Start Date:                             Expected End Date:                       Follow Up Date 03/11/2024    - schedule and keep appointment for annual check-up    Why is this important?   Screening tests can find diseases early when they are easier to treat.  Your doctor or nurse will talk with you about which tests are important for you.  Getting shots for common diseases like the flu and shingles will help prevent them.     Notes:          I have personally reviewed and noted the following in the patient's chart:   Medical and social history Use of alcohol, tobacco or illicit drugs  Current medications and supplements Functional ability and status Nutritional status Physical activity Advanced directives List of other physicians Hospitalizations, surgeries, and ER visits in previous 12 months Vitals Screenings to include cognitive, depression, and falls Referrals and appointments  In addition, I have  reviewed and discussed with patient certain preventive protocols,  quality metrics, and best practice recommendations. A written personalized care plan for preventive services as well as general preventive health recommendations were provided to patient.   Impression and Plan:  Mixed hyperlipidemia -     Lipid panel; Future  Primary hypertension -     CBC with Differential/Platelet; Future -     Comprehensive metabolic panel with GFR; Future  Paroxysmal atrial fibrillation (HCC) -     TSH; Future  Elevated liver function tests  HFrEF (heart failure with reduced ejection fraction) (HCC) -     Vitamin B12; Future -     VITAMIN D 25 Hydroxy (Vit-D Deficiency, Fractures); Future  Esophageal dysphagia  Postmenopausal estrogen deficiency    -Recommend routine eye and dental care. -Healthy lifestyle discussed in detail. -Labs to be updated today. -Prostate cancer screening: Not applicable Health Maintenance  Topic Date Due   Pneumonia Vaccine (1 of 2 - PCV) Never done   Zoster (Shingles) Vaccine (1 of 2) Never done   DEXA scan (bone density measurement)  Never done   COVID-19 Vaccine (5 - 2024-25 season) 06/30/2023   Colon Cancer Screening  06/25/2024   Flu Shot  05/29/2024   Medicare Annual Wellness Visit  03/11/2025   Mammogram  02/23/2026   DTaP/Tdap/Td vaccine (3 - Td or Tdap) 06/17/2033   Hepatitis C Screening  Completed   HPV Vaccine  Aged Out   Meningitis B Vaccine  Aged Out    - Declines shingles and pneumonia vaccines today, will obtain at a later date.     Marguerita Shih, MD [facility]

## 2024-03-13 ENCOUNTER — Encounter: Payer: Self-pay | Admitting: Internal Medicine

## 2024-03-13 DIAGNOSIS — E559 Vitamin D deficiency, unspecified: Secondary | ICD-10-CM

## 2024-04-02 DIAGNOSIS — M65341 Trigger finger, right ring finger: Secondary | ICD-10-CM | POA: Diagnosis not present

## 2024-04-02 DIAGNOSIS — M65322 Trigger finger, left index finger: Secondary | ICD-10-CM | POA: Diagnosis not present

## 2024-04-06 ENCOUNTER — Encounter: Payer: Self-pay | Admitting: Podiatry

## 2024-04-06 ENCOUNTER — Ambulatory Visit (INDEPENDENT_AMBULATORY_CARE_PROVIDER_SITE_OTHER): Admitting: Podiatry

## 2024-04-06 DIAGNOSIS — D492 Neoplasm of unspecified behavior of bone, soft tissue, and skin: Secondary | ICD-10-CM

## 2024-04-06 DIAGNOSIS — D2372 Other benign neoplasm of skin of left lower limb, including hip: Secondary | ICD-10-CM | POA: Diagnosis not present

## 2024-04-06 NOTE — Progress Notes (Signed)
  Subjective:  Patient ID: Alicia Bolton, female    DOB: 1956/09/24,   MRN: [id number]  Chief Complaint  Patient presents with   Callouses    Rm23/ Callous left foot plantar/not diabetic/3 months /pain with pressure and walking    68 y.o. female presents for concern of lesion on the left foot that has ben present for about three months. Relates a lot of pain with walking. Denies any treatments. . Denies diabetes . Denies any other pedal complaints. Denies n/v/f/c.   Past Medical History:  Diagnosis Date   Anxiety    takes Ativan  daily as needed   Arthritis    Carpal tunnel syndrome 07/17/2013   Chest mass 04/17/2015   Chronic migraine 02/08/2016   Complication of anesthesia    DDD (degenerative disc disease), lumbar 04/17/2017   Dilated cardiomyopathy (HCC) 12/09/2019   EF 45-50 by echocardiogram // Myoview  11/2019: EF 46, no ischemia, mild apical thinning artifact; Low Risk // Echo 4/23: EF 45-50, no RWMA, GR 1 DD, GLS -15.2, normal RVSF, normal PASP, RVSP 25.3, mild MR, AV sclerosis without stenosis // TTE 04/09/2023: EF 50-55, no RWMA, GR 1 DD, mildly reduced RVSF, mild LAE, mild MR, AV sclerosis, RAP 3, average GLS -16.9   DISORDER, TOBACCO USE 06/27/2007   Qualifier: Diagnosis of  By: Areatha Ku MD, David     Dysrhythmia    Afib   Elevated liver function tests 10/04/2014   Fibromyalgia    GAD (generalized anxiety disorder) 10/10/2017   GERD (gastroesophageal reflux disease)    History of bronchitis    few yrs ago   History of cardiovascular stress test    ETT 05/09/23: normal   History of colon polyps    benign   History of migraine    last one over a month ago   Hyperlipidemia    takes Atorvastatin  daily   Hypertension    takes Clonidine  and HCTZ daily   Joint pain    Migraines    Muscle spasms of neck 06/06/2016   Neuromuscular disorder (HCC)    Neuropathy 04/25/2016   Nocturia    PAF (paroxysmal atrial fibrillation) (HCC) 09/17/2018   PONV (postoperative nausea and  vomiting)    does ok with profolol   Recurrent boils 04/25/2016   Spinal stenosis of cervical region 10/25/2014   Stroke (HCC) 09/2015   Embolic Right ACA,  ILR placed Dr. Nunzio Belch.States slight weakness in left   Tear of medial meniscus of right knee 10/09/2013    Objective:  Physical Exam: Vascular: DP/PT pulses 2/4 bilateral. CFT <3 seconds. Normal hair growth on digits. No edema.  Skin. No lacerations or abrasions bilateral feet. Plantar third metatarsal head on the left with cored hyperkeratotic lesion with disruption of skin lines.  Musculoskeletal: MMT 5/5 bilateral lower extremities in DF, PF, Inversion and Eversion. Deceased ROM in DF of ankle joint.  Neurological: Sensation intact to light touch.   Assessment:   1. Skin neoplasm      Plan:  Patient was evaluated and treated and all questions answered. -Discussed benign skin lesions with patient and treatment options.  -Hyperkeratotic tissue was debrided with chisel without incident.  -Applied salycylic acid treatment to area with dressing. Advised to remove bandaging tomorrow.  -Encouraged daily moisturizing -Discussed use of pumice stone -Advised good supportive shoes and inserts -Patient to return to office as needed or sooner if condition worsens.    Jennefer Moats, DPM

## 2024-04-28 ENCOUNTER — Encounter: Payer: Self-pay | Admitting: Internal Medicine

## 2024-05-20 ENCOUNTER — Other Ambulatory Visit: Payer: Self-pay | Admitting: Physician Assistant

## 2024-05-20 ENCOUNTER — Other Ambulatory Visit: Payer: Self-pay | Admitting: Internal Medicine

## 2024-05-21 ENCOUNTER — Encounter: Payer: Self-pay | Admitting: *Deleted

## 2024-05-24 NOTE — Assessment & Plan Note (Signed)
 Creatinine clearance 54 mL/min.

## 2024-05-24 NOTE — Progress Notes (Signed)
 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 Echocardiogram 09/2019: EF 45-50, GLS -20.4 Myoview  2/21: EF 46, no ischemia; low risk Intol of Spironolactone  (BP); beta-blocker (hair loss) Echo 01/30/2022: EF 45-50, GLS -15.2 TTE 04/09/2023: EF 50-55, no RWMA, GR 1 DD, mild reduced RVSF, mild LAE, mild MR, AV sclerosis, RAP 3, average GLS -16.9 ETT 05/08/23: neg for ischemia; no arrhythmias  Hyperlipidemia Hypertension 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 History of Present Illness Alicia Bolton is a 68 y.o. female who returns for follow up of AFib, cardiomyopathy. She was last seen in 10/2023.   She is here alone.  She has not had chest pain, shortness of breath, or feeling out of rhythm. She adheres to her medication regimen and feels well overall. She is currently taking Xarelto  20 mg daily for anticoagulation with no bleeding issues or easy bruising. Her blood pressure has been stable at home.  ROS-See HPI    Studies Reviewed:  EKG Interpretation Date/Time:  Monday May 25 2024 08:22:39 EDT Ventricular Rate:  77 PR Interval:  144 QRS Duration:  78 QT Interval:  384 QTC Calculation: 434 R Axis:   -29  Text Interpretation: Normal sinus rhythm with sinus arrhythmia Minimal voltage criteria for LVH, may be normal variant No significant change when  compared to multiple prior ECGs Confirmed by Lelon Glendia [id number]) on 05/25/2024 8:43:44 AM    Recent Labs    03/11/24 0728  K 3.4*  CREATININE 0.94  ALT 13  LDLCALC 84  HDL 51.60  TRIG 124.0  HGB 13.2    CrCl: 54 Risk Assessment/Calculations: CHA2DS2-VASc Score = 7   This indicates a 11.2% annual risk of stroke. The patient's score is based upon: CHF History: 1 HTN History: 1 Diabetes History: 0 Stroke History: 2 Vascular Disease History: 1 Age Score: 1 Gender Score: 1            Physical Exam:  VS:  BP 120/78   Pulse 77   Ht 7' (2.134 m)   Wt 133 lb 9.6 oz (60.6 kg)   SpO2 97%   BMI 13.31 kg/m        Wt Readings from Last 3 Encounters:  05/25/24 133 lb 9.6 oz (60.6 kg)  03/11/24 129 lb 12.8 oz (58.9 kg)  01/22/24 128 lb 1.6 oz (58.1 kg)    Constitutional:      Appearance: Healthy appearance. Not in distress.  Neck:     Vascular: JVD normal.  Pulmonary:     Breath sounds: Normal breath sounds. No wheezing. No rales.  Cardiovascular:     Normal rate. Regular rhythm.     Murmurs: There is no murmur.  Edema:    Peripheral edema absent.  Abdominal:     Palpations: Abdomen is soft.       Assessment and Plan:    Assessment & Plan Paroxysmal atrial fibrillation (HCC)  Creatinine clearance 54 mL/min. Maintaining sinus rhythm.  She is tolerating Xarelto  without bleeding issues or bruising.  - Continue Rivaroxaban  20 mg oral once daily Dilated cardiomyopathy (HCC) EF was previously 45-50.  This improved to 50-55 on most recent echocardiogram June 2024.  She was previously intolerant to beta blockers due to hair loss. - Continue Losartan  25 mg oral once daily Essential hypertension Blood pressure is well-controlled with current medication regimen. She monitors her blood pressure at home and reports it being stable. - Continue Clonidine  0.1 mg oral twice daily - Continue Hydrochlorothiazide  25 mg oral once daily - Continue losartan  25 mg daily         Dispo:  Return in about 1 year (around 05/25/2025) for Routine Follow Up, w/ Glendia Ferrier, PA-C.  Signed, Glendia Ferrier, PA-C

## 2024-05-25 ENCOUNTER — Encounter: Payer: Self-pay | Admitting: Physician Assistant

## 2024-05-25 ENCOUNTER — Telehealth: Payer: Self-pay | Admitting: *Deleted

## 2024-05-25 ENCOUNTER — Other Ambulatory Visit: Payer: Self-pay

## 2024-05-25 ENCOUNTER — Ambulatory Visit: Payer: Medicare (Managed Care) | Attending: Physician Assistant | Admitting: Physician Assistant

## 2024-05-25 VITALS — BP 120/78 | HR 77 | Ht >= 80 in | Wt 133.6 lb

## 2024-05-25 DIAGNOSIS — I420 Dilated cardiomyopathy: Secondary | ICD-10-CM | POA: Diagnosis not present

## 2024-05-25 DIAGNOSIS — I480 Paroxysmal atrial fibrillation: Secondary | ICD-10-CM | POA: Diagnosis not present

## 2024-05-25 DIAGNOSIS — I10 Essential (primary) hypertension: Secondary | ICD-10-CM

## 2024-05-25 MED ORDER — RIVAROXABAN 20 MG PO TABS: 20.0000 mg | ORAL_TABLET | Freq: Every day | ORAL | Status: DC

## 2024-05-25 NOTE — Patient Instructions (Signed)
 Medication Instructions:  Your physician recommends that you continue on your current medications as directed. Please refer to the Current Medication list given to you today.  *If you need a refill on your cardiac medications before your next appointment, please call your pharmacy*  Lab Work: None ordered  If you have labs (blood work) drawn today and your tests are completely normal, you will receive your results only by: MyChart Message (if you have MyChart) OR A paper copy in the mail If you have any lab test that is abnormal or we need to change your treatment, we will call you to review the results.  Testing/Procedures: None ordered  Follow-Up: At [facility], you and your health needs are our priority.  As part of our continuing mission to provide you with exceptional heart care, our providers are all part of one team.  This team includes your primary Cardiologist (physician) and Advanced Practice Providers or APPs (Physician Assistants and Nurse Practitioners) who all work together to provide you with the care you need, when you need it.  Your next appointment:   1 year(s)  Provider:   Glendia Ferrier, PA-C          We recommend signing up for the patient portal called MyChart.  Sign up information is provided on this After Visit Summary.  MyChart is used to connect with patients for Virtual Visits (Telemedicine).  Patients are able to view lab/test results, encounter notes, upcoming appointments, etc.  Non-urgent messages can be sent to your provider as well.   To learn more about what you can do with MyChart, go to [email or web address].   Other Instructions

## 2024-05-25 NOTE — Assessment & Plan Note (Signed)
 Blood pressure is well-controlled with current medication regimen. She monitors her blood pressure at home and reports it being stable. - Continue Clonidine  0.1 mg oral twice daily - Continue Hydrochlorothiazide  25 mg oral once daily - Continue losartan  25 mg daily

## 2024-05-25 NOTE — Telephone Encounter (Signed)
 Pt being seen in clinic today and is asking for 2 weeks of samples, see Glendia Ferrier, PA's message:  She was asking for 2 weeks of Xarelto . The cost went up and she had to wait to fill. She may have a week or 2 she will be without.

## 2024-05-25 NOTE — Assessment & Plan Note (Signed)
 EF was previously 45-50.  This improved to 50-55 on most recent echocardiogram June 2024.  She was previously intolerant to beta blockers due to hair loss. - Continue Losartan  25 mg oral once daily

## 2024-05-26 ENCOUNTER — Other Ambulatory Visit: Payer: No Typology Code available for payment source

## 2024-05-28 ENCOUNTER — Other Ambulatory Visit: Payer: Self-pay

## 2024-05-28 MED ORDER — RIVAROXABAN 20 MG PO TABS: 20.0000 mg | ORAL_TABLET | Freq: Every day | ORAL | 1 refills | Status: AC

## 2024-05-28 NOTE — Telephone Encounter (Signed)
 Received faxed refill request from [facility] mail order for Xarelto .  Pt last saw Glendia Ferrier, PA on 05/25/24, last labs 03/11/24 Creat 0.94, age 68, weight 60.6kg, CrCl 55.56, based on CrCl pt is on appropriate dosage of Xarelto  20mg  every day for afib.  Will refill rx.

## 2024-06-01 ENCOUNTER — Ambulatory Visit: Payer: Self-pay | Admitting: Internal Medicine

## 2024-06-01 ENCOUNTER — Other Ambulatory Visit (INDEPENDENT_AMBULATORY_CARE_PROVIDER_SITE_OTHER)

## 2024-06-01 DIAGNOSIS — E559 Vitamin D deficiency, unspecified: Secondary | ICD-10-CM | POA: Diagnosis not present

## 2024-06-01 LAB — VITAMIN D 25 HYDROXY (VIT D DEFICIENCY, FRACTURES): VITD: 38.85 ng/mL (ref 30.00–100.00)

## 2024-06-10 ENCOUNTER — Other Ambulatory Visit: Payer: Self-pay | Admitting: Internal Medicine

## 2024-06-10 DIAGNOSIS — E559 Vitamin D deficiency, unspecified: Secondary | ICD-10-CM

## 2024-08-05 ENCOUNTER — Other Ambulatory Visit: Payer: Self-pay | Admitting: Internal Medicine

## 2024-08-13 ENCOUNTER — Encounter: Payer: Self-pay | Admitting: Internal Medicine

## 2024-08-25 ENCOUNTER — Other Ambulatory Visit

## 2024-08-25 ENCOUNTER — Encounter: Payer: Self-pay | Admitting: Internal Medicine

## 2024-08-25 NOTE — Telephone Encounter (Signed)
 Spoke to the patient and told her to take OTC vit D and to schedule a follow up with Dr Theophilus in 3 months.

## 2024-10-16 ENCOUNTER — Encounter: Payer: Self-pay | Admitting: Internal Medicine

## 2024-10-27 ENCOUNTER — Other Ambulatory Visit: Payer: Self-pay | Admitting: Internal Medicine

## 2024-10-30 ENCOUNTER — Encounter: Payer: Self-pay | Admitting: Internal Medicine

## 2024-11-16 ENCOUNTER — Other Ambulatory Visit: Payer: Self-pay | Admitting: Internal Medicine

## 2024-11-19 NOTE — Telephone Encounter (Signed)
 Potassium outside Normal Range on 03/11/24  In accordance with refill protocols, please review and address the following requirements before this medication refill can be authorized:  Labs

## 2024-12-05 ENCOUNTER — Ambulatory Visit (HOSPITAL_BASED_OUTPATIENT_CLINIC_OR_DEPARTMENT_OTHER)

## 2024-12-23 ENCOUNTER — Other Ambulatory Visit (HOSPITAL_BASED_OUTPATIENT_CLINIC_OR_DEPARTMENT_OTHER)
